# Patient Record
Sex: Female | Born: 1975
Health system: Southern US, Community
[De-identification: ages and names within clinical notes are randomized; demographics above are authoritative.]

## PROBLEM LIST (undated history)

## (undated) DIAGNOSIS — Z923 Personal history of irradiation: Secondary | ICD-10-CM

## (undated) DIAGNOSIS — B20 Human immunodeficiency virus [HIV] disease: Secondary | ICD-10-CM

## (undated) DIAGNOSIS — Z124 Encounter for screening for malignant neoplasm of cervix: Secondary | ICD-10-CM

## (undated) DIAGNOSIS — R87619 Unspecified abnormal cytological findings in specimens from cervix uteri: Secondary | ICD-10-CM

## (undated) DIAGNOSIS — Z21 Asymptomatic human immunodeficiency virus [HIV] infection status: Secondary | ICD-10-CM

## (undated) DIAGNOSIS — IMO0002 Reserved for concepts with insufficient information to code with codable children: Secondary | ICD-10-CM

## (undated) DIAGNOSIS — D649 Anemia, unspecified: Secondary | ICD-10-CM

## (undated) DIAGNOSIS — I10 Essential (primary) hypertension: Secondary | ICD-10-CM

## (undated) DIAGNOSIS — N189 Chronic kidney disease, unspecified: Secondary | ICD-10-CM

## (undated) HISTORY — DX: Unspecified abnormal cytological findings in specimens from cervix uteri: R87.619

## (undated) HISTORY — PX: TUBAL LIGATION: SHX77

## (undated) HISTORY — PX: COSMETIC SURGERY: SHX468

## (undated) HISTORY — DX: Encounter for screening for malignant neoplasm of cervix: Z12.4

## (undated) HISTORY — DX: Asymptomatic human immunodeficiency virus (hiv) infection status: Z21

## (undated) HISTORY — DX: Anemia, unspecified: D64.9

## (undated) HISTORY — DX: Human immunodeficiency virus (HIV) disease: B20

## (undated) HISTORY — DX: Reserved for concepts with insufficient information to code with codable children: IMO0002

## (undated) HISTORY — DX: Chronic kidney disease, unspecified: N18.9

---

## 2003-04-03 ENCOUNTER — Inpatient Hospital Stay (HOSPITAL_COMMUNITY): Admission: AD | Admit: 2003-04-03 | Discharge: 2003-04-03 | Payer: Self-pay | Admitting: Obstetrics & Gynecology

## 2004-10-01 ENCOUNTER — Ambulatory Visit: Payer: Self-pay | Admitting: Obstetrics and Gynecology

## 2004-11-07 ENCOUNTER — Ambulatory Visit: Payer: Self-pay | Admitting: Obstetrics and Gynecology

## 2004-11-07 ENCOUNTER — Other Ambulatory Visit: Admission: RE | Admit: 2004-11-07 | Discharge: 2004-11-07 | Payer: Self-pay | Admitting: Obstetrics and Gynecology

## 2005-07-29 ENCOUNTER — Emergency Department (HOSPITAL_COMMUNITY): Admission: EM | Admit: 2005-07-29 | Discharge: 2005-07-29 | Payer: Self-pay | Admitting: Emergency Medicine

## 2005-07-30 ENCOUNTER — Emergency Department (HOSPITAL_COMMUNITY): Admission: EM | Admit: 2005-07-30 | Discharge: 2005-07-30 | Payer: Self-pay | Admitting: Family Medicine

## 2006-08-27 ENCOUNTER — Ambulatory Visit: Payer: Self-pay | Admitting: Family Medicine

## 2006-08-28 ENCOUNTER — Ambulatory Visit (HOSPITAL_COMMUNITY): Admission: RE | Admit: 2006-08-28 | Discharge: 2006-08-28 | Payer: Self-pay | Admitting: Family Medicine

## 2006-08-31 ENCOUNTER — Ambulatory Visit: Payer: Self-pay | Admitting: Cardiovascular Disease

## 2006-09-10 ENCOUNTER — Ambulatory Visit: Payer: Self-pay | Admitting: Family Medicine

## 2006-09-24 ENCOUNTER — Ambulatory Visit: Payer: Self-pay | Admitting: Obstetrics & Gynecology

## 2006-10-09 ENCOUNTER — Ambulatory Visit: Payer: Self-pay | Admitting: Internal Medicine

## 2006-10-09 LAB — READ PPD: TB Skin Test: NEGATIVE mm

## 2006-10-12 ENCOUNTER — Ambulatory Visit: Payer: Self-pay | Admitting: Obstetrics & Gynecology

## 2006-10-13 ENCOUNTER — Ambulatory Visit (HOSPITAL_COMMUNITY): Admission: RE | Admit: 2006-10-13 | Discharge: 2006-10-13 | Payer: Self-pay | Admitting: Family Medicine

## 2006-10-19 ENCOUNTER — Ambulatory Visit: Payer: Self-pay | Admitting: Obstetrics & Gynecology

## 2006-10-19 DIAGNOSIS — B20 Human immunodeficiency virus [HIV] disease: Secondary | ICD-10-CM | POA: Insufficient documentation

## 2006-10-26 ENCOUNTER — Encounter: Payer: Self-pay | Admitting: Internal Medicine

## 2006-10-28 ENCOUNTER — Ambulatory Visit: Payer: Self-pay | Admitting: Gynecology

## 2006-11-09 ENCOUNTER — Ambulatory Visit: Payer: Self-pay | Admitting: Obstetrics & Gynecology

## 2006-11-10 ENCOUNTER — Ambulatory Visit: Payer: Self-pay

## 2006-11-10 ENCOUNTER — Encounter (INDEPENDENT_AMBULATORY_CARE_PROVIDER_SITE_OTHER): Payer: Self-pay | Admitting: Infectious Diseases

## 2006-11-10 ENCOUNTER — Ambulatory Visit: Payer: Self-pay | Admitting: Cardiovascular Disease

## 2006-11-10 ENCOUNTER — Ambulatory Visit: Payer: Self-pay | Admitting: Internal Medicine

## 2006-11-10 ENCOUNTER — Encounter: Admission: RE | Admit: 2006-11-10 | Discharge: 2006-11-10 | Payer: Self-pay | Admitting: Internal Medicine

## 2006-11-10 ENCOUNTER — Encounter: Payer: Self-pay | Admitting: Cardiology

## 2006-11-19 ENCOUNTER — Telehealth: Payer: Self-pay | Admitting: Internal Medicine

## 2006-11-23 ENCOUNTER — Ambulatory Visit: Payer: Self-pay | Admitting: Obstetrics & Gynecology

## 2006-12-07 ENCOUNTER — Ambulatory Visit: Payer: Self-pay | Admitting: Obstetrics & Gynecology

## 2006-12-21 ENCOUNTER — Ambulatory Visit: Payer: Self-pay | Admitting: Obstetrics & Gynecology

## 2006-12-28 ENCOUNTER — Ambulatory Visit: Payer: Self-pay | Admitting: Obstetrics & Gynecology

## 2006-12-30 ENCOUNTER — Encounter: Admission: RE | Admit: 2006-12-30 | Discharge: 2006-12-30 | Payer: Self-pay | Admitting: Internal Medicine

## 2006-12-30 ENCOUNTER — Ambulatory Visit: Payer: Self-pay | Admitting: Internal Medicine

## 2006-12-30 LAB — CONVERTED CEMR LAB
ALT: 8 units/L (ref 0–35)
AST: 17 units/L (ref 0–37)
Albumin: 2.9 g/dL — ABNORMAL LOW (ref 3.5–5.2)
Alkaline Phosphatase: 180 units/L — ABNORMAL HIGH (ref 39–117)
BUN: 4 mg/dL — ABNORMAL LOW (ref 6–23)
CD4 Count: 310 microliters
CO2: 18 meq/L — ABNORMAL LOW (ref 19–32)
Calcium: 8.4 mg/dL (ref 8.4–10.5)
Chloride: 107 meq/L (ref 96–112)
Creatinine, Ser: 0.59 mg/dL (ref 0.40–1.20)
Glucose, Bld: 102 mg/dL — ABNORMAL HIGH (ref 70–99)
Potassium: 3.6 meq/L (ref 3.5–5.3)
Sodium: 136 meq/L (ref 135–145)
Total Bilirubin: 0.5 mg/dL (ref 0.3–1.2)
Total Protein: 7.8 g/dL (ref 6.0–8.3)

## 2007-01-01 ENCOUNTER — Encounter: Payer: Self-pay | Admitting: Internal Medicine

## 2007-01-01 LAB — CONVERTED CEMR LAB
HIV 1 RNA Quant: 3650 copies/mL — ABNORMAL HIGH (ref <50)
HIV-1 RNA Quant, Log: 3.56 — ABNORMAL HIGH (ref <1.70)

## 2007-01-04 ENCOUNTER — Ambulatory Visit: Payer: Self-pay | Admitting: Family Medicine

## 2007-01-05 ENCOUNTER — Ambulatory Visit: Payer: Self-pay | Admitting: Obstetrics & Gynecology

## 2007-01-05 ENCOUNTER — Ambulatory Visit (HOSPITAL_COMMUNITY): Admission: RE | Admit: 2007-01-05 | Discharge: 2007-01-05 | Payer: Self-pay | Admitting: Family Medicine

## 2007-01-11 ENCOUNTER — Ambulatory Visit: Payer: Self-pay | Admitting: Obstetrics & Gynecology

## 2007-01-11 ENCOUNTER — Inpatient Hospital Stay (HOSPITAL_COMMUNITY): Admission: AD | Admit: 2007-01-11 | Discharge: 2007-01-15 | Payer: Self-pay | Admitting: Obstetrics & Gynecology

## 2007-01-11 ENCOUNTER — Telehealth: Payer: Self-pay | Admitting: Internal Medicine

## 2007-01-14 ENCOUNTER — Ambulatory Visit: Payer: Self-pay | Admitting: Infectious Diseases

## 2007-01-16 ENCOUNTER — Inpatient Hospital Stay (HOSPITAL_COMMUNITY): Admission: AD | Admit: 2007-01-16 | Discharge: 2007-01-16 | Payer: Self-pay | Admitting: Obstetrics and Gynecology

## 2007-01-16 ENCOUNTER — Ambulatory Visit: Payer: Self-pay | Admitting: *Deleted

## 2007-02-18 ENCOUNTER — Encounter: Payer: Self-pay | Admitting: Internal Medicine

## 2007-02-26 ENCOUNTER — Encounter: Payer: Self-pay | Admitting: Internal Medicine

## 2007-03-17 ENCOUNTER — Encounter (INDEPENDENT_AMBULATORY_CARE_PROVIDER_SITE_OTHER): Payer: Self-pay | Admitting: *Deleted

## 2007-04-07 ENCOUNTER — Encounter (INDEPENDENT_AMBULATORY_CARE_PROVIDER_SITE_OTHER): Payer: Self-pay | Admitting: *Deleted

## 2007-08-30 ENCOUNTER — Encounter: Payer: Self-pay | Admitting: Internal Medicine

## 2009-03-13 ENCOUNTER — Encounter: Payer: Self-pay | Admitting: Internal Medicine

## 2009-03-13 ENCOUNTER — Encounter (INDEPENDENT_AMBULATORY_CARE_PROVIDER_SITE_OTHER): Payer: Self-pay | Admitting: *Deleted

## 2009-09-20 ENCOUNTER — Emergency Department (HOSPITAL_COMMUNITY): Admission: EM | Admit: 2009-09-20 | Discharge: 2009-09-20 | Payer: Self-pay | Admitting: Emergency Medicine

## 2010-09-14 ENCOUNTER — Inpatient Hospital Stay (HOSPITAL_COMMUNITY)
Admission: AD | Admit: 2010-09-14 | Discharge: 2010-09-14 | Payer: Self-pay | Source: Home / Self Care | Attending: Obstetrics & Gynecology | Admitting: Obstetrics & Gynecology

## 2010-09-16 LAB — URINE CULTURE
Colony Count: NO GROWTH
Culture  Setup Time: 201201141607
Culture: NO GROWTH

## 2010-09-16 LAB — URINALYSIS, ROUTINE W REFLEX MICROSCOPIC
Bilirubin Urine: NEGATIVE
Hgb urine dipstick: NEGATIVE
Ketones, ur: NEGATIVE mg/dL
Leukocytes, UA: NEGATIVE
Nitrite: POSITIVE — AB
Protein, ur: NEGATIVE mg/dL
Specific Gravity, Urine: 1.02 (ref 1.005–1.030)
Urine Glucose, Fasting: NEGATIVE mg/dL
Urobilinogen, UA: 0.2 mg/dL (ref 0.0–1.0)
pH: 7 (ref 5.0–8.0)

## 2010-09-16 LAB — DIFFERENTIAL
Basophils Absolute: 0 10*3/uL (ref 0.0–0.1)
Basophils Relative: 0 % (ref 0–1)
Eosinophils Absolute: 0.4 10*3/uL (ref 0.0–0.7)
Eosinophils Relative: 10 % — ABNORMAL HIGH (ref 0–5)
Lymphocytes Relative: 27 % (ref 12–46)
Lymphs Abs: 1.2 10*3/uL (ref 0.7–4.0)
Monocytes Absolute: 0.5 10*3/uL (ref 0.1–1.0)
Monocytes Relative: 11 % (ref 3–12)
Neutro Abs: 2.3 10*3/uL (ref 1.7–7.7)
Neutrophils Relative %: 52 % (ref 43–77)

## 2010-09-16 LAB — WET PREP, GENITAL
Trich, Wet Prep: NONE SEEN
Yeast Wet Prep HPF POC: NONE SEEN

## 2010-09-16 LAB — URINE MICROSCOPIC-ADD ON

## 2010-09-16 LAB — CBC
HCT: 32.2 % — ABNORMAL LOW (ref 36.0–46.0)
Hemoglobin: 9.9 g/dL — ABNORMAL LOW (ref 12.0–15.0)
MCH: 23.2 pg — ABNORMAL LOW (ref 26.0–34.0)
MCHC: 30.7 g/dL (ref 30.0–36.0)
MCV: 75.4 fL — ABNORMAL LOW (ref 78.0–100.0)
Platelets: 239 10*3/uL (ref 150–400)
RBC: 4.27 MIL/uL (ref 3.87–5.11)
RDW: 14.4 % (ref 11.5–15.5)
WBC: 4.5 10*3/uL (ref 4.0–10.5)

## 2010-09-16 LAB — POCT PREGNANCY, URINE: Preg Test, Ur: NEGATIVE

## 2010-09-18 LAB — GC/CHLAMYDIA PROBE AMP, GENITAL
Chlamydia, DNA Probe: NEGATIVE
GC Probe Amp, Genital: NEGATIVE

## 2010-09-18 LAB — HSV 1 ANTIBODY, IGG: HSV 1 Glycoprotein G Ab, IgG: 8.74 IV — ABNORMAL HIGH

## 2010-09-18 LAB — HSV 2 ANTIBODY, IGG: HSV 2 Glycoprotein G Ab, IgG: 10.57 IV — ABNORMAL HIGH

## 2010-09-29 LAB — CONVERTED CEMR LAB
ALT: <8 units/L (ref 0–35)
AST: 15 units/L (ref 0–37)
Albumin: 2.8 g/dL — ABNORMAL LOW (ref 3.5–5.2)
Alkaline Phosphatase: 110 units/L (ref 39–117)
BUN: 5 mg/dL — ABNORMAL LOW (ref 6–23)
Basophils Absolute: 0 10*3/uL (ref 0.0–0.1)
Basophils Relative: 0 % (ref 0–1)
CD4 T Cell Abs: 300
CO2: 19 meq/L (ref 19–32)
Calcium: 8.3 mg/dL — ABNORMAL LOW (ref 8.4–10.5)
Chloride: 108 meq/L (ref 96–112)
Creatinine, Ser: 0.52 mg/dL (ref 0.40–1.20)
Eosinophils Absolute: 0.2 10*3/uL (ref 0.0–0.7)
Eosinophils Relative: 2 % (ref 0–5)
Glucose, Bld: 67 mg/dL — ABNORMAL LOW (ref 70–99)
HCT: 26 % — ABNORMAL LOW (ref 36.0–46.0)
HIV 1 RNA Quant: 12900 copies/mL — ABNORMAL HIGH (ref <50)
HIV-1 RNA Quant, Log: 4.11 — ABNORMAL HIGH (ref <1.70)
Hemoglobin: 7.4 g/dL — CL (ref 12.0–15.0)
Lymphocytes Relative: 24 % (ref 12–46)
Lymphs Abs: 2.1 10*3/uL (ref 0.7–3.3)
MCHC: 28.5 g/dL — ABNORMAL LOW (ref 30.0–36.0)
MCV: 65.7 fL — ABNORMAL LOW (ref 78.0–100.0)
Monocytes Absolute: 0.7 10*3/uL (ref 0.2–0.7)
Monocytes Relative: 9 % (ref 3–11)
Neutro Abs: 5.6 10*3/uL (ref 1.7–7.7)
Neutrophils Relative %: 65 % (ref 43–77)
Platelets: 258 10*3/uL (ref 150–400)
Potassium: 3.7 meq/L (ref 3.5–5.3)
RBC: 3.96 M/uL (ref 3.87–5.11)
RDW: 17.2 % — ABNORMAL HIGH (ref 11.5–14.0)
Sodium: 136 meq/L (ref 135–145)
Total Bilirubin: 0.3 mg/dL (ref 0.3–1.2)
Total Protein: 7.9 g/dL (ref 6.0–8.3)
WBC: 8.6 10*3/uL (ref 4.0–10.5)

## 2011-01-07 ENCOUNTER — Ambulatory Visit (INDEPENDENT_AMBULATORY_CARE_PROVIDER_SITE_OTHER): Payer: 59

## 2011-01-07 DIAGNOSIS — B20 Human immunodeficiency virus [HIV] disease: Secondary | ICD-10-CM

## 2011-01-07 LAB — CBC WITH DIFFERENTIAL/PLATELET
Basophils Absolute: 0 10*3/uL (ref 0.0–0.1)
Basophils Relative: 0 % (ref 0–1)
Eosinophils Absolute: 1.4 10*3/uL — ABNORMAL HIGH (ref 0.0–0.7)
Eosinophils Relative: 24 % — ABNORMAL HIGH (ref 0–5)
HCT: 30.9 % — ABNORMAL LOW (ref 36.0–46.0)
Hemoglobin: 9.4 g/dL — ABNORMAL LOW (ref 12.0–15.0)
Lymphocytes Relative: 16 % (ref 12–46)
Lymphs Abs: 1 10*3/uL (ref 0.7–4.0)
MCH: 22.2 pg — ABNORMAL LOW (ref 26.0–34.0)
MCHC: 30.4 g/dL (ref 30.0–36.0)
MCV: 73 fL — ABNORMAL LOW (ref 78.0–100.0)
Monocytes Absolute: 0.5 10*3/uL (ref 0.1–1.0)
Monocytes Relative: 8 % (ref 3–12)
Neutro Abs: 3.2 10*3/uL (ref 1.7–7.7)
Neutrophils Relative %: 53 % (ref 43–77)
Platelets: 289 10*3/uL (ref 150–400)
RBC: 4.23 MIL/uL (ref 3.87–5.11)
RDW: 15.6 % — ABNORMAL HIGH (ref 11.5–15.5)
WBC: 6 10*3/uL (ref 4.0–10.5)

## 2011-01-07 LAB — RPR

## 2011-01-08 LAB — URINALYSIS, MICROSCOPIC ONLY
Casts: NONE SEEN
Crystals: NONE SEEN
Squamous Epithelial / HPF: NONE SEEN

## 2011-01-08 LAB — URINALYSIS, ROUTINE W REFLEX MICROSCOPIC
Bilirubin Urine: NEGATIVE
Glucose, UA: NEGATIVE mg/dL
Hgb urine dipstick: NEGATIVE
Ketones, ur: NEGATIVE mg/dL
Nitrite: NEGATIVE
Protein, ur: NEGATIVE mg/dL
Specific Gravity, Urine: 1.02 (ref 1.005–1.030)
Urobilinogen, UA: 1 mg/dL (ref 0.0–1.0)
pH: 6 (ref 5.0–8.0)

## 2011-01-08 LAB — COMPLETE METABOLIC PANEL WITH GFR
ALT: 9 U/L (ref 0–35)
AST: 22 U/L (ref 0–37)
Albumin: 4.1 g/dL (ref 3.5–5.2)
Alkaline Phosphatase: 83 U/L (ref 39–117)
BUN: 8 mg/dL (ref 6–23)
CO2: 22 mEq/L (ref 19–32)
Calcium: 8.8 mg/dL (ref 8.4–10.5)
Chloride: 105 mEq/L (ref 96–112)
Creat: 0.54 mg/dL (ref 0.40–1.20)
GFR, Est African American: >60 mL/min (ref >60)
GFR, Est Non African American: >60 mL/min (ref >60)
Glucose, Bld: 73 mg/dL (ref 70–99)
Potassium: 3.5 mEq/L (ref 3.5–5.3)
Sodium: 135 mEq/L (ref 135–145)
Total Bilirubin: 0.3 mg/dL (ref 0.3–1.2)
Total Protein: 8.6 g/dL — ABNORMAL HIGH (ref 6.0–8.3)

## 2011-01-08 LAB — GC/CHLAMYDIA PROBE AMP, URINE
Chlamydia, Swab/Urine, PCR: NEGATIVE
GC Probe Amp, Urine: NEGATIVE

## 2011-01-08 LAB — HIV-1 RNA QUANT-NO REFLEX-BLD
HIV 1 RNA Quant: 133000 copies/mL — ABNORMAL HIGH (ref <20)
HIV-1 RNA Quant, Log: 5.12 {Log} — ABNORMAL HIGH (ref <1.30)

## 2011-01-08 LAB — T-HELPER CELL (CD4) - (RCID CLINIC ONLY)
CD4 % Helper T Cell: <1 % — ABNORMAL LOW (ref 33–55)
CD4 T Cell Abs: <10 uL — ABNORMAL LOW (ref 400–2700)

## 2011-01-14 NOTE — Discharge Summary (Signed)
NAMESHERIDEN, ARCHIBEQUE NO.:  [id number]   MEDICAL RECORD NO.:  [id number]          PATIENT TYPE:  WOC   LOCATION:  WOC                          FACILITY:  [facility]   PHYSICIAN:  Allie Bossier, MD        DATE OF BIRTH:  November 13, 1975   DATE OF ADMISSION:  01/11/2007  DATE OF DISCHARGE:  01/15/2007                               DISCHARGE SUMMARY   DISCHARGE DIAGNOSES:  1. Birth of a term female infant by repeat low transverse cesarean      section.  2. Undesired fertility, status post bilateral tubal ligation.  3. Human immunodeficiency virus.  4. Escherichia coli urinary tract infection.  5. Herpes on Pap smear.  6. Anemia.  7. Atypical squamous cells of undetermined significance on Pap smear.  8. 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   CONSULTS:  1. Infectious Disease.  2. Social Work.   PROCEDURES:  1. Repeat low transverse cesarean section.  2. Postpartum bilateral tubal ligation.   BRIEF ADMISSION HISTORY AND PHYSICAL:  This is a 35 year old G3, P1-1-0-  3 at 38 weeks, who was admitted for repeat low transverse C-section at  term.   HOSPITAL COURSE:  The patient was taken to the operating room and repeat  low transverse C-section with postpartum bilateral tubal ligation was  performed without complications and the patient delivered a viable  female infant.  Estimated blood loss was 600 mL.  Apgars were 8 and 9.  Placenta was sent to Pathology.  Prenatal course was remarkable for the  diagnoses noted above in discharge diagnoses section.  She was on  suppressive HIV treatment throughout her pregnancy.  Postop course was  remarkable for an E. coli UTI of greater than 100,000 colonies on a  clean catch and she was treated with 3 days of ciprofloxacin b.i.d.  She  is using ibuprofen and Percocet as needed for pain.  Postpartum  bilateral tubal ligation was for contraception.  Staples were removed at  discharge.  She is bottle-feeding.   The patient's hemoglobin post  procedure was 9.3 and the patient was asymptomatic.   OTHER LABORATORY DATA:  Blood type was O positive, antibody negative,  rubella immune.  Hepatitis B surface antigen negative.  RPR negative.  GC and Chlamydia negative.  One-hour glucose tolerance test 77.  GBS  positive.  HIV positive; CD4 count was 310 and viral load was 3650 on  April 30 and ID was following as an outpatient and also saw the patient  in the hospital and we will continue the Combivir and Kaletra.   DISCHARGE MEDICATIONS:  1. Ibuprofen 600 mg p.o. q.6 h. p.r.n. pain.  2. Percocet 5/325 one to two tabs p.o. q.4 h. p.r.n. pain.  3. Colace 100 mg p.o. daily.  4. Prenatal vitamin one daily for 6 weeks.  5. Combivir one tab p.o. b.i.d.  6. Kaletra two tabs p.o. b.i.d.  7. Ciprofloxacin 250 mg p.o. b.i.d. to complete a 3-day course.   FOLLOWUP:  At the health department in 6 weeks and with Infectious  Disease as previously scheduled.  ISSUES FOR FOLLOWUP:  1. HIV regimen.  2. Address having found herpes in the Pap smear and if the patient      needs to be on suppressive therapy.      Norton Blizzard, M.D.      Allie Bossier, MD  Electronically Signed    SH/MEDQ  D:  02/24/2007  T:  02/25/2007  Job:  [id number]   cc:   Rockey Situ. Flavia Shipper., M.D.  Fax: [phone]   Health Department

## 2011-01-14 NOTE — Op Note (Signed)
Briana [name], WHISNANT NO.:  [id number]   MEDICAL RECORD NO.:  [id number]          PATIENT TYPE:  INP   LOCATION:  9128                          [facility]   PHYSICIAN:  Allie Bossier, MD        DATE OF BIRTH:  08/09/1976   DATE OF PROCEDURE:  01/12/2007  DATE OF DISCHARGE:                               OPERATIVE REPORT   PREOPERATIVE DIAGNOSIS:  IUP at term, history of previous cesarean  section, HIV positive, history of peripartum cardiomyopathy, anemia,  undesired fertility.   POSTOPERATIVE DIAGNOSIS:  IUP at term, history of previous cesarean  section, HIV positive, history of peripartum cardiomyopathy, anemia,  undesired fertility.   PROCEDURE:  Repeat low transverse cesarean section with bilateral tubal  ligation with Filshie clips.   SURGEON:  Dr. Nicholaus Bloom   ASSISTANT:  Dr. Wilburt Finlay   ANESTHESIA:  Spinal.   SPECIMEN:  Placenta sent to labor and delivery.   ESTIMATED BLOOD LOSS:  600 mL.   COMPLICATIONS:  None.   FINDINGS:  Viable female infant, Apgars eight and nine at 1 and 5  minutes respectively.  Birth weight 6 pounds 13 ounces.   PROCEDURE:  The patient was taken to the operating room where her spinal  anesthesia was found to be adequate.  She was then prepped and draped in  the normal sterile fashion in the dorsal supine position with a leftward  tilt.  The Pfannenstiel skin incision was then made with a scalpel and  carried through to the underlying layer of fascia.  The fascia was  incised in the midline and the incision extended laterally with the Mayo  scissors.  The superior aspect of the fascial incision was then grasped  with Kocher clamps, elevated and underlying rectus muscles dissected off  bluntly.  Attention was then turned to the inferior aspect of the  incision which in a similar fashion was grasped, tented up with Kocher  clamps and the rectus muscles dissected off bluntly.  The rectus muscles  were then separated  in the midline and the right rectus muscles  partially transected to allow more room for delivery of the infant. The  peritoneum was identified, tented up and entered sharply with the  Metzenbaum scissors. The peritoneal incision was extended superiorly and  inferiorly with good visualization of the bladder. The bladder blade was  inserted and the lower uterine segment incised in a transverse fashion  with a scalpel. This incision was extended bluntly.  The bladder blade  was removed and the infant's head delivered atraumatically.  The nose  and mouth were suctioned and the cord clamped and cut.  The infant was  handed off to the waiting pediatricians. The placenta was then removed  manually.  The uterus cleared of all clots and debris.  The uterine  incision was repaired with a 0 Vicryl in a running locked fashion. A  second layer using the same suture was used to obtain excellent  hemostasis. Attention was then turned to the tubal ligation.  The uterus  was exteriorized.  The patient's right fallopian tube  was identified,  grasped with the Babcock clamp and a Filshie clip placed in the isthmus  region of the tube. Good hemostasis was noted.  The patient's left  fallopian tube was identified, grasped with a Babcock clamp and the  Filshie clip was placed in the isthmus region of the tube.  The uterus  was returned to the abdomen.  The uterine incision was reinspected  several times with excellent hemostasis noted.  The muscles were checked  for bleeding and excellent hemostasis was noted. The fascia was  reapproximated with the 0 PDS in a running fashion.  The skin was closed  with staples.  The patient tolerated the procedure well.  Sponge, lap  and needle counts were correct x2.  1 gram of Ancef was given at cord  clamp.  The patient was taken to the recovery room in stable condition.     ______________________________  Paticia Stack, MD      Allie Bossier, MD  Electronically  Signed    LNJ/MEDQ  D:  01/12/2007  T:  01/12/2007  Job:  [id number]

## 2011-01-17 NOTE — Letter (Signed)
August 31, 2006    Briana [name]. Shawnie Pons, M.D.  [facility]  [place], [place] [address]   RE:  Briana, [name]  MRN:  [id number]  /  DOB:  [birth year]   Dear Dr. Shawnie Pons,   It was my pleasure to see Briana [name] at the [facility]  [facility] on August 31, 2006. As you know, Briana [name] is a very nice 35-  year-old woman who presents for evaluation of cardiomyopathy as she has  had pregnancy related congestive heart failure in the past and is now at  [redacted] weeks gestation with her fourth child.   Briana [name] is doing well from a cardiovascular standpoint at present.  She denies chest pain, dyspnea, light-headedness, syncope, orthopnea,  PND or palpitations. She has no edema. She walks regularly at work  during her breaks for 15 minutes at a time and has no symptoms with  exercise. She works as a Patent examiner.   She reports that during her second pregnancy at the time of delivery  twins, she developed shortness of breath. In reviewing the notes, it  appears that she developed acute pulmonary edema and she was diagnosed  with congestive heart failure at the time. It sounds like she was  briefly on medical therapy for congestive heart failure but did not take  medications for more than a few months as she lost her insurance and was  unable to obtain them. She has not had regular followup until now. An  echocardiogram done at the time of her delivery back in April 2000  demonstrated a mildly enlarged left ventricle with global hypokinesis  and an ejection fraction estimated in the range of 40-45%. She had mild  left atrial enlargement and a trivial pericardial effusion at the time.   PAST MEDICAL HISTORY:  1. Pregnancy induced cardiomyopathy. See details above.  2. Anemia.  3. HIV. The patient reports testing positive for HIV and being      followed regularly. She has never been on regular antiretroviral      therapy. She reports that she has taken AZT at the  time of her      deliveries in the past.   SOCIAL HISTORY:  The patient is single, one age 67 and 88-year-old twins.  She lives in [place]. She works at the court house. She does not  smoke cigarettes, drink alcohol or use illicit drugs. She drinks 1 or 2  caffeinated beverages daily. She exercises as described above.   FAMILY HISTORY:  There is no family history of coronary artery disease  or congestive heart failure.   MEDICATIONS:  Prenatal vitamin.   ALLERGIES:  NKDA.   REVIEW OF SYSTEMS:  A complete 12-point review of systems was performed  and there were no pertinent positives to report.   PHYSICAL EXAMINATION:  GENERAL:  The patient is alert and oriented, she  is in no acute distress.  VITAL SIGNS:  Her blood pressure is 133/81, heart rate is 97,  respiratory rate is 12.  HEENT:  Normal.  NECK:  Normal carotid upstrokes without bruits. Jugular venous pressure  is normal. There is no thyromegaly or thyroid nodules.  LUNGS:  Clear to auscultation bilaterally.  CARDIOVASCULAR:  The apex is discreet and nondisplaced. The heart is  regular rate and rhythm with a 2/6 ejection murmur at the left sternal  border. There are no gallops or diastolic murmurs present.  ABDOMEN:  Soft, gravid, nontender, no organomegaly. No abdominal bruits  are  present.  EXTREMITIES:  There is no clubbing, cyanosis or edema. Peripheral pulses  are 2+ and equal throughout.  SKIN:  Warm and dry without rash.  LYMPHATICS:  There is no adenopathy.  MUSCULOSKELETAL:  There are no joint deformities or effusions.   EKG shows normal sinus rhythm and is within normal limits. Recent labs  from December 4 show hemoglobin of 8.8, hematocrit of 27.9. The MCV is  67 and the MCH is 21.   Echocardiogram as described above from back in 2000. Chest x-ray at that  time also showed cardiomegaly with mild increase in her pulmonary  vascularity.   ASSESSMENT:  Briana [name] is a 35 year old African-American woman  with a  history of peripartum cardiomyopathy. She developed pulmonary edema at  the time of delivery and appears to have had no cardiac sequelae since  that time. She had documented mild reduction in left ventricular  function but again has been asymptomatic since that time.   Briana [name] main risk at this time involves recurrent congestive heart  failure and progressive left ventricular dysfunction with pregnancy. I  reviewed the risks of this with her in detail today. She currently has  no signs or symptoms that suggest heart failure but as you know this  typically occurs during the third trimester of pregnancy. I would like  to recheck an echocardiogram to assess her left ventricular function at  this point. I would not recommend putting her on any therapy at present  in the setting of her pregnancy and asymptomatic status. If she has  depressed left ventricular function, I think she would benefit from the  addition of a beta blocker. Will withhold this until we see the results  of her echocardiogram. I would like to see her back in approximately 8  weeks at the peak of her hemodynamic load of pregnancy near the end of  her second trimester. Will make further recommendations at that time.   Regarding mode of delivery, I will leave that up to your expertise. That  decision will be easier once we see how Briana [name] does from a clinical  standpoint as she advances in her pregnancy and gets closer to delivery.  I once again stress the importance of no further pregnancies due to the  high risk in the future or developing progressive problems with  congestive heart failure.   Thanks again for the opportunity to see Briana [name]. Please feel free to  contact me at any time with questions regarding her care.    Sincerely,      Veverly Fells. Excell Seltzer, MD  Electronically Signed    MDC/MedQ  DD: 08/31/2006  DT: 08/31/2006  Job #: [id number]

## 2011-01-17 NOTE — Group Therapy Note (Signed)
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 On examination  the patient looks like a good candidate for a LEEP procedure so she was  brought back today to do that.   The procedure was done with the patient in the dorsal lithotomy position,  the cervix isolated with an insulated side-guard speculum, injected in four  quadrants with 1 mL each of 1% Xylocaine with 1:100,000 epinephrine, and  using 2 x 1 cm loop, a LEEP conization was taken with a blended current of  65. Ball current of 50 was used for coag to control bleeding and this area  was treated once again with Monsel's postoperatively. The entire area around  the biopsy site was coagulated with hot cautery ball to remove any tissue  that was affected that we may have missed. There was minimal bleeding during  the procedure. The patient tolerated the procedure well and the speculum was  removed from the vagina, and the patient was discharged to return in 2 weeks  for examination. The plan is to repeat Pap smear in 4 months. If okay, in 6  months on two occasions and then back to 1 year.      PR/MEDQ  D:  11/07/2004  T:  11/07/2004  Job:  [id number]

## 2011-01-17 NOTE — Assessment & Plan Note (Signed)
[facility]                            CARDIOLOGY OFFICE NOTE   NAME:CROSBYEleanora, [name]                       MRN:          [id number]  DATE:11/10/2006                            DOB:          Mar 09, 1976    Briana [name] was seen in followup at the [facility] on  November 10, 2006.  She is a 35 year old woman, who is currently beginning  her third trimester of pregnancy, who has a past history of peripartum  cardiomyopathy and congestive heart failure.  Briana [name] developed  shortness of breath during her second pregnancy and had acute pulmonary  edema.  She underwent an echocardiogram at that time that demonstrated  global hypokinesis of the left ventricle with an ejection fraction in  the range of 40-45%.  In the interim, she has done very well and has not  had any cardiac symptoms.  She has not been on any medications for  several years.   She is currently feeling well with her pregnancy.  She has no  complaints.  She specifically denies chest pain, dyspnea,  lightheadedness, syncope or edema.  She tells me that her due date is  May 27th.   MEDICATIONS:  Prenatal vitamin, AZT and Kaletra.   ALLERGIES:  NKDA.   EXAMINATION:  The patient is alert and oriented.  She is in no acute  distress.  Weight is 166 pounds, blood pressure is 116/68, heart rate is  90, respiratory rate 16.  HEENT:  Normal.  NECK:  Normal carotid upstrokes without bruits.  Jugular venous pressure  is normal.  LUNGS:  Clear to auscultation bilaterally.  HEART:  Regular rate and rhythm with a dynamic LV apex and a 2/6  ejection murmur along the right upper sternal border.  There are no  gallops or diastolic murmurs.  ABDOMEN:  Soft, gravid, nontender, no organomegaly is appreciated.  EXTREMITIES:  No clubbing, cyanosis or edema.  Peripheral pulses are 2+  and equal throughout.   A transthoracic echocardiogram was performed in the office today.  This  demonstrated normal left ventricular function with an estimated LVEF of  60%, there were no regional wall motion abnormalities, there was no  valvular disease.  The left atrial size was at the upper limits of  normal.   ASSESSMENT:  Briana [name] is currently stable from a cardiac standpoint  with her history of peripartum cardiomyopathy.   She appears to have no residual sequelae either from a symptomatic  standpoint or via any structural heart disease.  Her LV function is  normal.  I do not think she requires any cardiac medications at this  point.  She is currently at her peak hemodynamic load of pregnancy and  appears to be tolerating her pregnancy well.  I will not plan on seeing  her back in clinic unless she develops any problems.  If she develops  symptoms, I certainly would be happy to see her at any time.   I think it will be of utmost importance to watch her blood pressure as  her pregnancy progresses and she nears her due date,  as it is possible  that her development of congestive heart failure was related to  hypertension, although the details of her episode are not completely  clear.     Veverly Fells. Excell Seltzer, MD  Electronically Signed    MDC/MedQ  DD: 11/10/2006  DT: 11/12/2006  Job #: [id number]   cc:   Tinnie Gens

## 2011-01-20 NOTE — Progress Notes (Signed)
Pt is returning to care after being without medical insurance for 3 years. She thought she was not able to attend clinic after her pregnancy Medicaid expired.  She has been without [facility] for 3 years. She is now employed and has Programmer, applications. I informed the patient we see our HIV patients and they are programs that would have assisted her with medications and office visits.  . She is eager to resume treatment.  Bridge Counselor referral was submitted after pt was lost to care. Previous Dr Philipp Deputy patient.  Lab data available from past visits therefore there will be labs that I will not repeat.

## 2011-01-23 ENCOUNTER — Encounter: Payer: Self-pay | Admitting: Internal Medicine

## 2011-01-23 ENCOUNTER — Ambulatory Visit (INDEPENDENT_AMBULATORY_CARE_PROVIDER_SITE_OTHER): Payer: 59 | Admitting: Internal Medicine

## 2011-01-23 VITALS — BP 137/89 | HR 85 | Temp 98.1°F | Ht 63.0 in | Wt 160.5 lb

## 2011-01-23 DIAGNOSIS — Z23 Encounter for immunization: Secondary | ICD-10-CM

## 2011-01-23 DIAGNOSIS — D649 Anemia, unspecified: Secondary | ICD-10-CM

## 2011-01-23 DIAGNOSIS — D509 Iron deficiency anemia, unspecified: Secondary | ICD-10-CM | POA: Insufficient documentation

## 2011-01-23 DIAGNOSIS — B20 Human immunodeficiency virus [HIV] disease: Secondary | ICD-10-CM

## 2011-01-23 LAB — IRON AND TIBC
%SAT: 4 % — ABNORMAL LOW (ref 20–55)
TIBC: 398 ug/dL (ref 250–470)

## 2011-01-23 LAB — FOLATE: Folate: 9.2 ng/mL

## 2011-01-23 MED ORDER — DARUNAVIR ETHANOLATE 400 MG PO TABS: 800.0000 mg | ORAL_TABLET | Freq: Every day | ORAL | Status: DC

## 2011-01-23 MED ORDER — AZITHROMYCIN 600 MG PO TABS: 1200.0000 mg | ORAL_TABLET | ORAL | Status: DC

## 2011-01-23 MED ORDER — EMTRICITABINE-TENOFOVIR DF 200-300 MG PO TABS: 1.0000 | ORAL_TABLET | Freq: Every day | ORAL | Status: DC

## 2011-01-23 MED ORDER — RITONAVIR 100 MG PO TABS: 100.0000 mg | ORAL_TABLET | Freq: Every day | ORAL | Status: DC

## 2011-01-23 MED ORDER — SULFAMETHOXAZOLE-TRIMETHOPRIM 800-160 MG PO TABS: 1.0000 | ORAL_TABLET | Freq: Two times a day (BID) | ORAL | Status: DC

## 2011-01-23 NOTE — Progress Notes (Signed)
Addended by: Jennet Maduro on: 01/23/2011 10:23 AM   Modules accepted: Orders

## 2011-01-23 NOTE — Assessment & Plan Note (Signed)
Will check anemia panel

## 2011-01-23 NOTE — Progress Notes (Signed)
  Subjective:    Patient ID: Briana [name], female    DOB: 1976-01-17, 35 y.o.   MRN: [id number]   HPI 35 yo female with hx of HIV/AIDS previously in treatment x 3 for each of her three pregnancies but did not continue after delivery each time.  Returns today to reestablish care.  Previously on Combivir/Kaletra each pregnancy.  Kids are all HIV negative.  No hx of mutations.  Nadir at current level <10.  Pt with complaint of pruritic lesions all over, otherwise no issues.  Pt with BTL after last pregnancy.  No OIs since last seen.  Feels otherwise well.  Last PAP "a while ago".  No hx abnormal.      Review of Systems  Constitutional: Negative for fever, activity change, appetite change, fatigue and unexpected weight change.  HENT: Negative.  Negative for facial swelling.   Respiratory: Negative.   Cardiovascular: Negative.   Gastrointestinal: Negative.   Genitourinary: Negative.   Musculoskeletal: Negative.   Neurological: Negative.   Hematological: Negative.   Psychiatric/Behavioral: Negative.        Objective:   Physical Exam  Constitutional: She is oriented to person, place, and time. She appears well-developed and well-nourished. No distress.  HENT:  Nose: Nose normal.  Mouth/Throat: Oropharynx is clear and moist. No oropharyngeal exudate.  Eyes: Right eye exhibits no discharge. No scleral icterus.  Neck: Neck supple. No thyromegaly present.  Cardiovascular: Normal rate and regular rhythm.  Exam reveals no gallop and no friction rub.   No murmur heard. Pulmonary/Chest: Effort normal and breath sounds normal. No respiratory distress. She has no wheezes. She has no rales.  Abdominal: Soft. Bowel sounds are normal. She exhibits no distension. There is no tenderness.  Musculoskeletal: Normal range of motion.  Lymphadenopathy:    She has no cervical adenopathy.  Neurological: She is alert and oriented to person, place, and time.  Skin: Skin is dry. Rash noted. No erythema.    + pruritic papular, diffuse lesions, no erythema, on legs, arms, back.    Psychiatric: She has a normal mood and affect. Her behavior is normal. Thought content normal.          Assessment & Plan:

## 2011-01-23 NOTE — Assessment & Plan Note (Signed)
Restablishing care.  Needs ADAP.  Will have her get bactrim and azith for 4$ in meantime.  To start Darunavir boosted and truvada with concern for intermittent care, though seems motivated now.  AFB culture today.  Genotype added on.  Pneumovax, hep b series today.  RTC 6 weeks.

## 2011-01-24 ENCOUNTER — Other Ambulatory Visit (HOSPITAL_COMMUNITY)
Admission: RE | Admit: 2011-01-24 | Discharge: 2011-01-24 | Disposition: A | Discharge status: Home / Self Care | Payer: 59 | Source: Ambulatory Visit | Attending: Internal Medicine | Admitting: Internal Medicine

## 2011-01-24 ENCOUNTER — Ambulatory Visit (INDEPENDENT_AMBULATORY_CARE_PROVIDER_SITE_OTHER): Payer: 59 | Admitting: *Deleted

## 2011-01-24 DIAGNOSIS — Z124 Encounter for screening for malignant neoplasm of cervix: Secondary | ICD-10-CM

## 2011-01-24 DIAGNOSIS — Z01419 Encounter for gynecological examination (general) (routine) without abnormal findings: Secondary | ICD-10-CM | POA: Insufficient documentation

## 2011-01-24 DIAGNOSIS — Z8742 Personal history of other diseases of the female genital tract: Secondary | ICD-10-CM | POA: Insufficient documentation

## 2011-01-24 DIAGNOSIS — B20 Human immunodeficiency virus [HIV] disease: Secondary | ICD-10-CM

## 2011-01-24 DIAGNOSIS — R87610 Atypical squamous cells of undetermined significance on cytologic smear of cervix (ASC-US): Secondary | ICD-10-CM

## 2011-01-24 NOTE — Progress Notes (Signed)
  Subjective:    Patient ID: Kyla Balzarine, female    DOB: Oct 03, 1975, 35 y.o.   MRN: [id number]  HPI    Review of Systems     Objective:   Physical Exam        Assessment & Plan:   Subjective:     JOHAN ANTONACCI is a 35 y.o. woman who comes in today for a  pap smear only.  Previous abnormal Pap smears: No. Contraception: BTL.  Review of Systems   Objective:    LMP 01/13/2011 Pelvic Exam: . Pap smear obtained.   Assessment:    Screening pap smear.   Plan:    Follow up in a year, or as indicated by Pap results.  Pt given educational materials re:  HIV and women, diet, exercise, nutrition, BSE and self-esteem.

## 2011-01-24 NOTE — Patient Instructions (Addendum)
Your results will be ready in about a week.  They will be mailed to you.  Thank you for coming to the Center for your care.

## 2011-01-28 LAB — GLUCOSE 6 PHOSPHATE DEHYDROGENASE: G-6-PD, Quant: 20.5 U/GM HGB (ref 7.0–20.5)

## 2011-01-29 ENCOUNTER — Telehealth: Payer: Self-pay | Admitting: *Deleted

## 2011-01-29 ENCOUNTER — Encounter: Payer: Self-pay | Admitting: *Deleted

## 2011-01-29 ENCOUNTER — Ambulatory Visit: Payer: 59

## 2011-01-29 DIAGNOSIS — R87613 High grade squamous intraepithelial lesion on cytologic smear of cervix (HGSIL): Secondary | ICD-10-CM

## 2011-01-29 DIAGNOSIS — IMO0002 Reserved for concepts with insufficient information to code with codable children: Secondary | ICD-10-CM

## 2011-01-29 NOTE — Telephone Encounter (Signed)
Pt scheduled for referral to [facility] appt March 10, 2011 @ 1445.

## 2011-01-30 NOTE — Telephone Encounter (Signed)
Letter with appt information mailed to pt.  Phone call to pt to discuss appt for abnormal PAP smear results.  Message left with appt information.  Jennet Maduro, RN.

## 2011-01-31 LAB — HIV-1 GENOTYPR PLUS

## 2011-02-20 ENCOUNTER — Other Ambulatory Visit (INDEPENDENT_AMBULATORY_CARE_PROVIDER_SITE_OTHER): Payer: 59

## 2011-02-20 ENCOUNTER — Other Ambulatory Visit: Payer: Self-pay | Admitting: Internal Medicine

## 2011-02-20 DIAGNOSIS — B20 Human immunodeficiency virus [HIV] disease: Secondary | ICD-10-CM

## 2011-02-21 LAB — CBC WITH DIFFERENTIAL/PLATELET
Basophils Absolute: 0 10*3/uL (ref 0.0–0.1)
Basophils Relative: 0 % (ref 0–1)
Eosinophils Absolute: 1.6 10*3/uL — ABNORMAL HIGH (ref 0.0–0.7)
Eosinophils Relative: 19 % — ABNORMAL HIGH (ref 0–5)
HCT: 31.6 % — ABNORMAL LOW (ref 36.0–46.0)
MCH: 21.4 pg — ABNORMAL LOW (ref 26.0–34.0)
MCHC: 28.2 g/dL — ABNORMAL LOW (ref 30.0–36.0)
MCV: 76.1 fL — ABNORMAL LOW (ref 78.0–100.0)
Monocytes Absolute: 0.5 10*3/uL (ref 0.1–1.0)
RDW: 15.4 % (ref 11.5–15.5)

## 2011-02-21 LAB — COMPLETE METABOLIC PANEL WITH GFR
AST: 20 U/L (ref 0–37)
Albumin: 3.7 g/dL (ref 3.5–5.2)
Alkaline Phosphatase: 83 U/L (ref 39–117)
BUN: 8 mg/dL (ref 6–23)
CO2: 23 mEq/L (ref 19–32)
Chloride: 105 mEq/L (ref 96–112)
Glucose, Bld: 63 mg/dL — ABNORMAL LOW (ref 70–99)
Total Protein: 8.6 g/dL — ABNORMAL HIGH (ref 6.0–8.3)

## 2011-02-21 LAB — HIV-1 RNA QUANT-NO REFLEX-BLD: HIV-1 RNA Quant, Log: 4.08 {Log} — ABNORMAL HIGH (ref <1.30)

## 2011-02-24 ENCOUNTER — Other Ambulatory Visit: Payer: Self-pay | Admitting: *Deleted

## 2011-02-24 ENCOUNTER — Other Ambulatory Visit: Payer: Self-pay | Admitting: Internal Medicine

## 2011-02-24 DIAGNOSIS — D649 Anemia, unspecified: Secondary | ICD-10-CM

## 2011-02-24 MED ORDER — FERROUS SULFATE 324 (65 FE) MG PO TBEC: 324.0000 mg | DELAYED_RELEASE_TABLET | Freq: Two times a day (BID) | ORAL | Status: DC

## 2011-03-06 ENCOUNTER — Encounter: Payer: Self-pay | Admitting: Internal Medicine

## 2011-03-06 ENCOUNTER — Other Ambulatory Visit: Payer: Self-pay | Admitting: *Deleted

## 2011-03-06 ENCOUNTER — Ambulatory Visit (INDEPENDENT_AMBULATORY_CARE_PROVIDER_SITE_OTHER): Payer: 59 | Admitting: Internal Medicine

## 2011-03-06 VITALS — BP 138/80 | HR 80 | Temp 98.2°F | Ht 65.0 in | Wt 157.0 lb

## 2011-03-06 DIAGNOSIS — R87613 High grade squamous intraepithelial lesion on cytologic smear of cervix (HGSIL): Secondary | ICD-10-CM

## 2011-03-06 DIAGNOSIS — B20 Human immunodeficiency virus [HIV] disease: Secondary | ICD-10-CM

## 2011-03-06 DIAGNOSIS — IMO0002 Reserved for concepts with insufficient information to code with codable children: Secondary | ICD-10-CM

## 2011-03-06 DIAGNOSIS — Z23 Encounter for immunization: Secondary | ICD-10-CM

## 2011-03-06 MED ORDER — SULFAMETHOXAZOLE-TMP DS 800-160 MG PO TABS: 1.0000 | ORAL_TABLET | Freq: Every day | ORAL | Status: DC

## 2011-03-06 MED ORDER — AZITHROMYCIN 600 MG PO TABS: 1200.0000 mg | ORAL_TABLET | ORAL | Status: DC

## 2011-03-06 NOTE — Assessment & Plan Note (Signed)
Doing well on regimen and appropriate response with VL and CD4 count after just about 1 week.  Will recheck again after 1 month to assure complete or nearly complete suppression.  Counselled on compliance and the patient reports no missed doses.  Has not though started Bactrim prophylaxis and so will start today and continue Azithromycin weekly.  Counselled on continued adherence and to let us know of any difficulties.

## 2011-03-06 NOTE — Progress Notes (Signed)
  Subjective:    Patient ID: Briana [name], female    DOB: [birth year], 35 y.o.   MRN: [id number]  HPIHere for follow up after starting Prezista, boosted and Truvada.  Has a history of treatment during pregnancies with Kaletra and Combivir but had stopped after delivery all 3 times.  Now established with [facility] and started her meds about 1 week prior to labs.  Tolerating well with no side effects except minimal side effects from weekly Azithromycin.  Recent PAP and going to [facility] on the 9th.  No fever, no ED visits.  Feels well otherwise.      Review of Systems  Constitutional: Negative.   HENT: Negative.   Eyes: Negative.   Respiratory: Negative.   Cardiovascular: Negative.   Gastrointestinal: Negative.   Genitourinary: Negative.   Musculoskeletal: Negative.   Skin: Negative.   Neurological: Negative.   Hematological: Negative.   Psychiatric/Behavioral: Negative.        Objective:   Physical Exam  Constitutional: She is oriented to person, place, and time. She appears well-developed and well-nourished. No distress.  HENT:  Head: Normocephalic.  Mouth/Throat: No oropharyngeal exudate.  Eyes: Right eye exhibits no discharge. Left eye exhibits no discharge. No scleral icterus.  Neck: Normal range of motion. Neck supple. No JVD present.  Cardiovascular: Normal rate and regular rhythm.   Murmur heard. Pulmonary/Chest: Effort normal and breath sounds normal. No respiratory distress. She has no wheezes. She has no rales.  Abdominal: Soft. Bowel sounds are normal. She exhibits no distension. There is no tenderness.  Lymphadenopathy:    She has no cervical adenopathy.  Neurological: She is alert and oriented to person, place, and time.  Skin: Skin is warm and dry. No rash noted. No erythema.  Psychiatric: She has a normal mood and affect. Her behavior is normal.          Assessment & Plan:

## 2011-03-06 NOTE — Assessment & Plan Note (Signed)
Has appt scheduled.

## 2011-03-10 ENCOUNTER — Ambulatory Visit (INDEPENDENT_AMBULATORY_CARE_PROVIDER_SITE_OTHER): Payer: 59 | Admitting: Family Medicine

## 2011-03-10 ENCOUNTER — Other Ambulatory Visit (HOSPITAL_COMMUNITY)
Admission: RE | Admit: 2011-03-10 | Discharge: 2011-03-10 | Disposition: A | Discharge status: Home / Self Care | Payer: PRIVATE HEALTH INSURANCE | Source: Ambulatory Visit | Attending: Family Medicine | Admitting: Family Medicine

## 2011-03-10 ENCOUNTER — Encounter: Payer: Self-pay | Admitting: Family Medicine

## 2011-03-10 ENCOUNTER — Telehealth: Payer: Self-pay | Admitting: *Deleted

## 2011-03-10 VITALS — BP 133/93 | HR 87 | Temp 99.7°F | Ht 65.0 in | Wt 156.9 lb

## 2011-03-10 DIAGNOSIS — Z01812 Encounter for preprocedural laboratory examination: Secondary | ICD-10-CM

## 2011-03-10 DIAGNOSIS — R87613 High grade squamous intraepithelial lesion on cytologic smear of cervix (HGSIL): Secondary | ICD-10-CM

## 2011-03-10 DIAGNOSIS — N871 Moderate cervical dysplasia: Secondary | ICD-10-CM | POA: Insufficient documentation

## 2011-03-10 LAB — POCT PREGNANCY, URINE: Preg Test, Ur: NEGATIVE

## 2011-03-10 NOTE — Telephone Encounter (Signed)
She did not go to her appt 03/10/11 at 2:45

## 2011-03-10 NOTE — Patient Instructions (Signed)
  Colposcopy Care After Colposcopy is a procedure in which a special tool is used to magnify the surface of the cervix. A tissue sample (biopsy) may also be taken. This sample will be used to detect cancer of the cervix or other problems. These procedures may be done after a pelvic exam shows a possible problem.  AFTER THE TEST  You may have some cramping.   Lie down for a few minutes if you feel light headed.   You may have some bleeding which should stop in a few days.  HOME CARE  Do not have sex or use tampons for 2-3 days or as directed.   Only take medicine as directed by your doctor.   If you are on birth control pills, continue to take your pills the way you normally do.   To protect your privacy, test results cannot be given over the phone. Make sure you get the results of your test. Ask how you will get the results if you have not been told. Do not assume everything is normal if you have not heard from your doctor. Follow up on all of your test results.  GET HELP RIGHT AWAY IF YOU:  Are bleeding heavily or passing blood clots.   Develop a fever of 102F (38.9C) or higher.   Have abnormal vaginal discharge.   Have cramps that do not go away after medicine.   Feel light headed, dizzy or faint.  MAKE SURE YOU:   Understand these instructions.   Will watch your condition.   Will get help right away if you are not doing well or get worse.   Please return in 2 weeks to discuss your results.  Document Released: 02/04/2008 Document Re-Released: 06/15/2009 [facility] Patient Information 2011 [place], [place].

## 2011-03-10 NOTE — Progress Notes (Signed)
  Colposcopy Procedure Note  Indications: This is a 35 yo G3P3004 with PMH of HIV presenting with abnormal pap-HSIL-CIN2/CIN3 on 01/24/11 for colposcopy. Pap smear 2 months ago showed: high-grade squamous intraepithelial neoplasia  (HGSIL-encompassing moderate and severe dysplasia). The prior pap showed unsure.  Prior cervical/vaginal disease: previous colposcopy without LEEP or cone or other surgeries. Prior cervical treatment: no treatment.  PSurgHx: prev C/S x2 with BTL, UPT neg today.   Procedure Details  The risks and benefits of the procedure and Written informed consent obtained.  Speculum placed in vagina and excellent visualization of cervix achieved, cervix swabbed x 3 with acetic acid solution.  Findings: Cervix: punctation noted at 12 o'clock, abnormal vessels noted at 3 o'clock and suspicious for carcinoma at 9 o'clock; endocervical speculum placed, endocervical curettage performed, cervical biopsies taken at 3 o'clock, 12 o'clock and 9 o'clock, specimen labelled and sent to pathology and hemostasis achieved with Monsel's solution. Vaginal inspection: vaginal colposcopy not performed. Vulvar colposcopy: vulvar colposcopy not performed.  Specimens: cervical biopsies and ECC  Complications: none.  Plan: Specimens labelled and sent to Pathology. Will base further treatment on Pathology findings. Post biopsy instructions given to patient. Return to discuss Pathology results in 2 weeks.

## 2011-03-10 NOTE — Progress Notes (Signed)
UPT:negative

## 2011-03-11 ENCOUNTER — Other Ambulatory Visit: Payer: Self-pay | Admitting: Family Medicine

## 2011-03-11 NOTE — Progress Notes (Signed)
Addended by: Haze Boyden on: 03/11/2011 11:25 AM   Modules accepted: Orders

## 2011-04-01 ENCOUNTER — Other Ambulatory Visit: Payer: Self-pay | Admitting: Internal Medicine

## 2011-04-01 ENCOUNTER — Other Ambulatory Visit: Payer: 59

## 2011-04-01 DIAGNOSIS — B20 Human immunodeficiency virus [HIV] disease: Secondary | ICD-10-CM

## 2011-04-02 LAB — COMPLETE METABOLIC PANEL WITH GFR
ALT: 12 U/L (ref 0–35)
Albumin: 3.7 g/dL (ref 3.5–5.2)
CO2: 21 mEq/L (ref 19–32)
GFR, Est African American: >60 mL/min (ref >60)
GFR, Est Non African American: >60 mL/min (ref >60)
Glucose, Bld: 71 mg/dL (ref 70–99)
Potassium: 3.9 mEq/L (ref 3.5–5.3)
Sodium: 135 mEq/L (ref 135–145)
Total Bilirubin: 0.2 mg/dL — ABNORMAL LOW (ref 0.3–1.2)
Total Protein: 8.9 g/dL — ABNORMAL HIGH (ref 6.0–8.3)

## 2011-04-02 LAB — CBC WITH DIFFERENTIAL/PLATELET
Basophils Absolute: 0 10*3/uL (ref 0.0–0.1)
Basophils Relative: 1 % (ref 0–1)
Eosinophils Absolute: 0.4 10*3/uL (ref 0.0–0.7)
HCT: 31.4 % — ABNORMAL LOW (ref 36.0–46.0)
Hemoglobin: 9.2 g/dL — ABNORMAL LOW (ref 12.0–15.0)
MCH: 21 pg — ABNORMAL LOW (ref 26.0–34.0)
MCHC: 29.3 g/dL — ABNORMAL LOW (ref 30.0–36.0)
Monocytes Absolute: 0.4 10*3/uL (ref 0.1–1.0)
Monocytes Relative: 7 % (ref 3–12)
Neutro Abs: 4 10*3/uL (ref 1.7–7.7)
Neutrophils Relative %: 65 % (ref 43–77)
RDW: 14.9 % (ref 11.5–15.5)

## 2011-04-02 LAB — HIV-1 RNA QUANT-NO REFLEX-BLD: HIV-1 RNA Quant, Log: 2.45 {Log} — ABNORMAL HIGH (ref <1.30)

## 2011-04-17 ENCOUNTER — Ambulatory Visit: Payer: 59

## 2011-04-17 ENCOUNTER — Encounter: Payer: Self-pay | Admitting: Internal Medicine

## 2011-04-17 ENCOUNTER — Ambulatory Visit (INDEPENDENT_AMBULATORY_CARE_PROVIDER_SITE_OTHER): Payer: 59 | Admitting: Internal Medicine

## 2011-04-17 VITALS — BP 135/85 | HR 103 | Temp 98.1°F | Ht 64.5 in | Wt 161.0 lb

## 2011-04-17 DIAGNOSIS — B20 Human immunodeficiency virus [HIV] disease: Secondary | ICD-10-CM

## 2011-04-17 DIAGNOSIS — Z23 Encounter for immunization: Secondary | ICD-10-CM

## 2011-04-17 NOTE — Assessment & Plan Note (Addendum)
Good immune reconstitution with almost undetectable viral load at 281.  CD4 up to 50 now.  I will have her continue azithromycin prophylaxis for now until next visit. Continue ART and Bactrim.  Patient counselled on adherence and condom use for prevention.  She has condoms and is currently abstinent.   On iron for anemia.

## 2011-04-17 NOTE — Progress Notes (Signed)
  Subjective:    Patient ID: Briana [name], female    DOB: June 18, 1976, 35 y.o.   MRN: [id number]  HPI here for follow up after starting boosted darunavir and Truvada.  She started her medicines about 1 week prior to previous labs and therefore was brought back today for follow up labs prior to this appt.  She is tolerating the meds well, some nausea she equates to Bactrim but has resolved.  She does complain of a rash on her arms that is pruruitic, but it is not red or c/w drug reaction.  She otherwise has no complaints today.     Review of Systems  All other systems reviewed and are negative.       Objective:   Physical Exam  Constitutional: She is oriented to person, place, and time. She appears well-developed and well-nourished.  Cardiovascular: Normal rate, regular rhythm and normal heart sounds.   No murmur heard. Pulmonary/Chest: Effort normal and breath sounds normal. No respiratory distress.  Abdominal: Soft. Bowel sounds are normal. She exhibits no distension.  Lymphadenopathy:    She has no cervical adenopathy.  Neurological: She is alert and oriented to person, place, and time.  Skin: Skin is warm and dry.       + papilary lesions on arms, not urticarial or erythematous.    Psychiatric: She has a normal mood and affect. Her behavior is normal.          Assessment & Plan:

## 2011-04-21 ENCOUNTER — Encounter: Payer: Self-pay | Admitting: Family Medicine

## 2011-04-21 ENCOUNTER — Ambulatory Visit (INDEPENDENT_AMBULATORY_CARE_PROVIDER_SITE_OTHER): Payer: 59 | Admitting: Family Medicine

## 2011-04-21 VITALS — BP 130/88 | HR 94 | Temp 98.0°F | Ht 62.0 in | Wt 164.2 lb

## 2011-04-21 DIAGNOSIS — IMO0002 Reserved for concepts with insufficient information to code with codable children: Secondary | ICD-10-CM

## 2011-04-21 DIAGNOSIS — R87613 High grade squamous intraepithelial lesion on cytologic smear of cervix (HGSIL): Secondary | ICD-10-CM

## 2011-04-21 NOTE — Patient Instructions (Signed)
It was a pleasure to care for you today.  It is very important that you return for a LEEP for your abnormal pap smear.

## 2011-04-21 NOTE — Progress Notes (Signed)
Subjective:     Briana [name] is a 35 y.o. woman who comes in today for a follow up of her results from her colposcopy.  Her most recent Pap smear was on 01/24/11 and showed high-grade squamous intraepithelial neoplasia  (HGSIL-encompassing moderate and severe dysplasia) s/p colposcopy that was consistent with her abnormal pap of HSIL/CIN2 on bx at 3, 6, and 12 o'clock. Contraception: tubal ligation  The following portions of the patient's history were reviewed and updated as appropriate: allergies, current medications, past family history, past medical history, past social history, past surgical history and problem list.  Review of Systems Pertinent items are noted in HPI.   Objective:    BP 130/88  Pulse 94  Temp(Src) 98 F (36.7 C) (Oral)  Ht 5\' 2"  (1.575 m)  Wt 164 lb 3.2 oz (74.481 kg)  BMI 30.03 kg/m2  LMP 03/26/2011 Pelvic Exam: exam deferred today.  Gen: NAD, AAOx3, resting comfortably in no acute distress.   Assessment:    1. HSIL s/p colposcopy with +bx neg ECC.   Plan:    Follow up in next available for a LEEP. Procedure discussed with the patient and information provided.

## 2011-05-29 ENCOUNTER — Telehealth: Payer: Self-pay | Admitting: *Deleted

## 2011-05-29 NOTE — Telephone Encounter (Signed)
States she has a rash like one she had 4 years ago. Wants med called in. Told her md would need to see it. She has insurance so told her she can go to any walk in clinic md. Told her we have no appts today or tomorrow. She does not have a pcp. Told her it may be a good idea to establish with a doctor who can see her for problems that arise that are not HIV related or when we have no appts. She will go to a walk in md [facility] [place] [facility]

## 2011-06-05 ENCOUNTER — Telehealth: Payer: Self-pay | Admitting: *Deleted

## 2011-06-05 ENCOUNTER — Other Ambulatory Visit: Payer: Self-pay | Admitting: Infectious Diseases

## 2011-06-05 ENCOUNTER — Other Ambulatory Visit: Payer: 59

## 2011-06-05 DIAGNOSIS — B20 Human immunodeficiency virus [HIV] disease: Secondary | ICD-10-CM

## 2011-06-05 LAB — CBC WITH DIFFERENTIAL/PLATELET
Eosinophils Absolute: 0.7 10*3/uL (ref 0.0–0.7)
Eosinophils Relative: 12 % — ABNORMAL HIGH (ref 0–5)
Lymphs Abs: 1.4 10*3/uL (ref 0.7–4.0)
MCH: 20.2 pg — ABNORMAL LOW (ref 26.0–34.0)
MCV: 72.4 fL — ABNORMAL LOW (ref 78.0–100.0)
Platelets: 406 10*3/uL — ABNORMAL HIGH (ref 150–400)
RBC: 4.3 MIL/uL (ref 3.87–5.11)

## 2011-06-05 LAB — COMPLETE METABOLIC PANEL WITH GFR
ALT: 11 U/L (ref 0–35)
AST: 20 U/L (ref 0–37)
Albumin: 4 g/dL (ref 3.5–5.2)
Calcium: 8.9 mg/dL (ref 8.4–10.5)
Chloride: 107 mEq/L (ref 96–112)
Creat: 0.72 mg/dL (ref 0.50–1.10)
Potassium: 4.3 mEq/L (ref 3.5–5.3)
Sodium: 136 mEq/L (ref 135–145)
Total Protein: 8.7 g/dL — ABNORMAL HIGH (ref 6.0–8.3)

## 2011-06-05 NOTE — Telephone Encounter (Signed)
Patient left a message stating that she is scheduled for a LEEP on 06/10/11. She started her period today. She wants to know if she should reschedule her LEEP.

## 2011-06-06 NOTE — Telephone Encounter (Signed)
Called pt . I informed her that if she is having only light spotting on day of appt, she can still have the procedure. She will wait to assess her amt of bleeding on 10/8 and call back to re-schedule if needed. Pt voiced understanding.

## 2011-06-09 ENCOUNTER — Other Ambulatory Visit (HOSPITAL_COMMUNITY)
Admission: RE | Admit: 2011-06-09 | Discharge: 2011-06-09 | Disposition: A | Discharge status: Home / Self Care | Payer: 59 | Source: Ambulatory Visit | Attending: Family Medicine | Admitting: Family Medicine

## 2011-06-09 ENCOUNTER — Ambulatory Visit (INDEPENDENT_AMBULATORY_CARE_PROVIDER_SITE_OTHER): Payer: 59 | Admitting: Family Medicine

## 2011-06-09 ENCOUNTER — Encounter: Payer: Self-pay | Admitting: Family Medicine

## 2011-06-09 VITALS — BP 120/81 | HR 88 | Temp 97.6°F | Ht 64.5 in | Wt 167.7 lb

## 2011-06-09 DIAGNOSIS — N871 Moderate cervical dysplasia: Secondary | ICD-10-CM | POA: Insufficient documentation

## 2011-06-09 LAB — HIV-1 RNA QUANT-NO REFLEX-BLD
HIV 1 RNA Quant: <20 copies/mL (ref <20)
HIV-1 RNA Quant, Log: <1.3 {Log} (ref <1.30)

## 2011-06-09 NOTE — Progress Notes (Signed)
Addended by: Levie Heritage on: 06/09/2011 03:20 PM   Modules accepted: Orders

## 2011-06-09 NOTE — Progress Notes (Signed)
  LEEP PROCEDURE NOTE Pap smear and colposcopy reviewed.   Pap HSIL Colpo Biopsy CIN2 ECC normal Risks, benefits, alternatives, and limitations of procedure explained to patient, including pain, bleeding, infection, failure to remove abnormal tissue and failure to cure dysplasia, need for repeat procedures, damage to pelvic organs, cervical incompetence.  Role of HPV,cervical dysplasia and need for close followup was empasized. Informed written consent was obtained. All questions were answered. Time out performed.  ??Procedure: The patient was placed in lithotomy position and the bivalved coated speculum was placed in the patient's vagina. A grounding pad placed on the patient. Lugol's solution was applied to the cervix and areas of decreased uptake were noted 12, 3, 6 o'clock with acetowhite changes noted with acetic acid.  Local anesthesia was administered via an intracervical block using 10cc of 2% Lidocaine with epinephrine. The suction was turned on and the Large 1X Fisher Cone Biopsy Excisor on 50 Watts of cutting current was used to excise the area of decreased uptake and excise the entire transformation zone. Hemostasis was achieved using roller ball coagulation set at 50 Watts coagulation current. Monsel's solution was then applied and the speculum was removed from the vagina. Specimens were sent to pathology. ?The patient tolerated the procedure well. Post-operative instructions given to patient, including instruction to seek medical attention for persistent bright red bleeding, fever, abdominal/pelvic pain, dysuria, nausea or vomiting. She was also told about the possibility of having copious yellow to black tinged discharge. She was counseled to avoid anything in the vagina (sex/douching/tampons) for 4-6 weeks. She has a  2-week post-operative check to review results and assess wound healing. Follow up in 4 months for repeat pap or as needed.  Dr Marcie Bal was present during the entire  procedure.

## 2011-06-18 ENCOUNTER — Ambulatory Visit (INDEPENDENT_AMBULATORY_CARE_PROVIDER_SITE_OTHER): Payer: 59 | Admitting: Family Medicine

## 2011-06-18 ENCOUNTER — Encounter: Payer: Self-pay | Admitting: Family Medicine

## 2011-06-18 VITALS — BP 129/88 | HR 96 | Temp 98.3°F | Ht 64.5 in | Wt 172.7 lb

## 2011-06-18 DIAGNOSIS — N871 Moderate cervical dysplasia: Secondary | ICD-10-CM

## 2011-06-18 NOTE — Progress Notes (Signed)
  Subjective:    Patient ID: Briana [name], female    DOB: Dec 24, 1975, 35 y.o.   MRN: [id number]  HPI This is a 35 year old woman who comes to the clinic for results of her LEEP procedure. The procedure was done approximately one week ago with pathology results of CIN-2 to 3. The patient reports coffee-ground discharge that has been decreasing over the past 3 or 4 days. She denies abdominal tenderness, uterine cramping, vaginal bleeding.   Review of Systems - see history of present illness     Objective:   Physical Exam  Constitutional: She appears well-developed and well-nourished.  Abdominal: Soft. Bowel sounds are normal. She exhibits no distension and no mass. There is no tenderness. There is no rebound and no guarding.  Skin: Skin is dry.      Assessment & Plan:  1. CIN-2 to 3  I discussed with the patient results of the LEEP procedure as well as proposed followup of Pap smear every 6 months until she has 2 normal Pap smears. Should the patient have atypical cells or positive HPV, acolposcopy should be performed. Followup in 6 months.

## 2011-06-19 ENCOUNTER — Ambulatory Visit (INDEPENDENT_AMBULATORY_CARE_PROVIDER_SITE_OTHER): Payer: 59 | Admitting: Internal Medicine

## 2011-06-19 ENCOUNTER — Encounter: Payer: Self-pay | Admitting: Internal Medicine

## 2011-06-19 VITALS — BP 128/81 | HR 108 | Temp 98.3°F | Ht 64.0 in | Wt 170.0 lb

## 2011-06-19 DIAGNOSIS — B20 Human immunodeficiency virus [HIV] disease: Secondary | ICD-10-CM

## 2011-06-19 NOTE — Patient Instructions (Signed)
If rash worsens, return sooner

## 2011-06-19 NOTE — Progress Notes (Signed)
  Subjective:    Patient ID: Briana [name], female    DOB: November 04, 1975, 35 y.o.   MRN: [id number]  HPI she comes in today for follow up of taking her medications and reports 100% compliance. She has had no side effects from the medications and has reports me no weight loss, dysphasia, diarrhea, urinary problems. Her recent labs were reviewed and she is close to undetectable and her T-cell count is slowly climbing up. Her only complaint today she has had any notable rash on her neck and her leg. She has never had a rash like this before, she describes it as hyperpigmented, on her neck and leg and dry. She also complains of itchiness. She tells me that it has not progressed significantly though is unclear of what it came from. She also did see gynecology for an abnormal Pap smear, had a colposcopy, and by her report was fairly benign findings. She does have a followup with gynecology in 2 months.    Review of Systems  Constitutional: Negative for fever, activity change, fatigue and unexpected weight change.  HENT: Negative for tinnitus.   Eyes: Negative for redness and itching.  Respiratory: Negative for cough, shortness of breath and wheezing.   Cardiovascular: Negative for chest pain.  Gastrointestinal: Negative for nausea, diarrhea, constipation and abdominal distention.  Genitourinary: Negative for urgency, frequency and hematuria.  Musculoskeletal: Negative for myalgias and arthralgias.  Skin: Negative for rash.  Neurological: Negative for dizziness, weakness and headaches.  Hematological: Negative for adenopathy.  Psychiatric/Behavioral: Negative for dysphoric mood.       Objective:   Physical Exam  Constitutional: She is oriented to person, place, and time. She appears well-developed and well-nourished. No distress.  HENT:  Mouth/Throat: Oropharynx is clear and moist. No oropharyngeal exudate.  Eyes: Right eye exhibits no discharge. Left eye exhibits no discharge. No scleral  icterus.  Cardiovascular: Normal rate, regular rhythm and normal heart sounds.   No murmur heard. Pulmonary/Chest: Effort normal and breath sounds normal. She has no wheezes.  Abdominal: Soft. Bowel sounds are normal. She exhibits no distension. There is no tenderness.  Musculoskeletal: Normal range of motion.  Lymphadenopathy:    She has no cervical adenopathy.  Neurological: She is alert and oriented to person, place, and time.  Skin: Skin is warm and dry. No rash noted. No erythema.  Psychiatric: She has a normal mood and affect. Her behavior is normal.          Assessment & Plan:

## 2011-06-19 NOTE — Assessment & Plan Note (Signed)
She continues to do well and is nearly undetectable. She will continue with her current regimen and start routine followup in 4 months. She was told that if her rash does not improve, worsens or there any other complaints to come sooner. I did discuss condom use for prevention of resistant viruses and healthy eating. She is to continue azithromycin prophylaxis as well as Bactrim prophylaxis, though her a azithromycin prophylaxis may be continued next visit if her T-cell count remains stable. She otherwise is up-to-date with her vaccines.

## 2011-08-15 ENCOUNTER — Other Ambulatory Visit: Payer: Self-pay | Admitting: *Deleted

## 2011-08-15 DIAGNOSIS — B20 Human immunodeficiency virus [HIV] disease: Secondary | ICD-10-CM

## 2011-08-15 MED ORDER — RITONAVIR 100 MG PO TABS: 100.0000 mg | ORAL_TABLET | Freq: Every day | ORAL | Status: DC

## 2011-09-11 ENCOUNTER — Ambulatory Visit: Payer: 59

## 2011-09-11 ENCOUNTER — Ambulatory Visit (INDEPENDENT_AMBULATORY_CARE_PROVIDER_SITE_OTHER): Payer: 59 | Admitting: Internal Medicine

## 2011-09-11 ENCOUNTER — Encounter: Payer: Self-pay | Admitting: Internal Medicine

## 2011-09-11 DIAGNOSIS — B20 Human immunodeficiency virus [HIV] disease: Secondary | ICD-10-CM

## 2011-09-11 DIAGNOSIS — Z79899 Other long term (current) drug therapy: Secondary | ICD-10-CM

## 2011-09-11 DIAGNOSIS — L309 Dermatitis, unspecified: Secondary | ICD-10-CM

## 2011-09-11 DIAGNOSIS — Z113 Encounter for screening for infections with a predominantly sexual mode of transmission: Secondary | ICD-10-CM

## 2011-09-11 DIAGNOSIS — L259 Unspecified contact dermatitis, unspecified cause: Secondary | ICD-10-CM

## 2011-09-11 MED ORDER — TRIAMCINOLONE ACETONIDE 0.1 % EX CREA: TOPICAL_CREAM | Freq: Two times a day (BID) | CUTANEOUS | Status: DC

## 2011-09-11 NOTE — Assessment & Plan Note (Signed)
The rash seems to be consistent with eczema.  Though she has not had a history of it before, it seems to have come out with AIDS.  I have given her topical steroids to help alleviate the itch and told her it will likely improve once her immune system has improved.  I also discussed skin hygiene.

## 2011-09-11 NOTE — Progress Notes (Signed)
  Subjective:    Patient ID: Briana [name], female    DOB: Sep 14, 1975, 36 y.o.   MRN: [id number]  HPI she comes in for follow up of her HIV and continued problems with itching.  She does not have a history of eczema but has a strong family history of it and with her recent AIDS has had difficulty with itching and rashes all over.  In regards to her HIV, she continues to report 100% compliance with the medication and continues to feel well overall, with just some fatigue.  She has some GI discomfort with the weekly azithromycin but otherwise feels well.  No recent OIs.     Review of Systems  Constitutional: Positive for fatigue. Negative for fever, activity change, appetite change and unexpected weight change.  HENT: Negative for sore throat and trouble swallowing.   Respiratory: Negative for cough and shortness of breath.   Gastrointestinal: Negative for nausea, abdominal pain and diarrhea.  Genitourinary: Negative for dysuria and frequency.  Musculoskeletal: Negative for myalgias and arthralgias.  Skin: Positive for rash.  Neurological: Negative for light-headedness and headaches.  Hematological: Negative for adenopathy.  Psychiatric/Behavioral: Negative for sleep disturbance and dysphoric mood.       Objective:   Physical Exam  Constitutional: She appears well-developed and well-nourished. No distress.  HENT:  Mouth/Throat: Oropharynx is clear and moist. No oropharyngeal exudate.  Cardiovascular: Normal rate, regular rhythm and normal heart sounds.  Exam reveals no gallop and no friction rub.   No murmur heard. Pulmonary/Chest: Effort normal and breath sounds normal. No respiratory distress. She has no wheezes.  Abdominal: Soft. Bowel sounds are normal. She exhibits no distension. There is no tenderness. There is no rebound.  Lymphadenopathy:    She has no cervical adenopathy.  Skin: Skin is warm and dry.       + eczema  Psychiatric: She has a normal mood and affect. Her  behavior is normal.          Assessment & Plan:

## 2011-09-11 NOTE — Assessment & Plan Note (Signed)
She will get screening labs today and was told that she will be called if there are any concerns or she can get the results if she calls.  I did remind her of condom use for all sexual activity.  She is to continue with Bactrim and azithromycin for now.  She does get her PAPs at [facility] and is to follow up with them soon due to previous abnormalities.

## 2011-09-12 LAB — CBC WITH DIFFERENTIAL/PLATELET
Basophils Absolute: 0 10*3/uL (ref 0.0–0.1)
Basophils Relative: 0 % (ref 0–1)
Hemoglobin: 10.2 g/dL — ABNORMAL LOW (ref 12.0–15.0)
MCHC: 28.4 g/dL — ABNORMAL LOW (ref 30.0–36.0)
Neutro Abs: 3 10*3/uL (ref 1.7–7.7)
Neutrophils Relative %: 57 % (ref 43–77)
RDW: 21.3 % — ABNORMAL HIGH (ref 11.5–15.5)
WBC: 5.2 10*3/uL (ref 4.0–10.5)

## 2011-09-12 LAB — COMPREHENSIVE METABOLIC PANEL
ALT: 12 U/L (ref 0–35)
AST: 25 U/L (ref 0–37)
Albumin: 4.2 g/dL (ref 3.5–5.2)
Alkaline Phosphatase: 84 U/L (ref 39–117)
Potassium: 4 mEq/L (ref 3.5–5.3)
Sodium: 137 mEq/L (ref 135–145)
Total Protein: 8 g/dL (ref 6.0–8.3)

## 2011-09-15 ENCOUNTER — Other Ambulatory Visit: Payer: Self-pay | Admitting: *Deleted

## 2011-09-15 DIAGNOSIS — L309 Dermatitis, unspecified: Secondary | ICD-10-CM

## 2011-09-15 LAB — HIV-1 RNA QUANT-NO REFLEX-BLD
HIV 1 RNA Quant: 26 copies/mL — ABNORMAL HIGH (ref <20)
HIV-1 RNA Quant, Log: 1.41 {Log} — ABNORMAL HIGH (ref <1.30)

## 2011-09-15 MED ORDER — TRIAMCINOLONE ACETONIDE 0.1 % EX CREA: TOPICAL_CREAM | Freq: Two times a day (BID) | CUTANEOUS | Status: DC

## 2011-09-19 ENCOUNTER — Other Ambulatory Visit: Payer: Self-pay | Admitting: *Deleted

## 2011-09-19 DIAGNOSIS — B20 Human immunodeficiency virus [HIV] disease: Secondary | ICD-10-CM

## 2011-09-19 MED ORDER — DARUNAVIR ETHANOLATE 800 MG PO TABS: 800.0000 mg | ORAL_TABLET | Freq: Every day | ORAL | Status: DC

## 2011-09-19 MED ORDER — AZITHROMYCIN 600 MG PO TABS: 1200.0000 mg | ORAL_TABLET | ORAL | Status: DC

## 2011-09-19 MED ORDER — SULFAMETHOXAZOLE-TMP DS 800-160 MG PO TABS: 1.0000 | ORAL_TABLET | Freq: Every day | ORAL | Status: DC

## 2011-09-19 MED ORDER — EMTRICITABINE-TENOFOVIR DF 200-300 MG PO TABS: 1.0000 | ORAL_TABLET | Freq: Every day | ORAL | Status: DC

## 2011-11-14 ENCOUNTER — Other Ambulatory Visit: Payer: Self-pay | Admitting: *Deleted

## 2011-11-14 DIAGNOSIS — Z7721 Contact with and (suspected) exposure to potentially hazardous body fluids: Secondary | ICD-10-CM

## 2011-11-27 ENCOUNTER — Other Ambulatory Visit (INDEPENDENT_AMBULATORY_CARE_PROVIDER_SITE_OTHER): Payer: 59

## 2011-11-27 DIAGNOSIS — Z7721 Contact with and (suspected) exposure to potentially hazardous body fluids: Secondary | ICD-10-CM

## 2011-11-27 DIAGNOSIS — B20 Human immunodeficiency virus [HIV] disease: Secondary | ICD-10-CM

## 2011-11-27 DIAGNOSIS — Z113 Encounter for screening for infections with a predominantly sexual mode of transmission: Secondary | ICD-10-CM

## 2011-11-27 LAB — CBC WITH DIFFERENTIAL/PLATELET
Eosinophils Absolute: 0.3 10*3/uL (ref 0.0–0.7)
Eosinophils Relative: 7 % — ABNORMAL HIGH (ref 0–5)
HCT: 35.1 % — ABNORMAL LOW (ref 36.0–46.0)
Hemoglobin: 10.5 g/dL — ABNORMAL LOW (ref 12.0–15.0)
Lymphocytes Relative: 33 % (ref 12–46)
Lymphs Abs: 1.3 10*3/uL (ref 0.7–4.0)
MCH: 23.3 pg — ABNORMAL LOW (ref 26.0–34.0)
MCV: 77.8 fL — ABNORMAL LOW (ref 78.0–100.0)
Monocytes Absolute: 0.3 10*3/uL (ref 0.1–1.0)
Monocytes Relative: 7 % (ref 3–12)
Platelets: 319 10*3/uL (ref 150–400)
RBC: 4.51 MIL/uL (ref 3.87–5.11)
WBC: 3.9 10*3/uL — ABNORMAL LOW (ref 4.0–10.5)

## 2011-11-27 LAB — LIPID PANEL
HDL: 50 mg/dL (ref >39)
LDL Cholesterol: 155 mg/dL — ABNORMAL HIGH (ref 0–99)
Total CHOL/HDL Ratio: 4.5 Ratio
VLDL: 21 mg/dL (ref 0–40)

## 2011-11-27 LAB — COMPLETE METABOLIC PANEL WITH GFR
ALT: 13 U/L (ref 0–35)
AST: 22 U/L (ref 0–37)
Alkaline Phosphatase: 102 U/L (ref 39–117)
Calcium: 9.2 mg/dL (ref 8.4–10.5)
Chloride: 104 mEq/L (ref 96–112)
Creat: 0.77 mg/dL (ref 0.50–1.10)

## 2011-11-27 LAB — RPR

## 2011-11-28 LAB — T-HELPER CELL (CD4) - (RCID CLINIC ONLY): CD4 T Cell Abs: 200 uL — ABNORMAL LOW (ref 400–2700)

## 2011-12-11 ENCOUNTER — Ambulatory Visit: Payer: 59 | Admitting: Internal Medicine

## 2011-12-15 ENCOUNTER — Encounter: Payer: Self-pay | Admitting: Internal Medicine

## 2011-12-15 ENCOUNTER — Ambulatory Visit (INDEPENDENT_AMBULATORY_CARE_PROVIDER_SITE_OTHER): Payer: 59 | Admitting: Internal Medicine

## 2011-12-15 VITALS — BP 121/80 | HR 85 | Temp 98.0°F | Ht 62.0 in | Wt 172.0 lb

## 2011-12-15 DIAGNOSIS — D649 Anemia, unspecified: Secondary | ICD-10-CM

## 2011-12-15 DIAGNOSIS — L259 Unspecified contact dermatitis, unspecified cause: Secondary | ICD-10-CM

## 2011-12-15 DIAGNOSIS — Z23 Encounter for immunization: Secondary | ICD-10-CM

## 2011-12-15 DIAGNOSIS — B20 Human immunodeficiency virus [HIV] disease: Secondary | ICD-10-CM

## 2011-12-15 DIAGNOSIS — L309 Dermatitis, unspecified: Secondary | ICD-10-CM

## 2011-12-15 MED ORDER — TRIAMCINOLONE ACETONIDE 0.1 % EX CREA: TOPICAL_CREAM | Freq: Two times a day (BID) | CUTANEOUS | Status: DC

## 2011-12-15 NOTE — Progress Notes (Signed)
  Subjective:    Patient ID: Briana [name], female    DOB: 07/12/1976, 36 y.o.   MRN: [id number]  HPI she comes in for routine followup. She continues to take Prezista, Norvir and Truvada. Her T-cell count is up to 200 and her viral load remains nearly undetectable. She's had no hospitalizations or new issues since last visit. Today she has no complaints. She does have continued itching of her skin.    Review of Systems  Constitutional: Negative for fever, activity change, appetite change and fatigue.  HENT: Negative for sore throat and trouble swallowing.   Respiratory: Negative for cough, shortness of breath and wheezing.   Cardiovascular: Negative for chest pain, palpitations and leg swelling.  Gastrointestinal: Negative for nausea, abdominal pain and diarrhea.  Genitourinary: Negative for genital sores.  Musculoskeletal: Negative for myalgias, joint swelling and arthralgias.  Skin: Negative for pallor and rash.  Neurological: Negative for dizziness and light-headedness.  Hematological: Negative for adenopathy.  Psychiatric/Behavioral: Negative for dysphoric mood. The patient is not nervous/anxious.        Objective:   Physical Exam  Constitutional: She appears well-developed and well-nourished. No distress.  HENT:  Mouth/Throat: Oropharynx is clear and moist. No oropharyngeal exudate.  Cardiovascular: Normal rate, regular rhythm and normal heart sounds.  Exam reveals no gallop and no friction rub.   No murmur heard. Pulmonary/Chest: Effort normal and breath sounds normal. No respiratory distress. She has no wheezes. She has no rales.  Abdominal: Soft. Bowel sounds are normal. She exhibits no distension. There is no tenderness. There is no rebound.  Lymphadenopathy:    She has no cervical adenopathy.  Skin: Skin is warm and dry. No rash noted. No erythema.  Psychiatric: She has a normal mood and affect. Her behavior is normal.          Assessment & Plan:

## 2011-12-15 NOTE — Assessment & Plan Note (Signed)
I will refill the steroid lotion.

## 2011-12-15 NOTE — Assessment & Plan Note (Signed)
This has improved on iron therapy.

## 2011-12-15 NOTE — Assessment & Plan Note (Signed)
She will continue with her current regimen and continues to do well. I did have her stop her weekly 8 azithromycin. She will return to clinic in 3 months for routine followup. She knows to use condoms with all sexual activity.

## 2011-12-15 NOTE — Patient Instructions (Signed)
Stop taking azithromycin weekly.

## 2011-12-17 ENCOUNTER — Other Ambulatory Visit: Payer: Self-pay | Admitting: *Deleted

## 2011-12-17 DIAGNOSIS — L309 Dermatitis, unspecified: Secondary | ICD-10-CM

## 2011-12-17 MED ORDER — TRIAMCINOLONE ACETONIDE 0.1 % EX CREA: TOPICAL_CREAM | Freq: Two times a day (BID) | CUTANEOUS | Status: DC

## 2011-12-17 NOTE — Telephone Encounter (Signed)
Original order sent to the wrong pharmacy.  Resent to [facility].  Pt requested larger jar of cream.  Pt to pick up medication later today.

## 2011-12-18 ENCOUNTER — Other Ambulatory Visit: Payer: Self-pay | Admitting: *Deleted

## 2011-12-18 DIAGNOSIS — L309 Dermatitis, unspecified: Secondary | ICD-10-CM

## 2011-12-18 MED ORDER — TRIAMCINOLONE ACETONIDE 0.1 % EX CREA: TOPICAL_CREAM | Freq: Two times a day (BID) | CUTANEOUS | Status: DC

## 2011-12-18 NOTE — Telephone Encounter (Signed)
rx for triamcinalone faxed back here asking for clarification on dose. I called them & left a message that this is one of the choices on screen when asked the quantity. I asked that they call me back if they need more info. I finally got thru to a pharmacist. Jasmine December, the pharmacist states this 2268 is not an option. The most pt can get is 1lb. I told her to fill with 1 lb

## 2012-01-07 ENCOUNTER — Telehealth: Payer: Self-pay | Admitting: *Deleted

## 2012-01-07 NOTE — Telephone Encounter (Signed)
Message left for pt to call [facility] to make a LEEP f/u appt.  Pt given [facility] telephone number.

## 2012-01-23 ENCOUNTER — Encounter: Payer: Self-pay | Admitting: *Deleted

## 2012-02-13 ENCOUNTER — Other Ambulatory Visit: Payer: Self-pay | Admitting: *Deleted

## 2012-02-13 DIAGNOSIS — B20 Human immunodeficiency virus [HIV] disease: Secondary | ICD-10-CM

## 2012-02-13 MED ORDER — RITONAVIR 100 MG PO TABS: 100.0000 mg | ORAL_TABLET | Freq: Every day | ORAL | Status: DC

## 2012-03-23 ENCOUNTER — Other Ambulatory Visit: Payer: 59

## 2012-03-23 DIAGNOSIS — B20 Human immunodeficiency virus [HIV] disease: Secondary | ICD-10-CM

## 2012-03-23 LAB — CBC WITH DIFFERENTIAL/PLATELET
Basophils Absolute: 0.1 10*3/uL (ref 0.0–0.1)
Basophils Relative: 1 % (ref 0–1)
Eosinophils Relative: 3 % (ref 0–5)
HCT: 30.3 % — ABNORMAL LOW (ref 36.0–46.0)
Hemoglobin: 9.8 g/dL — ABNORMAL LOW (ref 12.0–15.0)
MCH: 22.4 pg — ABNORMAL LOW (ref 26.0–34.0)
MCHC: 32.3 g/dL (ref 30.0–36.0)
MCV: 69.2 fL — ABNORMAL LOW (ref 78.0–100.0)
Monocytes Absolute: 0.4 10*3/uL (ref 0.1–1.0)
Monocytes Relative: 6 % (ref 3–12)
Neutro Abs: 5.1 10*3/uL (ref 1.7–7.7)
RDW: 15.5 % (ref 11.5–15.5)

## 2012-03-23 LAB — COMPREHENSIVE METABOLIC PANEL
ALT: 13 U/L (ref 0–35)
AST: 17 U/L (ref 0–37)
Alkaline Phosphatase: 91 U/L (ref 39–117)
BUN: 7 mg/dL (ref 6–23)
Calcium: 9.6 mg/dL (ref 8.4–10.5)
Chloride: 104 mEq/L (ref 96–112)
Creat: 0.67 mg/dL (ref 0.50–1.10)
Total Bilirubin: 0.3 mg/dL (ref 0.3–1.2)

## 2012-03-24 LAB — HIV-1 RNA QUANT-NO REFLEX-BLD
HIV 1 RNA Quant: <20 copies/mL (ref <20)
HIV-1 RNA Quant, Log: <1.3 {Log} (ref <1.30)

## 2012-03-25 ENCOUNTER — Other Ambulatory Visit: Payer: Self-pay | Admitting: *Deleted

## 2012-03-25 DIAGNOSIS — B20 Human immunodeficiency virus [HIV] disease: Secondary | ICD-10-CM

## 2012-03-25 LAB — T-HELPER CELL (CD4) - (RCID CLINIC ONLY): CD4 % Helper T Cell: 18 % — ABNORMAL LOW (ref 33–55)

## 2012-03-25 MED ORDER — EMTRICITABINE-TENOFOVIR DF 200-300 MG PO TABS: 1.0000 | ORAL_TABLET | Freq: Every day | ORAL | Status: DC

## 2012-03-25 MED ORDER — SULFAMETHOXAZOLE-TMP DS 800-160 MG PO TABS: 1.0000 | ORAL_TABLET | Freq: Every day | ORAL | Status: DC

## 2012-03-25 MED ORDER — DARUNAVIR ETHANOLATE 800 MG PO TABS: 800.0000 mg | ORAL_TABLET | Freq: Every day | ORAL | Status: DC

## 2012-04-06 ENCOUNTER — Ambulatory Visit (INDEPENDENT_AMBULATORY_CARE_PROVIDER_SITE_OTHER): Payer: 59 | Admitting: Internal Medicine

## 2012-04-06 ENCOUNTER — Encounter: Payer: Self-pay | Admitting: Internal Medicine

## 2012-04-06 VITALS — BP 136/86 | HR 80 | Temp 97.5°F | Ht 65.0 in | Wt 173.0 lb

## 2012-04-06 DIAGNOSIS — B20 Human immunodeficiency virus [HIV] disease: Secondary | ICD-10-CM

## 2012-04-06 NOTE — Progress Notes (Signed)
  Subjective:    Patient ID: Briana [name], female    DOB: 05/05/1976, 36 y.o.   MRN: [id number]  HPI She comes in here for routine followup of her HIV. She was started about one year ago on Prezista, Norvir and Truvada.  She reports excellent continued compliance with no missed doses. She's had no new issues since her last visit. She did have a LEEP done in October of 2012 and is due for a followup Pap smear at [facility] clinic. She's had no changes in her medications otherwise feels well   Review of Systems  Constitutional: Negative for fever, chills, fatigue and unexpected weight change.  HENT: Negative for sore throat and trouble swallowing.   Respiratory: Negative for cough and shortness of breath.   Cardiovascular: Negative for chest pain, palpitations and leg swelling.  Gastrointestinal: Negative for abdominal pain, diarrhea and constipation.  Musculoskeletal: Negative for myalgias, joint swelling and arthralgias.  Skin: Negative for rash.  Neurological: Negative for dizziness and headaches.  Hematological: Negative for adenopathy.       Objective:   Physical Exam  Constitutional: She appears well-developed and well-nourished. No distress.  HENT:  Mouth/Throat: Oropharynx is clear and moist. No oropharyngeal exudate.  Cardiovascular: Normal rate, regular rhythm and normal heart sounds.  Exam reveals no gallop and no friction rub.   No murmur heard. Pulmonary/Chest: Effort normal and breath sounds normal. No respiratory distress. She has no wheezes. She has no rales.  Lymphadenopathy:    She has no cervical adenopathy.          Assessment & Plan:

## 2012-04-06 NOTE — Assessment & Plan Note (Signed)
She continues to do very well and is pleased with being undetectable. She will continue with her same regimen. She knows to use condoms with all sexual activity and not to miss any doses of her medications. Return to clinic in 4 months for routine followup.

## 2012-04-06 NOTE — Assessment & Plan Note (Signed)
She is going to call the women's clinic today to find out if she needs followup.

## 2012-04-28 ENCOUNTER — Ambulatory Visit: Payer: 59

## 2012-07-27 ENCOUNTER — Other Ambulatory Visit: Payer: 59

## 2012-08-09 ENCOUNTER — Other Ambulatory Visit (INDEPENDENT_AMBULATORY_CARE_PROVIDER_SITE_OTHER): Payer: 59

## 2012-08-09 DIAGNOSIS — B20 Human immunodeficiency virus [HIV] disease: Secondary | ICD-10-CM

## 2012-08-09 LAB — COMPLETE METABOLIC PANEL WITH GFR
ALT: 10 U/L (ref 0–35)
Alkaline Phosphatase: 96 U/L (ref 39–117)
GFR, Est Non African American: >89 mL/min
Glucose, Bld: 77 mg/dL (ref 70–99)
Sodium: 139 mEq/L (ref 135–145)
Total Bilirubin: 0.3 mg/dL (ref 0.3–1.2)
Total Protein: 8.4 g/dL — ABNORMAL HIGH (ref 6.0–8.3)

## 2012-08-09 LAB — CBC WITH DIFFERENTIAL/PLATELET
Basophils Relative: 1 % (ref 0–1)
HCT: 31.6 % — ABNORMAL LOW (ref 36.0–46.0)
Hemoglobin: 9.5 g/dL — ABNORMAL LOW (ref 12.0–15.0)
Lymphs Abs: 1.9 10*3/uL (ref 0.7–4.0)
MCHC: 30.1 g/dL (ref 30.0–36.0)
Monocytes Absolute: 0.3 10*3/uL (ref 0.1–1.0)
Monocytes Relative: 5 % (ref 3–12)
Neutro Abs: 3.4 10*3/uL (ref 1.7–7.7)
RBC: 4.78 MIL/uL (ref 3.87–5.11)

## 2012-08-10 ENCOUNTER — Ambulatory Visit: Payer: 59 | Admitting: Internal Medicine

## 2012-08-10 LAB — HIV-1 RNA QUANT-NO REFLEX-BLD: HIV-1 RNA Quant, Log: <1.3 {Log} (ref <1.30)

## 2012-08-10 LAB — T-HELPER CELL (CD4) - (RCID CLINIC ONLY)
CD4 % Helper T Cell: 20 % — ABNORMAL LOW (ref 33–55)
CD4 T Cell Abs: 400 uL (ref 400–2700)

## 2012-08-19 ENCOUNTER — Ambulatory Visit (INDEPENDENT_AMBULATORY_CARE_PROVIDER_SITE_OTHER): Payer: 59 | Admitting: Internal Medicine

## 2012-08-19 ENCOUNTER — Encounter: Payer: Self-pay | Admitting: Internal Medicine

## 2012-08-19 VITALS — BP 135/91 | HR 80 | Temp 98.0°F | Ht 64.0 in | Wt 180.0 lb

## 2012-08-19 DIAGNOSIS — R87613 High grade squamous intraepithelial lesion on cytologic smear of cervix (HGSIL): Secondary | ICD-10-CM

## 2012-08-19 DIAGNOSIS — Z113 Encounter for screening for infections with a predominantly sexual mode of transmission: Secondary | ICD-10-CM

## 2012-08-19 DIAGNOSIS — B20 Human immunodeficiency virus [HIV] disease: Secondary | ICD-10-CM

## 2012-08-19 DIAGNOSIS — Z79899 Other long term (current) drug therapy: Secondary | ICD-10-CM

## 2012-08-19 NOTE — Assessment & Plan Note (Addendum)
She is going to call gynecology to schedule an appointment

## 2012-08-19 NOTE — Progress Notes (Signed)
  Subjective:    Patient ID: Briana [name], female    DOB: [birth year], 36 y.o.   MRN: [id number]  HPI She comes in for routine followup of HIV. She continues with Prezista, Norvir and Truvada. She denies any missed doses. She feels well and has no new issues. She has not yet seen gynecology.   Review of Systems  Constitutional: Negative for fever, chills, fatigue and unexpected weight change.  HENT: Negative for sore throat and trouble swallowing.   Respiratory: Negative for cough and shortness of breath.   Cardiovascular: Negative for chest pain, palpitations and leg swelling.  Gastrointestinal: Negative for nausea, abdominal pain and diarrhea.  Musculoskeletal: Negative for myalgias, joint swelling and arthralgias.  Neurological: Negative for dizziness and headaches.       Objective:   Physical Exam  Constitutional: She appears well-developed and well-nourished. No distress.  HENT:  Mouth/Throat: Oropharynx is clear and moist. No oropharyngeal exudate.  Cardiovascular: Normal rate, regular rhythm and normal heart sounds.  Exam reveals no gallop and no friction rub.   No murmur heard. Pulmonary/Chest: Effort normal and breath sounds normal. No respiratory distress. She has no wheezes.          Assessment & Plan:

## 2012-08-19 NOTE — Assessment & Plan Note (Signed)
She continues to do well and remains undetectable. She will return in 6 months. She knows to use condoms with sexual activity.

## 2012-08-24 ENCOUNTER — Other Ambulatory Visit: Payer: Self-pay | Admitting: *Deleted

## 2012-08-24 DIAGNOSIS — B20 Human immunodeficiency virus [HIV] disease: Secondary | ICD-10-CM

## 2012-08-24 MED ORDER — RITONAVIR 100 MG PO TABS: 100.0000 mg | ORAL_TABLET | Freq: Every day | ORAL | Status: DC

## 2012-08-27 ENCOUNTER — Other Ambulatory Visit: Payer: Self-pay | Admitting: *Deleted

## 2012-08-27 DIAGNOSIS — B20 Human immunodeficiency virus [HIV] disease: Secondary | ICD-10-CM

## 2012-08-27 MED ORDER — RITONAVIR 100 MG PO TABS: 100.0000 mg | ORAL_TABLET | Freq: Every day | ORAL | Status: DC

## 2012-09-23 ENCOUNTER — Telehealth: Payer: Self-pay | Admitting: *Deleted

## 2012-09-23 NOTE — Telephone Encounter (Signed)
Message left requesting pt to call [facility] for Follow-up Appt for abnormal LEEP procedure 06/2011.  Was to have followed up 12/2011.  Letter previously sent to pt to call [facility] for appt.  [facility] telephone number left in message.

## 2012-11-16 ENCOUNTER — Ambulatory Visit: Payer: 59

## 2012-12-13 NOTE — Telephone Encounter (Signed)
Spoke with [employer], [place], today.  Bridge Counselor to speak with Susa Loffler about f/u on abnormal PAP smear and missed [facility] appt.s

## 2013-02-10 ENCOUNTER — Other Ambulatory Visit (INDEPENDENT_AMBULATORY_CARE_PROVIDER_SITE_OTHER): Payer: BC Managed Care – PPO

## 2013-02-10 DIAGNOSIS — Z79899 Other long term (current) drug therapy: Secondary | ICD-10-CM

## 2013-02-10 DIAGNOSIS — B20 Human immunodeficiency virus [HIV] disease: Secondary | ICD-10-CM

## 2013-02-10 DIAGNOSIS — Z113 Encounter for screening for infections with a predominantly sexual mode of transmission: Secondary | ICD-10-CM

## 2013-02-10 LAB — CBC WITH DIFFERENTIAL/PLATELET
Basophils Absolute: 0.1 10*3/uL (ref 0.0–0.1)
Basophils Relative: 1 % (ref 0–1)
Eosinophils Relative: 1 % (ref 0–5)
HCT: 30.1 % — ABNORMAL LOW (ref 36.0–46.0)
MCHC: 29.6 g/dL — ABNORMAL LOW (ref 30.0–36.0)
MCV: 67 fL — ABNORMAL LOW (ref 78.0–100.0)
Monocytes Absolute: 0.5 10*3/uL (ref 0.1–1.0)
RDW: 18 % — ABNORMAL HIGH (ref 11.5–15.5)

## 2013-02-10 LAB — COMPLETE METABOLIC PANEL WITH GFR
Albumin: 3.7 g/dL (ref 3.5–5.2)
BUN: 8 mg/dL (ref 6–23)
CO2: 23 mEq/L (ref 19–32)
Calcium: 9.3 mg/dL (ref 8.4–10.5)
GFR, Est African American: >89 mL/min
GFR, Est Non African American: >89 mL/min
Glucose, Bld: 75 mg/dL (ref 70–99)
Potassium: 4.1 mEq/L (ref 3.5–5.3)
Sodium: 135 mEq/L (ref 135–145)
Total Protein: 7.7 g/dL (ref 6.0–8.3)

## 2013-02-10 LAB — RPR

## 2013-02-10 LAB — LIPID PANEL: Cholesterol: 210 mg/dL — ABNORMAL HIGH (ref 0–200)

## 2013-02-11 LAB — HIV-1 RNA QUANT-NO REFLEX-BLD
HIV 1 RNA Quant: <20 copies/mL (ref <20)
HIV-1 RNA Quant, Log: <1.3 {Log} (ref <1.30)

## 2013-02-11 LAB — T-HELPER CELL (CD4) - (RCID CLINIC ONLY)
CD4 % Helper T Cell: 23 % — ABNORMAL LOW (ref 33–55)
CD4 T Cell Abs: 450 uL (ref 400–2700)

## 2013-02-16 ENCOUNTER — Other Ambulatory Visit: Payer: Self-pay | Admitting: *Deleted

## 2013-02-16 DIAGNOSIS — B20 Human immunodeficiency virus [HIV] disease: Secondary | ICD-10-CM

## 2013-02-16 MED ORDER — RITONAVIR 100 MG PO TABS: 100.0000 mg | ORAL_TABLET | Freq: Every day | ORAL | Status: DC

## 2013-02-16 MED ORDER — DARUNAVIR ETHANOLATE 800 MG PO TABS: 800.0000 mg | ORAL_TABLET | Freq: Every day | ORAL | Status: DC

## 2013-02-16 MED ORDER — EMTRICITABINE-TENOFOVIR DF 200-300 MG PO TABS: 1.0000 | ORAL_TABLET | Freq: Every day | ORAL | Status: DC

## 2013-02-24 ENCOUNTER — Ambulatory Visit (INDEPENDENT_AMBULATORY_CARE_PROVIDER_SITE_OTHER): Payer: BC Managed Care – PPO | Admitting: Internal Medicine

## 2013-02-24 ENCOUNTER — Encounter: Payer: Self-pay | Admitting: Internal Medicine

## 2013-02-24 VITALS — BP 119/82 | HR 97 | Temp 97.9°F | Ht 66.0 in | Wt 176.0 lb

## 2013-02-24 DIAGNOSIS — B20 Human immunodeficiency virus [HIV] disease: Secondary | ICD-10-CM

## 2013-02-24 DIAGNOSIS — R87613 High grade squamous intraepithelial lesion on cytologic smear of cervix (HGSIL): Secondary | ICD-10-CM

## 2013-02-24 DIAGNOSIS — N871 Moderate cervical dysplasia: Secondary | ICD-10-CM

## 2013-02-24 DIAGNOSIS — D649 Anemia, unspecified: Secondary | ICD-10-CM

## 2013-02-24 MED ORDER — RITONAVIR 100 MG PO TABS: 100.0000 mg | ORAL_TABLET | Freq: Every day | ORAL | Status: DC

## 2013-02-24 MED ORDER — DARUNAVIR ETHANOLATE 800 MG PO TABS: 800.0000 mg | ORAL_TABLET | Freq: Every day | ORAL | Status: DC

## 2013-02-24 MED ORDER — FERROUS SULFATE 324 (65 FE) MG PO TBEC: 324.0000 mg | DELAYED_RELEASE_TABLET | Freq: Two times a day (BID) | ORAL | Status: DC

## 2013-02-24 MED ORDER — EMTRICITABINE-TENOFOVIR DF 200-300 MG PO TABS: 1.0000 | ORAL_TABLET | Freq: Every day | ORAL | Status: DC

## 2013-02-24 NOTE — Assessment & Plan Note (Signed)
She is doing well with her regimen we'll continue. He can return in 6 months

## 2013-02-24 NOTE — Assessment & Plan Note (Signed)
She has iron deficiency anemia and has been taking iron daily. I did have her take it twice a day. I also will ask gynecology to evaluate her menstrual periods and if there is anything that can be done for blood loss.

## 2013-02-24 NOTE — Progress Notes (Signed)
  Subjective:    Patient ID: Briana [name], female    DOB: 1975/09/02, 37 y.o.   MRN: [id number]  HPI She comes in for routine followup of HIV. She continues with Prezista, Norvir and Truvada. She denies any missed doses. She feels well and has no new issues.  Her CD4 is up to 450 and her viral load remains undetectable. She does continue to take iron once a day and otherwise also reports heavy menstrual periods. She has had her tubes tied in the past. She has not been back to gynecology since she says she needs a referral. No weight loss, diarrhea or rashes.    Review of Systems  Constitutional: Negative for appetite change and fatigue.  HENT: Negative for sore throat and trouble swallowing.   Respiratory: Negative for shortness of breath.   Cardiovascular: Negative for leg swelling.  Gastrointestinal: Negative for nausea and diarrhea.  Skin: Negative for rash.  Neurological: Negative for dizziness, light-headedness and headaches.  Hematological: Negative for adenopathy.  Psychiatric/Behavioral: Negative for dysphoric mood.       Objective:   Physical Exam  Constitutional: She appears well-developed and well-nourished. No distress.  HENT:  Mouth/Throat: Oropharynx is clear and moist. No oropharyngeal exudate.  Cardiovascular: Normal rate, regular rhythm and normal heart sounds.   No murmur heard. Pulmonary/Chest: Effort normal and breath sounds normal. No respiratory distress. She has no wheezes.  Lymphadenopathy:    She has no cervical adenopathy.          Assessment & Plan:

## 2013-02-24 NOTE — Assessment & Plan Note (Signed)
She has had a history of a LEEP procedure and will return to gynecology.

## 2013-03-01 DIAGNOSIS — N189 Chronic kidney disease, unspecified: Secondary | ICD-10-CM

## 2013-03-01 HISTORY — DX: Chronic kidney disease, unspecified: N18.9

## 2013-03-02 ENCOUNTER — Encounter: Payer: Self-pay | Admitting: *Deleted

## 2013-03-02 NOTE — Addendum Note (Signed)
Addended by: Jennet Maduro D on: 03/02/2013 10:44 AM   Modules accepted: Orders

## 2013-03-08 ENCOUNTER — Telehealth: Payer: Self-pay | Admitting: *Deleted

## 2013-03-08 NOTE — Telephone Encounter (Signed)
Message left for pt with appt information.  Sending pt a copy of appt information.

## 2013-03-09 ENCOUNTER — Encounter (HOSPITAL_COMMUNITY): Payer: Self-pay | Admitting: *Deleted

## 2013-03-09 ENCOUNTER — Inpatient Hospital Stay (HOSPITAL_COMMUNITY): Payer: BC Managed Care – PPO

## 2013-03-09 ENCOUNTER — Inpatient Hospital Stay (HOSPITAL_COMMUNITY)
Admission: AD | Admit: 2013-03-09 | Discharge: 2013-03-09 | Disposition: A | Discharge status: Other Acute Inpatient Hospital | Payer: BC Managed Care – PPO | Source: Ambulatory Visit | Admitting: Obstetrics & Gynecology

## 2013-03-09 ENCOUNTER — Inpatient Hospital Stay (HOSPITAL_COMMUNITY)
Admission: AD | Admit: 2013-03-09 | Discharge: 2013-03-11 | DRG: 569 | Disposition: A | Discharge status: Home / Self Care | Payer: BC Managed Care – PPO | Source: Other Acute Inpatient Hospital | Attending: Internal Medicine | Admitting: Internal Medicine

## 2013-03-09 ENCOUNTER — Encounter (HOSPITAL_COMMUNITY): Payer: Self-pay | Admitting: Emergency Medicine

## 2013-03-09 ENCOUNTER — Telehealth: Payer: Self-pay | Admitting: Licensed Clinical Social Worker

## 2013-03-09 DIAGNOSIS — D72819 Decreased white blood cell count, unspecified: Secondary | ICD-10-CM | POA: Diagnosis present

## 2013-03-09 DIAGNOSIS — N390 Urinary tract infection, site not specified: Secondary | ICD-10-CM | POA: Insufficient documentation

## 2013-03-09 DIAGNOSIS — B9620 Unspecified Escherichia coli [E. coli] as the cause of diseases classified elsewhere: Secondary | ICD-10-CM | POA: Diagnosis present

## 2013-03-09 DIAGNOSIS — R509 Fever, unspecified: Secondary | ICD-10-CM

## 2013-03-09 DIAGNOSIS — N12 Tubulo-interstitial nephritis, not specified as acute or chronic: Principal | ICD-10-CM | POA: Diagnosis present

## 2013-03-09 DIAGNOSIS — B20 Human immunodeficiency virus [HIV] disease: Secondary | ICD-10-CM

## 2013-03-09 DIAGNOSIS — R310 Gross hematuria: Secondary | ICD-10-CM | POA: Diagnosis present

## 2013-03-09 DIAGNOSIS — R319 Hematuria, unspecified: Secondary | ICD-10-CM | POA: Insufficient documentation

## 2013-03-09 DIAGNOSIS — D509 Iron deficiency anemia, unspecified: Secondary | ICD-10-CM | POA: Diagnosis present

## 2013-03-09 DIAGNOSIS — R112 Nausea with vomiting, unspecified: Secondary | ICD-10-CM | POA: Diagnosis present

## 2013-03-09 DIAGNOSIS — Z21 Asymptomatic human immunodeficiency virus [HIV] infection status: Secondary | ICD-10-CM | POA: Insufficient documentation

## 2013-03-09 DIAGNOSIS — A498 Other bacterial infections of unspecified site: Secondary | ICD-10-CM | POA: Diagnosis present

## 2013-03-09 LAB — CBC WITH DIFFERENTIAL/PLATELET
Basophils Absolute: 0 10*3/uL (ref 0.0–0.1)
Basophils Relative: 0 % (ref 0–1)
HCT: 30.7 % — ABNORMAL LOW (ref 36.0–46.0)
Hemoglobin: 9 g/dL — ABNORMAL LOW (ref 12.0–15.0)
Lymphocytes Relative: 29 % (ref 12–46)
Monocytes Relative: 14 % — ABNORMAL HIGH (ref 3–12)
Neutro Abs: 1 10*3/uL — ABNORMAL LOW (ref 1.7–7.7)
Neutrophils Relative %: 57 % (ref 43–77)
WBC: 1.8 10*3/uL — ABNORMAL LOW (ref 4.0–10.5)

## 2013-03-09 LAB — URINE MICROSCOPIC-ADD ON

## 2013-03-09 LAB — CBC
Hemoglobin: 8.5 g/dL — ABNORMAL LOW (ref 12.0–15.0)
Platelets: 160 10*3/uL (ref 150–400)
RBC: 4.27 MIL/uL (ref 3.87–5.11)
WBC: 2 10*3/uL — ABNORMAL LOW (ref 4.0–10.5)

## 2013-03-09 LAB — COMPREHENSIVE METABOLIC PANEL
BUN: 11 mg/dL (ref 6–23)
Calcium: 8.4 mg/dL (ref 8.4–10.5)
Creatinine, Ser: 0.8 mg/dL (ref 0.50–1.10)
GFR calc Af Amer: >90 mL/min (ref >90)
Glucose, Bld: 86 mg/dL (ref 70–99)
Total Protein: 8.1 g/dL (ref 6.0–8.3)

## 2013-03-09 LAB — URINALYSIS, ROUTINE W REFLEX MICROSCOPIC
Bilirubin Urine: NEGATIVE
Glucose, UA: NEGATIVE mg/dL
Ketones, ur: 15 mg/dL — AB
Ketones, ur: 40 mg/dL — AB
Nitrite: NEGATIVE
Protein, ur: 100 mg/dL — AB
Specific Gravity, Urine: 1.025 (ref 1.005–1.030)
Urobilinogen, UA: 0.2 mg/dL (ref 0.0–1.0)
pH: 6 (ref 5.0–8.0)

## 2013-03-09 LAB — RETICULOCYTES: Retic Ct Pct: 0.7 % (ref 0.4–3.1)

## 2013-03-09 LAB — WET PREP, GENITAL

## 2013-03-09 MED ORDER — ACETAMINOPHEN 650 MG RE SUPP: 650.0000 mg | Freq: Four times a day (QID) | RECTAL | Status: DC | PRN

## 2013-03-09 MED ORDER — SODIUM CHLORIDE 0.9 % IV SOLN
INTRAVENOUS | Status: DC
  Administered 2013-03-09: 16:00:00 via INTRAVENOUS

## 2013-03-09 MED ORDER — OXYCODONE HCL 5 MG PO TABS: 5.0000 mg | ORAL_TABLET | ORAL | Status: DC | PRN

## 2013-03-09 MED ORDER — POTASSIUM CHLORIDE IN NACL 40-0.9 MEQ/L-% IV SOLN
INTRAVENOUS | Status: DC
  Administered 2013-03-09: 20:00:00 via INTRAVENOUS
  Administered 2013-03-10: 125 mL/h via INTRAVENOUS
  Filled 2013-03-09 (×7): qty 1000

## 2013-03-09 MED ORDER — ACETAMINOPHEN 500 MG PO TABS
1000.0000 mg | ORAL_TABLET | Freq: Once | ORAL | Status: DC
  Filled 2013-03-09: qty 2

## 2013-03-09 MED ORDER — VANCOMYCIN HCL IN DEXTROSE 1-5 GM/200ML-% IV SOLN
1000.0000 mg | Freq: Three times a day (TID) | INTRAVENOUS | Status: DC
  Administered 2013-03-09 – 2013-03-10 (×2): 1000 mg via INTRAVENOUS
  Filled 2013-03-09 (×4): qty 200

## 2013-03-09 MED ORDER — ENOXAPARIN SODIUM 40 MG/0.4ML ~~LOC~~ SOLN
40.0000 mg | SUBCUTANEOUS | Status: DC
  Administered 2013-03-09 – 2013-03-10 (×2): 40 mg via SUBCUTANEOUS
  Filled 2013-03-09 (×3): qty 0.4

## 2013-03-09 MED ORDER — SODIUM CHLORIDE 0.9 % IJ SOLN
3.0000 mL | Freq: Two times a day (BID) | INTRAMUSCULAR | Status: DC
  Administered 2013-03-09: 3 mL via INTRAVENOUS

## 2013-03-09 MED ORDER — PIPERACILLIN-TAZOBACTAM 3.375 G IVPB
3.3750 g | Freq: Three times a day (TID) | INTRAVENOUS | Status: DC
  Administered 2013-03-09 – 2013-03-11 (×5): 3.375 g via INTRAVENOUS
  Filled 2013-03-09 (×8): qty 50

## 2013-03-09 MED ORDER — ACETAMINOPHEN 325 MG PO TABS: 650.0000 mg | ORAL_TABLET | Freq: Four times a day (QID) | ORAL | Status: DC | PRN

## 2013-03-09 MED ORDER — DEXTROSE 5 % IV SOLN
1.0000 g | INTRAVENOUS | Status: DC
  Administered 2013-03-09: 1 g via INTRAVENOUS
  Filled 2013-03-09: qty 10

## 2013-03-09 NOTE — Telephone Encounter (Signed)
Patient was diagnosed with a UTI at [facility] on Monday. She called today because she is not feeling well even after 2 days on Ciprofloxacin 500 mg bid. She started having blood in her urine today but denies pain or fever. I advised her to go to the urgent care or [facility] and she agreed. She lives in [place] and we did not have any physicians this afternoon.

## 2013-03-09 NOTE — MAU Provider Note (Signed)
Attestation of Attending Supervision of Advanced Practitioner (PA/CNM/NP): Evaluation and management procedures were performed by the Advanced Practitioner under my supervision and collaboration.  I have reviewed the Advanced Practitioner's note and chart, and I agree with the management and plan.  I discussed patient with Dr. Mahala Menghini who accepted the transfer of this patient.  Labs have been sent including blood and urine cultures, and the patient will be started on Rocephin 1g IV now.  Dr. Orvan Falconer, Infectious Diseases, was also notified.  Patient will be transferred to [facility] as soon as arrangements are finalized; she will be going a Med-Surg floor.  Jaynie Collins, MD, FACOG Attending Obstetrician & Gynecologist Faculty Practice, [facility]

## 2013-03-09 NOTE — MAU Note (Signed)
Patient states she was seen at an Urgent Care in [facility] on 7-7 and treated for a UTI. States she is unable to keep the medication down and is feeling worse. States blood in urine today and a fever of 102 with chills.

## 2013-03-09 NOTE — H&P (Addendum)
[facility] History and Physical  Briana [name] ZOX:[id number] DOB: 03/31/1976 DOA: 03/09/2013  Referring physician:  PCP: Staci Righter, MD   Chief Complaint:    HPI:  37 year old female presents for admission from [facility] for fever. Patient was seen at [facility] on Monday for a fever that started on Sunday. She was prescribed ciprofloxacin 500 mg by mouth twice a day. The patient started developing some hematuria, dysuria, fever on Monday. She could not keep her medications down and developed nausea and vomiting. She denies any shortness of breath or cough. She denies any recent travel any sick contacts. She follows up regularly with infectious disease and is compliant with her HIV medications. Her last CD4 count was 450       Review of Systems: negative for the following  Constitutional: Denies fever, chills, diaphoresis, appetite change and fatigue.  HEENT: Denies photophobia, eye pain, redness, hearing loss, ear pain, congestion, sore throat, rhinorrhea, sneezing, mouth sores, trouble swallowing, neck pain, neck stiffness and tinnitus.  Respiratory: Denies SOB, DOE, cough, chest tightness, and wheezing.  Cardiovascular: Denies chest pain, palpitations and leg swelling.  Gastrointestinal: Denies nausea, vomiting, abdominal pain, diarrhea, constipation, blood in stool and abdominal distention.  Genitourinary: Denies dysuria, urgency, frequency, hematuria, flank pain and difficulty urinating.  Musculoskeletal: Denies myalgias, back pain, joint swelling, arthralgias and gait problem.  Skin: Denies pallor, rash and wound.  Neurological: Denies dizziness, seizures, syncope, weakness, light-headedness, numbness and headaches.  Hematological: Denies adenopathy. Easy bruising, personal or family bleeding history  Psychiatric/Behavioral: Denies suicidal ideation, mood changes, confusion, nervousness, sleep disturbance and agitation       Past Medical History   Diagnosis Date  . HIV infection   . Screening for malignant neoplasm of the cervix   . Anemia   . Abnormal Pap smear     s/p colposcopy      Past Surgical History  Procedure Laterality Date  . Tubal ligation    . Cosmetic surgery      2 previous c-sections  . Cesarean section      20 00/2008      Social History:  reports that she has never smoked. She has never used smokeless tobacco. She reports that she does not drink alcohol or use illicit drugs.    No Known Allergies  Family History  Problem Relation Age of Onset  . Diabetes Maternal Grandmother   . Hypertension Maternal Grandmother   . Diabetes Maternal Grandfather   . Hypertension Maternal Grandfather   . Diabetes Paternal Grandmother   . Hypertension Paternal Grandmother   . Diabetes Paternal Grandfather   . Hypertension Paternal Grandfather   . Eczema Mother      Prior to Admission medications   Medication Sig Start Date End Date Taking? Authorizing Provider  Darunavir Ethanolate (PREZISTA) 800 MG tablet Take 1 tablet (800 mg total) by mouth daily. 02/24/13 02/24/14 Yes Gardiner Barefoot, MD  emtricitabine-tenofovir (TRUVADA) 200-300 MG per tablet Take 1 tablet by mouth daily. 02/24/13 02/24/14 Yes Gardiner Barefoot, MD  IRON, FERROUS GLUCONATE, PO Take 2 tablets by mouth daily.   Yes Historical Provider, MD  Pseudoeph-Doxylamine-DM-APAP (NYQUIL PO) Take 2 capsules by mouth every 6 (six) hours as needed (for fever, body aches, dry mouth).   Yes Historical Provider, MD  ritonavir (NORVIR) 100 MG TABS Take 1 tablet (100 mg total) by mouth daily. 02/24/13 02/24/14 Yes Gardiner Barefoot, MD     Physical Exam: Filed Vitals:   03/09/13 1809  BP:  120/78  Pulse: 105  Temp: 100.4 F (38 C)  TempSrc: Oral  Resp: 18  SpO2: 100%     Constitutional: Vital signs reviewed. Patient is a well-developed and well-nourished in no acute distress and cooperative with exam. Alert and oriented x3.  Head: Normocephalic and atraumatic   Ear: TM normal bilaterally  Mouth: no erythema or exudates, MMM  Eyes: PERRL, EOMI, conjunctivae normal, No scleral icterus.  Neck: Supple, Trachea midline normal ROM, No JVD, mass, thyromegaly, or carotid bruit present.  Cardiovascular: RRR, S1 normal, S2 normal, no MRG, pulses symmetric and intact bilaterally  Pulmonary/Chest: CTAB, no wheezes, rales, or rhonchi  Abdominal: Soft. Non-tender, non-distended, bowel sounds are normal, no masses, organomegaly, or guarding present.  GU: no CVA tenderness Musculoskeletal: No joint deformities, erythema, or stiffness, ROM full and no nontender Ext: no edema and no cyanosis, pulses palpable bilaterally (DP and PT)  Hematology: no cervical, inginal, or axillary adenopathy.  Neurological: A&O x3, Strenght is normal and symmetric bilaterally, cranial nerve II-XII are grossly intact, no focal motor deficit, sensory intact to light touch bilaterally.  Skin: Warm, dry and intact. No rash, cyanosis, or clubbing.  Psychiatric: Normal mood and affect. speech and behavior is normal. Judgment and thought content normal. Cognition and memory are normal.       Labs on Admission:    Basic Metabolic Panel:  Recent Labs Lab 03/09/13 1715  NA 133*  K 3.1*  CL 99  CO2 20  GLUCOSE 86  BUN 11  CREATININE 0.80  CALCIUM 8.4   Liver Function Tests:  Recent Labs Lab 03/09/13 1715  AST 50*  ALT 20  ALKPHOS 82  BILITOT 0.3  PROT 8.1  ALBUMIN 3.5   No results found for this basename: LIPASE, AMYLASE,  in the last 168 hours No results found for this basename: AMMONIA,  in the last 168 hours CBC:  Recent Labs Lab 03/09/13 1715  WBC 1.8*  NEUTROABS 1.0*  HGB 9.0*  HCT 30.7*  MCV 67.8*  PLT 160   Cardiac Enzymes: No results found for this basename: CKTOTAL, CKMB, CKMBINDEX, TROPONINI,  in the last 168 hours  BNP (last 3 results) No results found for this basename: PROBNP,  in the last 8760 hours    CBG: No results found for this  basename: GLUCAP,  in the last 168 hours  Radiological Exams on Admission: No results found.  EKG: Independently reviewed.   Assessment/Plan Principal Problem:   Leukopenia Active Problems:   HIV DISEASE   Anemia   Candida UTI   Fever Will repeat UA, blood culture Leukopenia could be related to ciprofloxacin Start the patient on broad-spectrum antibiotics namely vancomycin and Zosyn Obtained chest x-ray  consult infectious disease in the morning if necessary Will not cover for opportunistic infections tonight because her CD4 count of 450 in June   Anemia We'll rule out hemolysis, Check LDH, haptoglobin,MAC , CMV, side effect of medications?   Code Status:   full Family Communication: bedside Disposition Plan: admit   Time spent: 70 mins   [facility] Pager [phone]  If 7PM-7AM, please contact night-coverage [email or web address] Password [email or web address] 03/09/2013, 7:20 PM

## 2013-03-09 NOTE — MAU Provider Note (Signed)
History     CSN: [id number]  Arrival date and time: 03/09/13 1515   None     Chief Complaint  Patient presents with  . Fever  . Hematuria   HPI  Pt is 37 yo non pregnant female in no acute distress.  Pt is HIV positive with elevated CD4 count but virus undetectable.  Pt was seen at Urgent Care on 7/7 with fever and treated for UTI with Cipro.  However, pt was nauseated and could not keep medicine down.  Pt states she had fever 104. 2 days ago.  Pt has blood when she wipes after voiding. Pt denies back pain or UTI symptoms.  Pt has hx of LEEP of cervix and has appt in GYN clinic in August.  Past Medical History  Diagnosis Date  . HIV infection   . Screening for malignant neoplasm of the cervix   . Anemia   . Abnormal Pap smear     s/p colposcopy     Past Surgical History  Procedure Laterality Date  . Tubal ligation    . Cosmetic surgery      2 previous c-sections  . Cesarean section      2000/2008    Family History  Problem Relation Age of Onset  . Diabetes Maternal Grandmother   . Hypertension Maternal Grandmother   . Diabetes Maternal Grandfather   . Hypertension Maternal Grandfather   . Diabetes Paternal Grandmother   . Hypertension Paternal Grandmother   . Diabetes Paternal Grandfather   . Hypertension Paternal Grandfather   . Eczema Mother     History  Substance Use Topics  . Smoking status: Never Smoker   . Smokeless tobacco: Never Used  . Alcohol Use: No    Allergies: No Known Allergies  Prescriptions prior to admission  Medication Sig Dispense Refill  . Darunavir Ethanolate (PREZISTA) 800 MG tablet Take 1 tablet (800 mg total) by mouth daily.  30 tablet  11  . emtricitabine-tenofovir (TRUVADA) 200-300 MG per tablet Take 1 tablet by mouth daily.  30 tablet  11  . IRON, FERROUS GLUCONATE, PO Take 2 tablets by mouth daily.      . Pseudoeph-Doxylamine-DM-APAP (NYQUIL PO) Take 2 capsules by mouth every 6 (six) hours as needed (for fever, body aches,  dry mouth).      . ritonavir (NORVIR) 100 MG TABS Take 1 tablet (100 mg total) by mouth daily.  30 tablet  11    Review of Systems  Constitutional: Positive for fever, chills, malaise/fatigue and diaphoresis.  Gastrointestinal: Positive for nausea and vomiting. Negative for abdominal pain.  Genitourinary: Negative for dysuria and flank pain.  Musculoskeletal: Negative for back pain.   Physical Exam   Blood pressure 123/80, pulse 110, temperature 103.1 F (39.5 C), temperature source Oral, resp. rate 16, height 5\' 4"  (1.626 m), weight 74.027 kg (163 lb 3.2 oz), last menstrual period 02/26/2013, SpO2 100.00%.  Physical Exam  Nursing note and vitals reviewed. Constitutional: She is oriented to person, place, and time. She appears well-developed and well-nourished. No distress.  HENT:  Head: Normocephalic.  Eyes: Pupils are equal, round, and reactive to light.  Neck: Normal range of motion. Neck supple.  Cardiovascular: Normal rate.   Respiratory: Effort normal.  GI: Soft. She exhibits no distension. There is no tenderness. There is no rebound and no guarding.  No CVA tenderness  Genitourinary:  Small to mod dark brown thick discharge in vault; cervix stenotic, clean; uterus NSSC NT adnexa without palpable enlargement or  tenderness  Musculoskeletal: Normal range of motion.  Neurological: She is alert and oriented to person, place, and time.  Skin: Skin is warm. No rash noted. She is diaphoretic.  Psychiatric: She has a normal mood and affect.    MAU Course  Procedures IV NS started  Tylenol 1000mg  GC/Chlamydia culture collected Wet prep CBC and blood cultures not done due to transfer to [facility] and lab results will not transfer per lab Discussed with [facility] ED physician Dr. Jeraldine Loots and said she could be directly admitted with hospitalist or she could go through ED and then be admitted Discussed with Dr. Macon Large who will discuss with hospitalist- will transfer to [facility]- Dr.  Mahala Menghini Assessment and Plan    Briana [name] 03/09/2013, 4:25 PM

## 2013-03-09 NOTE — Progress Notes (Signed)
Pt has arrived to unit via [facility]link, alert and oriented. MD notified that pt is on unit, and notified of WBC count. Will continue to monitor.

## 2013-03-09 NOTE — Progress Notes (Signed)
ANTIBIOTIC CONSULT NOTE - INITIAL  Pharmacy Consult for Vancomycin/Zosyn Indication: FUO, possible UTI  No Known Allergies  Patient Measurements:   Wt: 74 kg  Vital Signs: Temp: 100.4 F (38 C) (07/09 1809) Temp src: Oral (07/09 1809) BP: 120/78 mmHg (07/09 1809) Pulse Rate: 105 (07/09 1809) Labs:  Recent Labs  03/09/13 1715  WBC 1.8*  HGB 9.0*  PLT 160  CREATININE 0.80   The CrCl is unknown because both a height and weight (above a minimum accepted value) are required for this calculation. No results found for this basename: VANCOTROUGH, VANCOPEAK, VANCORANDOM, GENTTROUGH, GENTPEAK, GENTRANDOM, TOBRATROUGH, TOBRAPEAK, TOBRARND, AMIKACINPEAK, AMIKACINTROU, AMIKACIN,  in the last 72 hours   Microbiology: Recent Results (from the past 720 hour(s))  WET PREP, GENITAL     Status: Abnormal   Collection Time    03/09/13  4:00 PM      Result Value Range Status   Yeast Wet Prep HPF POC FEW (*) NONE SEEN Final   Trich, Wet Prep NONE SEEN  NONE SEEN Final   Clue Cells Wet Prep HPF POC NONE SEEN  NONE SEEN Final   WBC, Wet Prep HPF POC FEW (*) NONE SEEN Final   Comment: MANY BACTERIA SEEN    Medical History: Past Medical History  Diagnosis Date  . HIV infection   . Screening for malignant neoplasm of the cervix   . Anemia   . Abnormal Pap smear     s/p colposcopy     Medications:  Prescriptions prior to admission  Medication Sig Dispense Refill  . Darunavir Ethanolate (PREZISTA) 800 MG tablet Take 1 tablet (800 mg total) by mouth daily.  30 tablet  11  . emtricitabine-tenofovir (TRUVADA) 200-300 MG per tablet Take 1 tablet by mouth daily.  30 tablet  11  . IRON, FERROUS GLUCONATE, PO Take 2 tablets by mouth daily.      . Pseudoeph-Doxylamine-DM-APAP (NYQUIL PO) Take 2 capsules by mouth every 6 (six) hours as needed (for fever, body aches, dry mouth).      . ritonavir (NORVIR) 100 MG TABS Take 1 tablet (100 mg total) by mouth daily.  30 tablet  11   Assessment: 37  y/o F with HIV who presents with fever. Seen at [facility] on Sunday and prescribed Cipro but developed N/V and was unable to take meds. CD4 on 6/12 was 450. WBC 1.8, Tmax 103.1, Scr 0.80.   7/9 Blood x 2>>  Goal of Therapy:  Vancomycin trough level 15-20 mcg/ml  Plan:  -Start vancomycin 1000mg  IV q8h -Zosyn 3.375G IV q8h to be infused over 4 hours -Trend WBC, temp, renal function -F/U cultures  Thank you for allowing me to take part in this patient's care,  Abran Duke, PharmD Clinical Pharmacist Phone: [phone] Pager: [phone] 03/09/2013 7:29 PM

## 2013-03-10 DIAGNOSIS — R197 Diarrhea, unspecified: Secondary | ICD-10-CM | POA: Diagnosis present

## 2013-03-10 DIAGNOSIS — A498 Other bacterial infections of unspecified site: Secondary | ICD-10-CM

## 2013-03-10 DIAGNOSIS — N390 Urinary tract infection, site not specified: Secondary | ICD-10-CM

## 2013-03-10 DIAGNOSIS — D72819 Decreased white blood cell count, unspecified: Secondary | ICD-10-CM

## 2013-03-10 DIAGNOSIS — R112 Nausea with vomiting, unspecified: Secondary | ICD-10-CM | POA: Diagnosis present

## 2013-03-10 LAB — TSH: TSH: 5.208 u[IU]/mL — ABNORMAL HIGH (ref 0.350–4.500)

## 2013-03-10 LAB — CBC
HCT: 29.2 % — ABNORMAL LOW (ref 36.0–46.0)
Hemoglobin: 8.6 g/dL — ABNORMAL LOW (ref 12.0–15.0)
RBC: 4.28 MIL/uL (ref 3.87–5.11)
RDW: 16.2 % — ABNORMAL HIGH (ref 11.5–15.5)
WBC: 1.6 10*3/uL — ABNORMAL LOW (ref 4.0–10.5)

## 2013-03-10 LAB — IRON AND TIBC
Iron: 18 ug/dL — ABNORMAL LOW (ref 42–135)
Saturation Ratios: 5 % — ABNORMAL LOW (ref 20–55)
TIBC: 387 ug/dL (ref 250–470)
UIBC: 369 ug/dL (ref 125–400)

## 2013-03-10 LAB — LACTATE DEHYDROGENASE: LDH: 268 U/L — ABNORMAL HIGH (ref 94–250)

## 2013-03-10 LAB — BASIC METABOLIC PANEL
CO2: 19 mEq/L (ref 19–32)
Chloride: 101 mEq/L (ref 96–112)
GFR calc Af Amer: >90 mL/min (ref >90)
Potassium: 3 mEq/L — ABNORMAL LOW (ref 3.5–5.1)
Sodium: 134 mEq/L — ABNORMAL LOW (ref 135–145)

## 2013-03-10 LAB — GC/CHLAMYDIA PROBE AMP
CT Probe RNA: NEGATIVE
GC Probe RNA: NEGATIVE

## 2013-03-10 LAB — FERRITIN: Ferritin: 28 ng/mL (ref 10–291)

## 2013-03-10 MED ORDER — SACCHAROMYCES BOULARDII 250 MG PO CAPS
250.0000 mg | ORAL_CAPSULE | Freq: Two times a day (BID) | ORAL | Status: DC
  Administered 2013-03-10 – 2013-03-11 (×3): 250 mg via ORAL
  Filled 2013-03-10 (×5): qty 1

## 2013-03-10 MED ORDER — RITONAVIR 100 MG PO TABS
100.0000 mg | ORAL_TABLET | Freq: Every day | ORAL | Status: DC
  Administered 2013-03-10 – 2013-03-11 (×2): 100 mg via ORAL
  Filled 2013-03-10 (×3): qty 1

## 2013-03-10 MED ORDER — DARUNAVIR ETHANOLATE 800 MG PO TABS
800.0000 mg | ORAL_TABLET | Freq: Every day | ORAL | Status: DC
  Administered 2013-03-10 – 2013-03-11 (×2): 800 mg via ORAL
  Filled 2013-03-10 (×3): qty 1

## 2013-03-10 MED ORDER — VANCOMYCIN HCL IN DEXTROSE 750-5 MG/150ML-% IV SOLN
750.0000 mg | Freq: Three times a day (TID) | INTRAVENOUS | Status: DC
  Administered 2013-03-10 – 2013-03-11 (×3): 750 mg via INTRAVENOUS
  Filled 2013-03-10 (×5): qty 150

## 2013-03-10 MED ORDER — EMTRICITABINE-TENOFOVIR DF 200-300 MG PO TABS
1.0000 | ORAL_TABLET | Freq: Every day | ORAL | Status: DC
  Administered 2013-03-10 – 2013-03-11 (×2): 1 via ORAL
  Filled 2013-03-10 (×2): qty 1

## 2013-03-10 MED ORDER — POTASSIUM CHLORIDE CRYS ER 20 MEQ PO TBCR
40.0000 meq | EXTENDED_RELEASE_TABLET | Freq: Two times a day (BID) | ORAL | Status: DC
  Administered 2013-03-10 – 2013-03-11 (×3): 40 meq via ORAL
  Filled 2013-03-10 (×4): qty 2

## 2013-03-10 NOTE — Progress Notes (Addendum)
[facility] PROGRESS NOTE  CILICIA BORDEN ZOX:[id number] DOB: [birth year] DOA: 03/09/2013 PCP: Staci Righter, MD  Assessment/Plan: Principal Problem:   Leukopenia Active Problems:   HIV DISEASE   Anemia   Candida UTI   Fever secondary to pyelonephritis?  Pending repeat urine culture, blood culture  Leukopenia could be related to ciprofloxacin  Continue on broad-spectrum antibiotics namely vancomycin and Zosyn  Chest x-ray negative consult infectious disease in the morning if necessary  Will not cover for opportunistic infections tonight because her CD4  T cell ab  count of 450 in June , last CD4 count was 315   Anemia  Doubt hemolysis as total bilirubin is normal,  LDH mildly elevated, haptoglobin pending,MAC , CMV, side effect of medications?   Code Status: full Family Communication: family updated about patient's clinical progress Disposition Plan:  As above    Brief narrative: 37 year old female presents for admission from [facility] for fever. Patient was seen at [facility] on Monday for a fever that started on Sunday. She was prescribed ciprofloxacin 500 mg by mouth twice a day. The patient started developing some hematuria, dysuria, fever on Monday. She could not keep her medications down and developed nausea and vomiting. She denies any shortness of breath or cough. She denies any recent travel any sick contacts. She follows up regularly with infectious disease and is compliant with her HIV medications. Her last CD4 count was 450   Consultants:  None  Procedures:  None  Antibiotics:  Vancomycin Zosyn  HPI/Subjective: Fever improving, feeling better, blood pressure soft  Objective: Filed Vitals:   03/09/13 1809 03/09/13 2207 03/10/13 0533  BP: 120/78 112/76 95/67  Pulse: 105 88 80  Temp: 100.4 F (38 C) 98.1 F (36.7 C) 98.8 F (37.1 C)  TempSrc: Oral Oral Oral  Resp: 18 16 16   Height:  5\' 4"  (1.626 m)   Weight:  73.3 kg (161  lb 9.6 oz)   SpO2: 100% 94% 99%    Intake/Output Summary (Last 24 hours) at 03/10/13 0818 Last data filed at 03/10/13 8119  Gross per 24 hour  Intake 1578.33 ml  Output      0 ml  Net 1578.33 ml    Exam:  HENT:  Head: Atraumatic.  Nose: Nose normal.  Mouth/Throat: Oropharynx is clear and moist.  Eyes: Conjunctivae are normal. Pupils are equal, round, and reactive to light. No scleral icterus.  Neck: Neck supple. No tracheal deviation present.  Cardiovascular: Normal rate, regular rhythm, normal heart sounds and intact distal pulses.  Pulmonary/Chest: Effort normal and breath sounds normal. No respiratory distress.  Abdominal: Soft. Normal appearance and bowel sounds are normal. She exhibits no distension. There is no tenderness.  Musculoskeletal: She exhibits no edema and no tenderness.  Neurological: She is alert. No cranial nerve deficit.    Data Reviewed: Basic Metabolic Panel:  Recent Labs Lab 03/09/13 1715 03/10/13 0535  NA 133* 134*  K 3.1* 3.0*  CL 99 101  CO2 20 19  GLUCOSE 86 119*  BUN 11 8  CREATININE 0.80 0.79  CALCIUM 8.4 8.5    Liver Function Tests:  Recent Labs Lab 03/09/13 1715  AST 50*  ALT 20  ALKPHOS 82  BILITOT 0.3  PROT 8.1  ALBUMIN 3.5   No results found for this basename: LIPASE, AMYLASE,  in the last 168 hours No results found for this basename: AMMONIA,  in the last 168 hours  CBC:  Recent Labs Lab 03/09/13 1715 03/09/13 2325 03/10/13  0535  WBC 1.8* 2.0* 1.6*  NEUTROABS 1.0*  --   --   HGB 9.0* 8.5* 8.6*  HCT 30.7* 28.8* 29.2*  MCV 67.8* 67.4* 68.2*  PLT 160 160 153    Cardiac Enzymes: No results found for this basename: CKTOTAL, CKMB, CKMBINDEX, TROPONINI,  in the last 168 hours BNP (last 3 results) No results found for this basename: PROBNP,  in the last 8760 hours   CBG: No results found for this basename: GLUCAP,  in the last 168 hours  Recent Results (from the past 240 hour(s))  WET PREP, GENITAL      Status: Abnormal   Collection Time    03/09/13  4:00 PM      Result Value Range Status   Yeast Wet Prep HPF POC FEW (*) NONE SEEN Final   Trich, Wet Prep NONE SEEN  NONE SEEN Final   Clue Cells Wet Prep HPF POC NONE SEEN  NONE SEEN Final   WBC, Wet Prep HPF POC FEW (*) NONE SEEN Final   Comment: MANY BACTERIA SEEN     Studies: Dg Chest 2 View  03/09/2013   *RADIOLOGY REPORT*  Clinical Data: Fever.  CHEST - 2 VIEW  Comparison: None.  Findings: Heart and mediastinal contours are within normal limits. No focal opacities or effusions.  No acute bony abnormality.  IMPRESSION: No active cardiopulmonary disease.   Original Report Authenticated By: Charlett Nose, M.D.    Scheduled Meds: . Darunavir Ethanolate  800 mg Oral Q breakfast  . emtricitabine-tenofovir  1 tablet Oral Daily  . enoxaparin (LOVENOX) injection  40 mg Subcutaneous Q24H  . piperacillin-tazobactam (ZOSYN)  IV  3.375 g Intravenous Q8H  . ritonavir  100 mg Oral Q breakfast  . sodium chloride  3 mL Intravenous Q12H  . vancomycin  1,000 mg Intravenous Q8H   Continuous Infusions: . 0.9 % NaCl with KCl 40 mEq / L 100 mL/hr at 03/09/13 2015    Principal Problem:   Leukopenia Active Problems:   HIV DISEASE   Anemia   Candida UTI    Time spent: 40 minutes   [facility]  [facility] Pager [phone]. If 8PM-8AM, please contact night-coverage at [email or web address], password [facility] 03/10/2013, 8:18 AM  LOS: 1 day

## 2013-03-10 NOTE — Progress Notes (Signed)
Patient reports having loose stools greater than x3. Rule out Cdiff protocol initiated. Briana [name]

## 2013-03-10 NOTE — Consult Note (Signed)
[facility]    Date of Admission:  03/09/2013    Total days of antibiotics 3        Day 1 vancomycin        Day 1 piperacillin tazobactam              Reason for Consult: Urinary tract infection complicated by nausea, vomiting and diarrhea    Referring Physician: Dr. Richarda Overlie  Principal Problem:   E. coli UTI Active Problems:   HIV DISEASE   Microcytic anemia   Leukopenia   Nausea, vomiting and diarrhea   . Darunavir Ethanolate  800 mg Oral Q breakfast  . emtricitabine-tenofovir  1 tablet Oral Daily  . enoxaparin (LOVENOX) injection  40 mg Subcutaneous Q24H  . piperacillin-tazobactam (ZOSYN)  IV  3.375 g Intravenous Q8H  . potassium chloride  40 mEq Oral BID  . ritonavir  100 mg Oral Q breakfast  . saccharomyces boulardii  250 mg Oral BID  . sodium chloride  3 mL Intravenous Q12H  . vancomycin  750 mg Intravenous Q8H    Recommendations: 1. Change IV antibiotics to oral trimethoprim sulfamethoxazole one double strength tablet twice daily and treat for 11 more days 2. I will arrange [facility] followup soon   Assessment: I suspect that her acute illness is due to an Escherichia coli urinary tract infection. Even now she does not have any dysuria she does have gross and microscopic hematuria that is painless and Escherichia coli growing in her urine. I think that is much more likely than other diagnoses such as infectious gastroenteritis or colitis, [place] spotted fever or Erlichiosis. She does have acute leukopenia which again is a little unusual for UTI. There may be some component of adverse reaction to ciprofloxacin on top of a UTI. She is feeling much better today and is eager to go home. I would agree with discharge home on oral trimethoprim sulfamethoxazole with followup in our clinic by early next week at the very latest.    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 She was seen in urgent care center in [place], [place] on July 6. She was told that blood work did not reveal any source for her fever but when they got a urine sample I told her that she had a urinary tract infection. She did not have any abdominal pain, back pain, dysuria or any other urinary tract symptoms at that time. She started on empiric ciprofloxacin. She had no problems tolerating the first dose but then began to develop nausea, vomiting and diarrhea after the second and third doses. She decided to come to the emergency department yesterday and was admitted last night with a temperature of 103. Yesterday she started to note bright red blood on toilet paper after she passed her urine. She is certain that this is not do to her menstrual cycle. The urine culture obtained at the urgent care center grew Escherichia coli sensitive to all antibiotics tested other than ampicillin.  She has not needed to be on any other antibiotics recently. He does not know of any exposures to any sick contacts but she is around many people in her work at the [employer]. She has not had any known tick exposure. She does not have any animals.  She was started on  empiric IV vancomycin and piperacillin tazobactam last night. She is feeling much better today. She has had no more fever or chills. She has not had anymore nausea or vomiting in over 24 hours. She still had some diarrhea this morning.  She has HIV infection and denies missing any of her medications.   Review of Systems: Constitutional: positive for chills, fevers and malaise, negative for anorexia, sweats and weight loss Eyes: negative Ears, nose, mouth, throat, and face: negative Respiratory: negative Cardiovascular: negative Gastrointestinal: positive for diarrhea, nausea and vomiting, negative for abdominal pain and odynophagia Genitourinary:positive for hematuria Integument/breast:  negative for rash  Past Medical History  Diagnosis Date  . HIV infection   . Screening for malignant neoplasm of the cervix   . Anemia   . Abnormal Pap smear     s/p colposcopy     History  Substance Use Topics  . Smoking status: Never Smoker   . Smokeless tobacco: Never Used  . Alcohol Use: No    Family History  Problem Relation Age of Onset  . Diabetes Maternal Grandmother   . Hypertension Maternal Grandmother   . Diabetes Maternal Grandfather   . Hypertension Maternal Grandfather   . Diabetes Paternal Grandmother   . Hypertension Paternal Grandmother   . Diabetes Paternal Grandfather   . Hypertension Paternal Grandfather   . Eczema Mother    No Known Allergies  OBJECTIVE: Blood pressure 109/74, pulse 84, temperature 99.3 F (37.4 C), temperature source Oral, resp. rate 18, height 5\' 4"  (1.626 m), weight 73.3 kg (161 lb 9.6 oz), last menstrual period 02/26/2013, SpO2 100.00%. General: She is alert and in no distress visiting with family Neck: Supple Eyes: Normal external exam Oral: No oropharyngeal lesions Skin: No rash Lungs: Clear Cor: Regular S1 and S2 no murmurs Abdomen: Soft and nontender. No CVA tenderness Joints and extremities: Normal Neuro: Alert and fully oriented with normal speech and conversation Mood and affect: Normal  Lab Results  Component Value Date   WBC 1.6* 03/10/2013   HGB 8.6* 03/10/2013   HCT 29.2* 03/10/2013   MCV 68.2* 03/10/2013   PLT 153 03/10/2013   BMET    Component Value Date/Time   NA 134* 03/10/2013 0535   K 3.0* 03/10/2013 0535   CL 101 03/10/2013 0535   CO2 19 03/10/2013 0535   GLUCOSE 119* 03/10/2013 0535   BUN 8 03/10/2013 0535   CREATININE 0.79 03/10/2013 0535   CREATININE 0.73 02/10/2013 0857   CALCIUM 8.5 03/10/2013 0535   GFRNONAA >90 03/10/2013 0535   GFRAA >90 03/10/2013 0535   HIV 1 RNA Quant (copies/mL)  Date Value  02/10/2013 <20   08/09/2012 <20   03/23/2012 <20      CD4 T Cell Abs (cmm)  Date Value    02/10/2013 450   08/09/2012 400   03/23/2012 330*   Cliffton Asters, MD [facility] [phone] pager   [phone] cell 03/10/2013, 12:23 PM

## 2013-03-11 ENCOUNTER — Other Ambulatory Visit (HOSPITAL_COMMUNITY): Payer: Self-pay | Admitting: Internal Medicine

## 2013-03-11 LAB — CBC
MCH: 20.2 pg — ABNORMAL LOW (ref 26.0–34.0)
MCHC: 29.5 g/dL — ABNORMAL LOW (ref 30.0–36.0)
Platelets: 136 10*3/uL — ABNORMAL LOW (ref 150–400)
RBC: 4.1 MIL/uL (ref 3.87–5.11)
RDW: 16.5 % — ABNORMAL HIGH (ref 11.5–15.5)

## 2013-03-11 LAB — URINE CULTURE: Colony Count: NO GROWTH

## 2013-03-11 LAB — BASIC METABOLIC PANEL
BUN: 3 mg/dL — ABNORMAL LOW (ref 6–23)
Calcium: 8.4 mg/dL (ref 8.4–10.5)
GFR calc non Af Amer: 84 mL/min — ABNORMAL LOW (ref >90)
Glucose, Bld: 87 mg/dL (ref 70–99)
Sodium: 137 mEq/L (ref 135–145)

## 2013-03-11 LAB — CLOSTRIDIUM DIFFICILE BY PCR: Toxigenic C. Difficile by PCR: NEGATIVE

## 2013-03-11 MED ORDER — SULFAMETHOXAZOLE-TMP DS 800-160 MG PO TABS: 1.0000 | ORAL_TABLET | Freq: Two times a day (BID) | ORAL | Status: DC

## 2013-03-11 MED ORDER — SACCHAROMYCES BOULARDII 250 MG PO CAPS: 250.0000 mg | ORAL_CAPSULE | Freq: Two times a day (BID) | ORAL | Status: DC

## 2013-03-11 MED ORDER — SULFAMETHOXAZOLE-TMP DS 800-160 MG PO TABS: 1.0000 | ORAL_TABLET | Freq: Two times a day (BID) | ORAL | Status: AC

## 2013-03-11 NOTE — Telephone Encounter (Signed)
Medication refill

## 2013-03-11 NOTE — Discharge Summary (Signed)
Physician Discharge Summary  Briana [name] MRN: [id number] DOB/AGE: Mar 24, 1976 37 y.o.  PCP: Staci Righter, MD   Admit date: 03/09/2013 Discharge date: 03/11/2013  Discharge Diagnoses:      E. coli UTI Active Problems:   HIV DISEASE   Microcytic anemia   Leukopenia   Nausea, vomiting and diarrhea     Medication List         Darunavir Ethanolate 800 MG tablet  Commonly known as:  PREZISTA  Take 1 tablet (800 mg total) by mouth daily.     emtricitabine-tenofovir 200-300 MG per tablet  Commonly known as:  TRUVADA  Take 1 tablet by mouth daily.     IRON (FERROUS GLUCONATE) PO  Take 2 tablets by mouth daily.     NYQUIL PO  Take 2 capsules by mouth every 6 (six) hours as needed (for fever, body aches, dry mouth).     ritonavir 100 MG Tabs  Commonly known as:  NORVIR  Take 1 tablet (100 mg total) by mouth daily.     saccharomyces boulardii 250 MG capsule  Commonly known as:  FLORASTOR  Take 1 capsule (250 mg total) by mouth 2 (two) times daily.     sulfamethoxazole-trimethoprim 800-160 MG per tablet  Commonly known as:  BACTRIM DS  Take 1 tablet by mouth 2 (two) times daily.        Discharge Condition: Stable   Disposition: 95-DC/txfr to another health care institution with planned acute care hosp IP readmit   Consults: Infectious disease    Significant Diagnostic Studies: Dg Chest 2 View  03/09/2013   *RADIOLOGY REPORT*  Clinical Data: Fever.  CHEST - 2 VIEW  Comparison: None.  Findings: Heart and mediastinal contours are within normal limits. No focal opacities or effusions.  No acute bony abnormality.  IMPRESSION: No active cardiopulmonary disease.   Original Report Authenticated By: Charlett Nose, M.D.       Microbiology: Recent Results (from the past 240 hour(s))  URINE CULTURE     Status: None   Collection Time    03/09/13  3:35 PM      Result Value Range Status   Specimen Description URINE, CLEAN CATCH   Final   Special Requests A    Final   Culture  Setup Time 03/10/2013 00:14   Final   Colony Count NO GROWTH   Final   Culture NO GROWTH   Final   Report Status 03/11/2013 FINAL   Final  WET PREP, GENITAL     Status: Abnormal   Collection Time    03/09/13  4:00 PM      Result Value Range Status   Yeast Wet Prep HPF POC FEW (*) NONE SEEN Final   Trich, Wet Prep NONE SEEN  NONE SEEN Final   Clue Cells Wet Prep HPF POC NONE SEEN  NONE SEEN Final   WBC, Wet Prep HPF POC FEW (*) NONE SEEN Final   Comment: MANY BACTERIA SEEN  GC/CHLAMYDIA PROBE AMP     Status: None   Collection Time    03/09/13  4:00 PM      Result Value Range Status   CT Probe RNA NEGATIVE  NEGATIVE Final   GC Probe RNA NEGATIVE  NEGATIVE Final   Comment: (NOTE)  Assay performed using the Gen-Probe APTIMA COMBO2 (R) Assay.     Acceptable specimen types for this assay include APTIMA Swabs (Unisex,     endocervical, urethral, or vaginal), first void urine, and ThinPrep     liquid based cytology samples.     Labs: Results for orders placed during the hospital encounter of 03/09/13 (from the past 48 hour(s))  URINALYSIS, ROUTINE W REFLEX MICROSCOPIC     Status: Abnormal   Collection Time    03/09/13 10:55 PM      Result Value Range   Color, Urine AMBER (*) YELLOW   Comment: BIOCHEMICALS MAY BE AFFECTED BY COLOR   APPearance CLOUDY (*) CLEAR   Specific Gravity, Urine 1.023  1.005 - 1.030   pH 6.0  5.0 - 8.0   Glucose, UA NEGATIVE  NEGATIVE mg/dL   Hgb urine dipstick LARGE (*) NEGATIVE   Bilirubin Urine NEGATIVE  NEGATIVE   Ketones, ur 40 (*) NEGATIVE mg/dL   Protein, ur 161 (*) NEGATIVE mg/dL   Urobilinogen, UA 0.2  0.0 - 1.0 mg/dL   Nitrite NEGATIVE  NEGATIVE   Leukocytes, UA SMALL (*) NEGATIVE  URINE MICROSCOPIC-ADD ON     Status: Abnormal   Collection Time    03/09/13 10:55 PM      Result Value Range   Squamous Epithelial / LPF MANY (*) RARE   WBC,  UA 3-6  <3 WBC/hpf   RBC / HPF TOO NUMEROUS TO COUNT  <3 RBC/hpf   Bacteria, UA MANY (*) RARE  VITAMIN B12     Status: None   Collection Time    03/09/13 11:25 PM      Result Value Range   Vitamin B-12 400  211 - 911 pg/mL  FOLATE     Status: None   Collection Time    03/09/13 11:25 PM      Result Value Range   Folate 18.7     Comment: (NOTE)     Reference Ranges            Deficient:       0.4 - 3.3 ng/mL            Indeterminate:   3.4 - 5.4 ng/mL            Normal:              > 5.4 ng/mL  IRON AND TIBC     Status: Abnormal   Collection Time    03/09/13 11:25 PM      Result Value Range   Iron 18 (*) 42 - 135 ug/dL   TIBC 096  045 - 409 ug/dL   Saturation Ratios 5 (*) 20 - 55 %   UIBC 369  125 - 400 ug/dL  FERRITIN     Status: None   Collection Time    03/09/13 11:25 PM      Result Value Range   Ferritin 28  10 - 291 ng/mL  RETICULOCYTES     Status: None   Collection Time    03/09/13 11:25 PM      Result Value Range   Retic Ct Pct 0.7  0.4 - 3.1 %   RBC. 4.27  3.87 - 5.11 MIL/uL   Retic Count, Manual 29.9  19.0 - 186.0 K/uL  LACTATE DEHYDROGENASE     Status: Abnormal   Collection Time    03/09/13 11:25 PM      Result Value Range   LDH 268 (*) 94 - 250  U/L  HAPTOGLOBIN     Status: None   Collection Time    03/09/13 11:25 PM      Result Value Range   Haptoglobin 139  45 - 215 mg/dL  CBC     Status: Abnormal   Collection Time    03/09/13 11:25 PM      Result Value Range   WBC 2.0 (*) 4.0 - 10.5 K/uL   RBC 4.27  3.87 - 5.11 MIL/uL   Hemoglobin 8.5 (*) 12.0 - 15.0 g/dL   HCT 16.1 (*) 09.6 - 04.5 %   MCV 67.4 (*) 78.0 - 100.0 fL   MCH 19.9 (*) 26.0 - 34.0 pg   MCHC 29.5 (*) 30.0 - 36.0 g/dL   RDW 40.9 (*) 81.1 - 91.4 %   Platelets 160  150 - 400 K/uL  TSH     Status: Abnormal   Collection Time    03/09/13 11:25 PM      Result Value Range   TSH 5.208 (*) 0.350 - 4.500 uIU/mL  BASIC METABOLIC PANEL     Status: Abnormal   Collection Time    03/10/13  5:35  AM      Result Value Range   Sodium 134 (*) 135 - 145 mEq/L   Potassium 3.0 (*) 3.5 - 5.1 mEq/L   Chloride 101  96 - 112 mEq/L   CO2 19  19 - 32 mEq/L   Glucose, Bld 119 (*) 70 - 99 mg/dL   BUN 8  6 - 23 mg/dL   Creatinine, Ser 7.82  0.50 - 1.10 mg/dL   Calcium 8.5  8.4 - 95.6 mg/dL   GFR calc non Af Amer >90  >90 mL/min   GFR calc Af Amer >90  >90 mL/min   Comment:            The eGFR has been calculated     using the CKD EPI equation.     This calculation has not been     validated in all clinical     situations.     eGFR's persistently     <90 mL/min signify     possible Chronic Kidney Disease.  CBC     Status: Abnormal   Collection Time    03/10/13  5:35 AM      Result Value Range   WBC 1.6 (*) 4.0 - 10.5 K/uL   RBC 4.28  3.87 - 5.11 MIL/uL   Hemoglobin 8.6 (*) 12.0 - 15.0 g/dL   HCT 21.3 (*) 08.6 - 57.8 %   MCV 68.2 (*) 78.0 - 100.0 fL   MCH 20.1 (*) 26.0 - 34.0 pg   MCHC 29.5 (*) 30.0 - 36.0 g/dL   RDW 46.9 (*) 62.9 - 52.8 %   Platelets 153  150 - 400 K/uL  BASIC METABOLIC PANEL     Status: Abnormal   Collection Time    03/11/13  6:30 AM      Result Value Range   Sodium 137  135 - 145 mEq/L   Potassium 4.2  3.5 - 5.1 mEq/L   Chloride 108  96 - 112 mEq/L   CO2 20  19 - 32 mEq/L   Glucose, Bld 87  70 - 99 mg/dL   BUN 3 (*) 6 - 23 mg/dL   Creatinine, Ser 4.13  0.50 - 1.10 mg/dL   Calcium 8.4  8.4 - 24.4 mg/dL   GFR calc non Af Amer 84 (*) >90 mL/min   GFR calc  Af Amer >90  >90 mL/min   Comment:            The eGFR has been calculated     using the CKD EPI equation.     This calculation has not been     validated in all clinical     situations.     eGFR's persistently     <90 mL/min signify     possible Chronic Kidney Disease.  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 She was seen in urgent care center in [place], [place] on July 6. She was told that blood work did not reveal any source for her fever but when they got a urine sample I told her that she had a urinary tract infection. She did not have any abdominal pain, back pain, dysuria or any other urinary tract symptoms at that time. She started on empiric ciprofloxacin. She had no problems tolerating the first dose but then began to develop nausea, vomiting and diarrhea after the second and third doses. She decided to come to the emergency department yesterday and was admitted last night with a temperature of 103. Yesterday she started to note bright red blood on toilet paper after she passed her urine. She is certain that this is not do to her menstrual cycle. The urine culture obtained at the urgent care center grew Escherichia coli sensitive to all antibiotics tested other than ampicillin.  She has not needed to be on any other antibiotics recently. He does not know of any exposures to any sick contacts but she is around many people in her work at the [employer]. She has not had any known tick exposure. She does not have any animals.  She was started on empiric IV vancomycin and piperacillin tazobactam last night. She is feeling much better today. She has had no more fever or chills. She has not had anymore nausea or vomiting in over 24 hours. She still had some diarrhea this morning.  She has HIV infection and denies missing any of her medications.   HOSPITAL COURSE:  Escherichia coli UTI Seen by infectious disease We'll switch to Bactrim for 11 more days Blood culture negative to date Follow up with  infectious disease in one week   Discharge Exam:  Blood pressure 112/76, pulse 77, temperature 98.2 F (36.8 C), temperature source Oral, resp. rate 18, height 5\' 4"  (1.626 m), weight 73.3 kg (161 lb 9.6 oz), last menstrual period 02/26/2013, SpO2 99.00%.  General: She is alert and in no distress visiting with family  Neck: Supple  Eyes: Normal external exam  Oral: No oropharyngeal lesions  Skin: No rash  Lungs: Clear  Cor: Regular S1 and S2 no murmurs  Abdomen: Soft and nontender.  No CVA tenderness  Joints and extremities: Normal  Neuro: Alert and fully oriented with normal speech and conversation  Mood and affect: Normal         Future Appointments Provider Department Dept Phone   03/15/2013 11:15 AM Cliffton Asters, MD [facility] [phone]   04/13/2013 1:45 PM Willodean Rosenthal, MD [facility] [phone]        Signed: Richarda Overlie 03/11/2013, 8:21 AM

## 2013-03-11 NOTE — Progress Notes (Signed)
Patient ID: Briana [name], female   DOB: 10/30/1975, 37 y.o.   MRN: [id number]         [facility] for Infectious Disease    Date of Admission:  03/09/2013   Total days of antibiotics 4         Principal Problem:   E. coli UTI Active Problems:   HIV DISEASE   Microcytic anemia   Leukopenia   Nausea, vomiting and diarrhea   . Darunavir Ethanolate  800 mg Oral Q breakfast  . emtricitabine-tenofovir  1 tablet Oral Daily  . enoxaparin (LOVENOX) injection  40 mg Subcutaneous Q24H  . piperacillin-tazobactam (ZOSYN)  IV  3.375 g Intravenous Q8H  . potassium chloride  40 mEq Oral BID  . ritonavir  100 mg Oral Q breakfast  . saccharomyces boulardii  250 mg Oral BID  . sodium chloride  3 mL Intravenous Q12H  . vancomycin  750 mg Intravenous Q8H    Subjective: She is feeling much better. She has not had any further diarrhea or hematuria.  Past Medical History  Diagnosis Date  . HIV infection   . Screening for malignant neoplasm of the cervix   . Anemia   . Abnormal Pap smear     s/p colposcopy     History  Substance Use Topics  . Smoking status: Never Smoker   . Smokeless tobacco: Never Used  . Alcohol Use: No    Family History  Problem Relation Age of Onset  . Diabetes Maternal Grandmother   . Hypertension Maternal Grandmother   . Diabetes Maternal Grandfather   . Hypertension Maternal Grandfather   . Diabetes Paternal Grandmother   . Hypertension Paternal Grandmother   . Diabetes Paternal Grandfather   . Hypertension Paternal Grandfather   . Eczema Mother     No Known Allergies  Objective: Temp:  [98.2 F (36.8 C)-100.9 F (38.3 C)] 98.8 F (37.1 C) (07/11 0841) Pulse Rate:  [77-100] 87 (07/11 0841) Resp:  [18] 18 (07/11 0841) BP: (93-114)/(62-76) 104/74 mmHg (07/11 0841) SpO2:  [97 %-100 %] 99 % (07/11 0841)  General: She is smiling and sitting on the side of her bed Skin: No rash Lungs: Clear Cor: Regular S1 and S2 no murmurs Abdomen: Soft  and nontender. No CVA tenderness.   Lab Results Lab Results  Component Value Date   WBC 1.7* 03/11/2013   HGB 8.3* 03/11/2013   HCT 28.1* 03/11/2013   MCV 68.5* 03/11/2013   PLT 136* 03/11/2013    Lab Results  Component Value Date   CREATININE 0.89 03/11/2013   BUN 3* 03/11/2013   NA 137 03/11/2013   K 4.2 03/11/2013   CL 108 03/11/2013   CO2 20 03/11/2013    Lab Results  Component Value Date   ALT 20 03/09/2013   AST 50* 03/09/2013   ALKPHOS 82 03/09/2013   BILITOT 0.3 03/09/2013      Microbiology: Patient's urine culture grew Escherichia coli  C. difficile PCR is negative  Assessment: She probably had Escherichia coli pyelonephritis which is resolving with continued therapy. She has acute leukopenia which will probably resolve spontaneously as her infection is treated.  Plan: 1. Discharged home on trimethoprim sulfamethoxazole one double strength tablet twice a day for 10 more days 2. She will see me back in clinic next Tuesday  Cliffton Asters, MD [facility] for Infectious Disease [facility] [phone] pager   [phone] cell 03/11/2013, 11:59 AM

## 2013-03-11 NOTE — Progress Notes (Signed)
Patient discharged to home. Patient AVS reviewed. Patient verbalized understanding od medications and follow-up appointments.  Patient remains stable; no signs or symptoms of distress.  Patient educated to return to the ER in cases of SOB, dizziness, fever, chest pain, or fainting.

## 2013-03-12 LAB — CMV ANTIBODY, IGG (EIA): CMV Ab - IgG: >10 U/mL — ABNORMAL HIGH (ref <0.60)

## 2013-03-15 ENCOUNTER — Ambulatory Visit: Payer: BC Managed Care – PPO | Admitting: Internal Medicine

## 2013-03-15 ENCOUNTER — Telehealth: Payer: Self-pay | Admitting: *Deleted

## 2013-03-15 NOTE — Telephone Encounter (Signed)
Message left for pt to call [facility] to schedule [facility] appt for E. Coli UTI w/ Clinic MD, either Dr. Luciana Axe or Dr. Drue Second.

## 2013-03-16 LAB — CULTURE, BLOOD (ROUTINE X 2): Culture: NO GROWTH

## 2013-03-22 ENCOUNTER — Other Ambulatory Visit: Payer: Self-pay | Admitting: *Deleted

## 2013-03-22 DIAGNOSIS — B20 Human immunodeficiency virus [HIV] disease: Secondary | ICD-10-CM

## 2013-03-22 MED ORDER — DARUNAVIR ETHANOLATE 800 MG PO TABS: 800.0000 mg | ORAL_TABLET | Freq: Every day | ORAL | Status: DC

## 2013-03-22 MED ORDER — EMTRICITABINE-TENOFOVIR DF 200-300 MG PO TABS: 1.0000 | ORAL_TABLET | Freq: Every day | ORAL | Status: DC

## 2013-03-22 MED ORDER — RITONAVIR 100 MG PO TABS: 100.0000 mg | ORAL_TABLET | Freq: Every day | ORAL | Status: DC

## 2013-03-29 ENCOUNTER — Ambulatory Visit: Payer: BC Managed Care – PPO | Admitting: Internal Medicine

## 2013-03-29 ENCOUNTER — Other Ambulatory Visit: Payer: Self-pay | Admitting: *Deleted

## 2013-03-29 DIAGNOSIS — B20 Human immunodeficiency virus [HIV] disease: Secondary | ICD-10-CM

## 2013-03-30 ENCOUNTER — Other Ambulatory Visit: Payer: Self-pay | Admitting: *Deleted

## 2013-03-30 DIAGNOSIS — B20 Human immunodeficiency virus [HIV] disease: Secondary | ICD-10-CM

## 2013-03-30 MED ORDER — EMTRICITABINE-TENOFOVIR DF 200-300 MG PO TABS: 1.0000 | ORAL_TABLET | Freq: Every day | ORAL | Status: DC

## 2013-03-30 MED ORDER — DARUNAVIR ETHANOLATE 800 MG PO TABS: 800.0000 mg | ORAL_TABLET | Freq: Every day | ORAL | Status: DC

## 2013-03-30 MED ORDER — RITONAVIR 100 MG PO TABS: 100.0000 mg | ORAL_TABLET | Freq: Every day | ORAL | Status: DC

## 2013-04-13 ENCOUNTER — Encounter: Payer: Self-pay | Admitting: Obstetrics & Gynecology

## 2013-04-13 ENCOUNTER — Ambulatory Visit (INDEPENDENT_AMBULATORY_CARE_PROVIDER_SITE_OTHER): Payer: BC Managed Care – PPO | Admitting: Obstetrics & Gynecology

## 2013-04-13 VITALS — BP 130/92 | HR 78 | Temp 98.0°F | Resp 20 | Ht 63.0 in | Wt 158.3 lb

## 2013-04-13 DIAGNOSIS — Z01419 Encounter for gynecological examination (general) (routine) without abnormal findings: Secondary | ICD-10-CM

## 2013-04-13 NOTE — Progress Notes (Signed)
Pt sent for annual exam. She had abnormal Pap on 2012, followed by LEEP, then no follow up.  Pt also reports she has been having heavy periods and clots.

## 2013-04-13 NOTE — Patient Instructions (Addendum)
Abnormal Pap Test Information During a Pap test, the cells on the surface of your cervix are checked to see if they look normal, abnormal, or if they show signs of having been altered by a certain type of virus called human papillomavirus, or HPV. Cervical cells that have been affected by HPV are called dysplasia. Dysplasia is not cancer, but describes abnormal cells found on the surface of the cervix. Depending on the degree of dysplasia, some of the cells may be considered pre-cancerous and may turn into cancer over time if follow up with a caregiver is delayed.  WHAT DOES AN ABNORMAL PAP TEST MEAN? Having an abnormal pap test does not mean that you have cancer. However, certain types of abnormal pap tests can be a sign that a person is at a higher risk of developing cancer. Your caregiver will want to do other tests to find out more about the abnormal cells. Your abnormal Pap test results could show:   Small and uncertain changes that should be carefully watched.   Cervical dysplasia that has caused mild changes and can be followed over time.  Cervical dysplasia that is more severe and needs to be followed and treated to ensure the problem goes away.  Cancer.  When severe cervical dysplasia is found and treated early, it rarely will grow into cancer.  WHAT WILL BE DONE ABOUT MY ABNORMAL PAP TEST?  A colposcopy may be needed. This is a procedure where your cervix is examined using light and magnification.  A small tissue sample of your cervix (biopsy) may need to be removed and then examined. This is often performed if there are areas that appear infected.  A sample of cells from the cervical canal may be removed with either a small brush or scraping instrument (curette). Based on the results of the procedures above, some caregivers may recommend either cryotherapy of the cervix or a surgical LEEP where a portion of the cervix is removed. LEEP is short for "loop electrical excisional  procedure." Rarely, a caregiver may recommend a cone biopsy.This is a procedure where a small, cone-shaped sample of your cervix is taken out. The part that is taken out is the area where the abnormal cells are.  WHAT IF I HAVE A DYSPLASIA OR A CANCER? You may be referred to a specialist. Radiation may also be a treatment for more advanced cancer. Having a hysterectomy is the last treatment option for dysplasia, but it is a more common treatment for someone with cancer. All treatment options will be discussed with you by your caregiver. WHAT SHOULD YOU DO AFTER BEING TREATED? If you have had an abnormal pap test, you should continue to have regular pap tests and check-ups as directed by your caregiver. Your cervical problem will be carefully watched so it does not get worse. Also, your caregiver can watch for, and treat, any new problems that may come up. Document Released: 12/03/2010 Document Revised: 11/10/2011 Document Reviewed: 08/14/2011 Exit[employer] Patient Information 2014 [employer].  

## 2013-04-15 NOTE — Progress Notes (Signed)
Patient ID: Briana [name], female   DOB: 06-Jul-1976, 37 y.o.   MRN: [id number] Subjective:     Briana [name] is a 37 y.o. female here for a routine exam.  Current complaints: none.     Gynecologic History Patient's last menstrual period was 03/23/2013. Last Pap: 01/24/2011. Results were: normal Last mammogram: never had.   Obstetric History OB History  Gravida Para Term Preterm AB SAB TAB Ectopic Multiple Living  3 3 3      1 4     # Outcome Date GA Lbr Len/2nd Weight Sex Delivery Anes PTL Lv  3 TRM 01/12/07    F LTCS   Y  2A TRM 12/07/98    F LTCS  Y Y  2B  12/07/98    M   Y   1 TRM 03/17/95    M SVD None  Y       The following portions of the patient's history were reviewed and updated as appropriate: allergies, current medications, past family history, past medical history, past social history, past surgical history and problem list.  Review of Systems A comprehensive review of systems was negative.    Objective:    BP 130/92  Pulse 78  Temp(Src) 98 F (36.7 C) (Oral)  Resp 20  Ht 5\' 3"  (1.6 m)  Wt 158 lb 4.8 oz (71.804 kg)  BMI 28.05 kg/m2  LMP 03/23/2013  General Appearance:    Alert, cooperative, no distress, appears stated age  Head:    Normocephalic, without obvious abnormality, atraumatic  Eyes:    PERRL, conjunctiva/corneas clear, EOM's intact, fundi    benign, both eyes     Nose:   Nares normal, septum midline, mucosa normal, no drainage    or sinus tenderness  Throat:   Lips, mucosa, and tongue normal; teeth and gums normal  Neck:   Supple, symmetrical, trachea midline, no adenopathy;    thyroid:  no enlargement/tenderness/nodules; no carotid   bruit or JVD  Back:     Symmetric, no curvature, ROM normal, no CVA tenderness  Lungs:     Clear to auscultation bilaterally, respirations unlabored  Chest Wall:    No tenderness or deformity   Heart:    Regular rate and rhythm, S1 and S2 normal, no murmur, rub   or gallop  Breast Exam:    No tenderness,  masses, or nipple abnormality  Abdomen:     Soft, non-tender, bowel sounds active all four quadrants,    no masses, no organomegaly  Genitalia:    Normal female without lesion, discharge or tenderness     Extremities:   Extremities normal, atraumatic, no cyanosis or edema  Pulses:   2+ and symmetric all extremities  Skin:   Skin color, texture, turgor normal, no rashes or lesions  Lymph nodes:   Cervical, supraclavicular, and axillary nodes normal         Assessment:    Healthy female exam.    Plan:  F/u PAP and cx    Follow up in: 1 year.

## 2013-04-19 ENCOUNTER — Telehealth: Payer: Self-pay | Admitting: General Practice

## 2013-04-19 ENCOUNTER — Ambulatory Visit (INDEPENDENT_AMBULATORY_CARE_PROVIDER_SITE_OTHER): Payer: BC Managed Care – PPO | Admitting: Internal Medicine

## 2013-04-19 ENCOUNTER — Encounter: Payer: Self-pay | Admitting: Internal Medicine

## 2013-04-19 VITALS — BP 138/89 | HR 82 | Temp 98.1°F | Ht 64.0 in | Wt 158.0 lb

## 2013-04-19 DIAGNOSIS — R87613 High grade squamous intraepithelial lesion on cytologic smear of cervix (HGSIL): Secondary | ICD-10-CM

## 2013-04-19 DIAGNOSIS — D72819 Decreased white blood cell count, unspecified: Secondary | ICD-10-CM

## 2013-04-19 DIAGNOSIS — B20 Human immunodeficiency virus [HIV] disease: Secondary | ICD-10-CM

## 2013-04-19 NOTE — Assessment & Plan Note (Signed)
This was likely transient with her acute illness. I will recheck this with her next lab visit in one week

## 2013-04-19 NOTE — Assessment & Plan Note (Signed)
Recent Pap smear noted and is negative. He does note HPV and she is aware.

## 2013-04-19 NOTE — Telephone Encounter (Signed)
Message copied by Kathee Delton on Tue Apr 19, 2013  9:09 AM ------      Message from: Willodean Rosenthal      Created: Fri Apr 15, 2013  7:57 PM       Please call pt.  She needs to f/u in 1 year for a PAP            clh-S ------

## 2013-04-19 NOTE — Telephone Encounter (Signed)
Called patient no answer- left message to call us back at the clinics. Patient needs pap results & recommendations- yeast infection was also seen on pap.

## 2013-04-19 NOTE — Progress Notes (Signed)
  Subjective:    Patient ID: Briana [name], female    DOB: 1976-03-05, 37 y.o.   MRN: [id number]  HPI She comes in for followup. She was scheduled to followup in December however recently was hospitalized with pyelonephritis. She received appropriate therapy and after several weeks did recover. She now has her appetite back and has had no significant weight loss or new issues. She has had a recent Pap smear which was negative though did note HPV. She continues to take her HIV medicine of Prezista, Norvir and Truvada. Unfortunately, she was out of her medications do to an insurance issue and has been back on them only about 2-3 weeks. She otherwise denies any missed doses before or since. She feels well otherwise. No rash or new issues.   Review of Systems  Constitutional: Negative for fever, activity change, appetite change and fatigue.  HENT: Negative for sore throat and trouble swallowing.   Eyes: Negative for visual disturbance.  Respiratory: Negative for shortness of breath.   Cardiovascular: Negative for leg swelling.  Gastrointestinal: Negative for nausea, abdominal pain and diarrhea.  Musculoskeletal: Negative for myalgias and arthralgias.  Skin: Negative for rash.  Neurological: Negative for dizziness and headaches.  Hematological: Negative for adenopathy.  Psychiatric/Behavioral: Negative for dysphoric mood.       Objective:   Physical Exam  Constitutional: She is oriented to person, place, and time. She appears well-developed and well-nourished. No distress.  HENT:  Mouth/Throat: No oropharyngeal exudate.  Eyes: Right eye exhibits no discharge. Left eye exhibits no discharge. No scleral icterus.  Cardiovascular: Normal rate, regular rhythm and normal heart sounds.   No murmur heard. Pulmonary/Chest: Effort normal and breath sounds normal. No respiratory distress. She has no wheezes.  Lymphadenopathy:    She has no cervical adenopathy.  Neurological: She is alert and  oriented to person, place, and time.  Skin: Skin is warm and dry. No rash noted.  Psychiatric: She has a normal mood and affect. Her behavior is normal.          Assessment & Plan:

## 2013-04-19 NOTE — Assessment & Plan Note (Signed)
She is doing well despite having missed therapy. She understands the mechanism of resistance. She will return in about one week to recheck her labs which will give her adequate time on her current regimen and I will include a genotype in case there is any resistance that has developed. If labs are okay, she will return in 3 months to continue to have a close eye on her virus.

## 2013-04-26 ENCOUNTER — Other Ambulatory Visit: Payer: BC Managed Care – PPO

## 2013-04-26 DIAGNOSIS — B20 Human immunodeficiency virus [HIV] disease: Secondary | ICD-10-CM

## 2013-04-26 LAB — CBC WITH DIFFERENTIAL/PLATELET
Basophils Absolute: 0 10*3/uL (ref 0.0–0.1)
HCT: 29.1 % — ABNORMAL LOW (ref 36.0–46.0)
Hemoglobin: 8.9 g/dL — ABNORMAL LOW (ref 12.0–15.0)
Lymphocytes Relative: 29 % (ref 12–46)
Monocytes Absolute: 0.7 10*3/uL (ref 0.1–1.0)
Monocytes Relative: 10 % (ref 3–12)
Neutro Abs: 3.5 10*3/uL (ref 1.7–7.7)
Neutrophils Relative %: 52 % (ref 43–77)
WBC: 6.8 10*3/uL (ref 4.0–10.5)

## 2013-04-26 LAB — COMPLETE METABOLIC PANEL WITH GFR
Alkaline Phosphatase: 80 U/L (ref 39–117)
BUN: 6 mg/dL (ref 6–23)
Creat: 0.71 mg/dL (ref 0.50–1.10)
GFR, Est Non African American: >89 mL/min
Glucose, Bld: 92 mg/dL (ref 70–99)
Total Bilirubin: 0.3 mg/dL (ref 0.3–1.2)

## 2013-04-27 LAB — T-HELPER CELL (CD4) - (RCID CLINIC ONLY)
CD4 % Helper T Cell: 17 % — ABNORMAL LOW (ref 33–55)
CD4 T Cell Abs: 350 /uL — ABNORMAL LOW (ref 400–2700)

## 2013-04-28 ENCOUNTER — Telehealth: Payer: Self-pay | Admitting: *Deleted

## 2013-04-28 ENCOUNTER — Other Ambulatory Visit: Payer: Self-pay | Admitting: Internal Medicine

## 2013-04-28 DIAGNOSIS — B20 Human immunodeficiency virus [HIV] disease: Secondary | ICD-10-CM

## 2013-04-28 NOTE — Telephone Encounter (Signed)
Called [facility] and infomred her negative pap with + hpv, needs pap in one year per Dr. Erin Fulling. Avianah voices understanding.

## 2013-04-28 NOTE — Telephone Encounter (Signed)
Left patient a voice mail to call the clinic to schedule a repeat lab and follow up with Dr. Luciana Axe. Wendall Mola

## 2013-04-28 NOTE — Telephone Encounter (Signed)
Message copied by Macy Mis on Thu Apr 28, 2013  1:47 PM ------      Message from: Gardiner Barefoot      Created: Thu Apr 28, 2013 12:35 PM       She has detectable virus after having been off and restarting.  I would like her to check another viral load in about 2 weeks (I will place order).   ------

## 2013-05-26 ENCOUNTER — Telehealth: Payer: Self-pay | Admitting: *Deleted

## 2013-05-26 NOTE — Telephone Encounter (Signed)
Left message notifying pt Dr Luciana Axe would like her to have lab work done soon, asked pt to schedule a lab appointment either Friday or early next week.   Andree Coss, RN

## 2013-07-06 ENCOUNTER — Other Ambulatory Visit: Payer: BC Managed Care – PPO

## 2013-07-07 ENCOUNTER — Other Ambulatory Visit: Payer: Self-pay

## 2013-07-20 ENCOUNTER — Ambulatory Visit: Payer: BC Managed Care – PPO | Admitting: Internal Medicine

## 2013-07-21 ENCOUNTER — Other Ambulatory Visit (INDEPENDENT_AMBULATORY_CARE_PROVIDER_SITE_OTHER): Payer: BC Managed Care – PPO

## 2013-07-21 ENCOUNTER — Other Ambulatory Visit: Payer: Self-pay | Admitting: Internal Medicine

## 2013-07-21 DIAGNOSIS — B20 Human immunodeficiency virus [HIV] disease: Secondary | ICD-10-CM

## 2013-07-22 LAB — T-HELPER CELL (CD4) - (RCID CLINIC ONLY): CD4 % Helper T Cell: 13 % — ABNORMAL LOW (ref 33–55)

## 2013-07-22 LAB — HIV-1 RNA ULTRAQUANT REFLEX TO GENTYP+: HIV 1 RNA Quant: 59582 copies/mL — ABNORMAL HIGH (ref <20)

## 2013-08-04 ENCOUNTER — Ambulatory Visit: Payer: BC Managed Care – PPO | Admitting: Internal Medicine

## 2013-08-18 ENCOUNTER — Encounter: Payer: Self-pay | Admitting: Internal Medicine

## 2013-08-18 ENCOUNTER — Ambulatory Visit (INDEPENDENT_AMBULATORY_CARE_PROVIDER_SITE_OTHER): Payer: BC Managed Care – PPO | Admitting: Internal Medicine

## 2013-08-18 VITALS — BP 136/87 | HR 98 | Temp 98.2°F | Ht 63.0 in | Wt 162.0 lb

## 2013-08-18 DIAGNOSIS — B20 Human immunodeficiency virus [HIV] disease: Secondary | ICD-10-CM

## 2013-08-18 DIAGNOSIS — Z23 Encounter for immunization: Secondary | ICD-10-CM

## 2013-08-19 NOTE — Assessment & Plan Note (Signed)
Back on her medicine. I will recheck her VL and CD4 in 2 weeks and follow up after that.  Emphasized compliance and mechanism of resistance when stopping and starting (has stopped previously).

## 2013-08-19 NOTE — Progress Notes (Signed)
   Subjective:    Patient ID: Briana [name], female    DOB: 05-02-1976, 37 y.o.   MRN: [id number]  HPI Here for follow up of HIV.  Had been on Prezista, norvir and truvada but unfortunately had been out of medications due to copay issues.  This now has been resolved and she is back on her medicines for about 2-3 weeks.  She was out about 1 month and had labs with a CD4 of 210 and viral load of 59,000.  Now taking daily with no missed doses.  No new issues, no complaints.  Pap in August at [facility].     Review of Systems  Constitutional: Negative for fatigue and unexpected weight change.  Gastrointestinal: Negative for nausea and diarrhea.  Skin: Negative for rash.  Psychiatric/Behavioral: Negative for dysphoric mood.       Objective:   Physical Exam  Constitutional: She is oriented to person, place, and time. She appears well-developed and well-nourished. No distress.  HENT:  Mouth/Throat: No oropharyngeal exudate.  Eyes: No scleral icterus.  Cardiovascular: Normal rate, regular rhythm and normal heart sounds.   No murmur heard. Pulmonary/Chest: Effort normal and breath sounds normal. No respiratory distress. She has no wheezes.  Lymphadenopathy:    She has no cervical adenopathy.  Neurological: She is alert and oriented to person, place, and time.  Skin: No rash noted.  Psychiatric: She has a normal mood and affect. Her behavior is normal.          Assessment & Plan:

## 2013-08-31 ENCOUNTER — Other Ambulatory Visit: Payer: BC Managed Care – PPO

## 2013-09-05 ENCOUNTER — Other Ambulatory Visit: Payer: BC Managed Care – PPO

## 2013-09-06 ENCOUNTER — Telehealth: Payer: Self-pay | Admitting: *Deleted

## 2013-09-06 NOTE — Telephone Encounter (Signed)
Message copied by Landis Gandy on Tue Sep 06, 2013  9:55 AM ------      Message from: Thayer Headings      Created: Mon Sep 05, 2013 12:25 PM       She missed her repeat lab after restarting her meds.  Can you see if she is able to get in (or if she is off meds for some reason)?  Thanks ------

## 2013-09-06 NOTE — Telephone Encounter (Signed)
Left message informing patient she needs labs, asked her to call and make an appointment. Landis Gandy, RN

## 2013-09-14 ENCOUNTER — Other Ambulatory Visit: Payer: BC Managed Care – PPO

## 2013-09-19 ENCOUNTER — Other Ambulatory Visit: Payer: BC Managed Care – PPO

## 2013-09-27 ENCOUNTER — Ambulatory Visit: Payer: BC Managed Care – PPO | Admitting: Internal Medicine

## 2013-10-31 ENCOUNTER — Other Ambulatory Visit: Payer: Self-pay

## 2013-11-01 ENCOUNTER — Other Ambulatory Visit: Payer: Self-pay

## 2013-11-03 ENCOUNTER — Ambulatory Visit: Payer: Self-pay

## 2013-11-03 ENCOUNTER — Other Ambulatory Visit (INDEPENDENT_AMBULATORY_CARE_PROVIDER_SITE_OTHER): Payer: Self-pay

## 2013-11-03 ENCOUNTER — Other Ambulatory Visit: Payer: Self-pay | Admitting: Internal Medicine

## 2013-11-03 ENCOUNTER — Encounter: Payer: Self-pay | Admitting: *Deleted

## 2013-11-03 DIAGNOSIS — B20 Human immunodeficiency virus [HIV] disease: Secondary | ICD-10-CM

## 2013-11-04 LAB — T-HELPER CELL (CD4) - (RCID CLINIC ONLY)
CD4 % Helper T Cell: 9 % — ABNORMAL LOW (ref 33–55)
CD4 T Cell Abs: 130 /uL — ABNORMAL LOW (ref 400–2700)

## 2013-11-04 LAB — HIV-1 RNA ULTRAQUANT REFLEX TO GENTYP+
HIV 1 RNA Quant: 96643 copies/mL — ABNORMAL HIGH (ref <20)
HIV-1 RNA QUANT, LOG: 4.99 {Log} — AB (ref <1.30)

## 2013-11-08 ENCOUNTER — Other Ambulatory Visit: Payer: Self-pay | Admitting: *Deleted

## 2013-11-08 DIAGNOSIS — B20 Human immunodeficiency virus [HIV] disease: Secondary | ICD-10-CM

## 2013-11-08 MED ORDER — DARUNAVIR ETHANOLATE 800 MG PO TABS: 800.0000 mg | ORAL_TABLET | Freq: Every day | ORAL | Status: DC

## 2013-11-08 MED ORDER — RITONAVIR 100 MG PO TABS: 100.0000 mg | ORAL_TABLET | Freq: Every day | ORAL | Status: DC

## 2013-11-08 MED ORDER — EMTRICITABINE-TENOFOVIR DF 200-300 MG PO TABS: 1.0000 | ORAL_TABLET | Freq: Every day | ORAL | Status: DC

## 2013-11-14 LAB — HIV-1 GENOTYPR PLUS

## 2013-11-15 ENCOUNTER — Encounter: Payer: Self-pay | Admitting: Internal Medicine

## 2013-11-15 ENCOUNTER — Ambulatory Visit (INDEPENDENT_AMBULATORY_CARE_PROVIDER_SITE_OTHER): Payer: Self-pay | Admitting: Internal Medicine

## 2013-11-15 VITALS — BP 129/85 | HR 88 | Temp 98.2°F | Ht 65.0 in | Wt 158.0 lb

## 2013-11-15 DIAGNOSIS — B20 Human immunodeficiency virus [HIV] disease: Secondary | ICD-10-CM

## 2013-11-15 NOTE — Assessment & Plan Note (Signed)
Will folllow up in April, hopefully on meds.

## 2013-11-15 NOTE — Progress Notes (Signed)
   Subjective:    Patient ID: Briana [name], female    DOB: 13-Feb-1976, 38 y.o.   MRN: [id number]  HPI  Here for follow up of HIV.  Had been on Prezista, norvir and truvada but unfortunately had been out of medications due to copay issues and then insurance issues.  She has reapplied for ADAP and awaiting approval.  No weight loss, no diarrhea.    Review of Systems  Constitutional: Negative for fatigue and unexpected weight change.  Gastrointestinal: Negative for nausea and diarrhea.  Skin: Negative for rash.  Psychiatric/Behavioral: Negative for dysphoric mood.       Objective:   Physical Exam  Constitutional: She is oriented to person, place, and time. She appears well-developed and well-nourished. No distress.  HENT:  Mouth/Throat: No oropharyngeal exudate.  Eyes: No scleral icterus.  Cardiovascular: Normal rate, regular rhythm and normal heart sounds.   No murmur heard. Pulmonary/Chest: Effort normal and breath sounds normal. No respiratory distress. She has no wheezes.  Lymphadenopathy:    She has no cervical adenopathy.  Neurological: She is alert and oriented to person, place, and time.  Skin: No rash noted.  Psychiatric: She has a normal mood and affect. Her behavior is normal.          Assessment & Plan:

## 2013-12-08 ENCOUNTER — Other Ambulatory Visit: Payer: Self-pay | Admitting: *Deleted

## 2013-12-08 DIAGNOSIS — B20 Human immunodeficiency virus [HIV] disease: Secondary | ICD-10-CM

## 2013-12-08 MED ORDER — DARUNAVIR ETHANOLATE 800 MG PO TABS: 800.0000 mg | ORAL_TABLET | Freq: Every day | ORAL | Status: DC

## 2013-12-08 MED ORDER — RITONAVIR 100 MG PO TABS: 100.0000 mg | ORAL_TABLET | Freq: Every day | ORAL | Status: DC

## 2013-12-08 MED ORDER — EMTRICITABINE-TENOFOVIR DF 200-300 MG PO TABS: 1.0000 | ORAL_TABLET | Freq: Every day | ORAL | Status: DC

## 2013-12-23 ENCOUNTER — Telehealth: Payer: Self-pay | Admitting: *Deleted

## 2013-12-23 NOTE — Telephone Encounter (Signed)
[place] with me, 2 refills. thanks

## 2013-12-23 NOTE — Telephone Encounter (Signed)
Requesting refill for out-of-date rx - Triamcinolone Cream.  MD please advise.

## 2013-12-26 ENCOUNTER — Other Ambulatory Visit: Payer: Self-pay | Admitting: *Deleted

## 2013-12-26 DIAGNOSIS — L309 Dermatitis, unspecified: Secondary | ICD-10-CM

## 2013-12-26 MED ORDER — TRIAMCINOLONE ACETONIDE 0.1 % EX CREA: TOPICAL_CREAM | Freq: Two times a day (BID) | CUTANEOUS | Status: DC

## 2013-12-26 NOTE — Telephone Encounter (Signed)
Sent RX. Landis Gandy, RN

## 2013-12-27 ENCOUNTER — Telehealth: Payer: Self-pay | Admitting: *Deleted

## 2013-12-27 ENCOUNTER — Ambulatory Visit: Payer: Self-pay | Admitting: Internal Medicine

## 2013-12-27 NOTE — Telephone Encounter (Signed)
Attempted to contact patient to inform of missed visit.  Her number is not in service.   Landis Gandy, RN

## 2013-12-29 NOTE — Telephone Encounter (Signed)
Pt stated that her children were sick and she was unable to make her appt this week.  Requesting new appt.  New appt made.

## 2014-01-19 ENCOUNTER — Ambulatory Visit: Payer: Self-pay | Admitting: Internal Medicine

## 2014-01-31 ENCOUNTER — Ambulatory Visit (INDEPENDENT_AMBULATORY_CARE_PROVIDER_SITE_OTHER): Payer: Self-pay | Admitting: Internal Medicine

## 2014-01-31 ENCOUNTER — Encounter: Payer: Self-pay | Admitting: Internal Medicine

## 2014-01-31 ENCOUNTER — Other Ambulatory Visit: Payer: Self-pay | Admitting: Internal Medicine

## 2014-01-31 VITALS — BP 123/89 | HR 81 | Temp 97.7°F | Ht 64.0 in | Wt 154.0 lb

## 2014-01-31 DIAGNOSIS — Z113 Encounter for screening for infections with a predominantly sexual mode of transmission: Secondary | ICD-10-CM

## 2014-01-31 DIAGNOSIS — B20 Human immunodeficiency virus [HIV] disease: Secondary | ICD-10-CM

## 2014-01-31 DIAGNOSIS — R21 Rash and other nonspecific skin eruption: Secondary | ICD-10-CM

## 2014-01-31 DIAGNOSIS — Z79899 Other long term (current) drug therapy: Secondary | ICD-10-CM

## 2014-01-31 LAB — LIPID PANEL
Cholesterol: 186 mg/dL (ref 0–200)
HDL: 45 mg/dL (ref >39)
LDL CALC: 117 mg/dL — AB (ref 0–99)
Total CHOL/HDL Ratio: 4.1 Ratio
Triglycerides: 121 mg/dL (ref <150)
VLDL: 24 mg/dL (ref 0–40)

## 2014-01-31 LAB — CBC WITH DIFFERENTIAL/PLATELET
Basophils Absolute: 0.1 10*3/uL (ref 0.0–0.1)
Basophils Relative: 1 % (ref 0–1)
Eosinophils Absolute: 0.4 10*3/uL (ref 0.0–0.7)
Eosinophils Relative: 6 % — ABNORMAL HIGH (ref 0–5)
HCT: 28.9 % — ABNORMAL LOW (ref 36.0–46.0)
HEMOGLOBIN: 8.8 g/dL — AB (ref 12.0–15.0)
LYMPHS ABS: 1.5 10*3/uL (ref 0.7–4.0)
Lymphocytes Relative: 24 % (ref 12–46)
MCH: 19.8 pg — AB (ref 26.0–34.0)
MCHC: 30.4 g/dL (ref 30.0–36.0)
MCV: 64.9 fL — ABNORMAL LOW (ref 78.0–100.0)
Monocytes Absolute: 0.4 10*3/uL (ref 0.1–1.0)
Monocytes Relative: 7 % (ref 3–12)
NEUTROS ABS: 3.8 10*3/uL (ref 1.7–7.7)
NEUTROS PCT: 62 % (ref 43–77)
Platelets: 345 10*3/uL (ref 150–400)
RBC: 4.45 MIL/uL (ref 3.87–5.11)
RDW: 16.7 % — ABNORMAL HIGH (ref 11.5–15.5)
WBC: 6.1 10*3/uL (ref 4.0–10.5)

## 2014-01-31 LAB — COMPLETE METABOLIC PANEL WITH GFR
ALT: 9 U/L (ref 0–35)
AST: 15 U/L (ref 0–37)
Albumin: 3.8 g/dL (ref 3.5–5.2)
Alkaline Phosphatase: 86 U/L (ref 39–117)
BILIRUBIN TOTAL: 0.3 mg/dL (ref 0.2–1.2)
BUN: 9 mg/dL (ref 6–23)
CO2: 22 meq/L (ref 19–32)
CREATININE: 0.7 mg/dL (ref 0.50–1.10)
Calcium: 9.4 mg/dL (ref 8.4–10.5)
Chloride: 104 mEq/L (ref 96–112)
GFR, Est Non African American: >89 mL/min
Glucose, Bld: 66 mg/dL — ABNORMAL LOW (ref 70–99)
Potassium: 4.3 mEq/L (ref 3.5–5.3)
Sodium: 134 mEq/L — ABNORMAL LOW (ref 135–145)
Total Protein: 8.7 g/dL — ABNORMAL HIGH (ref 6.0–8.3)

## 2014-01-31 LAB — RPR

## 2014-01-31 NOTE — Assessment & Plan Note (Signed)
Likely from immune reconstitution.  Topical therapy.

## 2014-01-31 NOTE — Addendum Note (Signed)
Addended by: Dolan Amen D on: 01/31/2014 02:55 PM   Modules accepted: Orders

## 2014-01-31 NOTE — Assessment & Plan Note (Signed)
Will check labs today.  RTC 3 month unless concerns on today's labs.

## 2014-01-31 NOTE — Addendum Note (Signed)
Addended by: Landis Gandy on: 01/31/2014 02:46 PM   Modules accepted: Orders

## 2014-01-31 NOTE — Progress Notes (Signed)
   Subjective:    Patient ID: Briana [name], female    DOB: 1976/03/02, 38 y.o.   MRN: [id number]  HPI  Here for follow up of HIV.  Had been on Prezista, norvir and truvada but unfortunately had been out of medications due to copay issues and then insurance issues.  She has reapplied for ADAP and now has been on meds over 2 months.  No missed doses.  Has had a rash on face, itchy.     Review of Systems  Constitutional: Negative for fatigue and unexpected weight change.  Gastrointestinal: Negative for nausea and diarrhea.  Skin: Negative for rash.  Psychiatric/Behavioral: Negative for dysphoric mood.       Objective:   Physical Exam  Constitutional: She is oriented to person, place, and time. She appears well-developed and well-nourished. No distress.  HENT:  Mouth/Throat: No oropharyngeal exudate.  Eyes: No scleral icterus.  Cardiovascular: Normal rate, regular rhythm and normal heart sounds.   No murmur heard. Pulmonary/Chest: Effort normal and breath sounds normal. No respiratory distress. She has no wheezes.  Lymphadenopathy:    She has no cervical adenopathy.  Neurological: She is alert and oriented to person, place, and time.  Skin: No rash noted.  Psychiatric: She has a normal mood and affect. Her behavior is normal.          Assessment & Plan:

## 2014-02-01 ENCOUNTER — Telehealth: Payer: Self-pay | Admitting: *Deleted

## 2014-02-01 LAB — HIV-1 RNA QUANT-NO REFLEX-BLD
HIV 1 RNA QUANT: 1216 {copies}/mL — AB (ref <20)
HIV-1 RNA QUANT, LOG: 3.08 {Log} — AB (ref <1.30)

## 2014-02-01 LAB — URINALYSIS, ROUTINE W REFLEX MICROSCOPIC
BILIRUBIN URINE: NEGATIVE
Glucose, UA: NEGATIVE mg/dL
Hgb urine dipstick: NEGATIVE
KETONES UR: NEGATIVE mg/dL
Leukocytes, UA: NEGATIVE
Nitrite: NEGATIVE
Protein, ur: NEGATIVE mg/dL
Specific Gravity, Urine: 1.016 (ref 1.005–1.030)
UROBILINOGEN UA: 0.2 mg/dL (ref 0.0–1.0)
pH: 7 (ref 5.0–8.0)

## 2014-02-01 LAB — T-HELPER CELL (CD4) - (RCID CLINIC ONLY)
CD4 % Helper T Cell: 10 % — ABNORMAL LOW (ref 33–55)
CD4 T CELL ABS: 140 /uL — AB (ref 400–2700)

## 2014-02-01 NOTE — Telephone Encounter (Signed)
Genotype added per Santiago Glad. Landis Gandy, RN

## 2014-02-01 NOTE — Telephone Encounter (Signed)
Message copied by Landis Gandy on Wed Feb 01, 2014  4:44 PM ------      Message from: Thayer Headings      Created: Wed Feb 01, 2014  4:36 PM       Please add a genotype to the viral load from yesterday.  thanks ------

## 2014-02-01 NOTE — Addendum Note (Signed)
Addended by: Landis Gandy on: 02/01/2014 04:43 PM   Modules accepted: Orders

## 2014-02-02 ENCOUNTER — Other Ambulatory Visit: Payer: Self-pay | Admitting: Internal Medicine

## 2014-02-02 ENCOUNTER — Other Ambulatory Visit: Payer: Self-pay | Admitting: Licensed Clinical Social Worker

## 2014-02-02 DIAGNOSIS — L309 Dermatitis, unspecified: Secondary | ICD-10-CM

## 2014-02-02 MED ORDER — METRONIDAZOLE 500 MG PO TABS: 500.0000 mg | ORAL_TABLET | Freq: Two times a day (BID) | ORAL | Status: DC

## 2014-02-02 MED ORDER — TRIAMCINOLONE ACETONIDE 0.1 % EX CREA: TOPICAL_CREAM | Freq: Two times a day (BID) | CUTANEOUS | Status: AC

## 2014-02-02 NOTE — Progress Notes (Signed)
I notified the patient.

## 2014-02-06 LAB — HLA B*5701: HLA-B*5701 w/rflx HLA-B High: NEGATIVE

## 2014-02-09 LAB — HIV-1 GENOTYPR PLUS

## 2014-02-10 ENCOUNTER — Telehealth: Payer: Self-pay | Admitting: Licensed Clinical Social Worker

## 2014-02-10 ENCOUNTER — Other Ambulatory Visit: Payer: Self-pay | Admitting: Licensed Clinical Social Worker

## 2014-02-10 DIAGNOSIS — B20 Human immunodeficiency virus [HIV] disease: Secondary | ICD-10-CM

## 2014-02-10 NOTE — Telephone Encounter (Signed)
Message copied by Janan Halter on Fri Feb 10, 2014  9:37 AM ------      Message from: Thayer Headings      Created: Thu Feb 09, 2014  5:45 PM       I would like to repeat the viral load the first of July to be sure it is suppressed.  She does not need to see me and can keep her 3 month follow up.  thanks ------

## 2014-02-10 NOTE — Telephone Encounter (Signed)
I left the patient a message and asked her to schedule an appointment for labwork per Dr. Linus Salmons

## 2014-04-11 ENCOUNTER — Ambulatory Visit: Payer: Self-pay

## 2014-05-04 ENCOUNTER — Ambulatory Visit: Payer: Self-pay | Admitting: Internal Medicine

## 2014-05-12 ENCOUNTER — Other Ambulatory Visit: Payer: Self-pay | Admitting: *Deleted

## 2014-05-12 DIAGNOSIS — B20 Human immunodeficiency virus [HIV] disease: Secondary | ICD-10-CM

## 2014-05-12 MED ORDER — EMTRICITABINE-TENOFOVIR DF 200-300 MG PO TABS: 1.0000 | ORAL_TABLET | Freq: Every day | ORAL | Status: DC

## 2014-05-12 MED ORDER — RITONAVIR 100 MG PO TABS: 100.0000 mg | ORAL_TABLET | Freq: Every day | ORAL | Status: DC

## 2014-05-12 MED ORDER — DARUNAVIR ETHANOLATE 800 MG PO TABS: 800.0000 mg | ORAL_TABLET | Freq: Every day | ORAL | Status: DC

## 2014-05-16 ENCOUNTER — Other Ambulatory Visit: Payer: Self-pay

## 2014-05-18 ENCOUNTER — Other Ambulatory Visit (INDEPENDENT_AMBULATORY_CARE_PROVIDER_SITE_OTHER): Payer: Self-pay

## 2014-05-18 DIAGNOSIS — B20 Human immunodeficiency virus [HIV] disease: Secondary | ICD-10-CM

## 2014-05-18 LAB — COMPLETE METABOLIC PANEL WITH GFR
ALT: 15 U/L (ref 0–35)
AST: 15 U/L (ref 0–37)
Albumin: 3.7 g/dL (ref 3.5–5.2)
Alkaline Phosphatase: 89 U/L (ref 39–117)
BUN: 10 mg/dL (ref 6–23)
CO2: 23 mEq/L (ref 19–32)
Calcium: 9.2 mg/dL (ref 8.4–10.5)
Chloride: 105 mEq/L (ref 96–112)
Creat: 0.72 mg/dL (ref 0.50–1.10)
GFR, Est African American: >89 mL/min
GFR, Est Non African American: >89 mL/min
Glucose, Bld: 93 mg/dL (ref 70–99)
Potassium: 4.3 mEq/L (ref 3.5–5.3)
SODIUM: 136 meq/L (ref 135–145)
TOTAL PROTEIN: 8.4 g/dL — AB (ref 6.0–8.3)
Total Bilirubin: 0.3 mg/dL (ref 0.2–1.2)

## 2014-05-18 LAB — CBC WITH DIFFERENTIAL/PLATELET
Basophils Absolute: 0.1 10*3/uL (ref 0.0–0.1)
Basophils Relative: 1 % (ref 0–1)
EOS ABS: 0.2 10*3/uL (ref 0.0–0.7)
EOS PCT: 3 % (ref 0–5)
HCT: 32.5 % — ABNORMAL LOW (ref 36.0–46.0)
Hemoglobin: 9.6 g/dL — ABNORMAL LOW (ref 12.0–15.0)
Lymphocytes Relative: 26 % (ref 12–46)
Lymphs Abs: 1.7 10*3/uL (ref 0.7–4.0)
MCH: 19.7 pg — AB (ref 26.0–34.0)
MCHC: 29.5 g/dL — ABNORMAL LOW (ref 30.0–36.0)
MCV: 66.7 fL — AB (ref 78.0–100.0)
MONOS PCT: 5 % (ref 3–12)
Monocytes Absolute: 0.3 10*3/uL (ref 0.1–1.0)
Neutro Abs: 4.3 10*3/uL (ref 1.7–7.7)
Neutrophils Relative %: 65 % (ref 43–77)
PLATELETS: 400 10*3/uL (ref 150–400)
RBC: 4.87 MIL/uL (ref 3.87–5.11)
RDW: 16.5 % — ABNORMAL HIGH (ref 11.5–15.5)
WBC: 6.6 10*3/uL (ref 4.0–10.5)

## 2014-05-19 LAB — T-HELPER CELL (CD4) - (RCID CLINIC ONLY)
CD4 % Helper T Cell: 11 % — ABNORMAL LOW (ref 33–55)
CD4 T Cell Abs: 180 /uL — ABNORMAL LOW (ref 400–2700)

## 2014-05-20 LAB — HIV-1 RNA QUANT-NO REFLEX-BLD: HIV 1 RNA Quant: <20 copies/mL (ref <20)

## 2014-05-23 ENCOUNTER — Ambulatory Visit (INDEPENDENT_AMBULATORY_CARE_PROVIDER_SITE_OTHER): Payer: Self-pay | Admitting: Internal Medicine

## 2014-05-23 ENCOUNTER — Encounter: Payer: Self-pay | Admitting: Internal Medicine

## 2014-05-23 VITALS — BP 137/90 | HR 66 | Temp 98.1°F | Wt 163.0 lb

## 2014-05-23 DIAGNOSIS — N898 Other specified noninflammatory disorders of vagina: Secondary | ICD-10-CM

## 2014-05-23 DIAGNOSIS — B20 Human immunodeficiency virus [HIV] disease: Secondary | ICD-10-CM

## 2014-05-23 DIAGNOSIS — R87613 High grade squamous intraepithelial lesion on cytologic smear of cervix (HGSIL): Secondary | ICD-10-CM

## 2014-05-23 DIAGNOSIS — Z23 Encounter for immunization: Secondary | ICD-10-CM

## 2014-05-23 DIAGNOSIS — K054 Periodontosis: Secondary | ICD-10-CM

## 2014-05-23 MED ORDER — SULFAMETHOXAZOLE-TMP DS 800-160 MG PO TABS: 1.0000 | ORAL_TABLET | Freq: Every day | ORAL | Status: DC

## 2014-05-23 NOTE — Assessment & Plan Note (Signed)
Back on track.  RTC 3 months with labs before.

## 2014-05-23 NOTE — Progress Notes (Signed)
   Subjective:    Patient ID: Briana [name], female    DOB: 1975-10-23, 38 y.o.   MRN: [id number]  HPI Here for follow up of HIV.  Had been on Prezista, norvir and truvada but unfortunately had been out of medications due to copay issues and then insurance issues.  She has reapplied for ADAP and now has been back on meds.  No missed doses.  Has had a rash on face.  Labs with CD4 of 180, now undetectable vl.  [facility] (hx of LEEP).     Has some vaginal discharge after condom got stuck in vagina.     Review of Systems  Constitutional: Negative for fatigue and unexpected weight change.  Gastrointestinal: Negative for nausea and diarrhea.  Skin: Negative for rash.  Psychiatric/Behavioral: Negative for dysphoric mood.       Objective:   Physical Exam  Constitutional: She is oriented to person, place, and time. She appears well-developed and well-nourished. No distress.  HENT:  Mouth/Throat: No oropharyngeal exudate.  Eyes: No scleral icterus.  Cardiovascular: Normal rate, regular rhythm and normal heart sounds.   No murmur heard. Pulmonary/Chest: Effort normal and breath sounds normal. No respiratory distress. She has no wheezes.  Lymphadenopathy:    She has no cervical adenopathy.  Neurological: She is alert and oriented to person, place, and time.  Skin: No rash noted.  Psychiatric: She has a normal mood and affect. Her behavior is normal.          Assessment & Plan:

## 2014-05-23 NOTE — Assessment & Plan Note (Signed)
She is going to call this week for follow up appt.

## 2014-05-23 NOTE — Assessment & Plan Note (Signed)
Will check cytology, UA, treat accordingly

## 2014-05-24 ENCOUNTER — Telehealth: Payer: Self-pay | Admitting: Licensed Clinical Social Worker

## 2014-05-24 LAB — URINALYSIS, ROUTINE W REFLEX MICROSCOPIC
Bilirubin Urine: NEGATIVE
Glucose, UA: NEGATIVE mg/dL
Hgb urine dipstick: NEGATIVE
Ketones, ur: NEGATIVE mg/dL
Nitrite: NEGATIVE
Protein, ur: NEGATIVE mg/dL
SPECIFIC GRAVITY, URINE: 1.029 (ref 1.005–1.030)
UROBILINOGEN UA: 0.2 mg/dL (ref 0.0–1.0)
pH: 6.5 (ref 5.0–8.0)

## 2014-05-24 LAB — URINALYSIS, MICROSCOPIC ONLY
Bacteria, UA: NONE SEEN
CASTS: NONE SEEN
CRYSTALS: NONE SEEN
SQUAMOUS EPITHELIAL / LPF: NONE SEEN

## 2014-05-24 NOTE — Telephone Encounter (Signed)
Patient was seen yesterday and gave urine sample to be sent to [facility] to check for Trichomonas, Yeast, GC/Chlamydia, and Gardnerella. Lab was only able to run test for Trichomonas, GC/CHL. Provider notified.

## 2014-05-25 ENCOUNTER — Telehealth: Payer: Self-pay | Admitting: *Deleted

## 2014-05-25 ENCOUNTER — Other Ambulatory Visit: Payer: Self-pay | Admitting: Internal Medicine

## 2014-05-25 LAB — URINE CYTOLOGY ANCILLARY ONLY
Chlamydia: NEGATIVE
NEISSERIA GONORRHEA: NEGATIVE
TRICH (WINDOWPATH): POSITIVE — AB

## 2014-05-25 MED ORDER — METRONIDAZOLE 500 MG PO TABS: 2000.0000 mg | ORAL_TABLET | Freq: Once | ORAL | Status: DC

## 2014-05-25 NOTE — Progress Notes (Signed)
Spoke with patient, relayed information.

## 2014-05-25 NOTE — Telephone Encounter (Signed)
Relayed the following results to patient per Dr. Linus Salmons: _______________ She was positive for trichomoniasis. I have sent Flagyl to the pharmacy, 4 pills one dose. Her partner should also be treated by his PCP. thanks

## 2014-06-06 ENCOUNTER — Telehealth: Payer: Self-pay | Admitting: *Deleted

## 2014-06-06 NOTE — Telephone Encounter (Signed)
Patient took 2 gm flagyl as directed on 9/27, states she is still having discharge.  She has not been sexually active since receiving the diagnosis (reports she told her partner to get treated by his doctor, they are no longer together).   Please advise.

## 2014-06-07 NOTE — Telephone Encounter (Signed)
Should probably see her gynecologist.

## 2014-06-08 NOTE — Telephone Encounter (Signed)
Advised her to follow up with gynecology. She is overdue for her annual pap with them as well.  Pt agreed.

## 2014-07-03 ENCOUNTER — Encounter: Payer: Self-pay | Admitting: Internal Medicine

## 2014-08-22 ENCOUNTER — Other Ambulatory Visit: Payer: Self-pay

## 2014-08-29 ENCOUNTER — Other Ambulatory Visit (INDEPENDENT_AMBULATORY_CARE_PROVIDER_SITE_OTHER): Payer: Self-pay

## 2014-08-29 DIAGNOSIS — Z113 Encounter for screening for infections with a predominantly sexual mode of transmission: Secondary | ICD-10-CM

## 2014-08-29 DIAGNOSIS — Z79899 Other long term (current) drug therapy: Secondary | ICD-10-CM

## 2014-08-29 DIAGNOSIS — B20 Human immunodeficiency virus [HIV] disease: Secondary | ICD-10-CM

## 2014-08-29 LAB — CBC WITH DIFFERENTIAL/PLATELET
Basophils Absolute: 0.1 10*3/uL (ref 0.0–0.1)
Basophils Relative: 1 % (ref 0–1)
Eosinophils Absolute: 0.2 10*3/uL (ref 0.0–0.7)
Eosinophils Relative: 3 % (ref 0–5)
HEMATOCRIT: 29.5 % — AB (ref 36.0–46.0)
Hemoglobin: 8.6 g/dL — ABNORMAL LOW (ref 12.0–15.0)
LYMPHS PCT: 23 % (ref 12–46)
Lymphs Abs: 1.8 10*3/uL (ref 0.7–4.0)
MCH: 19.5 pg — ABNORMAL LOW (ref 26.0–34.0)
MCHC: 29.2 g/dL — ABNORMAL LOW (ref 30.0–36.0)
MCV: 66.9 fL — AB (ref 78.0–100.0)
MONO ABS: 0.5 10*3/uL (ref 0.1–1.0)
MPV: 9.4 fL (ref 8.6–12.4)
Monocytes Relative: 7 % (ref 3–12)
Neutro Abs: 5.1 10*3/uL (ref 1.7–7.7)
Neutrophils Relative %: 66 % (ref 43–77)
PLATELETS: 475 10*3/uL — AB (ref 150–400)
RBC: 4.41 MIL/uL (ref 3.87–5.11)
RDW: 16.8 % — ABNORMAL HIGH (ref 11.5–15.5)
WBC: 7.8 10*3/uL (ref 4.0–10.5)

## 2014-08-29 LAB — COMPLETE METABOLIC PANEL WITH GFR
ALT: 10 U/L (ref 0–35)
AST: 14 U/L (ref 0–37)
Albumin: 3.6 g/dL (ref 3.5–5.2)
Alkaline Phosphatase: 88 U/L (ref 39–117)
BILIRUBIN TOTAL: 0.2 mg/dL (ref 0.2–1.2)
BUN: 10 mg/dL (ref 6–23)
CHLORIDE: 105 meq/L (ref 96–112)
CO2: 23 mEq/L (ref 19–32)
CREATININE: 0.68 mg/dL (ref 0.50–1.10)
Calcium: 9.5 mg/dL (ref 8.4–10.5)
GLUCOSE: 80 mg/dL (ref 70–99)
Potassium: 4.4 mEq/L (ref 3.5–5.3)
Sodium: 136 mEq/L (ref 135–145)
Total Protein: 8 g/dL (ref 6.0–8.3)

## 2014-08-29 LAB — LIPID PANEL
Cholesterol: 207 mg/dL — ABNORMAL HIGH (ref 0–200)
HDL: 47 mg/dL (ref >39)
LDL CALC: 136 mg/dL — AB (ref 0–99)
Total CHOL/HDL Ratio: 4.4 Ratio
Triglycerides: 121 mg/dL (ref <150)
VLDL: 24 mg/dL (ref 0–40)

## 2014-08-29 LAB — RPR

## 2014-08-30 LAB — T-HELPER CELL (CD4) - (RCID CLINIC ONLY)
CD4 % Helper T Cell: 13 % — ABNORMAL LOW (ref 33–55)
CD4 T CELL ABS: 220 /uL — AB (ref 400–2700)

## 2014-08-31 LAB — HIV-1 RNA QUANT-NO REFLEX-BLD

## 2014-09-04 ENCOUNTER — Ambulatory Visit: Payer: Self-pay | Admitting: Internal Medicine

## 2014-09-08 ENCOUNTER — Telehealth: Payer: Self-pay | Admitting: *Deleted

## 2014-09-08 NOTE — Telephone Encounter (Signed)
Patient called stating she was not able to get into [facility] clinic because she is uninsured. [employer] and was told they no longer accept the orange discount card. Spoke to [place] with [place] program [phone] and she will call patient and schedule for gyn clinic at the [facility] on 10/02/14 at 6:00 pm. Her last pap 04/2013 was normal. If she has an abnormal and needs a colposcopy [facility] will arrange this. Myrtis Hopping CMA

## 2014-09-12 ENCOUNTER — Ambulatory Visit (INDEPENDENT_AMBULATORY_CARE_PROVIDER_SITE_OTHER): Payer: Self-pay | Admitting: Internal Medicine

## 2014-09-12 ENCOUNTER — Encounter: Payer: Self-pay | Admitting: Internal Medicine

## 2014-09-12 VITALS — BP 131/88 | HR 87 | Temp 97.8°F | Wt 163.0 lb

## 2014-09-12 DIAGNOSIS — R87613 High grade squamous intraepithelial lesion on cytologic smear of cervix (HGSIL): Secondary | ICD-10-CM

## 2014-09-12 DIAGNOSIS — B20 Human immunodeficiency virus [HIV] disease: Secondary | ICD-10-CM

## 2014-09-12 DIAGNOSIS — D509 Iron deficiency anemia, unspecified: Secondary | ICD-10-CM

## 2014-09-12 NOTE — Assessment & Plan Note (Signed)
She has not been taking her iron well and we'll restart that.

## 2014-09-12 NOTE — Assessment & Plan Note (Signed)
She has a follow-up appointment scheduled for repeat Pap smear at the cancer center.

## 2014-09-12 NOTE — Progress Notes (Signed)
   Subjective:    Patient ID: Briana [name], female    DOB: Jan 11, 1976, 39 y.o.   MRN: [id number]  HPI Here for follow up of HIV.  Had been on Prezista, norvir and truvada but unfortunately was out of medications due to copay issues and then insurance issues.  She has reapplied for ADAP and now has been back on meds for several months.  No missed doses.  No new issues. CD4 now up to 220 and viral load remains undetectable.     Review of Systems  Constitutional: Negative for fatigue and unexpected weight change.  Gastrointestinal: Negative for nausea and diarrhea.  Skin: Negative for rash.  Psychiatric/Behavioral: Negative for dysphoric mood.       Objective:   Physical Exam  Constitutional: She appears well-developed and well-nourished. No distress.  HENT:  Mouth/Throat: No oropharyngeal exudate.  Eyes: No scleral icterus.  Cardiovascular: Normal rate, regular rhythm and normal heart sounds.   No murmur heard. Pulmonary/Chest: Effort normal and breath sounds normal. No respiratory distress.  Lymphadenopathy:    She has no cervical adenopathy.  Skin: No rash noted.          Assessment & Plan:

## 2014-09-12 NOTE — Assessment & Plan Note (Signed)
She is doing great. We'll continue same regimen and Bactrim. She can stop Bactrim next visit if she remains over 200.

## 2014-09-20 ENCOUNTER — Ambulatory Visit: Payer: Self-pay

## 2014-09-25 ENCOUNTER — Ambulatory Visit: Payer: Self-pay

## 2014-10-10 ENCOUNTER — Ambulatory Visit: Payer: Self-pay

## 2014-10-10 ENCOUNTER — Other Ambulatory Visit: Payer: Self-pay | Admitting: *Deleted

## 2014-10-10 DIAGNOSIS — B20 Human immunodeficiency virus [HIV] disease: Secondary | ICD-10-CM

## 2014-10-10 MED ORDER — DARUNAVIR ETHANOLATE 800 MG PO TABS: 800.0000 mg | ORAL_TABLET | Freq: Every day | ORAL | Status: DC

## 2014-10-10 MED ORDER — EMTRICITABINE-TENOFOVIR DF 200-300 MG PO TABS: 1.0000 | ORAL_TABLET | Freq: Every day | ORAL | Status: DC

## 2014-10-10 MED ORDER — RITONAVIR 100 MG PO TABS: 100.0000 mg | ORAL_TABLET | Freq: Every day | ORAL | Status: DC

## 2014-10-10 NOTE — Telephone Encounter (Signed)
ADAP Application 

## 2014-11-06 ENCOUNTER — Telehealth: Payer: Self-pay | Admitting: *Deleted

## 2014-11-06 NOTE — Telephone Encounter (Signed)
Patient had a PAP on 10/02/14, but only just received the results in the mail last week. She states her PAP was within normal limits, but it is noted that she is positive for trichomoniasis and they are asking her to contact her doctor for treatment (not the PAP clinic). Please advise.  Landis Gandy, RN

## 2014-11-07 ENCOUNTER — Other Ambulatory Visit: Payer: Self-pay | Admitting: Internal Medicine

## 2014-11-07 MED ORDER — METRONIDAZOLE 500 MG PO TABS: 2000.0000 mg | ORAL_TABLET | Freq: Once | ORAL | Status: DC

## 2014-11-08 NOTE — Progress Notes (Signed)
Notified patient. Thanks! 

## 2014-11-08 NOTE — Telephone Encounter (Signed)
Received the order from Dr. Linus Salmons.  Notified patient.   "Flagyl sent, 4 tabs once. She should have any partners treated."

## 2014-11-29 ENCOUNTER — Other Ambulatory Visit (INDEPENDENT_AMBULATORY_CARE_PROVIDER_SITE_OTHER): Payer: Self-pay

## 2014-11-29 DIAGNOSIS — B20 Human immunodeficiency virus [HIV] disease: Secondary | ICD-10-CM

## 2014-11-30 LAB — T-HELPER CELL (CD4) - (RCID CLINIC ONLY)
CD4 % Helper T Cell: 14 % — ABNORMAL LOW (ref 33–55)
CD4 T CELL ABS: 240 /uL — AB (ref 400–2700)

## 2014-11-30 LAB — HIV-1 RNA QUANT-NO REFLEX-BLD
HIV 1 RNA Quant: 36 copies/mL — ABNORMAL HIGH (ref <20)
HIV-1 RNA QUANT, LOG: 1.56 {Log} — AB (ref <1.30)

## 2014-12-12 ENCOUNTER — Ambulatory Visit: Payer: Self-pay | Admitting: Internal Medicine

## 2014-12-13 ENCOUNTER — Other Ambulatory Visit: Payer: Self-pay | Admitting: Internal Medicine

## 2014-12-13 DIAGNOSIS — B20 Human immunodeficiency virus [HIV] disease: Secondary | ICD-10-CM

## 2015-01-30 ENCOUNTER — Ambulatory Visit: Payer: Self-pay | Admitting: Internal Medicine

## 2015-06-18 ENCOUNTER — Ambulatory Visit: Payer: Self-pay

## 2015-06-18 ENCOUNTER — Other Ambulatory Visit: Payer: Self-pay

## 2015-06-19 ENCOUNTER — Other Ambulatory Visit (INDEPENDENT_AMBULATORY_CARE_PROVIDER_SITE_OTHER): Payer: Self-pay

## 2015-06-19 ENCOUNTER — Ambulatory Visit: Payer: Self-pay

## 2015-06-19 ENCOUNTER — Other Ambulatory Visit: Payer: Self-pay | Admitting: *Deleted

## 2015-06-19 DIAGNOSIS — B20 Human immunodeficiency virus [HIV] disease: Secondary | ICD-10-CM

## 2015-06-19 DIAGNOSIS — Z113 Encounter for screening for infections with a predominantly sexual mode of transmission: Secondary | ICD-10-CM

## 2015-06-19 DIAGNOSIS — Z79899 Other long term (current) drug therapy: Secondary | ICD-10-CM

## 2015-06-19 LAB — COMPLETE METABOLIC PANEL WITH GFR
ALBUMIN: 3.9 g/dL (ref 3.6–5.1)
ALT: 9 U/L (ref 6–29)
AST: 16 U/L (ref 10–30)
Alkaline Phosphatase: 103 U/L (ref 33–115)
BILIRUBIN TOTAL: 0.4 mg/dL (ref 0.2–1.2)
BUN: 7 mg/dL (ref 7–25)
CO2: 24 mmol/L (ref 20–31)
CREATININE: 0.65 mg/dL (ref 0.50–1.10)
Calcium: 9.5 mg/dL (ref 8.6–10.2)
Chloride: 105 mmol/L (ref 98–110)
GFR, Est African American: >89 mL/min (ref >=60)
GLUCOSE: 82 mg/dL (ref 65–99)
Potassium: 3.9 mmol/L (ref 3.5–5.3)
SODIUM: 137 mmol/L (ref 135–146)
TOTAL PROTEIN: 8.1 g/dL (ref 6.1–8.1)

## 2015-06-19 LAB — CBC WITH DIFFERENTIAL/PLATELET
BASOS ABS: 0.1 10*3/uL (ref 0.0–0.1)
BASOS PCT: 1 % (ref 0–1)
Eosinophils Absolute: 0.2 10*3/uL (ref 0.0–0.7)
Eosinophils Relative: 3 % (ref 0–5)
HCT: 29.2 % — ABNORMAL LOW (ref 36.0–46.0)
HEMOGLOBIN: 8.6 g/dL — AB (ref 12.0–15.0)
LYMPHS ABS: 1.7 10*3/uL (ref 0.7–4.0)
Lymphocytes Relative: 23 % (ref 12–46)
MCH: 18.7 pg — AB (ref 26.0–34.0)
MCHC: 29.5 g/dL — ABNORMAL LOW (ref 30.0–36.0)
MCV: 63.3 fL — ABNORMAL LOW (ref 78.0–100.0)
MONOS PCT: 6 % (ref 3–12)
MPV: 9.5 fL (ref 8.6–12.4)
Monocytes Absolute: 0.5 10*3/uL (ref 0.1–1.0)
NEUTROS ABS: 5 10*3/uL (ref 1.7–7.7)
NEUTROS PCT: 67 % (ref 43–77)
Platelets: 417 10*3/uL — ABNORMAL HIGH (ref 150–400)
RBC: 4.61 MIL/uL (ref 3.87–5.11)
RDW: 17.1 % — ABNORMAL HIGH (ref 11.5–15.5)
WBC: 7.5 10*3/uL (ref 4.0–10.5)

## 2015-06-19 LAB — LIPID PANEL
Cholesterol: 186 mg/dL (ref 125–200)
HDL: 43 mg/dL — ABNORMAL LOW (ref >=46)
LDL Cholesterol: 123 mg/dL (ref <130)
Total CHOL/HDL Ratio: 4.3 Ratio (ref <=5.0)
Triglycerides: 101 mg/dL (ref <150)
VLDL: 20 mg/dL (ref <30)

## 2015-06-19 MED ORDER — DARUNAVIR ETHANOLATE 800 MG PO TABS: 800.0000 mg | ORAL_TABLET | Freq: Every day | ORAL | Status: DC

## 2015-06-19 MED ORDER — EMTRICITABINE-TENOFOVIR DF 200-300 MG PO TABS: 1.0000 | ORAL_TABLET | Freq: Every day | ORAL | Status: DC

## 2015-06-19 MED ORDER — RITONAVIR 100 MG PO TABS: 100.0000 mg | ORAL_TABLET | Freq: Every day | ORAL | Status: DC

## 2015-06-20 LAB — URINE CYTOLOGY ANCILLARY ONLY
CHLAMYDIA, DNA PROBE: NEGATIVE
Neisseria Gonorrhea: NEGATIVE

## 2015-06-20 LAB — T-HELPER CELL (CD4) - (RCID CLINIC ONLY)
CD4 T CELL ABS: 340 /uL — AB (ref 400–2700)
CD4 T CELL HELPER: 19 % — AB (ref 33–55)

## 2015-06-20 LAB — RPR

## 2015-06-21 LAB — HIV-1 RNA QUANT-NO REFLEX-BLD
HIV 1 RNA Quant: 2491 copies/mL — ABNORMAL HIGH (ref <20)
HIV-1 RNA QUANT, LOG: 3.4 {Log_copies}/mL — AB (ref <1.30)

## 2015-06-25 ENCOUNTER — Telehealth: Payer: Self-pay | Admitting: *Deleted

## 2015-06-25 ENCOUNTER — Other Ambulatory Visit: Payer: Self-pay | Admitting: Internal Medicine

## 2015-06-25 DIAGNOSIS — Z21 Asymptomatic human immunodeficiency virus [HIV] infection status: Secondary | ICD-10-CM

## 2015-06-25 NOTE — Telephone Encounter (Signed)
Called the patient to advise her that her medication has been delivered had to leave a message.

## 2015-06-26 ENCOUNTER — Telehealth: Payer: Self-pay | Admitting: *Deleted

## 2015-06-26 ENCOUNTER — Other Ambulatory Visit: Payer: Self-pay | Admitting: *Deleted

## 2015-06-26 DIAGNOSIS — Z21 Asymptomatic human immunodeficiency virus [HIV] infection status: Secondary | ICD-10-CM

## 2015-06-26 MED ORDER — DOLUTEGRAVIR SODIUM 50 MG PO TABS: 50.0000 mg | ORAL_TABLET | Freq: Every day | ORAL | Status: DC

## 2015-06-26 NOTE — Addendum Note (Signed)
Addended by: Laverle Patter on: 06/26/2015 12:51 PM   Modules accepted: Orders

## 2015-06-26 NOTE — Telephone Encounter (Signed)
-----   Message from Thayer Headings, MD sent at 06/25/2015  5:06 PM EDT ----- I sent a message last week.  She is failing so should have Tivicay added to her current regimen (prezista/norvir and truavada).  Thanks ----- Message -----    From: Georgena Spurling, CMA    Sent: 06/25/2015   4:13 PM      To: Thayer Headings, MD  Santiago Glad didn't know anything about it; she is added it now. What are you asking about the Tivicay is the patient suppose to be taking it. Not on her med list. Myrtis Hopping  ----- Message -----    From: Thayer Headings, MD    Sent: 06/25/2015  10:46 AM      To: [facility] Triage Nurse Pool  Were we able to add on a genotype?  I don't see it yet.  Was she able to add Tivicay? thanks

## 2015-06-26 NOTE — Telephone Encounter (Signed)
Patient returned call.  She does not have a pharmacy and is currently getting medications through patient assistance.  She was advised she will need to come to see Junie Panning to apply for assistance with the Tivicay that was added to regimen.  I will make sure a script is prepared.   Patient states she will be able to come on Wednesday 06-27-15.

## 2015-06-26 NOTE — Telephone Encounter (Signed)
Left patient a voice mail to call the nurse triage line. Please inform her about the new medication and send to the pharmacy of her choice. Myrtis Hopping

## 2015-07-02 ENCOUNTER — Telehealth: Payer: Self-pay | Admitting: *Deleted

## 2015-07-02 NOTE — Telephone Encounter (Signed)
Called the patient and left her a message that her medication is ready to be picked up Tivicay added to her meds.

## 2015-07-11 ENCOUNTER — Ambulatory Visit: Payer: Self-pay | Admitting: Internal Medicine

## 2015-07-24 NOTE — Progress Notes (Signed)
Notified [facility] via fax. Izel Hochberg M, RN 

## 2015-08-22 ENCOUNTER — Other Ambulatory Visit: Payer: Self-pay | Admitting: Internal Medicine

## 2015-09-11 ENCOUNTER — Encounter (HOSPITAL_BASED_OUTPATIENT_CLINIC_OR_DEPARTMENT_OTHER): Payer: Self-pay | Admitting: *Deleted

## 2015-09-11 DIAGNOSIS — Z79899 Other long term (current) drug therapy: Secondary | ICD-10-CM | POA: Insufficient documentation

## 2015-09-11 DIAGNOSIS — M109 Gout, unspecified: Secondary | ICD-10-CM | POA: Insufficient documentation

## 2015-09-11 DIAGNOSIS — D649 Anemia, unspecified: Secondary | ICD-10-CM | POA: Insufficient documentation

## 2015-09-11 DIAGNOSIS — B20 Human immunodeficiency virus [HIV] disease: Secondary | ICD-10-CM | POA: Insufficient documentation

## 2015-09-11 DIAGNOSIS — N189 Chronic kidney disease, unspecified: Secondary | ICD-10-CM | POA: Insufficient documentation

## 2015-09-11 NOTE — ED Notes (Signed)
Pt c/o right foot pain x 5 hrs denies injury

## 2015-09-12 ENCOUNTER — Emergency Department (HOSPITAL_BASED_OUTPATIENT_CLINIC_OR_DEPARTMENT_OTHER)
Admission: EM | Admit: 2015-09-12 | Discharge: 2015-09-12 | Disposition: A | Discharge status: Home / Self Care | Payer: Self-pay | Attending: Emergency Medicine | Admitting: Emergency Medicine

## 2015-09-12 DIAGNOSIS — M109 Gout, unspecified: Secondary | ICD-10-CM

## 2015-09-12 MED ORDER — NAPROXEN 375 MG PO TABS: ORAL_TABLET | ORAL | Status: DC

## 2015-09-12 MED ORDER — NAPROXEN 250 MG PO TABS
500.0000 mg | ORAL_TABLET | Freq: Once | ORAL | Status: AC
  Administered 2015-09-12: 500 mg via ORAL
  Filled 2015-09-12: qty 2

## 2015-09-12 MED ORDER — HYDROCODONE-ACETAMINOPHEN 5-325 MG PO TABS: 1.0000 | ORAL_TABLET | Freq: Four times a day (QID) | ORAL | Status: DC | PRN

## 2015-09-12 NOTE — Discharge Instructions (Signed)

## 2015-09-12 NOTE — ED Provider Notes (Signed)
CSN: [id number]     Arrival date & time 09/11/15  2212 History   First MD Initiated Contact with Patient 09/12/15 0104     Chief Complaint  Patient presents with  . Foot Pain     (Consider location/radiation/quality/duration/timing/severity/associated sxs/prior Treatment) HPI  This is a 40 year old female on multiple antiretrovirals for HIV infection. She is here with pain in the base of her right great toe since about 4 PM yesterday evening. The pain is severe and worse with movement or palpation. There was no triggering trauma. There is no associated deformity, erythema or warmth.  Past Medical History  Diagnosis Date  . HIV infection ([place])   . Screening for malignant neoplasm of the cervix   . Anemia   . Abnormal Pap smear     s/p colposcopy   . Chronic kidney disease 03/2013    kidney infection   Past Surgical History  Procedure Laterality Date  . Tubal ligation    . Cosmetic surgery      2 previous c-sections  . Cesarean section      2000/2008   Family History  Problem Relation Age of Onset  . Diabetes Maternal Grandmother   . Hypertension Maternal Grandmother   . Diabetes Maternal Grandfather   . Hypertension Maternal Grandfather   . Diabetes Paternal Grandmother   . Hypertension Paternal Grandmother   . Diabetes Paternal Grandfather   . Hypertension Paternal Grandfather   . Eczema Mother   . Diabetes Father   . Hypertension Father    Social History  Substance Use Topics  . Smoking status: Never Smoker   . Smokeless tobacco: Never Used  . Alcohol Use: No   OB History    Gravida Para Term Preterm AB TAB SAB Ectopic Multiple Living   3 3 3      1 4      Review of Systems  All other systems reviewed and are negative.   Allergies  Review of patient's allergies indicates no known allergies.  Home Medications   Prior to Admission medications   Medication Sig Start Date End Date Taking? Authorizing Provider  Darunavir Ethanolate (PREZISTA) 800 MG tablet  Take 1 tablet (800 mg total) by mouth daily. 06/19/15   Thayer Headings, MD  dolutegravir (TIVICAY) 50 MG tablet Take 1 tablet (50 mg total) by mouth daily. 06/26/15   Thayer Headings, MD  emtricitabine-tenofovir (TRUVADA) 200-300 MG tablet Take 1 tablet by mouth daily. 06/19/15   Thayer Headings, MD  IRON, FERROUS GLUCONATE, PO Take 2 tablets by mouth daily.    Historical Provider, MD  ritonavir (NORVIR) 100 MG TABS tablet Take 1 tablet (100 mg total) by mouth daily. 06/19/15   Thayer Headings, MD   BP 153/92 mmHg  Pulse 102  Temp(Src) 98.3 F (36.8 C) (Oral)  Resp 20  Ht 5\' 5"  (1.651 m)  Wt 160 lb (72.576 kg)  BMI 26.63 kg/m2  SpO2 98%   Physical Exam  General: Well-developed, well-nourished female in no acute distress; appearance consistent with age of record HENT: normocephalic; atraumatic Eyes: pupils equal, round and reactive to light; extraocular muscles intact Neck: supple Heart: regular rate and rhythm Lungs: clear to auscultation bilaterally Abdomen: soft; nondistended; nontender Extremities: No deformity; full range of motion; pulses normal; tenderness of the right first MTP joint without deformity, swelling, erythema or warmth Neurologic: Awake, alert and oriented; motor function intact in all extremities and symmetric; no facial droop Skin: Warm and dry Psychiatric: Normal mood and  affect    ED Course  Procedures (including critical care time)   MDM  Examination consistent with gout.    Shanon Rosser, MD 09/12/15 0111

## 2015-09-14 ENCOUNTER — Telehealth: Payer: Self-pay | Admitting: *Deleted

## 2015-09-14 NOTE — Telephone Encounter (Signed)
RN received referral for patient from Dr Linus Salmons. Referral placed due to unsuppressed virus. RN contacted the patient leaving a message stating my name and that I was a nurse following up with her. RN stated "everything is fine" so the patient will not hear message and become concerned. RN did ask the patient to return my call. RN will f/u with the patient again next week if I do not hear from the patient

## 2015-10-04 ENCOUNTER — Telehealth: Payer: Self-pay | Admitting: *Deleted

## 2015-10-16 NOTE — Telephone Encounter (Signed)
RN contacted the patient to follow up on a referral placed by Dr Johnnye Sima for [facility]. RN had to leave a message asking the patient to please return my call. RN advised that nothing is wrong but I would love to f/u with her and offer any additional help if needed

## 2016-01-22 ENCOUNTER — Other Ambulatory Visit: Payer: Self-pay | Admitting: Internal Medicine

## 2016-01-22 DIAGNOSIS — B20 Human immunodeficiency virus [HIV] disease: Secondary | ICD-10-CM

## 2016-01-22 DIAGNOSIS — Z21 Asymptomatic human immunodeficiency virus [HIV] infection status: Secondary | ICD-10-CM

## 2016-01-23 ENCOUNTER — Telehealth: Payer: Self-pay | Admitting: *Deleted

## 2016-01-23 MED ORDER — DOLUTEGRAVIR SODIUM 50 MG PO TABS: 50.0000 mg | ORAL_TABLET | Freq: Every day | ORAL | Status: DC

## 2016-01-23 NOTE — Telephone Encounter (Signed)
Pt has not had an appointment with Dr.Comer since 09/2014.  In order to renew ADAP/RW the patient needs an MD appt and lab work.  Message left for the pt call [facility] to make these appointments prior to renewing ADAP and RW.

## 2016-01-29 ENCOUNTER — Ambulatory Visit: Payer: Self-pay

## 2016-01-29 ENCOUNTER — Other Ambulatory Visit: Payer: Self-pay

## 2016-01-29 DIAGNOSIS — B20 Human immunodeficiency virus [HIV] disease: Secondary | ICD-10-CM

## 2016-01-29 DIAGNOSIS — Z21 Asymptomatic human immunodeficiency virus [HIV] infection status: Secondary | ICD-10-CM

## 2016-01-29 DIAGNOSIS — Z79899 Other long term (current) drug therapy: Secondary | ICD-10-CM

## 2016-01-29 DIAGNOSIS — Z113 Encounter for screening for infections with a predominantly sexual mode of transmission: Secondary | ICD-10-CM

## 2016-01-29 NOTE — Addendum Note (Signed)
Addended by: Dolan Amen D on: 01/29/2016 05:44 PM   Modules accepted: Orders

## 2016-02-18 ENCOUNTER — Other Ambulatory Visit: Payer: Self-pay

## 2016-03-03 ENCOUNTER — Ambulatory Visit: Payer: Self-pay | Admitting: Internal Medicine

## 2016-10-27 ENCOUNTER — Encounter (HOSPITAL_BASED_OUTPATIENT_CLINIC_OR_DEPARTMENT_OTHER): Payer: Self-pay | Admitting: *Deleted

## 2016-10-27 ENCOUNTER — Emergency Department (HOSPITAL_BASED_OUTPATIENT_CLINIC_OR_DEPARTMENT_OTHER)
Admission: EM | Admit: 2016-10-27 | Discharge: 2016-10-27 | Disposition: A | Discharge status: Home / Self Care | Payer: BC Managed Care – PPO | Attending: Emergency Medicine | Admitting: Emergency Medicine

## 2016-10-27 DIAGNOSIS — N12 Tubulo-interstitial nephritis, not specified as acute or chronic: Secondary | ICD-10-CM | POA: Insufficient documentation

## 2016-10-27 DIAGNOSIS — N179 Acute kidney failure, unspecified: Secondary | ICD-10-CM

## 2016-10-27 DIAGNOSIS — E860 Dehydration: Secondary | ICD-10-CM | POA: Insufficient documentation

## 2016-10-27 DIAGNOSIS — Z79899 Other long term (current) drug therapy: Secondary | ICD-10-CM | POA: Diagnosis not present

## 2016-10-27 DIAGNOSIS — K529 Noninfective gastroenteritis and colitis, unspecified: Secondary | ICD-10-CM | POA: Insufficient documentation

## 2016-10-27 DIAGNOSIS — R197 Diarrhea, unspecified: Secondary | ICD-10-CM | POA: Diagnosis present

## 2016-10-27 DIAGNOSIS — Z21 Asymptomatic human immunodeficiency virus [HIV] infection status: Secondary | ICD-10-CM | POA: Diagnosis not present

## 2016-10-27 LAB — COMPREHENSIVE METABOLIC PANEL
ALK PHOS: 88 U/L (ref 38–126)
ALT: 16 U/L (ref 14–54)
AST: 40 U/L (ref 15–41)
Albumin: 3.6 g/dL (ref 3.5–5.0)
Anion gap: 12 (ref 5–15)
BUN: 24 mg/dL — AB (ref 6–20)
CALCIUM: 9 mg/dL (ref 8.9–10.3)
CHLORIDE: 102 mmol/L (ref 101–111)
CO2: 18 mmol/L — ABNORMAL LOW (ref 22–32)
CREATININE: 1.86 mg/dL — AB (ref 0.44–1.00)
GFR calc non Af Amer: 33 mL/min — ABNORMAL LOW (ref >60)
GFR, EST AFRICAN AMERICAN: 39 mL/min — AB (ref >60)
Glucose, Bld: 134 mg/dL — ABNORMAL HIGH (ref 65–99)
Potassium: 3.6 mmol/L (ref 3.5–5.1)
SODIUM: 132 mmol/L — AB (ref 135–145)
Total Bilirubin: 1.1 mg/dL (ref 0.3–1.2)
Total Protein: 10.4 g/dL — ABNORMAL HIGH (ref 6.5–8.1)

## 2016-10-27 LAB — URINALYSIS, ROUTINE W REFLEX MICROSCOPIC
GLUCOSE, UA: NEGATIVE mg/dL
KETONES UR: 15 mg/dL — AB
Nitrite: NEGATIVE
Protein, ur: >300 mg/dL — AB
Specific Gravity, Urine: 1.019 (ref 1.005–1.030)
pH: 5.5 (ref 5.0–8.0)

## 2016-10-27 LAB — URINALYSIS, MICROSCOPIC (REFLEX)

## 2016-10-27 LAB — CBC
HCT: 32.9 % — ABNORMAL LOW (ref 36.0–46.0)
HEMOGLOBIN: 9.7 g/dL — AB (ref 12.0–15.0)
MCH: 18.3 pg — ABNORMAL LOW (ref 26.0–34.0)
MCHC: 29.5 g/dL — ABNORMAL LOW (ref 30.0–36.0)
MCV: 62.2 fL — AB (ref 78.0–100.0)
PLATELETS: 351 10*3/uL (ref 150–400)
RBC: 5.29 MIL/uL — AB (ref 3.87–5.11)
RDW: 17.2 % — ABNORMAL HIGH (ref 11.5–15.5)
WBC: 15.1 10*3/uL — AB (ref 4.0–10.5)

## 2016-10-27 LAB — TROPONIN I: Troponin I: <0.03 ng/mL (ref <0.03)

## 2016-10-27 MED ORDER — SODIUM CHLORIDE 0.9 % IV BOLUS (SEPSIS)
1000.0000 mL | Freq: Once | INTRAVENOUS | Status: AC
  Administered 2016-10-27: 1000 mL via INTRAVENOUS

## 2016-10-27 MED ORDER — SODIUM CHLORIDE 0.9 % IV BOLUS (SEPSIS)
1000.0000 mL | Freq: Once | INTRAVENOUS | Status: AC
Start: 2016-10-27 — End: 2016-10-27
  Administered 2016-10-27: 1000 mL via INTRAVENOUS

## 2016-10-27 MED ORDER — CEFTRIAXONE SODIUM 250 MG IJ SOLR: 250.0000 mg | Freq: Once | INTRAMUSCULAR | Status: DC

## 2016-10-27 MED ORDER — ONDANSETRON HCL 4 MG/2ML IJ SOLN
4.0000 mg | Freq: Once | INTRAMUSCULAR | Status: AC
  Administered 2016-10-27: 4 mg via INTRAVENOUS

## 2016-10-27 MED ORDER — ONDANSETRON HCL 4 MG PO TABS: 4.0000 mg | ORAL_TABLET | Freq: Three times a day (TID) | ORAL | 0 refills | Status: DC | PRN

## 2016-10-27 MED ORDER — DEXTROSE 5 % IV SOLN
1.0000 g | Freq: Once | INTRAVENOUS | Status: AC
  Administered 2016-10-27: 1 g via INTRAVENOUS

## 2016-10-27 MED ORDER — IBUPROFEN 400 MG PO TABS
400.0000 mg | ORAL_TABLET | Freq: Once | ORAL | Status: AC
  Administered 2016-10-27: 400 mg via ORAL
  Filled 2016-10-27: qty 1

## 2016-10-27 MED ORDER — DEXTROSE 5 % IV SOLN
INTRAVENOUS | Status: AC
  Filled 2016-10-27: qty 10

## 2016-10-27 MED ORDER — ONDANSETRON 4 MG PO TBDP
4.0000 mg | ORAL_TABLET | Freq: Once | ORAL | Status: AC
  Administered 2016-10-27: 4 mg via ORAL
  Filled 2016-10-27: qty 1

## 2016-10-27 MED ORDER — ACETAMINOPHEN 500 MG PO TABS
1000.0000 mg | ORAL_TABLET | Freq: Once | ORAL | Status: AC
  Administered 2016-10-27: 1000 mg via ORAL
  Filled 2016-10-27: qty 2

## 2016-10-27 MED ORDER — KETOROLAC TROMETHAMINE 30 MG/ML IJ SOLN
15.0000 mg | Freq: Once | INTRAMUSCULAR | Status: AC
  Administered 2016-10-27: 19:00:00 via INTRAVENOUS
  Filled 2016-10-27: qty 1

## 2016-10-27 MED ORDER — CEPHALEXIN 500 MG PO CAPS
500.0000 mg | ORAL_CAPSULE | Freq: Four times a day (QID) | ORAL | 0 refills | Status: DC
Start: 2016-10-27 — End: 2016-11-28

## 2016-10-27 MED ORDER — ONDANSETRON HCL 4 MG/2ML IJ SOLN
INTRAMUSCULAR | Status: AC
  Filled 2016-10-27: qty 2

## 2016-10-27 NOTE — ED Notes (Signed)
ED Provider at bedside. 

## 2016-10-27 NOTE — ED Triage Notes (Signed)
Fever and vomiting x 2 days. Not able to eat or drink.

## 2016-10-27 NOTE — ED Provider Notes (Signed)
[facility] DEPT [facility] Provider Note   CSN: [id number] Arrival date & time: 10/27/16  1752     History   Chief Complaint Chief Complaint  Patient presents with  . Fever  . Emesis    HPI Briana [name] is a 41 y.o. female.   Fever   This is a new problem. The current episode started more than 2 days ago. The problem occurs daily. The problem has been gradually improving. Her temperature was unmeasured prior to arrival. Associated symptoms include diarrhea, vomiting, congestion, headaches, sore throat, muscle aches and cough. She has tried nothing for the symptoms.  Emesis   Associated symptoms include cough, diarrhea, a fever and headaches.    Past Medical History:  Diagnosis Date  . Abnormal Pap smear    s/p colposcopy   . Anemia   . Chronic kidney disease 03/2013   kidney infection  . HIV infection ([place])   . Screening for malignant neoplasm of the cervix     Patient Active Problem List   Diagnosis Date Noted  . Vaginal discharge 05/23/2014  . Rash and nonspecific skin eruption 01/31/2014  . Leukopenia 03/09/2013  . Eczema 09/11/2011  . 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 Microcytic anemia 01/23/2011  . Human immunodeficiency virus (HIV) disease ([place]) 10/19/2006    Past Surgical History:  Procedure Laterality Date  . CESAREAN SECTION     2000/2008  . COSMETIC SURGERY     2 previous c-sections  . TUBAL LIGATION      OB History    Gravida Para Term Preterm AB Living   3 3 3     4    SAB TAB Ectopic Multiple Live Births         1 3       Home Medications    Prior to Admission medications   Medication Sig Start Date End Date Taking? Authorizing Provider  cephALEXin (KEFLEX) 500 MG capsule Take 1 capsule (500 mg total) by mouth 4 (four) times daily. 10/27/16   Merrily Pew, MD  dolutegravir (TIVICAY) 50 MG tablet Take 1 tablet (50 mg total) by mouth daily. 01/23/16   Thayer Headings, MD  HYDROcodone-acetaminophen (NORCO) 5-325 MG tablet Take  1-2 tablets by mouth every 6 (six) hours as needed for severe pain. 09/12/15   John Molpus, MD  IRON, FERROUS GLUCONATE, PO Take 2 tablets by mouth daily.    Historical Provider, MD  naproxen (NAPROSYN) 375 MG tablet Take 1 tablet twice daily as needed for toe pain. Best taken with a meal. 09/12/15   Shanon Rosser, MD  NORVIR 100 MG TABS tablet TAKE 1 TABLET BY MOUTH DAILY 01/23/16   Thayer Headings, MD  ondansetron (ZOFRAN) 4 MG tablet Take 1 tablet (4 mg total) by mouth every 8 (eight) hours as needed for nausea or vomiting. 10/27/16   Merrily Pew, MD  PREZISTA 800 MG tablet TAKE 1 TABLET BY MOUTH DAILY 01/23/16   Thayer Headings, MD  TRUVADA 200-300 MG tablet TAKE 1 TABLET BY MOUTH DAILY 01/23/16   Thayer Headings, MD    Family History Family History  Problem Relation Age of Onset  . Eczema Mother   . Diabetes Father   . Hypertension Father   . Diabetes Maternal Grandmother   . Hypertension Maternal Grandmother   . Diabetes Maternal Grandfather   . Hypertension Maternal Grandfather   . Diabetes Paternal Grandmother   . Hypertension Paternal Grandmother   . Diabetes Paternal Grandfather   .  Hypertension Paternal Grandfather     Social History Social History  Substance Use Topics  . Smoking status: Never Smoker  . Smokeless tobacco: Never Used  . Alcohol use No     Allergies   Patient has no known allergies.   Review of Systems Review of Systems  Constitutional: Positive for fever.  HENT: Positive for congestion and sore throat.   Respiratory: Positive for cough.   Gastrointestinal: Positive for diarrhea and vomiting.  Neurological: Positive for headaches.  All other systems reviewed and are negative.    Physical Exam Updated Vital Signs BP 108/69   Pulse 87   Temp 98.9 F (37.2 C) (Rectal)   Resp 26   Ht 5\' 4"  (1.626 m)   Wt 170 lb (77.1 kg)   LMP 10/13/2016   SpO2 98%   BMI 29.18 kg/m   Physical Exam  Constitutional: She is oriented to person, place, and time.  She appears well-developed and well-nourished.  HENT:  Head: Normocephalic and atraumatic.  Eyes: Conjunctivae and EOM are normal.  Neck: Normal range of motion.  Cardiovascular: Regular rhythm.  Tachycardia present.   Pulmonary/Chest: No stridor. Tachypnea noted. No respiratory distress. She has no wheezes. She has no rales.  Abdominal: Soft. She exhibits no distension. There is no tenderness.  Neurological: She is alert and oriented to person, place, and time. No cranial nerve deficit. Coordination normal.  Skin: Skin is dry.  Nursing note and vitals reviewed.    ED Treatments / Results  Labs (all labs ordered are listed, but only abnormal results are displayed) Labs Reviewed  URINALYSIS, ROUTINE W REFLEX MICROSCOPIC - Abnormal; Notable for the following:       Result Value   Color, Urine AMBER (*)    APPearance TURBID (*)    Hgb urine dipstick SMALL (*)    Bilirubin Urine SMALL (*)    Ketones, ur 15 (*)    Protein, ur >300 (*)    Leukocytes, UA MODERATE (*)    All other components within normal limits  URINALYSIS, MICROSCOPIC (REFLEX) - Abnormal; Notable for the following:    Bacteria, UA MANY (*)    Squamous Epithelial / LPF 0-5 (*)    All other components within normal limits  CBC - Abnormal; Notable for the following:    WBC 15.1 (*)    RBC 5.29 (*)    Hemoglobin 9.7 (*)    HCT 32.9 (*)    MCV 62.2 (*)    MCH 18.3 (*)    MCHC 29.5 (*)    RDW 17.2 (*)    All other components within normal limits  COMPREHENSIVE METABOLIC PANEL - Abnormal; Notable for the following:    Sodium 132 (*)    CO2 18 (*)    Glucose, Bld 134 (*)    BUN 24 (*)    Creatinine, Ser 1.86 (*)    Total Protein 10.4 (*)    GFR calc non Af Amer 33 (*)    GFR calc Af Amer 39 (*)    All other components within normal limits  URINE CULTURE  TROPONIN I    EKG  EKG Interpretation None       Radiology No results found.  Procedures Procedures (including critical care time)  CRITICAL  CARE Performed by: Merrily Pew Total critical care time: 35 minutes Critical care time was exclusive of separately billable procedures and treating other patients. Critical care was necessary to treat or prevent imminent or life-threatening deterioration. Critical care was time  spent personally by me on the following activities: development of treatment plan with patient and/or surrogate as well as nursing, discussions with consultants, evaluation of patient's response to treatment, examination of patient, obtaining history from patient or surrogate, ordering and performing treatments and interventions, ordering and review of laboratory studies, ordering and review of radiographic studies, pulse oximetry and re-evaluation of patient's condition.   Medications Ordered in ED Medications  dextrose 5 % with cefTRIAXone (ROCEPHIN) ADS Med (not administered)  ondansetron (ZOFRAN-ODT) disintegrating tablet 4 mg (4 mg Oral Given 10/27/16 1807)  ibuprofen (ADVIL,MOTRIN) tablet 400 mg (400 mg Oral Given 10/27/16 1807)  ondansetron (ZOFRAN) injection 4 mg (4 mg Intravenous Given 10/27/16 1841)  sodium chloride 0.9 % bolus 1,000 mL (0 mLs Intravenous Stopped 10/27/16 1916)  acetaminophen (TYLENOL) tablet 1,000 mg (1,000 mg Oral Given 10/27/16 1847)  ketorolac (TORADOL) 30 MG/ML injection 15 mg ( Intravenous Given 10/27/16 1915)  cefTRIAXone (ROCEPHIN) 1 g in dextrose 5 % 50 mL IVPB (0 g Intravenous Stopped 10/27/16 1937)  sodium chloride 0.9 % bolus 1,000 mL (0 mLs Intravenous Stopped 10/27/16 2052)  sodium chloride 0.9 % bolus 1,000 mL (0 mLs Intravenous Stopped 10/27/16 2251)     Initial Impression / Assessment and Plan / ED Course  I have reviewed the triage vital signs and the nursing notes.  Pertinent labs & imaging results that were available during my care of the patient were reviewed by me and considered in my medical decision making (see chart for details).     Likely pyelo as underlying cuase  for symptoms but also has diarrhea so possible component of gastroenteritis.   Patient still slightly tachypneic at time of discharge however is afebrile blood pressure stable heart rate improved with all the interventions that she receive on the emergency room. I discussed an observation hospital stay with her and she declined she preferred to go home. As her vital signs are stable she did reasonably well in the emergency department felt this was okay. She will return here for any new or worsening symptoms otherwise will follow-up with her primary doctor in a week to make sure kidney function is better make sure urinary tract infection has improved.  Final Clinical Impressions(s) / ED Diagnoses   Final diagnoses:  Pyelonephritis  Dehydration  Gastroenteritis  AKI (acute kidney injury) ([place])    New Prescriptions Discharge Medication List as of 10/27/2016 10:59 PM    START taking these medications   Details  cephALEXin (KEFLEX) 500 MG capsule Take 1 capsule (500 mg total) by mouth 4 (four) times daily., Starting Mon 10/27/2016, Print    ondansetron (ZOFRAN) 4 MG tablet Take 1 tablet (4 mg total) by mouth every 8 (eight) hours as needed for nausea or vomiting., Starting Mon 10/27/2016, Print         Merrily Pew, MD 10/27/16 2348

## 2016-10-27 NOTE — ED Notes (Signed)
amb to BR w/o assist or problem

## 2016-10-30 LAB — URINE CULTURE: Culture: >=100000 — AB

## 2016-10-31 ENCOUNTER — Telehealth: Payer: Self-pay | Admitting: Emergency Medicine

## 2016-10-31 NOTE — Telephone Encounter (Signed)
Post ED Visit - Positive Culture Follow-up  Culture report reviewed by antimicrobial stewardship pharmacist:  []  Elenor Quinones, Pharm.D. []  Heide Guile, Pharm.D., BCPS []  Parks Neptune, Pharm.D. []  Alycia Rossetti, Pharm.D., BCPS []  [place], Pharm.D., BCPS, AAHIVP []  Legrand Como, Pharm.D., BCPS, AAHIVP []  Milus Glazier, Pharm.D. []  Stephens November, Pharm.D.  Positive urine culture Treated with cephalexin, organism sensitive to the same and no further patient follow-up is required at this time.  Hazle Nordmann 10/31/2016, 3:35 PM

## 2016-11-21 ENCOUNTER — Other Ambulatory Visit: Payer: Self-pay | Admitting: *Deleted

## 2016-11-21 DIAGNOSIS — B20 Human immunodeficiency virus [HIV] disease: Secondary | ICD-10-CM

## 2016-11-21 DIAGNOSIS — Z113 Encounter for screening for infections with a predominantly sexual mode of transmission: Secondary | ICD-10-CM

## 2016-11-25 ENCOUNTER — Emergency Department (HOSPITAL_BASED_OUTPATIENT_CLINIC_OR_DEPARTMENT_OTHER): Payer: BC Managed Care – PPO

## 2016-11-25 ENCOUNTER — Other Ambulatory Visit: Payer: BC Managed Care – PPO

## 2016-11-25 ENCOUNTER — Inpatient Hospital Stay (HOSPITAL_BASED_OUTPATIENT_CLINIC_OR_DEPARTMENT_OTHER)
Admission: EM | Admit: 2016-11-25 | Discharge: 2016-11-28 | DRG: 975 | Disposition: A | Discharge status: Home / Self Care | Payer: BC Managed Care – PPO | Attending: Internal Medicine | Admitting: Internal Medicine

## 2016-11-25 ENCOUNTER — Encounter (HOSPITAL_BASED_OUTPATIENT_CLINIC_OR_DEPARTMENT_OTHER): Payer: Self-pay

## 2016-11-25 DIAGNOSIS — E876 Hypokalemia: Secondary | ICD-10-CM | POA: Diagnosis present

## 2016-11-25 DIAGNOSIS — D473 Essential (hemorrhagic) thrombocythemia: Secondary | ICD-10-CM | POA: Diagnosis not present

## 2016-11-25 DIAGNOSIS — R531 Weakness: Secondary | ICD-10-CM | POA: Diagnosis not present

## 2016-11-25 DIAGNOSIS — D509 Iron deficiency anemia, unspecified: Secondary | ICD-10-CM | POA: Diagnosis present

## 2016-11-25 DIAGNOSIS — A419 Sepsis, unspecified organism: Secondary | ICD-10-CM | POA: Diagnosis not present

## 2016-11-25 DIAGNOSIS — R229 Localized swelling, mass and lump, unspecified: Secondary | ICD-10-CM | POA: Diagnosis not present

## 2016-11-25 DIAGNOSIS — Z113 Encounter for screening for infections with a predominantly sexual mode of transmission: Secondary | ICD-10-CM

## 2016-11-25 DIAGNOSIS — R509 Fever, unspecified: Secondary | ICD-10-CM | POA: Diagnosis present

## 2016-11-25 DIAGNOSIS — N3000 Acute cystitis without hematuria: Secondary | ICD-10-CM

## 2016-11-25 DIAGNOSIS — Z9102 Food additives allergy status: Secondary | ICD-10-CM | POA: Diagnosis not present

## 2016-11-25 DIAGNOSIS — Z8249 Family history of ischemic heart disease and other diseases of the circulatory system: Secondary | ICD-10-CM

## 2016-11-25 DIAGNOSIS — Z91018 Allergy to other foods: Secondary | ICD-10-CM | POA: Diagnosis not present

## 2016-11-25 DIAGNOSIS — K130 Diseases of lips: Secondary | ICD-10-CM | POA: Diagnosis not present

## 2016-11-25 DIAGNOSIS — Z79899 Other long term (current) drug therapy: Secondary | ICD-10-CM

## 2016-11-25 DIAGNOSIS — Z9114 Patient's other noncompliance with medication regimen: Secondary | ICD-10-CM

## 2016-11-25 DIAGNOSIS — R197 Diarrhea, unspecified: Secondary | ICD-10-CM | POA: Diagnosis not present

## 2016-11-25 DIAGNOSIS — N390 Urinary tract infection, site not specified: Secondary | ICD-10-CM

## 2016-11-25 DIAGNOSIS — B20 Human immunodeficiency virus [HIV] disease: Secondary | ICD-10-CM | POA: Diagnosis present

## 2016-11-25 DIAGNOSIS — D75839 Thrombocytosis, unspecified: Secondary | ICD-10-CM | POA: Diagnosis present

## 2016-11-25 DIAGNOSIS — T7840XA Allergy, unspecified, initial encounter: Secondary | ICD-10-CM | POA: Diagnosis not present

## 2016-11-25 DIAGNOSIS — Z21 Asymptomatic human immunodeficiency virus [HIV] infection status: Secondary | ICD-10-CM | POA: Diagnosis not present

## 2016-11-25 DIAGNOSIS — Z9889 Other specified postprocedural states: Secondary | ICD-10-CM | POA: Diagnosis not present

## 2016-11-25 DIAGNOSIS — R7989 Other specified abnormal findings of blood chemistry: Secondary | ICD-10-CM | POA: Diagnosis present

## 2016-11-25 DIAGNOSIS — Z833 Family history of diabetes mellitus: Secondary | ICD-10-CM

## 2016-11-25 DIAGNOSIS — Z6824 Body mass index (BMI) 24.0-24.9, adult: Secondary | ICD-10-CM | POA: Diagnosis not present

## 2016-11-25 DIAGNOSIS — R5383 Other fatigue: Secondary | ICD-10-CM | POA: Diagnosis not present

## 2016-11-25 DIAGNOSIS — R5381 Other malaise: Secondary | ICD-10-CM | POA: Diagnosis not present

## 2016-11-25 DIAGNOSIS — Z840 Family history of diseases of the skin and subcutaneous tissue: Secondary | ICD-10-CM | POA: Diagnosis not present

## 2016-11-25 DIAGNOSIS — Z87440 Personal history of urinary (tract) infections: Secondary | ICD-10-CM | POA: Diagnosis not present

## 2016-11-25 DIAGNOSIS — R8271 Bacteriuria: Secondary | ICD-10-CM | POA: Diagnosis not present

## 2016-11-25 LAB — COMPLETE METABOLIC PANEL WITH GFR
ALBUMIN: 3.3 g/dL — AB (ref 3.6–5.1)
ALT: 7 U/L (ref 6–29)
AST: 19 U/L (ref 10–30)
Alkaline Phosphatase: 84 U/L (ref 33–115)
BUN: 8 mg/dL (ref 7–25)
CALCIUM: 8.9 mg/dL (ref 8.6–10.2)
CHLORIDE: 98 mmol/L (ref 98–110)
CO2: 20 mmol/L (ref 20–31)
Creat: 1.04 mg/dL (ref 0.50–1.10)
GFR, EST NON AFRICAN AMERICAN: 68 mL/min (ref >=60)
GFR, Est African American: 79 mL/min (ref >=60)
Glucose, Bld: 84 mg/dL (ref 65–99)
POTASSIUM: 3.8 mmol/L (ref 3.5–5.3)
SODIUM: 129 mmol/L — AB (ref 135–146)
Total Bilirubin: 0.4 mg/dL (ref 0.2–1.2)
Total Protein: 8.8 g/dL — ABNORMAL HIGH (ref 6.1–8.1)

## 2016-11-25 LAB — CBC WITH DIFFERENTIAL/PLATELET
BASOS ABS: 0 {cells}/uL (ref 0–200)
Basophils Absolute: 0 10*3/uL (ref 0.0–0.1)
Basophils Relative: 0 %
Basophils Relative: 0 %
EOS ABS: 380 {cells}/uL (ref 15–500)
EOS ABS: 0.2 10*3/uL (ref 0.0–0.7)
EOS PCT: 4 %
Eosinophils Relative: 2 %
HCT: 24.4 % — ABNORMAL LOW (ref 35.0–45.0)
HCT: 23.7 % — ABNORMAL LOW (ref 36.0–46.0)
HEMOGLOBIN: 6.9 g/dL — AB (ref 12.0–15.0)
Hemoglobin: 7.1 g/dL — ABNORMAL LOW (ref 11.7–15.5)
LYMPHS PCT: 14 %
LYMPHS PCT: 18 %
Lymphs Abs: 1330 cells/uL (ref 850–3900)
Lymphs Abs: 1.9 10*3/uL (ref 0.7–4.0)
MCH: 18.7 pg — AB (ref 27.0–33.0)
MCH: 19.1 pg — ABNORMAL LOW (ref 26.0–34.0)
MCHC: 29.1 g/dL — AB (ref 32.0–36.0)
MCHC: 29.1 g/dL — ABNORMAL LOW (ref 30.0–36.0)
MCV: 64.4 fL — AB (ref 80.0–100.0)
MCV: 65.7 fL — ABNORMAL LOW (ref 78.0–100.0)
MONOS PCT: 8 %
MONOS PCT: 10 %
MPV: 9.4 fL (ref 7.5–12.5)
Monocytes Absolute: 760 cells/uL (ref 200–950)
Monocytes Absolute: 1.1 10*3/uL — ABNORMAL HIGH (ref 0.1–1.0)
NEUTROS ABS: 7.6 10*3/uL (ref 1.7–7.7)
NEUTROS PCT: 74 %
NEUTROS PCT: 70 %
Neutro Abs: 7030 cells/uL (ref 1500–7800)
PLATELETS: 583 10*3/uL — AB (ref 150–400)
Platelets: 588 10*3/uL — ABNORMAL HIGH (ref 140–400)
RBC: 3.79 MIL/uL — ABNORMAL LOW (ref 3.80–5.10)
RBC: 3.61 MIL/uL — AB (ref 3.87–5.11)
RDW: 17.6 % — AB (ref 11.0–15.0)
RDW: 17.3 % — ABNORMAL HIGH (ref 11.5–15.5)
WBC: 9.5 10*3/uL (ref 3.8–10.8)
WBC: 10.8 10*3/uL — ABNORMAL HIGH (ref 4.0–10.5)

## 2016-11-25 LAB — BASIC METABOLIC PANEL
ANION GAP: 10 (ref 5–15)
BUN: 8 mg/dL (ref 6–20)
CHLORIDE: 98 mmol/L — AB (ref 101–111)
CO2: 21 mmol/L — ABNORMAL LOW (ref 22–32)
Calcium: 8.6 mg/dL — ABNORMAL LOW (ref 8.9–10.3)
Creatinine, Ser: 0.96 mg/dL (ref 0.44–1.00)
Glucose, Bld: 96 mg/dL (ref 65–99)
POTASSIUM: 3.2 mmol/L — AB (ref 3.5–5.1)
SODIUM: 129 mmol/L — AB (ref 135–145)

## 2016-11-25 LAB — URINALYSIS, ROUTINE W REFLEX MICROSCOPIC
Bilirubin Urine: NEGATIVE
Glucose, UA: NEGATIVE mg/dL
Hgb urine dipstick: NEGATIVE
Ketones, ur: NEGATIVE mg/dL
NITRITE: POSITIVE — AB
PROTEIN: 30 mg/dL — AB
Specific Gravity, Urine: 1.008 (ref 1.005–1.030)
pH: 6 (ref 5.0–8.0)

## 2016-11-25 LAB — URINALYSIS, MICROSCOPIC (REFLEX)

## 2016-11-25 LAB — I-STAT CG4 LACTIC ACID, ED
LACTIC ACID, VENOUS: 1.91 mmol/L — AB (ref 0.5–1.9)
Lactic Acid, Venous: 0.44 mmol/L — ABNORMAL LOW (ref 0.5–1.9)

## 2016-11-25 MED ORDER — PIPERACILLIN-TAZOBACTAM 3.375 G IVPB 30 MIN
3.3750 g | Freq: Once | INTRAVENOUS | Status: AC
  Administered 2016-11-25: 3.375 g via INTRAVENOUS
  Filled 2016-11-25 (×2): qty 50

## 2016-11-25 MED ORDER — ACETAMINOPHEN 325 MG PO TABS
650.0000 mg | ORAL_TABLET | Freq: Once | ORAL | Status: AC
Start: 2016-11-25 — End: 2016-11-25
  Administered 2016-11-25: 650 mg via ORAL
  Filled 2016-11-25: qty 2

## 2016-11-25 MED ORDER — VANCOMYCIN HCL IN DEXTROSE 1-5 GM/200ML-% IV SOLN
1000.0000 mg | Freq: Once | INTRAVENOUS | Status: AC
  Administered 2016-11-25: 1000 mg via INTRAVENOUS
  Filled 2016-11-25: qty 200

## 2016-11-25 MED ORDER — VANCOMYCIN HCL IN DEXTROSE 750-5 MG/150ML-% IV SOLN
750.0000 mg | Freq: Two times a day (BID) | INTRAVENOUS | Status: DC
  Administered 2016-11-26 – 2016-11-27 (×3): 750 mg via INTRAVENOUS
  Filled 2016-11-25 (×7): qty 150

## 2016-11-25 MED ORDER — IBUPROFEN 800 MG PO TABS
800.0000 mg | ORAL_TABLET | Freq: Once | ORAL | Status: AC
  Administered 2016-11-25: 800 mg via ORAL
  Filled 2016-11-25: qty 1

## 2016-11-25 MED ORDER — PIPERACILLIN-TAZOBACTAM 3.375 G IVPB
3.3750 g | Freq: Three times a day (TID) | INTRAVENOUS | Status: DC
  Administered 2016-11-26 (×2): 3.375 g via INTRAVENOUS
  Filled 2016-11-25 (×3): qty 50

## 2016-11-25 MED ORDER — SODIUM CHLORIDE 0.9 % IV BOLUS (SEPSIS)
1000.0000 mL | Freq: Once | INTRAVENOUS | Status: AC
  Administered 2016-11-25: 1000 mL via INTRAVENOUS

## 2016-11-25 NOTE — ED Notes (Signed)
Pt is goin g to room [phone]

## 2016-11-25 NOTE — ED Notes (Signed)
Report to [facility] RN with [employer] 

## 2016-11-25 NOTE — ED Notes (Signed)
Report to [place] RN

## 2016-11-25 NOTE — Progress Notes (Signed)
Pharmacy Antibiotic Note  CAITLYNN JU is a 41 y.o. female admitted on 11/25/2016 with sepsis.  Pharmacy has been consulted for vancomycin and zosyn dosing. Tmax is 103 and WBC is mildly elevated at 10.8. Scr is WNL and lactic acid is 1.91. Pt with history of HIV.   Plan: Vancomycin 1gm IV x 1 then 750mg  IV Q12H Zosyn 3.375gm  IV Q8H (4 hr inf) F/u renal fxn, C&S, clinical status and trough at SS  Height: 5\' 5"  (165.1 cm) Weight: 150 lb (68 kg) IBW/kg (Calculated) : 57  Temp (24hrs), Avg:102.7 F (39.3 C), Min:102.4 F (39.1 C), Max:103 F (39.4 C)   Recent Labs Lab 11/25/16 1006 11/25/16 1852 11/25/16 1923  WBC 9.5 10.8*  --   CREATININE  --  0.96  --   LATICACIDVEN  --   --  1.91*    Estimated Creatinine Clearance: 71.5 mL/min (by C-G formula based on SCr of 0.96 mg/dL).    No Known Allergies  Antimicrobials this admission: Vanc 3/27>> Zosyn 3/27>>  Dose adjustments this admission: N/A  Microbiology results: Pending  Thank you for allowing pharmacy to be a part of this patient's care.  Masaki Rothbauer, Rande Lawman 11/25/2016 8:03 PM

## 2016-11-25 NOTE — ED Notes (Signed)
Attempted to give report. Nurse will call me back.  

## 2016-11-25 NOTE — ED Provider Notes (Addendum)
[facility] Provider Note   CSN: [id number] Arrival date & time: 11/25/16  1815  History   Chief Complaint Chief Complaint  Patient presents with  . Fever    HPI Briana [name] is a 41 y.o. female with a h/o of HIV with no HAART therapy for the last 12 months who presents to the Emergency Department with constant, worsening fatigue and anorexia over the last 3 weeks. Denies HA, paresthesias, fever, neck stiffness or pain, congestion, otalgia, sore throat, dyspnea, CP, abdominal pain, N/V/D, back pain, dysuria, and vaginal pain, itching, or discharge. On 2/26, she was seen in the ED with cough, diarrhea, fever, and HA and diagnosed withfor likely pyelonephritis and gastroenteritis. She was discharged with a course of Keflex for 7 days. She reports the symptoms improved .   She is followed in the ID clinic by Dr. Linus Salmons. She was seen earlier today for lab work only. She reports that she has not been able to afford her HAART therapy for the last year so she has not taken it. Last known CD4 count 340 in 10/16.    HPI  Past Medical History:  Diagnosis Date  . Abnormal Pap smear    s/p colposcopy   . Anemia   . Chronic kidney disease 03/2013   kidney infection  . HIV infection ([place])   . Screening for malignant neoplasm of the cervix     Patient Active Problem List   Diagnosis Date Noted  . Sepsis secondary to UTI ([place]) 11/26/2016  . Thrombocytosis ([place]) 11/26/2016  . Hypokalemia 11/26/2016  . Fever 11/25/2016  . Eczema 09/11/2011  . 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 Iron deficiency anemia 01/23/2011  . Human immunodeficiency virus (HIV) disease ([place]) 10/19/2006    Past Surgical History:  Procedure Laterality Date  . CESAREAN SECTION     2000/2008  . COSMETIC SURGERY     2 previous c-sections  . TUBAL LIGATION      OB History    Gravida Para Term Preterm AB Living   3 3 3     4    SAB TAB Ectopic Multiple Live Births         1 3      Obstetric  Comments   dsf       Home Medications    Prior to Admission medications   Medication Sig Start Date End Date Taking? Authorizing Provider  famotidine (PEPCID) 20 MG tablet Take 1 tablet (20 mg total) by mouth 2 (two) times daily. 11/28/16   Belkys A Regalado, MD  feeding supplement (BOOST / RESOURCE BREEZE) LIQD Take 1 Container by mouth 3 (three) times daily between meals. 11/28/16   Belkys A Regalado, MD  ferrous sulfate 325 (65 FE) MG tablet Take 1 tablet (325 mg total) by mouth 2 (two) times daily with a meal. 11/28/16   Belkys A Regalado, MD  levofloxacin (LEVAQUIN) 500 MG tablet Take 1 tablet (500 mg total) by mouth daily. 11/28/16   Belkys A Regalado, MD  loratadine (CLARITIN) 10 MG tablet Take 1 tablet (10 mg total) by mouth daily. 11/28/16   Belkys A Regalado, MD  sodium bicarbonate 650 MG tablet Take 1 tablet (650 mg total) by mouth 2 (two) times daily. 11/28/16   Belkys A Regalado, MD  vitamin B-12 100 MCG tablet Take 1 tablet (100 mcg total) by mouth daily. 11/28/16   Elmarie Shiley, MD    Family History Family History  Problem Relation Age  of Onset  . Eczema Mother   . Diabetes Father   . Hypertension Father   . Diabetes Maternal Grandmother   . Hypertension Maternal Grandmother   . Diabetes Maternal Grandfather   . Hypertension Maternal Grandfather   . Diabetes Paternal Grandmother   . Hypertension Paternal Grandmother   . Diabetes Paternal Grandfather   . Hypertension Paternal Grandfather     Social History Social History  Substance Use Topics  . Smoking status: Never Smoker  . Smokeless tobacco: Never Used  . Alcohol use No   Allergies   Pumpkin flavor   Review of Systems Review of Systems  Constitutional: Positive for activity change and appetite change. Negative for chills, fatigue and fever.  HENT: Negative for congestion, ear pain, sinus pain and sore throat.   Respiratory: Negative for shortness of breath.   Cardiovascular: Negative for chest pain.    Gastrointestinal: Negative for abdominal pain, diarrhea, nausea and vomiting.  Genitourinary: Negative for dysuria, frequency and hematuria.  Musculoskeletal: Negative for back pain, neck pain and neck stiffness.  Allergic/Immunologic: Positive for immunocompromised state.  Neurological: Negative for weakness, numbness and headaches.  Psychiatric/Behavioral: Negative for confusion.   Physical Exam Updated Vital Signs BP (!) 160/79 (BP Location: Left Arm)   Pulse 61   Temp 97.7 F (36.5 C) (Oral)   Resp 18   Ht 5\' 5"  (1.651 m)   Wt 68 kg   LMP 11/17/2016   SpO2 100%   BMI 24.96 kg/m   Physical Exam  Constitutional: She is oriented to person, place, and time. She appears well-developed and well-nourished. She appears ill.  Skins feel warm to the touch.   HENT:  Head: Normocephalic and atraumatic.  Right Ear: Tympanic membrane and external ear normal.  Left Ear: Tympanic membrane and external ear normal.  Nose: Mucosal edema and rhinorrhea present.  Mouth/Throat: Uvula is midline.  Minimal posterior oropharyngeal erythema.   Eyes: Conjunctivae are normal.  Neck: Normal range of motion.  No meningeal signs.   Cardiovascular: Normal rate, regular rhythm, normal heart sounds and intact distal pulses.  Exam reveals no gallop and no friction rub.   No murmur heard. Pulmonary/Chest: Breath sounds normal. Tachypnea noted. No respiratory distress. She has no wheezes. She has no rales. She exhibits no tenderness.  Abdominal: Soft. Bowel sounds are normal. She exhibits no distension. There is no tenderness. There is no guarding.  Musculoskeletal: Normal range of motion. She exhibits no edema, tenderness or deformity.  Neurological: She is alert and oriented to person, place, and time. No cranial nerve deficit. She exhibits normal muscle tone.  Skin: Skin is dry. Capillary refill takes less than 2 seconds. No rash noted. She is not diaphoretic.  Psychiatric: She has a normal mood and  affect. Her behavior is normal. Judgment and thought content normal.  Nursing note and vitals reviewed.  ED Treatments / Results  Labs (all labs ordered are listed, but only abnormal results are displayed) Labs Reviewed  URINE CULTURE - Abnormal; Notable for the following:       Result Value   Culture <10,000 COLONIES/mL INSIGNIFICANT GROWTH (*)    All other components within normal limits  CBC WITH DIFFERENTIAL/PLATELET - Abnormal; Notable for the following:    WBC 10.8 (*)    RBC 3.61 (*)    Hemoglobin 6.9 (*)    HCT 23.7 (*)    MCV 65.7 (*)    MCH 19.1 (*)    MCHC 29.1 (*)    RDW 17.3 (*)  Platelets 583 (*)    Monocytes Absolute 1.1 (*)    All other components within normal limits  BASIC METABOLIC PANEL - Abnormal; Notable for the following:    Sodium 129 (*)    Potassium 3.2 (*)    Chloride 98 (*)    CO2 21 (*)    Calcium 8.6 (*)    All other components within normal limits  URINALYSIS, ROUTINE W REFLEX MICROSCOPIC - Abnormal; Notable for the following:    APPearance CLOUDY (*)    Protein, ur 30 (*)    Nitrite POSITIVE (*)    Leukocytes, UA LARGE (*)    All other components within normal limits  URINALYSIS, MICROSCOPIC (REFLEX) - Abnormal; Notable for the following:    Bacteria, UA MANY (*)    Squamous Epithelial / LPF 6-30 (*)    All other components within normal limits  CBC - Abnormal; Notable for the following:    RBC 3.45 (*)    Hemoglobin 6.4 (*)    HCT 22.7 (*)    MCV 65.8 (*)    MCH 18.6 (*)    MCHC 28.2 (*)    RDW 16.8 (*)    Platelets 428 (*)    All other components within normal limits  COMPREHENSIVE METABOLIC PANEL - Abnormal; Notable for the following:    Potassium 2.8 (*)    CO2 20 (*)    Glucose, Bld 105 (*)    Creatinine, Ser 1.13 (*)    Calcium 8.2 (*)    Albumin 2.3 (*)    ALT 9 (*)    All other components within normal limits  IRON AND TIBC - Abnormal; Notable for the following:    Iron 6 (*)    TIBC 220 (*)    Saturation Ratios 3  (*)    All other components within normal limits  RETICULOCYTES - Abnormal; Notable for the following:    RBC. 3.45 (*)    All other components within normal limits  MAGNESIUM - Abnormal; Notable for the following:    Magnesium 3.2 (*)    All other components within normal limits  CBC - Abnormal; Notable for the following:    Hemoglobin 8.5 (*)    HCT 28.9 (*)    MCV 69.8 (*)    MCH 20.5 (*)    MCHC 29.4 (*)    RDW 19.1 (*)    Platelets 417 (*)    All other components within normal limits  BASIC METABOLIC PANEL - Abnormal; Notable for the following:    Chloride 114 (*)    CO2 19 (*)    BUN <5 (*)    Calcium 8.7 (*)    All other components within normal limits  BASIC METABOLIC PANEL - Abnormal; Notable for the following:    CO2 18 (*)    Glucose, Bld 108 (*)    BUN 5 (*)    All other components within normal limits  I-STAT CG4 LACTIC ACID, ED - Abnormal; Notable for the following:    Lactic Acid, Venous 1.91 (*)    All other components within normal limits  I-STAT CG4 LACTIC ACID, ED - Abnormal; Notable for the following:    Lactic Acid, Venous 0.44 (*)    All other components within normal limits  CULTURE, BLOOD (ROUTINE X 2)  CULTURE, BLOOD (ROUTINE X 2)  PROCALCITONIN  APTT  PROTIME-INR  VITAMIN B12  FOLATE  FERRITIN  PREGNANCY, URINE  INFLUENZA PANEL BY PCR (TYPE A & B)  OCCULT  BLOOD X 1 CARD TO LAB, STOOL  TYPE AND SCREEN  PREPARE RBC (CROSSMATCH)  ABO/RH  PREPARE RBC (CROSSMATCH)    EKG  EKG Interpretation  Date/Time:  Tuesday November 25 2016 19:13:55 EDT Ventricular Rate:  111 PR Interval:    QRS Duration: 82 QT Interval:  325 QTC Calculation: 442 R Axis:   41 Text Interpretation:  Sinus tachycardia Consider left ventricular hypertrophy Baseline wander in lead(s) V4 V5 Confirmed by Lita Mains  MD, DAVID ([id number]) on 11/27/2016 2:48:25 PM      Radiology No results found.  Procedures Procedures (including critical care time)  Medications Ordered in  ED Medications  ibuprofen (ADVIL,MOTRIN) tablet 800 mg (800 mg Oral Given 11/25/16 1837)  piperacillin-tazobactam (ZOSYN) IVPB 3.375 g (0 g Intravenous Stopped 11/25/16 2010)  vancomycin (VANCOCIN) IVPB 1000 mg/200 mL premix (0 mg Intravenous Stopped 11/25/16 2100)  acetaminophen (TYLENOL) tablet 650 mg (650 mg Oral Given 11/25/16 1928)  sodium chloride 0.9 % bolus 1,000 mL (0 mLs Intravenous Stopped 11/25/16 2040)  potassium chloride SA (K-DUR,KLOR-CON) CR tablet 40 mEq (40 mEq Oral Given 11/26/16 0304)  0.9 %  sodium chloride infusion ( Intravenous New Bag/Given 11/26/16 0305)  magnesium sulfate IVPB 2 g 50 mL (2 g Intravenous Given 11/26/16 0542)  potassium chloride 30 mEq in sodium chloride 0.9 % 265 mL (KCL MULTIRUN) IVPB (30 mEq Intravenous Given 11/26/16 0547)  0.9 %  sodium chloride infusion ( Intravenous New Bag/Given 11/26/16 0915)    Initial Impression / Assessment and Plan / ED Course  I have reviewed the triage vital signs and the nursing notes.  Pertinent labs & imaging results that were available during my care of the patient were reviewed by me and considered in my medical decision making (see chart for details).     CRITICAL CARE Performed by: Joanne Gavel Total critical care time: 30 minutes Critical care time was exclusive of separately billable procedures and treating other patients. Critical care was necessary to treat or prevent imminent or life-threatening deterioration. Critical care was time spent personally by me on the following activities: development of treatment plan with patient and/or surrogate as well as nursing, discussions with consultants, evaluation of patient's response to treatment, examination of patient, obtaining history from patient or surrogate, ordering and performing treatments and interventions, ordering and review of laboratory studies, ordering and review of radiographic studies, pulse oximetry and re-evaluation of patient's condition.   41 y.o.  female with HIV who has been non-compliant with HAART therapy for the last year d/t not being able to afford her medication. Last known CD4 count 340 in 10/16. Patient is febrile to 103 in the ED, improving with Tylenol. VSS improving. No meningeal signs. CT head negative. Lungs are CTAB bilateral. CXR negative. Low suspicion for meningitis or PCP. UA with nitrites and leukocyte esterase. Urosepsis most likely source of infection at this time. BCx x2 drawn in the ED. Vancomycin and Zosyn given with a fluid bolus. Dr. Shanon Brow with the hospitalist team consulted. Will admit the patient for continued workup.      Final Clinical Impressions(s) / ED Diagnoses   Final diagnoses:  UTI (urinary tract infection)   New Prescriptions Discharge Medication List as of 11/28/2016 11:52 AM    START taking these medications   Details  famotidine (PEPCID) 20 MG tablet Take 1 tablet (20 mg total) by mouth 2 (two) times daily., Starting Fri 11/28/2016, Print    feeding supplement (BOOST / RESOURCE BREEZE) LIQD Take 1 Container by mouth  3 (three) times daily between meals., Starting Fri 11/28/2016, Print    ferrous sulfate 325 (65 FE) MG tablet Take 1 tablet (325 mg total) by mouth 2 (two) times daily with a meal., Starting Fri 11/28/2016, Print    levofloxacin (LEVAQUIN) 500 MG tablet Take 1 tablet (500 mg total) by mouth daily., Starting Fri 11/28/2016, Print    loratadine (CLARITIN) 10 MG tablet Take 1 tablet (10 mg total) by mouth daily., Starting Fri 11/28/2016, Print    sodium bicarbonate 650 MG tablet Take 1 tablet (650 mg total) by mouth 2 (two) times daily., Starting Fri 11/28/2016, Print    vitamin B-12 100 MCG tablet Take 1 tablet (100 mcg total) by mouth daily., Starting Fri 11/28/2016, Print         Lolitha Tortora A Melody Savidge, PA-C 11/26/16 0008    Courteney Julio Alm, MD 11/26/16 [place], PA-C 12/02/16 2148    Courteney Lyn Mackuen, MD 12/04/16 0034

## 2016-11-25 NOTE — ED Triage Notes (Signed)
Pt c/o loss of appetite, fatigue and weakness since her previous visit.  Today she has a fever, was not aware of fever, pt has not taken any meds for fever today.  Denies n/v/d, denies sore throat, denies chest pain, denies cough, denies dysuria.

## 2016-11-26 ENCOUNTER — Encounter (HOSPITAL_COMMUNITY): Payer: Self-pay

## 2016-11-26 DIAGNOSIS — R8271 Bacteriuria: Secondary | ICD-10-CM

## 2016-11-26 DIAGNOSIS — Z840 Family history of diseases of the skin and subcutaneous tissue: Secondary | ICD-10-CM

## 2016-11-26 DIAGNOSIS — R197 Diarrhea, unspecified: Secondary | ICD-10-CM

## 2016-11-26 DIAGNOSIS — B20 Human immunodeficiency virus [HIV] disease: Secondary | ICD-10-CM

## 2016-11-26 DIAGNOSIS — Z8249 Family history of ischemic heart disease and other diseases of the circulatory system: Secondary | ICD-10-CM

## 2016-11-26 DIAGNOSIS — D75839 Thrombocytosis, unspecified: Secondary | ICD-10-CM | POA: Diagnosis present

## 2016-11-26 DIAGNOSIS — R5381 Other malaise: Secondary | ICD-10-CM

## 2016-11-26 DIAGNOSIS — D473 Essential (hemorrhagic) thrombocythemia: Secondary | ICD-10-CM | POA: Diagnosis present

## 2016-11-26 DIAGNOSIS — Z21 Asymptomatic human immunodeficiency virus [HIV] infection status: Secondary | ICD-10-CM

## 2016-11-26 DIAGNOSIS — A419 Sepsis, unspecified organism: Secondary | ICD-10-CM | POA: Insufficient documentation

## 2016-11-26 DIAGNOSIS — R531 Weakness: Secondary | ICD-10-CM

## 2016-11-26 DIAGNOSIS — N390 Urinary tract infection, site not specified: Secondary | ICD-10-CM

## 2016-11-26 DIAGNOSIS — R5383 Other fatigue: Secondary | ICD-10-CM

## 2016-11-26 DIAGNOSIS — E876 Hypokalemia: Secondary | ICD-10-CM

## 2016-11-26 DIAGNOSIS — Z87440 Personal history of urinary (tract) infections: Secondary | ICD-10-CM

## 2016-11-26 DIAGNOSIS — Z833 Family history of diabetes mellitus: Secondary | ICD-10-CM

## 2016-11-26 LAB — PROCALCITONIN: PROCALCITONIN: 0.49 ng/mL

## 2016-11-26 LAB — RETICULOCYTES
RBC.: 3.45 MIL/uL — ABNORMAL LOW (ref 3.87–5.11)
Retic Count, Absolute: 34.5 10*3/uL (ref 19.0–186.0)
Retic Ct Pct: 1 % (ref 0.4–3.1)

## 2016-11-26 LAB — COMPREHENSIVE METABOLIC PANEL
ALK PHOS: 71 U/L (ref 38–126)
ALT: 9 U/L — AB (ref 14–54)
AST: 20 U/L (ref 15–41)
Albumin: 2.3 g/dL — ABNORMAL LOW (ref 3.5–5.0)
Anion gap: 12 (ref 5–15)
BUN: 8 mg/dL (ref 6–20)
CO2: 20 mmol/L — AB (ref 22–32)
CREATININE: 1.13 mg/dL — AB (ref 0.44–1.00)
Calcium: 8.2 mg/dL — ABNORMAL LOW (ref 8.9–10.3)
Chloride: 104 mmol/L (ref 101–111)
GFR calc Af Amer: >60 mL/min (ref >60)
GFR calc non Af Amer: >60 mL/min (ref >60)
Glucose, Bld: 105 mg/dL — ABNORMAL HIGH (ref 65–99)
Potassium: 2.8 mmol/L — ABNORMAL LOW (ref 3.5–5.1)
SODIUM: 136 mmol/L (ref 135–145)
Total Bilirubin: 0.7 mg/dL (ref 0.3–1.2)
Total Protein: 7.6 g/dL (ref 6.5–8.1)

## 2016-11-26 LAB — CBC
HCT: 22.7 % — ABNORMAL LOW (ref 36.0–46.0)
HEMOGLOBIN: 6.4 g/dL — AB (ref 12.0–15.0)
MCH: 18.6 pg — ABNORMAL LOW (ref 26.0–34.0)
MCHC: 28.2 g/dL — ABNORMAL LOW (ref 30.0–36.0)
MCV: 65.8 fL — ABNORMAL LOW (ref 78.0–100.0)
Platelets: 428 10*3/uL — ABNORMAL HIGH (ref 150–400)
RBC: 3.45 MIL/uL — AB (ref 3.87–5.11)
RDW: 16.8 % — ABNORMAL HIGH (ref 11.5–15.5)
WBC: 5.6 10*3/uL (ref 4.0–10.5)

## 2016-11-26 LAB — PREPARE RBC (CROSSMATCH)

## 2016-11-26 LAB — ABO/RH: ABO/RH(D): O POS

## 2016-11-26 LAB — FERRITIN: FERRITIN: 158 ng/mL (ref 11–307)

## 2016-11-26 LAB — IRON AND TIBC
IRON: 6 ug/dL — AB (ref 28–170)
SATURATION RATIOS: 3 % — AB (ref 10.4–31.8)
TIBC: 220 ug/dL — ABNORMAL LOW (ref 250–450)
UIBC: 214 ug/dL

## 2016-11-26 LAB — PROTIME-INR
INR: 1.18
PROTHROMBIN TIME: 15.1 s (ref 11.4–15.2)

## 2016-11-26 LAB — INFLUENZA PANEL BY PCR (TYPE A & B)
INFLBPCR: NEGATIVE
Influenza A By PCR: NEGATIVE

## 2016-11-26 LAB — PREGNANCY, URINE: Preg Test, Ur: NEGATIVE

## 2016-11-26 LAB — FOLATE: FOLATE: 16.9 ng/mL (ref >5.9)

## 2016-11-26 LAB — T-HELPER CELL (CD4) - (RCID CLINIC ONLY)
CD4 T CELL ABS: 150 /uL — AB (ref 400–2700)
CD4 T CELL HELPER: 10 % — AB (ref 33–55)

## 2016-11-26 LAB — MAGNESIUM: Magnesium: 3.2 mg/dL — ABNORMAL HIGH (ref 1.7–2.4)

## 2016-11-26 LAB — RPR

## 2016-11-26 LAB — VITAMIN B12: Vitamin B-12: 299 pg/mL (ref 180–914)

## 2016-11-26 LAB — APTT: aPTT: 34 seconds (ref 24–36)

## 2016-11-26 MED ORDER — MAGNESIUM SULFATE 2 GM/50ML IV SOLN
2.0000 g | Freq: Once | INTRAVENOUS | Status: AC
  Administered 2016-11-26: 2 g via INTRAVENOUS
  Filled 2016-11-26: qty 50

## 2016-11-26 MED ORDER — ONDANSETRON HCL 4 MG PO TABS: 4.0000 mg | ORAL_TABLET | Freq: Four times a day (QID) | ORAL | Status: DC | PRN

## 2016-11-26 MED ORDER — ACETAMINOPHEN 325 MG PO TABS
650.0000 mg | ORAL_TABLET | Freq: Four times a day (QID) | ORAL | Status: DC | PRN
  Administered 2016-11-26: 650 mg via ORAL

## 2016-11-26 MED ORDER — SODIUM CHLORIDE 0.9 % IV SOLN
Freq: Once | INTRAVENOUS | Status: AC
  Administered 2016-11-26: 03:00:00 via INTRAVENOUS

## 2016-11-26 MED ORDER — POTASSIUM CHLORIDE CRYS ER 20 MEQ PO TBCR
40.0000 meq | EXTENDED_RELEASE_TABLET | ORAL | Status: AC
  Administered 2016-11-26: 40 meq via ORAL
  Filled 2016-11-26: qty 2

## 2016-11-26 MED ORDER — SODIUM CHLORIDE 0.9 % IV SOLN: Freq: Once | INTRAVENOUS | Status: DC

## 2016-11-26 MED ORDER — ENSURE ENLIVE PO LIQD
237.0000 mL | Freq: Two times a day (BID) | ORAL | Status: DC
  Administered 2016-11-26 – 2016-11-27 (×2): 237 mL via ORAL

## 2016-11-26 MED ORDER — ONDANSETRON HCL 4 MG/2ML IJ SOLN: 4.0000 mg | Freq: Four times a day (QID) | INTRAMUSCULAR | Status: DC | PRN

## 2016-11-26 MED ORDER — SODIUM CHLORIDE 0.9 % IV SOLN
Freq: Once | INTRAVENOUS | Status: AC
  Administered 2016-11-26: 09:00:00 via INTRAVENOUS

## 2016-11-26 MED ORDER — SODIUM CHLORIDE 0.9 % IV SOLN
30.0000 meq | Freq: Once | INTRAVENOUS | Status: AC
  Administered 2016-11-26: 30 meq via INTRAVENOUS
  Filled 2016-11-26: qty 15

## 2016-11-26 MED ORDER — DEXTROSE 5 % IV SOLN
1.0000 g | INTRAVENOUS | Status: DC
  Administered 2016-11-26: 1 g via INTRAVENOUS
  Filled 2016-11-26 (×2): qty 10

## 2016-11-26 MED ORDER — BLISTEX MEDICATED EX OINT
TOPICAL_OINTMENT | CUTANEOUS | Status: DC | PRN
  Filled 2016-11-26: qty 6.3

## 2016-11-26 MED ORDER — SODIUM CHLORIDE 0.9 % IV SOLN
INTRAVENOUS | Status: DC
  Administered 2016-11-26 – 2016-11-28 (×3): via INTRAVENOUS

## 2016-11-26 MED ORDER — ACETAMINOPHEN 650 MG RE SUPP: 650.0000 mg | Freq: Four times a day (QID) | RECTAL | Status: DC | PRN

## 2016-11-26 NOTE — H&P (Signed)
History and Physical    Briana [name]:[id number] DOB: 12-13-1975 DOA: 11/25/2016  Referring MD/NP/PA: Joline Maxcy PA-C PCP: Scharlene Gloss, MD  Patient coming from: [facility]  Chief Complaint: No energy  HPI: Briana [name] is a 41 y.o. female with medical history significant of  HIV not on HARRT last 12 months; who present with complaints of fatigue and decreased overall appetite over the last 3 weeks. Associated symptoms include fever up to 103F at home. Denies having any headache, muscle aches, chest pain, congestion, cough, nausea, vomiting, diarrhea, dysuria, vaginal discharge, urinary frequency, or blood in stool/urine. Patient had been previously diagnosed with pyelonephritis on 2/26 and treated with a seven-day course of Keflex which patient noted symptoms had improved. Patient states that she is followed by Dr. Novella Olive but it appears she has not had a appointment with infectious disease since 06/2015. This was also when her last CD4 count was obtained and noted to be 340.  ED Course: Upon admission into the emergency department patient was seen to be febrile to 103F, pulse of 128, respirations up to 32, O2 saturations maintain a room air, and blood pressure noted to be as low as 96/55 on admission. Lab work revealed WBC 2.8, hemoglobin 6.9, platelets 583, sodium 129, potassium 3.2, and other labs relatively within normal limits. Urinalysis was positive for signs of infection. Chest x-ray was negative for any acute abnormalities. CT scan of the brain showed lymphoid hyperplasia. Patient had initially been started on empiric antibiotics of vancomycin and Zosyn for sepsis of unclear origin.  Review of Systems: As per HPI otherwise 10 point review of systems negative.   Past Medical History:  Diagnosis Date  . Abnormal Pap smear    s/p colposcopy   . Anemia   . Chronic kidney disease 03/2013   kidney infection  . HIV infection ([place])   . Screening for malignant neoplasm of the cervix      Past Surgical History:  Procedure Laterality Date  . CESAREAN SECTION     2000/2008  . COSMETIC SURGERY     2 previous c-sections  . TUBAL LIGATION       reports that she has never smoked. She has never used smokeless tobacco. She reports that she does not drink alcohol or use drugs.  No Known Allergies  Family History  Problem Relation Age of Onset  . Eczema Mother   . Diabetes Father   . Hypertension Father   . Diabetes Maternal Grandmother   . Hypertension Maternal Grandmother   . Diabetes Maternal Grandfather   . Hypertension Maternal Grandfather   . Diabetes Paternal Grandmother   . Hypertension Paternal Grandmother   . Diabetes Paternal Grandfather   . Hypertension Paternal Grandfather     Prior to Admission medications   Medication Sig Start Date End Date Taking? Authorizing Provider  cephALEXin (KEFLEX) 500 MG capsule Take 1 capsule (500 mg total) by mouth 4 (four) times daily. 10/27/16   Merrily Pew, MD  dolutegravir (TIVICAY) 50 MG tablet Take 1 tablet (50 mg total) by mouth daily. 01/23/16   Thayer Headings, MD  HYDROcodone-acetaminophen (NORCO) 5-325 MG tablet Take 1-2 tablets by mouth every 6 (six) hours as needed for severe pain. 09/12/15   John Molpus, MD  IRON, FERROUS GLUCONATE, PO Take 2 tablets by mouth daily.    Historical Provider, MD  naproxen (NAPROSYN) 375 MG tablet Take 1 tablet twice daily as needed for toe pain. Best taken with a meal. 09/12/15  Shanon Rosser, MD  NORVIR 100 MG TABS tablet TAKE 1 TABLET BY MOUTH DAILY 01/23/16   Thayer Headings, MD  ondansetron (ZOFRAN) 4 MG tablet Take 1 tablet (4 mg total) by mouth every 8 (eight) hours as needed for nausea or vomiting. 10/27/16   Merrily Pew, MD  PREZISTA 800 MG tablet TAKE 1 TABLET BY MOUTH DAILY 01/23/16   Thayer Headings, MD  TRUVADA 200-300 MG tablet TAKE 1 TABLET BY MOUTH DAILY 01/23/16   Thayer Headings, MD    Physical Exam:    Constitutional: Young female who appears to be acutely sick, but  nontoxic in appearance. Vitals:   11/25/16 2200 11/25/16 2242 11/25/16 2300 11/26/16 0017  BP: 112/66 110/65 115/78 104/64  Pulse: 91 82 81 78  Resp: (!) 30 20 (!) 28 20  Temp:  97.8 F (36.6 C)    TempSrc:  Oral    SpO2: 100% 100% 100% 100%  Weight:      Height:       Eyes: PERRL, lids and conjunctivae normal ENMT: Mucous membranes are moist. Posterior pharynx clear of any exudate or lesions.Normal dentition.  Neck: normal, supple, no masses, no thyromegaly Respiratory: clear to auscultation bilaterally, no wheezing, no crackles. Normal respiratory effort. No accessory muscle use.  Cardiovascular: Regular rate and rhythm, no murmurs / rubs / gallops. No extremity edema. 2+ pedal pulses. No carotid bruits.  Abdomen: no tenderness, no masses palpated. No hepatosplenomegaly. Bowel sounds positive.  Musculoskeletal: no clubbing / cyanosis. No joint deformity upper and lower extremities. Good ROM, no contractures. Normal muscle tone.  Skin: Diaphoretic. no rashes, lesions, ulcers. No induration Neurologic: CN 2-12 grossly intact. Sensation intact, DTR normal. Strength 5/5 in all 4.  Psychiatric: Poor judgment and insight. Alert and oriented x 3. Normal mood.     Labs on Admission: I have personally reviewed following labs and imaging studies  CBC:  Recent Labs Lab 11/25/16 1006 11/25/16 1852  WBC 9.5 10.8*  NEUTROABS 7,030 7.6  HGB 7.1* 6.9*  HCT 24.4* 23.7*  MCV 64.4* 65.7*  PLT 588* 619*   Basic Metabolic Panel:  Recent Labs Lab 11/25/16 1006 11/25/16 1852  NA 129* 129*  K 3.8 3.2*  CL 98 98*  CO2 20 21*  GLUCOSE 84 96  BUN 8 8  CREATININE 1.04 0.96  CALCIUM 8.9 8.6*   GFR: Estimated Creatinine Clearance: 71.5 mL/min (by C-G formula based on SCr of 0.96 mg/dL). Liver Function Tests:  Recent Labs Lab 11/25/16 1006  AST 19  ALT 7  ALKPHOS 84  BILITOT 0.4  PROT 8.8*  ALBUMIN 3.3*   No results for input(s): LIPASE, AMYLASE in the last 168 hours. No  results for input(s): AMMONIA in the last 168 hours. Coagulation Profile: No results for input(s): INR, PROTIME in the last 168 hours. Cardiac Enzymes: No results for input(s): CKTOTAL, CKMB, CKMBINDEX, TROPONINI in the last 168 hours. BNP (last 3 results) No results for input(s): PROBNP in the last 8760 hours. HbA1C: No results for input(s): HGBA1C in the last 72 hours. CBG: No results for input(s): GLUCAP in the last 168 hours. Lipid Profile: No results for input(s): CHOL, HDL, LDLCALC, TRIG, CHOLHDL, LDLDIRECT in the last 72 hours. Thyroid Function Tests: No results for input(s): TSH, T4TOTAL, FREET4, T3FREE, THYROIDAB in the last 72 hours. Anemia Panel: No results for input(s): VITAMINB12, FOLATE, FERRITIN, TIBC, IRON, RETICCTPCT in the last 72 hours. Urine analysis:    Component Value Date/Time   COLORURINE YELLOW 11/25/2016  1853   APPEARANCEUR CLOUDY (A) 11/25/2016 1853   LABSPEC 1.008 11/25/2016 1853   PHURINE 6.0 11/25/2016 1853   GLUCOSEU NEGATIVE 11/25/2016 1853   HGBUR NEGATIVE 11/25/2016 1853   BILIRUBINUR NEGATIVE 11/25/2016 1853   KETONESUR NEGATIVE 11/25/2016 1853   PROTEINUR 30 (A) 11/25/2016 1853   UROBILINOGEN 0.2 05/23/2014 1655   NITRITE POSITIVE (A) 11/25/2016 1853   LEUKOCYTESUR LARGE (A) 11/25/2016 1853   Sepsis Labs: No results found for this or any previous visit (from the past 240 hour(s)).   Radiological Exams on Admission: Dg Chest 2 View  Result Date: 11/25/2016 CLINICAL DATA:  Worsening fatigue and anorexia. Fever, cough and weakness. HIV patient. EXAM: CHEST  2 VIEW COMPARISON:  Radiographs 03/09/2013 FINDINGS: The cardiomediastinal contours are normal. Mild hypoventilation with bronchovascular crowding at the lung bases. Minimal right basilar atelectasis. Pulmonary vasculature is normal. No consolidation, pleural effusion, or pneumothorax. No acute osseous abnormalities are seen. IMPRESSION: Mild hypoventilation with bronchovascular crowding and  right basilar atelectasis. Electronically Signed   By: Jeb Levering M.D.   On: 11/25/2016 21:43   Ct Head Wo Contrast  Result Date: 11/25/2016 CLINICAL DATA:  Worsening fatigue.  Anorexia.  HIV positive. EXAM: CT HEAD WITHOUT CONTRAST TECHNIQUE: Contiguous axial images were obtained from the base of the skull through the vertex without intravenous contrast. COMPARISON:  None. FINDINGS: Brain: No evidence of parenchymal hemorrhage or extra-axial fluid collection. No mass lesion, mass effect, or midline shift. No CT evidence of acute infarction. Cerebral volume is age appropriate. No ventriculomegaly. Vascular: No hyperdense vessel or unexpected calcification. Skull: No evidence of calvarial fracture. Sinuses/Orbits: The visualized paranasal sinuses are essentially clear. Other: Symmetric thickening of the posterior nasopharyngeal soft tissues. The mastoid air cells are unopacified. IMPRESSION: 1.  No evidence of acute intracranial abnormality. 2. Symmetric thickening of the posterior nasopharyngeal soft tissues, compatible with HIV related lymphoid hyperplasia. Electronically Signed   By: Ilona Sorrel M.D.   On: 11/25/2016 21:48    EKG: Independently reviewed. Sinus tachycardia  Assessment/Plan Sepsis secondary to urinary tract infection: Patient found to be febrile up to 103F, tachycardic, and tachypneic on admission. WBC elevated at 10.8, but lactic acid was reassuring. Urinalysis was found to be positive for signs of infection. Patient was initially placed on respiratory antibiotics of vancomycin and Zosyn. - Admit to telemetry. - Follow-up urine and blood cultures - Continue empiric antibiotics of vancomycin and Zosyn, and de-escalate when medically appropriate - Check renal ultrasound  Microcytic and microchromic anemia: Acute initial hemoglobin noted to be 6.8 on admission. - Checking acute anemia panel - T&S for possible need of PRBCs - Transfuse if hemoglobin drops less than 6.5 or  patient becomes symptomatic  Hypokalemia: Acute. Initial potassium 3.2. - 40 mEq of potassium chloride 1 dose now - Continue to monitor and replace as needed  HIV: Patient reports being followed as an outpatient by Dr. Novella Olive. Patient currently not on any HARRT medications. - Follow-up CD4 count and HIV RNA quantitative studies - Would notify ID in a.m. - Initial likely need to be placed on prophylactic medications for CD4 count.  Thrombocytosis: Acute. Platelets elevated at 583 on admission  - Continue to monitor   DVT prophylaxis: SCDs Code Status: Full Family Communication: No family present at bedside Disposition Plan: Likely discharge home once medically stable  Consults called: None  Admission status: inpatient   Norval Morton MD [facility] Pager [phone]  If 7PM-7AM, please contact night-coverage [email or web address] Password [email or web address]  11/26/2016, 1:23  AM

## 2016-11-26 NOTE — Progress Notes (Signed)
PROGRESS NOTE    TAMA GROSZ  IOX:[id number] DOB: 1976-08-27 DOA: 11/25/2016 PCP: Scharlene Gloss, MD    Brief Narrative: Briana [name] is a 41 y.o. female with medical history significant of  HIV not on HARRT last 12 months; who present with complaints of fatigue and decreased overall appetite over the last 3 weeks. Associated symptoms include fever up to 103F at home. Denies having any headache, muscle aches, chest pain, congestion, cough, nausea, vomiting, diarrhea, dysuria, vaginal discharge, urinary frequency, or blood in stool/urine. Patient had been previously diagnosed with pyelonephritis on 2/26 and treated with a seven-day course of Keflex which patient noted symptoms had improved   Assessment & Plan:   Principal Problem:   Sepsis secondary to UTI [facility]) Active Problems:   Human immunodeficiency virus (HIV) disease ([place])   Thrombocytosis ([place])   Hypokalemia  1-Sepsis; presume related to UTI.  UA with too numerous to count WBC.  Follow urine culture.  Renal US ordered.  Blood culture;  On IV Vancomycin and Zosyn.    2-Anemia; Microcytic; Iron deficiency. Iron at 6. Denies melena, hematochezia, denies heavy menstrual periods.  Presents with Hb at 6.9.  Received one unit of PRBC. Patient still symptomatic. Will transfused another unit of PRBC.  Will need oral iron. Start B 12 supplements. 299,   Hypokalemia;  Received 30 meq IV and 40 meq by mouth.  Check Mg level.   HIV;  RPR negative.  HIV quantitative pending.  CD 4 pending.  ID consulted.    DVT prophylaxis: SCD.  Code Status: Full code.  Family Communication: Care discussed with patient.  Disposition Plan: remain inpatient.   Consultants:   ID  Procedures:  Renal US.   Antimicrobials: Vancomycin and Zosyn 3-28  Subjective: She is still feeling weak. Report mild non productive cough. Denies dysuria.  Denies headaches, sinus congestion.    Objective: Vitals:   11/26/16 0345 11/26/16  0403 11/26/16 0407 11/26/16 0541  BP: (!) 95/54 (!) 95/52  103/62  Pulse: 78 78 80 70  Resp: 16   16  Temp: 98.6 F (37 C)   98.8 F (37.1 C)  TempSrc: Rectal   Rectal  SpO2: 100%  100% 100%  Weight:      Height:        Intake/Output Summary (Last 24 hours) at 11/26/16 0756 Last data filed at 11/26/16 0541  Gross per 24 hour  Intake              355 ml  Output                0 ml  Net              355 ml   Filed Weights   11/25/16 1829  Weight: 68 kg (150 lb)    Examination:  General exam: Appears calm and comfortable  Respiratory system: Clear to auscultation. Respiratory effort normal. Cardiovascular system: S1 & S2 heard, RRR. No JVD, murmurs, rubs, gallops or clicks. No pedal edema. Gastrointestinal system: Abdomen is nondistended, soft and nontender. No organomegaly or masses felt. Normal bowel sounds heard. Central nervous system: Alert and oriented. No focal neurological deficits. Extremities: Symmetric 5 x 5 power. Skin: No rashes, lesions or ulcers Psychiatry: Judgement and insight appear normal. Mood & affect appropriate.     Data Reviewed: I have personally reviewed following labs and imaging studies  CBC:  Recent Labs Lab 11/25/16 1006 11/25/16 1852 11/26/16 0158  WBC 9.5 10.8* 5.6  NEUTROABS  7,030 7.6  --   HGB 7.1* 6.9* 6.4*  HCT 24.4* 23.7* 22.7*  MCV 64.4* 65.7* 65.8*  PLT 588* 583* 825*   Basic Metabolic Panel:  Recent Labs Lab 11/25/16 1006 11/25/16 1852 11/26/16 0158  NA 129* 129* 136  K 3.8 3.2* 2.8*  CL 98 98* 104  CO2 20 21* 20*  GLUCOSE 84 96 105*  BUN 8 8 8   CREATININE 1.04 0.96 1.13*  CALCIUM 8.9 8.6* 8.2*   GFR: Estimated Creatinine Clearance: 60.7 mL/min (A) (by C-G formula based on SCr of 1.13 mg/dL (H)). Liver Function Tests:  Recent Labs Lab 11/25/16 1006 11/26/16 0158  AST 19 20  ALT 7 9*  ALKPHOS 84 71  BILITOT 0.4 0.7  PROT 8.8* 7.6  ALBUMIN 3.3* 2.3*   No results for input(s): LIPASE, AMYLASE in the  last 168 hours. No results for input(s): AMMONIA in the last 168 hours. Coagulation Profile:  Recent Labs Lab 11/26/16 0158  INR 1.18   Cardiac Enzymes: No results for input(s): CKTOTAL, CKMB, CKMBINDEX, TROPONINI in the last 168 hours. BNP (last 3 results) No results for input(s): PROBNP in the last 8760 hours. HbA1C: No results for input(s): HGBA1C in the last 72 hours. CBG: No results for input(s): GLUCAP in the last 168 hours. Lipid Profile: No results for input(s): CHOL, HDL, LDLCALC, TRIG, CHOLHDL, LDLDIRECT in the last 72 hours. Thyroid Function Tests: No results for input(s): TSH, T4TOTAL, FREET4, T3FREE, THYROIDAB in the last 72 hours. Anemia Panel:  Recent Labs  11/26/16 0158  VITAMINB12 299  FOLATE 16.9  FERRITIN 158  TIBC 220*  IRON 6*  RETICCTPCT 1.0   Sepsis Labs:  Recent Labs Lab 11/25/16 1923 11/25/16 2245 11/26/16 0158  PROCALCITON  --   --  0.49  LATICACIDVEN 1.91* 0.44*  --     No results found for this or any previous visit (from the past 240 hour(s)).       Radiology Studies: Dg Chest 2 View  Result Date: 11/25/2016 CLINICAL DATA:  Worsening fatigue and anorexia. Fever, cough and weakness. HIV patient. EXAM: CHEST  2 VIEW COMPARISON:  Radiographs 03/09/2013 FINDINGS: The cardiomediastinal contours are normal. Mild hypoventilation with bronchovascular crowding at the lung bases. Minimal right basilar atelectasis. Pulmonary vasculature is normal. No consolidation, pleural effusion, or pneumothorax. No acute osseous abnormalities are seen. IMPRESSION: Mild hypoventilation with bronchovascular crowding and right basilar atelectasis. Electronically Signed   By: Jeb Levering M.D.   On: 11/25/2016 21:43   Ct Head Wo Contrast  Result Date: 11/25/2016 CLINICAL DATA:  Worsening fatigue.  Anorexia.  HIV positive. EXAM: CT HEAD WITHOUT CONTRAST TECHNIQUE: Contiguous axial images were obtained from the base of the skull through the vertex without  intravenous contrast. COMPARISON:  None. FINDINGS: Brain: No evidence of parenchymal hemorrhage or extra-axial fluid collection. No mass lesion, mass effect, or midline shift. No CT evidence of acute infarction. Cerebral volume is age appropriate. No ventriculomegaly. Vascular: No hyperdense vessel or unexpected calcification. Skull: No evidence of calvarial fracture. Sinuses/Orbits: The visualized paranasal sinuses are essentially clear. Other: Symmetric thickening of the posterior nasopharyngeal soft tissues. The mastoid air cells are unopacified. IMPRESSION: 1.  No evidence of acute intracranial abnormality. 2. Symmetric thickening of the posterior nasopharyngeal soft tissues, compatible with HIV related lymphoid hyperplasia. Electronically Signed   By: Ilona Sorrel M.D.   On: 11/25/2016 21:48        Scheduled Meds: . sodium chloride   Intravenous Once  . piperacillin-tazobactam (ZOSYN)  IV  3.375 g Intravenous Q8H  . potassium chloride (KCL MULTIRUN) 30 mEq in 265 mL IVPB  30 mEq Intravenous Once  . vancomycin  750 mg Intravenous Q12H   Continuous Infusions: . sodium chloride 100 mL/hr at 11/26/16 0156     LOS: 1 day    Time spent: 35 minutes.     Elmarie Shiley, MD [facility] Pager [phone]  If 7PM-7AM, please contact night-coverage [email or web address] Password [facility] 11/26/2016, 7:56 AM

## 2016-11-26 NOTE — Progress Notes (Signed)
CRITICAL VALUE ALERT  Critical value received:  hgb 6.4  Date of notification:  11/26/16    Time of notification:  0239  Critical value read back: yes  Nurse who received alert: Cordelia Pen  MD notified (1st page):  R. smith  Time of first page:  [phone]

## 2016-11-26 NOTE — Progress Notes (Signed)
Initial Nutrition Assessment  DOCUMENTATION CODES:   Not applicable  INTERVENTION:   Ensure Enlive po BID, each supplement provides 350 kcal and 20 grams of protein  NUTRITION DIAGNOSIS:   Increased nutrient needs related to chronic illness (HIV) as evidenced by estimated needs.  GOAL:   Patient will meet greater than or equal to 90% of their needs  MONITOR:   PO intake, Supplement acceptance, Labs, Weight trends, Skin, I & O's  REASON FOR ASSESSMENT:   Malnutrition Screening Tool    ASSESSMENT:   Briana [name] is a 41 y.o. female with medical history significant of  HIV not on HARRT last 12 months; who present with complaints of fatigue and decreased overall appetite over the last 3 weeks. Associated symptoms include fever up to 103F at home.   Pt admitted with sepsis secondary to UTI.   Pt sleeping soundly at time of visit and did not arouse during exam or when this RD called her name.   Reviewed breakfast tray at bedside, which was unattempted.   Reviewed wt hx; noted UBW is around 160#. Pt is 94% of usual body weight. Per chart review, pt was eating poorly PTA. Given increased nutrient needs for HIV and poor appetite, pt would benefit from addition of nutritional supplements. RD to order.   Nutrition-Focused physical exam completed. Findings are no fat depletion, no muscle depletion, and no edema.   Labs reviewed: K: 2.8, Mg: 3.2.   Diet Order:  Diet regular Room service appropriate? Yes; Fluid consistency: Thin  Skin:  Reviewed, no issues  Last BM:  11/26/16  Height:   Ht Readings from Last 1 Encounters:  11/25/16 5\' 5"  (1.651 m)    Weight:   Wt Readings from Last 1 Encounters:  11/25/16 150 lb (68 kg)    Ideal Body Weight:  56.8 kg  BMI:  Body mass index is 24.96 kg/m.  Estimated Nutritional Needs:   Kcal:  1800-2000  Protein:  90-105 grams  Fluid:  1.8-2.0 L  EDUCATION NEEDS:   No education needs identified at this time  Champagne Paletta  A. Jimmye Norman, RD, LDN, CDE Pager: [phone] After hours Pager: [phone]
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
## 2016-11-27 ENCOUNTER — Inpatient Hospital Stay (HOSPITAL_COMMUNITY): Payer: BC Managed Care – PPO

## 2016-11-27 DIAGNOSIS — Z6824 Body mass index (BMI) 24.0-24.9, adult: Secondary | ICD-10-CM

## 2016-11-27 DIAGNOSIS — T7840XA Allergy, unspecified, initial encounter: Secondary | ICD-10-CM

## 2016-11-27 DIAGNOSIS — K130 Diseases of lips: Secondary | ICD-10-CM

## 2016-11-27 DIAGNOSIS — R509 Fever, unspecified: Secondary | ICD-10-CM

## 2016-11-27 LAB — BASIC METABOLIC PANEL
Anion gap: 7 (ref 5–15)
BUN: <5 mg/dL — ABNORMAL LOW (ref 6–20)
CALCIUM: 8.7 mg/dL — AB (ref 8.9–10.3)
CHLORIDE: 114 mmol/L — AB (ref 101–111)
CO2: 19 mmol/L — ABNORMAL LOW (ref 22–32)
CREATININE: 0.84 mg/dL (ref 0.44–1.00)
GFR calc non Af Amer: >60 mL/min (ref >60)
Glucose, Bld: 86 mg/dL (ref 65–99)
Potassium: 4 mmol/L (ref 3.5–5.1)
SODIUM: 140 mmol/L (ref 135–145)

## 2016-11-27 LAB — BPAM RBC
BLOOD PRODUCT EXPIRATION DATE: 201804022359
BLOOD PRODUCT EXPIRATION DATE: 201804092359
ISSUE DATE / TIME: 201803281108
ISSUE DATE / TIME: 201803280313
UNIT TYPE AND RH: 9500
Unit Type and Rh: 5100

## 2016-11-27 LAB — URINE CULTURE

## 2016-11-27 LAB — CBC
HCT: 28.9 % — ABNORMAL LOW (ref 36.0–46.0)
HEMOGLOBIN: 8.5 g/dL — AB (ref 12.0–15.0)
MCH: 20.5 pg — ABNORMAL LOW (ref 26.0–34.0)
MCHC: 29.4 g/dL — AB (ref 30.0–36.0)
MCV: 69.8 fL — ABNORMAL LOW (ref 78.0–100.0)
PLATELETS: 417 10*3/uL — AB (ref 150–400)
RBC: 4.14 MIL/uL (ref 3.87–5.11)
RDW: 19.1 % — ABNORMAL HIGH (ref 11.5–15.5)
WBC: 6.4 10*3/uL (ref 4.0–10.5)

## 2016-11-27 LAB — TYPE AND SCREEN
ABO/RH(D): O POS
Antibody Screen: NEGATIVE
UNIT DIVISION: 0
Unit division: 0

## 2016-11-27 LAB — URINE CYTOLOGY ANCILLARY ONLY
CHLAMYDIA, DNA PROBE: NEGATIVE
Neisseria Gonorrhea: NEGATIVE

## 2016-11-27 MED ORDER — LORATADINE 10 MG PO TABS
10.0000 mg | ORAL_TABLET | Freq: Every day | ORAL | Status: DC
  Administered 2016-11-28: 10 mg via ORAL
  Filled 2016-11-27: qty 1

## 2016-11-27 MED ORDER — LEVOFLOXACIN 500 MG PO TABS
500.0000 mg | ORAL_TABLET | Freq: Every day | ORAL | Status: DC
  Administered 2016-11-27 – 2016-11-28 (×2): 500 mg via ORAL
  Filled 2016-11-27 (×2): qty 1

## 2016-11-27 MED ORDER — FAMOTIDINE IN NACL 20-0.9 MG/50ML-% IV SOLN
20.0000 mg | Freq: Two times a day (BID) | INTRAVENOUS | Status: DC
  Administered 2016-11-27: 20 mg via INTRAVENOUS
  Filled 2016-11-27 (×2): qty 50

## 2016-11-27 MED ORDER — METHYLPREDNISOLONE SODIUM SUCC 125 MG IJ SOLR
60.0000 mg | Freq: Two times a day (BID) | INTRAMUSCULAR | Status: DC
  Administered 2016-11-27 – 2016-11-28 (×2): 60 mg via INTRAVENOUS
  Filled 2016-11-27 (×2): qty 2

## 2016-11-27 MED ORDER — BOOST / RESOURCE BREEZE PO LIQD
1.0000 | Freq: Three times a day (TID) | ORAL | Status: DC
  Administered 2016-11-27 (×2): 1 via ORAL

## 2016-11-27 MED ORDER — SODIUM CHLORIDE 0.9 % IV SOLN: 25.0000 mg | Freq: Four times a day (QID) | INTRAVENOUS | Status: DC | PRN

## 2016-11-27 MED ORDER — VITAMIN B-12 100 MCG PO TABS
100.0000 ug | ORAL_TABLET | Freq: Every day | ORAL | Status: DC
  Administered 2016-11-27 – 2016-11-28 (×2): 100 ug via ORAL
  Filled 2016-11-27 (×2): qty 1

## 2016-11-27 MED ORDER — FERROUS SULFATE 325 (65 FE) MG PO TABS
325.0000 mg | ORAL_TABLET | Freq: Two times a day (BID) | ORAL | Status: DC
Start: 2016-11-27 — End: 2016-11-28
  Administered 2016-11-27 – 2016-11-28 (×2): 325 mg via ORAL
  Filled 2016-11-27 (×2): qty 1

## 2016-11-27 NOTE — Progress Notes (Signed)
Patient ID: Briana [name], female   DOB: [birth year], 41 y.o.   MRN: [id number]          [facility] for Infectious Disease  Date of Admission:  11/25/2016   Total days of antibiotics 2         Principal Problem:   Fever Active Problems:   Human immunodeficiency virus (HIV) disease (HCC)   Thrombocytosis (HCC)   Hypokalemia   . sodium chloride   Intravenous Once  . cefTRIAXone (ROCEPHIN)  IV  1 g Intravenous Q24H  . famotidine (PEPCID) IV  20 mg Intravenous Q12H  . feeding supplement  1 Container Oral TID BM  . ferrous sulfate  325 mg Oral BID WC  . loratadine  10 mg Oral Daily  . methylPREDNISolone (SOLU-MEDROL) injection  60 mg Intravenous Q12H  . vancomycin  750 mg Intravenous Q12H  . vitamin B-12  100 mcg Oral Daily    SUBJECTIVE: Her fever has subsided and she is feeling better. She developed swelling and itching of her upper lip right after eating a piece of pumpkin pie. She had a similar episode many years ago after eating a honey bun.  Review of Systems: Review of Systems  Constitutional: Negative for chills, diaphoresis, fever, malaise/fatigue and weight loss.  HENT: Negative for sore throat.   Respiratory: Negative for cough, sputum production and shortness of breath.   Cardiovascular: Negative for chest pain.  Gastrointestinal: Negative for abdominal pain, diarrhea, heartburn, nausea and vomiting.  Genitourinary: Negative for dysuria and frequency.  Musculoskeletal: Negative for joint pain and myalgias.  Skin: Positive for itching. Negative for rash.       As noted in history of present illness.  Neurological: Negative for dizziness and headaches.    Past Medical History:  Diagnosis Date  . Abnormal Pap smear    s/p colposcopy   . Anemia   . Chronic kidney disease 03/2013   kidney infection  . HIV infection ([place])   . Screening for malignant neoplasm of the cervix     Social History  Substance Use Topics  . Smoking status: Never Smoker  .  Smokeless tobacco: Never Used  . Alcohol use No    Family History  Problem Relation Age of Onset  . Eczema Mother   . Diabetes Father   . Hypertension Father   . Diabetes Maternal Grandmother   . Hypertension Maternal Grandmother   . Diabetes Maternal Grandfather   . Hypertension Maternal Grandfather   . Diabetes Paternal Grandmother   . Hypertension Paternal Grandmother   . Diabetes Paternal Grandfather   . Hypertension Paternal Grandfather    No Known Allergies  OBJECTIVE: Vitals:   11/26/16 1424 11/26/16 1515 11/26/16 2159 11/27/16 0537  BP: 129/72 106/61 119/74 116/88  Pulse: (!) 102 (!) 109 86 77  Resp: 16 (!) 30 17 18   Temp: 98.9 F (37.2 C) 100.3 F (37.9 C) 98.2 F (36.8 C) 98.1 F (36.7 C)  TempSrc: Oral Oral  Oral  SpO2: 100% 100% 100% 100%  Weight:      Height:       Body mass index is 24.96 kg/m.  Physical Exam  Constitutional: She is oriented to person, place, and time.  She is in good spirits. She is sitting up in bed.  HENT:  Mouth/Throat: No oropharyngeal exudate.  She does have some diffuse swelling of her upper lip.  Eyes: Conjunctivae are normal.  Cardiovascular: Normal rate and regular rhythm.   No murmur heard. Pulmonary/Chest: Breath  sounds normal.  Abdominal: Soft. She exhibits no mass. There is no tenderness.  Musculoskeletal: Normal range of motion.  Neurological: She is alert and oriented to person, place, and time.  Skin: No rash noted.  Psychiatric: Mood and affect normal.    Lab Results Lab Results  Component Value Date   WBC 6.4 11/27/2016   HGB 8.5 (L) 11/27/2016   HCT 28.9 (L) 11/27/2016   MCV 69.8 (L) 11/27/2016   PLT 417 (H) 11/27/2016    Lab Results  Component Value Date   CREATININE 0.84 11/27/2016   BUN <5 (L) 11/27/2016   NA 140 11/27/2016   K 4.0 11/27/2016   CL 114 (H) 11/27/2016   CO2 19 (L) 11/27/2016    Lab Results  Component Value Date   ALT 9 (L) 11/26/2016   AST 20 11/26/2016   ALKPHOS 71  11/26/2016   BILITOT 0.7 11/26/2016     Microbiology: Recent Results (from the past 240 hour(s))  Blood Culture (routine x 2)     Status: None (Preliminary result)   Collection Time: 11/25/16  7:19 PM  Result Value Ref Range Status   Specimen Description BLOOD LEFT AC  Final   Special Requests BOTTLES DRAWN AEROBIC AND ANAEROBIC 5CC EACH  Final   Culture   Final    NO GROWTH 2 DAYS Performed at [facility], [place] [address], [place], [place] [address]    Report Status PENDING  Incomplete  Blood Culture (routine x 2)     Status: None (Preliminary result)   Collection Time: 11/25/16  7:22 PM  Result Value Ref Range Status   Specimen Description BLOOD RIGHT WRIST  Final   Special Requests BOTTLES DRAWN AEROBIC AND ANAEROBIC 5CC EACH  Final   Culture   Final    NO GROWTH 2 DAYS Performed at [facility], [place] [address], [place], [place] [address]    Report Status PENDING  Incomplete  Culture, Urine     Status: Abnormal   Collection Time: 11/26/16  1:53 PM  Result Value Ref Range Status   Specimen Description URINE, CLEAN CATCH  Final   Special Requests NONE  Final   Culture <10,000 COLONIES/mL INSIGNIFICANT GROWTH (A)  Final   Report Status 11/27/2016 FINAL  Final     ASSESSMENT: The cause of her recent fever is unclear. Her CD4 count is down to 150 but I do not think that she is likely to have an HIV related opportunistic infection at this level. She does not have any symptoms to suggest a urinary tract infection. She has defervesced coincident with starting empiric antibiotic therapy. I will change her to oral levofloxacin and plan on 7 more days of therapy. This will also allow [place] to get her off of vancomycin and ceftriaxone in case she is developing an allergic reaction with lip swelling to one of the antibiotics.  PLAN: 1. Discontinue vancomycin and ceftriaxone 2. Start levofloxacin 3. She has a follow-up appointment scheduled for our clinic on 12/09/2016  Michel Bickers, [place] for [place] [phone] pager   [phone] cell 11/27/2016, 3:36 PM

## 2016-11-27 NOTE — Care Management Note (Signed)
Case Management Note  Patient Details  Name: Briana [name] MRN: [id number] Date of Birth: [birth year]  Subjective/Objective:    Admitted with sepsis/ UTI , hx of untreated HIV infection. From home with family. Independent with ADL's , no DME usage.                               Action/Plan: ID following...Marland KitchenMarland KitchenPlan is to d/c to home when medically stable. CM to f/u with disposition needs. Post hospital follow scheduled for 12/01/2016 @ 2:30pm  with Zettie Pho NP.  Expected Discharge Date:  11/28/16               Expected Discharge Plan:  Home/Self Care  In-House Referral:   PCP / [facility], CM provided pt with [facility] information to assist with finding PCP.  Discharge planning Services  CM Consult  Post Acute Care Choice:    Choice offered to:     DME Arranged:    DME Agency:     HH Arranged:    HH Agency:     Status of Service:  Completed, signed off  If discussed at [place] of Stay Meetings, dates discussed:    Additional Comments:  Sharin Mons, RN 11/27/2016, 11:47 AM

## 2016-11-27 NOTE — Progress Notes (Signed)
PROGRESS NOTE    DEBRA COLON  ONG:[id number] DOB: 12-23-1975 DOA: 11/25/2016 PCP: Scharlene Gloss, MD    Brief Narrative: Briana [name] is a 41 y.o. female with medical history significant of  HIV not on HARRT last 12 months; who present with complaints of fatigue and decreased overall appetite over the last 3 weeks. Associated symptoms include fever up to 103F at home. Denies having any headache, muscle aches, chest pain, congestion, cough, nausea, vomiting, diarrhea, dysuria, vaginal discharge, urinary frequency, or blood in stool/urine. Patient had been previously diagnosed with pyelonephritis on 2/26 and treated with a seven-day course of Keflex which patient noted symptoms had improved   Assessment & Plan:   Principal Problem:   Fever Active Problems:   Human immunodeficiency virus (HIV) disease ([place])   Thrombocytosis (HCC)   Hypokalemia  1-Lips swelling;  Report swelling of lips after eating a Pie today. Feels her throat  Scratchy.  Will add benadryl PRN, Solumedrol and Pepcid.  Monitor overnight.  Will need to monitor closely on currents antibiotics.   2-Sepsis; presume related to UTI.  UA with too numerous to count WBC.  Urine culture 10,000 colonies insignificant growth  Renal US negative for pyelonephritis.  Blood culture; no growth to date.  On IV Vancomycin and ceftriaxone.    3-Anemia; Microcytic; Iron deficiency. Iron at 6. Denies melena, hematochezia, denies heavy menstrual periods.  Presents with Hb at 6.9.  Received total of 2 units of PRBC. Hb increased to 8.5 Started on ferrous sulfate and B 12 supplements   4-Hypokalemia;  Resolved.   5-HIV;  RPR negative.  HIV quantitative pending.  CD 4 150 ID consulted. Appreciate Dr Megan Salon help.    DVT prophylaxis: SCD.  Code Status: Full code.  Family Communication: Care discussed with patient.  Disposition Plan: remain inpatient.   Consultants:   ID  Procedures:  Renal US.    Antimicrobials: Vancomycin and Zosyn 3-28  Subjective: She is feeling better. Denies dyspnea.  She develop swelling of her lips and scratchy throat after eating pie during lunch.    Objective: Vitals:   11/26/16 1424 11/26/16 1515 11/26/16 2159 11/27/16 0537  BP: 129/72 106/61 119/74 116/88  Pulse: (!) 102 (!) 109 86 77  Resp: 16 (!) 30 17 18   Temp: 98.9 F (37.2 C) 100.3 F (37.9 C) 98.2 F (36.8 C) 98.1 F (36.7 C)  TempSrc: Oral Oral  Oral  SpO2: 100% 100% 100% 100%  Weight:      Height:        Intake/Output Summary (Last 24 hours) at 11/27/16 1504 Last data filed at 11/27/16 4401  Gross per 24 hour  Intake          2098.33 ml  Output                4 ml  Net          2094.33 ml   Filed Weights   11/25/16 1829  Weight: 68 kg (150 lb)    Examination:  General exam: Appears calm and comfortable , lips swollen.  Respiratory system: Clear to auscultation. Respiratory effort normal. Cardiovascular system: S1 & S2 heard, RRR. No JVD, murmurs, rubs, gallops or clicks. No pedal edema. Gastrointestinal system: Abdomen is nondistended, soft and nontender. No organomegaly or masses felt. Normal bowel sounds heard. Central nervous system: Alert and oriented. No focal neurological deficits. Extremities: Symmetric 5 x 5 power. Skin: No rashes, lesions or ulcers Psychiatry: Judgement and insight appear normal. Mood &  affect appropriate.     Data Reviewed: I have personally reviewed following labs and imaging studies  CBC:  Recent Labs Lab 11/25/16 1006 11/25/16 1852 11/26/16 0158 11/27/16 0618  WBC 9.5 10.8* 5.6 6.4  NEUTROABS 7,030 7.6  --   --   HGB 7.1* 6.9* 6.4* 8.5*  HCT 24.4* 23.7* 22.7* 28.9*  MCV 64.4* 65.7* 65.8* 69.8*  PLT 588* 583* 428* 782*   Basic Metabolic Panel:  Recent Labs Lab 11/25/16 1006 11/25/16 1852 11/26/16 0158 11/26/16 0927 11/27/16 0618  NA 129* 129* 136  --  140  K 3.8 3.2* 2.8*  --  4.0  CL 98 98* 104  --  114*  CO2 20  21* 20*  --  19*  GLUCOSE 84 96 105*  --  86  BUN 8 8 8   --  <5*  CREATININE 1.04 0.96 1.13*  --  0.84  CALCIUM 8.9 8.6* 8.2*  --  8.7*  MG  --   --   --  3.2*  --    GFR: Estimated Creatinine Clearance: 81.7 mL/min (by C-G formula based on SCr of 0.84 mg/dL). Liver Function Tests:  Recent Labs Lab 11/25/16 1006 11/26/16 0158  AST 19 20  ALT 7 9*  ALKPHOS 84 71  BILITOT 0.4 0.7  PROT 8.8* 7.6  ALBUMIN 3.3* 2.3*   No results for input(s): LIPASE, AMYLASE in the last 168 hours. No results for input(s): AMMONIA in the last 168 hours. Coagulation Profile:  Recent Labs Lab 11/26/16 0158  INR 1.18   Cardiac Enzymes: No results for input(s): CKTOTAL, CKMB, CKMBINDEX, TROPONINI in the last 168 hours. BNP (last 3 results) No results for input(s): PROBNP in the last 8760 hours. HbA1C: No results for input(s): HGBA1C in the last 72 hours. CBG: No results for input(s): GLUCAP in the last 168 hours. Lipid Profile: No results for input(s): CHOL, HDL, LDLCALC, TRIG, CHOLHDL, LDLDIRECT in the last 72 hours. Thyroid Function Tests: No results for input(s): TSH, T4TOTAL, FREET4, T3FREE, THYROIDAB in the last 72 hours. Anemia Panel:  Recent Labs  11/26/16 0158  VITAMINB12 299  FOLATE 16.9  FERRITIN 158  TIBC 220*  IRON 6*  RETICCTPCT 1.0   Sepsis Labs:  Recent Labs Lab 11/25/16 1923 11/25/16 2245 11/26/16 0158  PROCALCITON  --   --  0.49  LATICACIDVEN 1.91* 0.44*  --     Recent Results (from the past 240 hour(s))  Blood Culture (routine x 2)     Status: None (Preliminary result)   Collection Time: 11/25/16  7:19 PM  Result Value Ref Range Status   Specimen Description BLOOD LEFT AC  Final   Special Requests BOTTLES DRAWN AEROBIC AND ANAEROBIC 5CC EACH  Final   Culture   Final    NO GROWTH 2 DAYS Performed at [facility], [place] [address] [place], [place] [address]    Report Status PENDING  Incomplete  Blood Culture (routine x 2)     Status: None  (Preliminary result)   Collection Time: 11/25/16  7:22 PM  Result Value Ref Range Status   Specimen Description BLOOD RIGHT WRIST  Final   Special Requests BOTTLES DRAWN AEROBIC AND ANAEROBIC 5CC EACH  Final   Culture   Final    NO GROWTH 2 DAYS Performed at [facility], [place] [address], [place], [place] [address]    Report Status PENDING  Incomplete  Culture, Urine     Status: Abnormal   Collection Time: 11/26/16  1:53 PM  Result Value Ref Range Status   Specimen Description URINE, CLEAN CATCH  Final   Special Requests NONE  Final   Culture <10,000 COLONIES/mL INSIGNIFICANT GROWTH (A)  Final   Report Status 11/27/2016 FINAL  Final         Radiology Studies: Dg Chest 2 View  Result Date: 11/25/2016 CLINICAL DATA:  Worsening fatigue and anorexia. Fever, cough and weakness. HIV patient. EXAM: CHEST  2 VIEW COMPARISON:  Radiographs 03/09/2013 FINDINGS: The cardiomediastinal contours are normal. Mild hypoventilation with bronchovascular crowding at the lung bases. Minimal right basilar atelectasis. Pulmonary vasculature is normal. No consolidation, pleural effusion, or pneumothorax. No acute osseous abnormalities are seen. IMPRESSION: Mild hypoventilation with bronchovascular crowding and right basilar atelectasis. Electronically Signed   By: Jeb Levering M.D.   On: 11/25/2016 21:43   Ct Head Wo Contrast  Result Date: 11/25/2016 CLINICAL DATA:  Worsening fatigue.  Anorexia.  HIV positive. EXAM: CT HEAD WITHOUT CONTRAST TECHNIQUE: Contiguous axial images were obtained from the base of the skull through the vertex without intravenous contrast. COMPARISON:  None. FINDINGS: Brain: No evidence of parenchymal hemorrhage or extra-axial fluid collection. No mass lesion, mass effect, or midline shift. No CT evidence of acute infarction. Cerebral volume is age appropriate. No ventriculomegaly. Vascular: No hyperdense vessel or unexpected calcification. Skull: No evidence of calvarial  fracture. Sinuses/Orbits: The visualized paranasal sinuses are essentially clear. Other: Symmetric thickening of the posterior nasopharyngeal soft tissues. The mastoid air cells are unopacified. IMPRESSION: 1.  No evidence of acute intracranial abnormality. 2. Symmetric thickening of the posterior nasopharyngeal soft tissues, compatible with HIV related lymphoid hyperplasia. Electronically Signed   By: Ilona Sorrel M.D.   On: 11/25/2016 21:48   US Renal  Result Date: 11/27/2016 CLINICAL DATA:  Urinary tract infection. EXAM: RENAL / URINARY TRACT ULTRASOUND COMPLETE COMPARISON:  None. FINDINGS: Right Kidney: Length: 12.3 cm. Echogenicity within normal limits. No mass or hydronephrosis visualized. No perinephric fluid collection. Left Kidney: Length: 11.9 cm. Echogenicity within normal limits. Simple cyst in the lower right kidney measures 1.3 cm. No solid mass or hydronephrosis visualized. No perinephric fluid collection. Bladder: Appears normal for degree of bladder distention. Both ureteral jets are visualized. IMPRESSION: Small left renal cyst.  Otherwise unremarkable renal ultrasound. Electronically Signed   By: Jeb Levering M.D.   On: 11/27/2016 03:01        Scheduled Meds: . sodium chloride   Intravenous Once  . cefTRIAXone (ROCEPHIN)  IV  1 g Intravenous Q24H  . famotidine (PEPCID) IV  20 mg Intravenous Q12H  . feeding supplement  1 Container Oral TID BM  . ferrous sulfate  325 mg Oral BID WC  . loratadine  10 mg Oral Daily  . methylPREDNISolone (SOLU-MEDROL) injection  60 mg Intravenous Q12H  . vancomycin  750 mg Intravenous Q12H  . vitamin B-12  100 mcg Oral Daily   Continuous Infusions: . sodium chloride 100 mL/hr at 11/26/16 2349     LOS: 2 days    Time spent: 35 minutes.     Elmarie Shiley, MD [facility] Pager [phone]  If 7PM-7AM, please contact night-coverage [email or web address] Password [facility] 11/27/2016, 3:04 PM

## 2016-11-27 NOTE — Progress Notes (Signed)
Nutrition Follow-up  DOCUMENTATION CODES:   Not applicable  INTERVENTION:   -D/c Ensure Enlive po BID, each supplement provides 350 kcal and 20 grams of protein -Boost Breeze po TID, each supplement provides 250 kcal and 9 grams of protein  NUTRITION DIAGNOSIS:   Increased nutrient needs related to chronic illness (HIV) as evidenced by estimated needs.  Ongoing  GOAL:   Patient will meet greater than or equal to 90% of their needs  Progressing  MONITOR:   PO intake, Supplement acceptance, Labs, Weight trends, Skin, I & O's  REASON FOR ASSESSMENT:   Malnutrition Screening Tool    ASSESSMENT:   Briana [name] is a 41 y.o. female with medical history significant of  HIV not on HARRT last 12 months; who present with complaints of fatigue and decreased overall appetite over the last 3 weeks. Associated symptoms include fever up to 103F at home.   Pt reports declining appetite over the past 3-4 weeks. Typical intake is several snacks throughout the day (potato chips, candy bars, etc). 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 Of note, pt has not been working over the past 2-3 weeks related to weakness from acute illness.   Pt estimates that she has lost about 20# over the past 2-3 weeks, however, no data is available to confirm this.   Pt reports that her appetite still is not 100%; consumed a few bites of bacon and fruit this AM. Pt does not like Ensure supplements, but is amenable to [employer].  Labs reviewed.   Diet Order:  Diet regular Room service appropriate? Yes; Fluid consistency: Thin  Skin:  Reviewed, no issues  Last BM:  11/26/16  Height:   Ht Readings from Last 1 Encounters:  11/25/16 5\' 5"  (1.651 m)    Weight:   Wt Readings from Last 1 Encounters:  11/25/16 150 lb (68 kg)    Ideal Body Weight:  56.8 kg  BMI:  Body mass index is 24.96 kg/m.  Estimated Nutritional Needs:    Kcal:  1800-2000  Protein:  90-105 grams  Fluid:  1.8-2.0 L  EDUCATION NEEDS:   No education needs identified at this time  Libertie Hausler A. Jimmye Norman, RD, LDN, CDE Pager: [phone] After hours Pager: [phone]

## 2016-11-28 DIAGNOSIS — Z9889 Other specified postprocedural states: Secondary | ICD-10-CM

## 2016-11-28 DIAGNOSIS — Z91018 Allergy to other foods: Secondary | ICD-10-CM

## 2016-11-28 LAB — BASIC METABOLIC PANEL
Anion gap: 10 (ref 5–15)
BUN: 5 mg/dL — AB (ref 6–20)
CO2: 18 mmol/L — ABNORMAL LOW (ref 22–32)
CREATININE: 0.6 mg/dL (ref 0.44–1.00)
Calcium: 9.1 mg/dL (ref 8.9–10.3)
Chloride: 110 mmol/L (ref 101–111)
GFR calc Af Amer: >60 mL/min (ref >60)
GLUCOSE: 108 mg/dL — AB (ref 65–99)
Potassium: 4.4 mmol/L (ref 3.5–5.1)
SODIUM: 138 mmol/L (ref 135–145)

## 2016-11-28 LAB — OCCULT BLOOD X 1 CARD TO LAB, STOOL: FECAL OCCULT BLD: NEGATIVE

## 2016-11-28 LAB — HIV-1 RNA,QN PCR W/REFLEX GENOTYPE
HIV-1 RNA, QN PCR: 90200 {copies}/mL — AB
HIV-1 RNA, QN PCR: 4.96 Log cps/mL — ABNORMAL HIGH

## 2016-11-28 MED ORDER — CYANOCOBALAMIN 100 MCG PO TABS: 100.0000 ug | ORAL_TABLET | Freq: Every day | ORAL | 0 refills | Status: DC

## 2016-11-28 MED ORDER — LORATADINE 10 MG PO TABS: 10.0000 mg | ORAL_TABLET | Freq: Every day | ORAL | 0 refills | Status: DC

## 2016-11-28 MED ORDER — SODIUM BICARBONATE 650 MG PO TABS: 650.0000 mg | ORAL_TABLET | Freq: Two times a day (BID) | ORAL | 0 refills | Status: DC

## 2016-11-28 MED ORDER — BOOST / RESOURCE BREEZE PO LIQD: 1.0000 | Freq: Three times a day (TID) | ORAL | 0 refills | Status: DC

## 2016-11-28 MED ORDER — LEVOFLOXACIN 500 MG PO TABS: 500.0000 mg | ORAL_TABLET | Freq: Every day | ORAL | 0 refills | Status: DC

## 2016-11-28 MED ORDER — FAMOTIDINE 20 MG PO TABS: 20.0000 mg | ORAL_TABLET | Freq: Two times a day (BID) | ORAL | 0 refills | Status: DC

## 2016-11-28 MED ORDER — SODIUM BICARBONATE 650 MG PO TABS
650.0000 mg | ORAL_TABLET | Freq: Two times a day (BID) | ORAL | Status: DC
  Administered 2016-11-28: 650 mg via ORAL
  Filled 2016-11-28: qty 1

## 2016-11-28 MED ORDER — FERROUS SULFATE 325 (65 FE) MG PO TABS: 325.0000 mg | ORAL_TABLET | Freq: Two times a day (BID) | ORAL | 3 refills | Status: DC

## 2016-11-28 NOTE — Discharge Summary (Signed)
Physician Discharge Summary  Briana [name] OZY:[id number] DOB: 11/19/1975 DOA: 11/25/2016  PCP: Scharlene Gloss, MD  Admit date: 11/25/2016 Discharge date: 11/28/2016  Admitted From: Home  Disposition:  Home   Recommendations for Outpatient Follow-up:  1. Follow up with PCP in 1-2 weeks 2. Please obtain BMP/CBC in one week 3. Follow with ID, for further resumption of medications.     Discharge Condition: Stable.  CODE STATUS: Full code.  Diet recommendation: Heart Healthy   Brief/Interim Summary: Briana [name] a 41 y.o.femalewith medical history significant of HIV not onHARRTlast 12 months; who presentwith complaints of fatigue and decreased overall appetite over the last 3 weeks. Associated symptoms include fever up to 103F at home. Denies having any headache, muscle aches, chest pain, congestion, cough, nausea, vomiting, diarrhea, dysuria, vaginal discharge, urinary frequency,or blood in stool/urine. Patient had been previously diagnosed with pyelonephritis on 2/26 and treated with a seven-day course of Keflex which patient noted symptoms had improved   Assessment & Plan:   Principal Problem:   Fever Active Problems:   Human immunodeficiency virus (HIV) disease ([facility])   Thrombocytosis (HCC)   Hypokalemia  1-Lips swelling;  Report swelling of lips after eating a Pie today. Feels her throat  Scratchy.  Will add benadryl PRN, Solumedrol and Pepcid.  Resolved. Discharge on pepcid and PRN benadryl.    2-Sepsis; presume related to UTI.  UA with too numerous to Count WBC.  Urine Culture 10,000 colonies insignificant growth  Renal US negative for pyelonephritis.  Blood culture; no growth to date.  On IV Vancomycin and ceftriaxone. Received 2 days.  plan to discharge on Levaquin.  To resume work April 7 3-Anemia; Microcytic; Iron deficiency. Iron at 6. Denies melena, hematochezia, denies heavy menstrual periods.  Presents with Hb at 6.9.  Received total of 2  units of PRBC. Hb increased to 8.5 Started on ferrous sulfate and B 12 supplements   4-Hypokalemia;  Resolved.   5-HIV;  RPR negative.  HIV quantitative pending.  CD 4 150 ID consulted. Appreciate Dr Megan Salon help. Follow up outpatient.    Discharge Diagnoses:  Principal Problem:   Fever Active Problems:   Human immunodeficiency virus (HIV) disease ([facility])   Thrombocytosis ([facility])   Hypokalemia    Discharge Instructions  Discharge Instructions    Diet - low sodium heart healthy    Complete by:  As directed    Increase activity slowly    Complete by:  As directed      Allergies as of 11/28/2016      Reactions   Pumpkin Flavor    Lip swelling after eating pumpkin pie      Medication List    STOP taking these medications   cephALEXin 500 MG capsule Commonly known as:  KEFLEX   dolutegravir 50 MG tablet Commonly known as:  TIVICAY   HYDROcodone-acetaminophen 5-325 MG tablet Commonly known as:  NORCO   naproxen 375 MG tablet Commonly known as:  NAPROSYN   NORVIR 100 MG Tabs tablet Generic drug:  ritonavir   ondansetron 4 MG tablet Commonly known as:  ZOFRAN   PREZISTA 800 MG tablet Generic drug:  darunavir   TRUVADA 200-300 MG tablet Generic drug:  emtricitabine-tenofovir     TAKE these medications   cyanocobalamin 100 MCG tablet Take 1 tablet (100 mcg total) by mouth daily.   famotidine 20 MG tablet Commonly known as:  PEPCID Take 1 tablet (20 mg total) by mouth 2 (two) times daily.   feeding supplement Liqd  Take 1 Container by mouth 3 (three) times daily between meals.   ferrous sulfate 325 (65 FE) MG tablet Take 1 tablet (325 mg total) by mouth 2 (two) times daily with a meal.   levofloxacin 500 MG tablet Commonly known as:  LEVAQUIN Take 1 tablet (500 mg total) by mouth daily.   loratadine 10 MG tablet Commonly known as:  CLARITIN Take 1 tablet (10 mg total) by mouth daily.   sodium bicarbonate 650 MG tablet Take 1 tablet (650 mg  total) by mouth 2 (two) times daily.      Follow-up Information    [facility] Go on 12/01/2016.   Why:  Post hospital follow up scheduled on 12/01/2016 with Crista Curb @ 2:30pm Contact information: [place] [address] [phone]       Scharlene Gloss, MD Follow up in 1 week(s).   Specialty:  Infectious Diseases Contact information: [address] [place] [address] [phone]          Allergies  Allergen Reactions  . Pumpkin Flavor     Lip swelling after eating pumpkin pie    Consultations:  ID   Procedures/Studies: Dg Chest 2 View  Result Date: 11/25/2016 CLINICAL DATA:  Worsening fatigue and anorexia. Fever, cough and weakness. HIV patient. EXAM: CHEST  2 VIEW COMPARISON:  Radiographs 03/09/2013 FINDINGS: The cardiomediastinal contours are normal. Mild hypoventilation with bronchovascular crowding at the lung bases. Minimal right basilar atelectasis. Pulmonary vasculature is normal. No consolidation, pleural effusion, or pneumothorax. No acute osseous abnormalities are seen. IMPRESSION: Mild hypoventilation with bronchovascular crowding and right basilar atelectasis. Electronically Signed   By: Jeb Levering M.D.   On: 11/25/2016 21:43   Ct Head Wo Contrast  Result Date: 11/25/2016 CLINICAL DATA:  Worsening fatigue.  Anorexia.  HIV positive. EXAM: CT HEAD WITHOUT CONTRAST TECHNIQUE: Contiguous axial images were obtained from the base of the skull through the vertex without intravenous contrast. COMPARISON:  None. FINDINGS: Brain: No evidence of parenchymal hemorrhage or extra-axial fluid collection. No mass lesion, mass effect, or midline shift. No CT evidence of acute infarction. Cerebral volume is age appropriate. No ventriculomegaly. Vascular: No hyperdense vessel or unexpected calcification. Skull: No evidence of calvarial fracture. Sinuses/Orbits: The visualized paranasal sinuses  are essentially clear. Other: Symmetric thickening of the posterior nasopharyngeal soft tissues. The mastoid air cells are unopacified. IMPRESSION: 1.  No evidence of acute intracranial abnormality. 2. Symmetric thickening of the posterior nasopharyngeal soft tissues, compatible with HIV related lymphoid hyperplasia. Electronically Signed   By: Ilona Sorrel M.D.   On: 11/25/2016 21:48   US Renal  Result Date: 11/27/2016 CLINICAL DATA:  Urinary tract infection. EXAM: RENAL / URINARY TRACT ULTRASOUND COMPLETE COMPARISON:  None. FINDINGS: Right Kidney: Length: 12.3 cm. Echogenicity within normal limits. No mass or hydronephrosis visualized. No perinephric fluid collection. Left Kidney: Length: 11.9 cm. Echogenicity within normal limits. Simple cyst in the lower right kidney measures 1.3 cm. No solid mass or hydronephrosis visualized. No perinephric fluid collection. Bladder: Appears normal for degree of bladder distention. Both ureteral jets are visualized. IMPRESSION: Small left renal cyst.  Otherwise unremarkable renal ultrasound. Electronically Signed   By: Jeb Levering M.D.   On: 11/27/2016 03:01       Subjective: No further lip swelling.  Feeling better ,but not at baseline.   Discharge Exam: Vitals:   11/28/16 0633 11/28/16 0653  BP: (!) 168/97 (!) 160/79  Pulse: 61   Resp:  18   Temp: 97.7 F (36.5 C)    Vitals:   11/27/16 0537 11/27/16 1604 11/28/16 0633 11/28/16 0653  BP: 116/88 125/88 (!) 168/97 (!) 160/79  Pulse: 77 86 61   Resp: 18 18 18    Temp: 98.1 F (36.7 C) 99.3 F (37.4 C) 97.7 F (36.5 C)   TempSrc: Oral Oral Oral   SpO2: 100% 98% 100%   Weight:      Height:        General: Pt is alert, awake, not in acute distress Cardiovascular: RRR, S1/S2 +, no rubs, no gallops Respiratory: CTA bilaterally, no wheezing, no rhonchi Abdominal: Soft, NT, ND, bowel sounds + Extremities: no edema, no cyanosis    The results of significant diagnostics from this  hospitalization (including imaging, microbiology, ancillary and laboratory) are listed below for reference.     Microbiology: Recent Results (from the past 240 hour(s))  Blood Culture (routine x 2)     Status: None (Preliminary result)   Collection Time: 11/25/16  7:19 PM  Result Value Ref Range Status   Specimen Description BLOOD LEFT AC  Final   Special Requests BOTTLES DRAWN AEROBIC AND ANAEROBIC 5CC EACH  Final   Culture   Final    NO GROWTH 2 DAYS Performed at [facility], [address] [place], [place] [address]    Report Status PENDING  Incomplete  Blood Culture (routine x 2)     Status: None (Preliminary result)   Collection Time: 11/25/16  7:22 PM  Result Value Ref Range Status   Specimen Description BLOOD RIGHT WRIST  Final   Special Requests BOTTLES DRAWN AEROBIC AND ANAEROBIC 5CC EACH  Final   Culture   Final    NO GROWTH 2 DAYS Performed at [facility], [place] [address] [place], [place] [address]    Report Status PENDING  Incomplete  Culture, Urine     Status: Abnormal   Collection Time: 11/26/16  1:53 PM  Result Value Ref Range Status   Specimen Description URINE, CLEAN CATCH  Final   Special Requests NONE  Final   Culture <10,000 COLONIES/mL INSIGNIFICANT GROWTH (A)  Final   Report Status 11/27/2016 FINAL  Final     Labs: BNP (last 3 results) No results for input(s): BNP in the last 8760 hours. Basic Metabolic Panel:  Recent Labs Lab 11/25/16 1006 11/25/16 1852 11/26/16 0158 11/26/16 0927 11/27/16 0618 11/28/16 0720  NA 129* 129* 136  --  140 138  K 3.8 3.2* 2.8*  --  4.0 4.4  CL 98 98* 104  --  114* 110  CO2 20 21* 20*  --  19* 18*  GLUCOSE 84 96 105*  --  86 108*  BUN 8 8 8   --  <5* 5*  CREATININE 1.04 0.96 1.13*  --  0.84 0.60  CALCIUM 8.9 8.6* 8.2*  --  8.7* 9.1  MG  --   --   --  3.2*  --   --    Liver Function Tests:  Recent Labs Lab 11/25/16 1006 11/26/16 0158  AST 19 20  ALT 7 9*  ALKPHOS 84 71  BILITOT 0.4 0.7   PROT 8.8* 7.6  ALBUMIN 3.3* 2.3*   No results for input(s): LIPASE, AMYLASE in the last 168 hours. No results for input(s): AMMONIA in the last 168 hours. CBC:  Recent Labs Lab 11/25/16 1006 11/25/16 1852 11/26/16 0158 11/27/16 0618  WBC 9.5 10.8* 5.6 6.4  NEUTROABS 7,030 7.6  --   --  HGB 7.1* 6.9* 6.4* 8.5*  HCT 24.4* 23.7* 22.7* 28.9*  MCV 64.4* 65.7* 65.8* 69.8*  PLT 588* 583* 428* 417*   Cardiac Enzymes: No results for input(s): CKTOTAL, CKMB, CKMBINDEX, TROPONINI in the last 168 hours. BNP: Invalid input(s): POCBNP CBG: No results for input(s): GLUCAP in the last 168 hours. D-Dimer No results for input(s): DDIMER in the last 72 hours. Hgb A1c No results for input(s): HGBA1C in the last 72 hours. Lipid Profile No results for input(s): CHOL, HDL, LDLCALC, TRIG, CHOLHDL, LDLDIRECT in the last 72 hours. Thyroid function studies No results for input(s): TSH, T4TOTAL, T3FREE, THYROIDAB in the last 72 hours.  Invalid input(s): FREET3 Anemia work up  Recent Labs  11/26/16 0158  VITAMINB12 299  FOLATE 16.9  FERRITIN 158  TIBC 220*  IRON 6*  RETICCTPCT 1.0   Urinalysis    Component Value Date/Time   COLORURINE YELLOW 11/25/2016 1853   APPEARANCEUR CLOUDY (A) 11/25/2016 1853   LABSPEC 1.008 11/25/2016 1853   PHURINE 6.0 11/25/2016 1853   GLUCOSEU NEGATIVE 11/25/2016 1853   HGBUR NEGATIVE 11/25/2016 1853   BILIRUBINUR NEGATIVE 11/25/2016 1853   KETONESUR NEGATIVE 11/25/2016 1853   PROTEINUR 30 (A) 11/25/2016 1853   UROBILINOGEN 0.2 05/23/2014 1655   NITRITE POSITIVE (A) 11/25/2016 1853   LEUKOCYTESUR LARGE (A) 11/25/2016 1853   Sepsis Labs Invalid input(s): PROCALCITONIN,  WBC,  LACTICIDVEN Microbiology Recent Results (from the past 240 hour(s))  Blood Culture (routine x 2)     Status: None (Preliminary result)   Collection Time: 11/25/16  7:19 PM  Result Value Ref Range Status   Specimen Description BLOOD LEFT AC  Final   Special Requests BOTTLES  DRAWN AEROBIC AND ANAEROBIC 5CC EACH  Final   Culture   Final    NO GROWTH 2 DAYS Performed at [facility], [place] [address], [place], [place] [address]    Report Status PENDING  Incomplete  Blood Culture (routine x 2)     Status: None (Preliminary result)   Collection Time: 11/25/16  7:22 PM  Result Value Ref Range Status   Specimen Description BLOOD RIGHT WRIST  Final   Special Requests BOTTLES DRAWN AEROBIC AND ANAEROBIC 5CC EACH  Final   Culture   Final    NO GROWTH 2 DAYS Performed at [facility], [place] [address] [place], [place] [address]    Report Status PENDING  Incomplete  Culture, Urine     Status: Abnormal   Collection Time: 11/26/16  1:53 PM  Result Value Ref Range Status   Specimen Description URINE, CLEAN CATCH  Final   Special Requests NONE  Final   Culture <10,000 COLONIES/mL INSIGNIFICANT GROWTH (A)  Final   Report Status 11/27/2016 FINAL  Final     Time coordinating discharge: Over 30 minutes  SIGNED:   Elmarie Shiley, MD  [facility] 11/28/2016, 10:04 AM Pager [phone]  If 7PM-7AM, please contact night-coverage [email or web address] Password [email or web address]

## 2016-11-28 NOTE — Progress Notes (Signed)
Patient ID: Briana [name], female   DOB: 1976-02-17, 41 y.o.   MRN: [id number]          [facility]  Date of Admission:  11/25/2016   Total days of antibiotics 3         Principal Problem:   Fever Active Problems:   Human immunodeficiency virus (HIV) disease (HCC)   Thrombocytosis (HCC)   Hypokalemia   . sodium chloride   Intravenous Once  . famotidine (PEPCID) IV  20 mg Intravenous Q12H  . feeding supplement  1 Container Oral TID BM  . ferrous sulfate  325 mg Oral BID WC  . levofloxacin  500 mg Oral Daily  . loratadine  10 mg Oral Daily  . methylPREDNISolone (SOLU-MEDROL) injection  60 mg Intravenous Q12H  . sodium bicarbonate  650 mg Oral BID  . vitamin B-12  100 mcg Oral Daily    SUBJECTIVE: She is feeling much better today and eager to go home. The swelling and itching of her upper lip subsided last evening.  Review of Systems: Review of Systems  Constitutional: Negative for chills, diaphoresis, fever, malaise/fatigue and weight loss.  HENT: Negative for sore throat.   Respiratory: Negative for cough, sputum production and shortness of breath.   Cardiovascular: Negative for chest pain.  Gastrointestinal: Negative for abdominal pain, diarrhea, heartburn, nausea and vomiting.  Genitourinary: Negative for dysuria and frequency.  Musculoskeletal: Negative for joint pain and myalgias.  Skin: Negative for rash.  Neurological: Negative for dizziness and headaches.  Psychiatric/Behavioral: Negative for depression and substance abuse. The patient is not nervous/anxious.     Past Medical History:  Diagnosis Date  . Abnormal Pap smear    s/p colposcopy   . Anemia   . Chronic kidney disease 03/2013   kidney infection  . HIV infection ([place])   . Screening for malignant neoplasm of the cervix     Social History  Substance Use Topics  . Smoking status: Never Smoker  . Smokeless tobacco: Never Used  . Alcohol use No    Family History    Problem Relation Age of Onset  . Eczema Mother   . Diabetes Father   . Hypertension Father   . Diabetes Maternal Grandmother   . Hypertension Maternal Grandmother   . Diabetes Maternal Grandfather   . Hypertension Maternal Grandfather   . Diabetes Paternal Grandmother   . Hypertension Paternal Grandmother   . Diabetes Paternal Grandfather   . Hypertension Paternal Grandfather    Allergies  Allergen Reactions  . Pumpkin Flavor     Lip swelling after eating pumpkin pie    OBJECTIVE: Vitals:   11/27/16 0537 11/27/16 1604 11/28/16 0633 11/28/16 0653  BP: 116/88 125/88 (!) 168/97 (!) 160/79  Pulse: 77 86 61   Resp: 18 18 18    Temp: 98.1 F (36.7 C) 99.3 F (37.4 C) 97.7 F (36.5 C)   TempSrc: Oral Oral Oral   SpO2: 100% 98% 100%   Weight:      Height:       Body mass index is 24.96 kg/m.  Physical Exam  Constitutional: She is oriented to person, place, and time.  She is smiling and in good spirits.  HENT:  Mouth/Throat: No oropharyngeal exudate.  No lip swelling.  Eyes: Conjunctivae are normal.  Cardiovascular: Normal rate and regular rhythm.   No murmur heard. Pulmonary/Chest: Effort normal and breath sounds normal.  Abdominal: Soft. She exhibits no mass. There is no tenderness.  Musculoskeletal:  Normal range of motion.  Neurological: She is alert and oriented to person, place, and time.  Skin: No rash noted.  Psychiatric: Mood and affect normal.    Lab Results Lab Results  Component Value Date   WBC 6.4 11/27/2016   HGB 8.5 (L) 11/27/2016   HCT 28.9 (L) 11/27/2016   MCV 69.8 (L) 11/27/2016   PLT 417 (H) 11/27/2016    Lab Results  Component Value Date   CREATININE 0.60 11/28/2016   BUN 5 (L) 11/28/2016   NA 138 11/28/2016   K 4.4 11/28/2016   CL 110 11/28/2016   CO2 18 (L) 11/28/2016    Lab Results  Component Value Date   ALT 9 (L) 11/26/2016   AST 20 11/26/2016   ALKPHOS 71 11/26/2016   BILITOT 0.7 11/26/2016     Microbiology: Recent  Results (from the past 240 hour(s))  Blood Culture (routine x 2)     Status: None (Preliminary result)   Collection Time: 11/25/16  7:19 PM  Result Value Ref Range Status   Specimen Description BLOOD LEFT AC  Final   Special Requests BOTTLES DRAWN AEROBIC AND ANAEROBIC 5CC EACH  Final   Culture   Final    NO GROWTH 2 DAYS Performed at [facility], [place] [address] [place], [place] [address]    Report Status PENDING  Incomplete  Blood Culture (routine x 2)     Status: None (Preliminary result)   Collection Time: 11/25/16  7:22 PM  Result Value Ref Range Status   Specimen Description BLOOD RIGHT WRIST  Final   Special Requests BOTTLES DRAWN AEROBIC AND ANAEROBIC 5CC EACH  Final   Culture   Final    NO GROWTH 2 DAYS Performed at [facility], [place] [address] [place], [place] [address]    Report Status PENDING  Incomplete  Culture, Urine     Status: Abnormal   Collection Time: 11/26/16  1:53 PM  Result Value Ref Range Status   Specimen Description URINE, CLEAN CATCH  Final   Special Requests NONE  Final   Culture <10,000 COLONIES/mL INSIGNIFICANT GROWTH (A)  Final   Report Status 11/27/2016 FINAL  Final     ASSESSMENT: Because of her recent fever is unclear. She improved with empiric antibiotics and blood transfusion. She will go home today and finish up a short course of levofloxacin. She is scheduled to follow-up in our clinic on 12/09/2016.  PLAN: 1. Agree with discharge on levofloxacin 2. Follow-up in [facility] 12/09/2016  Michel Bickers, [place] [facility] [phone] pager   [phone] cell 11/28/2016, 12:46 PM

## 2016-11-30 LAB — CULTURE, BLOOD (ROUTINE X 2)
CULTURE: NO GROWTH
Culture: NO GROWTH

## 2016-12-01 ENCOUNTER — Inpatient Hospital Stay: Payer: BC Managed Care – PPO

## 2016-12-04 LAB — HIV-1 GENOTYPR PLUS

## 2016-12-09 ENCOUNTER — Encounter: Payer: Self-pay | Admitting: Internal Medicine

## 2016-12-09 ENCOUNTER — Other Ambulatory Visit: Payer: Self-pay | Admitting: Pharmacist Clinician (PhC)/ Clinical Pharmacy Specialist

## 2016-12-09 ENCOUNTER — Ambulatory Visit (INDEPENDENT_AMBULATORY_CARE_PROVIDER_SITE_OTHER): Payer: BC Managed Care – PPO | Admitting: Internal Medicine

## 2016-12-09 VITALS — BP 123/88 | HR 108 | Temp 98.5°F | Ht 64.5 in | Wt 155.0 lb

## 2016-12-09 DIAGNOSIS — B20 Human immunodeficiency virus [HIV] disease: Secondary | ICD-10-CM

## 2016-12-09 DIAGNOSIS — R87613 High grade squamous intraepithelial lesion on cytologic smear of cervix (HGSIL): Secondary | ICD-10-CM | POA: Diagnosis not present

## 2016-12-09 DIAGNOSIS — D508 Other iron deficiency anemias: Secondary | ICD-10-CM | POA: Diagnosis not present

## 2016-12-09 MED ORDER — ELVITEG-COBIC-EMTRICIT-TENOFAF 150-150-200-10 MG PO TABS: 1.0000 | ORAL_TABLET | Freq: Every day | ORAL | 5 refills | Status: DC

## 2016-12-09 NOTE — Progress Notes (Signed)
   Subjective:    Patient ID: Briana [name], female    DOB: 06/26/1976, 41 y.o.   MRN: [id number]  HPI Here for follow up after a long absence.   I last saw her in early 2016 and had been on Prezista, norvir and Truvada and had added Tivicay for intensification at the time, though no mutations noted.  She has been off of ARVs for about 2 years and recently hospitalized for fever and UTI.  She completed levaquin and now has no further fever, no dysyuria, pyuria or n/v.  She is interested in getting back on treatment.  She recently lost 2 family members and had been living in [place].  No weight loss, no associated n/v/d.  CD4 150 and viral load 90,000. Genotype wild type.    Review of Systems  Constitutional: Positive for fatigue.  Gastrointestinal: Negative for diarrhea and nausea.  Skin: Negative for rash.  Neurological: Negative for dizziness and headaches.       Objective:   Physical Exam  Constitutional: She appears well-developed and well-nourished. No distress.  Eyes: No scleral icterus.  Cardiovascular: Normal rate, regular rhythm and normal heart sounds.   No murmur heard. Pulmonary/Chest: Effort normal and breath sounds normal. No respiratory distress.  Lymphadenopathy:    She has no cervical adenopathy.  Skin: No rash noted.   SH: works in [place]:

## 2016-12-09 NOTE — Assessment & Plan Note (Signed)
Will get her scheduled back with GYN next visit.

## 2016-12-09 NOTE — Assessment & Plan Note (Signed)
Ok to take iron with ARVs

## 2016-12-09 NOTE — Progress Notes (Signed)
Briana [name] has to be transferred to [facility]

## 2016-12-09 NOTE — Assessment & Plan Note (Addendum)
Will rstart ARVS with [facility].  I do not think she needs Prezista as well since no resistance mutations noted but did require intensification in the past.  I really suspect this was non-compliance.   I discussed the severity of having uncontrolled HIV and CD4 under 200.  She will fu with PharmD in 4 weeks for labs including cmp, cbc, CD4, viral load, lipid panel, rpr and urine cytology, which she is due for.  I will follow up with her in 4 months with labs prior.   I counseled her on compliance and need to stay in care.

## 2016-12-10 ENCOUNTER — Other Ambulatory Visit: Payer: Self-pay | Admitting: *Deleted

## 2016-12-10 DIAGNOSIS — B20 Human immunodeficiency virus [HIV] disease: Secondary | ICD-10-CM

## 2016-12-10 MED ORDER — ELVITEG-COBIC-EMTRICIT-TENOFAF 150-150-200-10 MG PO TABS: 1.0000 | ORAL_TABLET | Freq: Every day | ORAL | 5 refills | Status: DC

## 2016-12-12 ENCOUNTER — Other Ambulatory Visit: Payer: Self-pay | Admitting: *Deleted

## 2016-12-12 DIAGNOSIS — B20 Human immunodeficiency virus [HIV] disease: Secondary | ICD-10-CM

## 2016-12-12 MED ORDER — ELVITEG-COBIC-EMTRICIT-TENOFAF 150-150-200-10 MG PO TABS: 1.0000 | ORAL_TABLET | Freq: Every day | ORAL | 5 refills | Status: DC

## 2016-12-15 ENCOUNTER — Other Ambulatory Visit: Payer: Self-pay | Admitting: *Deleted

## 2016-12-15 NOTE — Progress Notes (Signed)
[facility] [place]. [place] calling to confirm allergies, medication list for patient.  Done.  They have been unable to reach her for this information. Landis Gandy, RN

## 2016-12-19 ENCOUNTER — Ambulatory Visit: Payer: BC Managed Care – PPO | Admitting: Family Medicine

## 2017-01-06 ENCOUNTER — Ambulatory Visit: Payer: BC Managed Care – PPO

## 2017-01-06 ENCOUNTER — Telehealth: Payer: Self-pay

## 2017-01-06 NOTE — Telephone Encounter (Signed)
Called patient since she did not show for her pharmacy visit today. I left a voicemail for her to call [facility] and reschedule her pharmacy visit.  Dimitri Ped, PharmD, BCPS PGY-2 Infectious Diseases Pharmacy Resident Pager: [phone] 01/06/2017, 12:00 PM

## 2017-01-27 ENCOUNTER — Encounter (HOSPITAL_BASED_OUTPATIENT_CLINIC_OR_DEPARTMENT_OTHER): Payer: Self-pay

## 2017-01-27 ENCOUNTER — Emergency Department (HOSPITAL_BASED_OUTPATIENT_CLINIC_OR_DEPARTMENT_OTHER)
Admission: EM | Admit: 2017-01-27 | Discharge: 2017-01-27 | Disposition: A | Discharge status: Home / Self Care | Payer: BC Managed Care – PPO | Attending: Emergency Medicine | Admitting: Emergency Medicine

## 2017-01-27 DIAGNOSIS — L02811 Cutaneous abscess of head [any part, except face]: Secondary | ICD-10-CM | POA: Insufficient documentation

## 2017-01-27 DIAGNOSIS — N3090 Cystitis, unspecified without hematuria: Secondary | ICD-10-CM | POA: Diagnosis present

## 2017-01-27 MED ORDER — LIDOCAINE HCL (PF) 1 % IJ SOLN
2.0000 mL | Freq: Once | INTRAMUSCULAR | Status: AC
  Administered 2017-01-27: 2 mL
  Filled 2017-01-27: qty 5

## 2017-01-27 MED ORDER — TERBINAFINE HCL 250 MG PO TABS: 250.0000 mg | ORAL_TABLET | Freq: Every day | ORAL | 0 refills | Status: DC

## 2017-01-27 MED ORDER — LIDOCAINE HCL (PF) 1 % IJ SOLN: 5.0000 mL | Freq: Once | INTRAMUSCULAR | Status: DC

## 2017-01-27 MED ORDER — DOXYCYCLINE HYCLATE 100 MG PO CAPS: 100.0000 mg | ORAL_CAPSULE | Freq: Two times a day (BID) | ORAL | 0 refills | Status: DC

## 2017-01-27 MED ORDER — PENTAFLUOROPROP-TETRAFLUOROETH EX AERO
INHALATION_SPRAY | CUTANEOUS | Status: AC
  Administered 2017-01-27: 21:00:00 via TOPICAL

## 2017-01-27 MED ORDER — PENTAFLUOROPROP-TETRAFLUOROETH EX AERO
INHALATION_SPRAY | CUTANEOUS | Status: AC
  Filled 2017-01-27: qty 30

## 2017-01-27 MED ORDER — PENTAFLUOROPROP-TETRAFLUOROETH EX AERO: INHALATION_SPRAY | Freq: Once | CUTANEOUS | Status: DC

## 2017-01-27 NOTE — ED Provider Notes (Signed)
[facility] DEPT [facility] Provider Note   CSN: [id number] Arrival date & time: 01/27/17  1903   By signing my name below, I, Briana [name], attest that this documentation has been prepared under the direction and in the presence of [name], NP. Electronically Signed: Neta [name], ED Scribe. 01/27/2017. 7:28 PM.   History   Chief Complaint Chief Complaint  Patient presents with  . Cyst   The history is provided by the patient. No language interpreter was used.   HPI Comments:  Briana [name] is a 41 y.o. female with PMHx of HIV who presents to the Emergency Department complaining of a moderate, gradually worsening area of pain and swelling to the scalp x 2 days. Pt states that the area in not painful or itchy. Pt denies drainage from the area. No alleviating factors noted. Pt takes Genvoya regularly. Pt denies fever, headaches, visual changes.   Past Medical History:  Diagnosis Date  . Abnormal Pap smear    s/p colposcopy   . Anemia   . Chronic kidney disease 03/2013   kidney infection  . HIV infection ([place])   . Screening for malignant neoplasm of the cervix     Patient Active Problem List   Diagnosis Date Noted  . Thrombocytosis ([place]) 11/26/2016  . Eczema 09/11/2011  . 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 Iron deficiency anemia 01/23/2011  . Human immunodeficiency virus (HIV) disease ([place]) 10/19/2006    Past Surgical History:  Procedure Laterality Date  . CESAREAN SECTION     2000/2008  . COSMETIC SURGERY     2 previous c-sections  . TUBAL LIGATION      OB History    Gravida Para Term Preterm AB Living   3 3 3     4    SAB TAB Ectopic Multiple Live Births         1 3      Obstetric Comments   dsf       Home Medications    Prior to Admission medications   Medication Sig Start Date End Date Taking? Authorizing Provider  elvitegravir-cobicistat-emtricitabine-tenofovir (GENVOYA) 150-150-200-10 MG TABS tablet Take 1 tablet by mouth  daily. 12/12/16   Thayer Headings, MD  famotidine (PEPCID) 20 MG tablet  11/28/16   [provider]  ferrous sulfate 325 (65 FE) MG tablet Take 1 tablet (325 mg total) by mouth 2 (two) times daily with a meal. 11/28/16   Regalado, Belkys A, MD  vitamin B-12 100 MCG tablet Take 1 tablet (100 mcg total) by mouth daily. 11/28/16   Regalado, Cassie Freer, MD    Family History Family History  Problem Relation Age of Onset  . Eczema Mother   . Diabetes Father   . Hypertension Father   . Diabetes Maternal Grandmother   . Hypertension Maternal Grandmother   . Diabetes Maternal Grandfather   . Hypertension Maternal Grandfather   . Diabetes Paternal Grandmother   . Hypertension Paternal Grandmother   . Diabetes Paternal Grandfather   . Hypertension Paternal Grandfather     Social History Social History  Substance Use Topics  . Smoking status: Never Smoker  . Smokeless tobacco: Never Used  . Alcohol use No     Allergies   Pumpkin flavor   Review of Systems Review of Systems  Constitutional: Negative for fever.  Eyes: Negative for visual disturbance.  Skin:       +area of swelling  Neurological: Negative for headaches.  All other  systems reviewed and are negative.    Physical Exam Updated Vital Signs BP (!) 142/105 (BP Location: Left Arm)   Pulse 79   Temp 98.5 F (36.9 C) (Oral)   Resp 18   Ht 5\' 6"  (1.676 m)   Wt 164 lb (74.4 kg)   LMP 01/20/2017   SpO2 100%   BMI 26.47 kg/m   Physical Exam  Constitutional: She appears well-developed and well-nourished. No distress.  HENT:  Head: Normocephalic and atraumatic.  Eyes: Conjunctivae are normal.  Neck: Neck supple.  Cardiovascular: Normal rate.   Pulmonary/Chest: Effort normal.  Abdominal: Soft. She exhibits no distension.  Musculoskeletal: She exhibits no edema or tenderness.  Neurological: She is alert.  Skin: Skin is warm and dry.  3.5x4cm area of fluctuance to right parietal area adjacent to midline.     Psychiatric: She has a normal mood and affect.  Nursing note and vitals reviewed.    ED Treatments / Results  DIAGNOSTIC STUDIES:  Oxygen Saturation is 100% on RA, normal by my interpretation.    COORDINATION OF CARE:  7:23 PM Discussed treatment plan with pt at bedside and pt agreed to plan.   Labs (all labs ordered are listed, but only abnormal results are displayed) Labs Reviewed - No data to display  EKG  EKG Interpretation None       Radiology No results found.  Procedures .Marland KitchenIncision and Drainage Date/Time: 01/27/2017 9:55 PM Performed by: Jola Schmidt Authorized by: Jola Schmidt   Consent:    Consent obtained:  Verbal   Consent given by:  Patient Location:    Type:  Abscess   Location:  Head   Head location:  Scalp Procedure details:    Drainage:  Purulent   Drainage amount:  Copious   Packing materials:  None   (including critical care time)  Medications Ordered in ED Medications - No data to display   Initial Impression / Assessment and Plan / ED Course  I have reviewed the triage vital signs and the nursing notes.  Pertinent labs & imaging results that were available during my care of the patient were reviewed by me and considered in my medical decision making (see chart for details).    Patient discussed with and seen by Dr. Venora Maples.   Patient with skin abscess. Incision and drainage performed in the ED today.  Abscess was not large enough to warrant packing or drain placement. Wound recheck with her ID provider. Supportive care and return precautions discussed.  Pt sent home with doxycycline. The patient appears reasonably screened and/or stabilized for discharge and I doubt any other emergent medical condition requiring further screening, evaluation, or treatment in the ED prior to discharge.   Final Clinical Impressions(s) / ED Diagnoses   Final diagnoses:  Scalp abscess    New Prescriptions Discharge Medication List as of 01/27/2017   9:49 PM    START taking these medications   Details  doxycycline (VIBRAMYCIN) 100 MG capsule Take 1 capsule (100 mg total) by mouth 2 (two) times daily. One po bid x 7 days, Starting Tue 01/27/2017, Print        I personally performed the services described in this documentation, which was scribed in my presence. The recorded information has been reviewed and is accurate.     [name], NP 01/27/17 [place]    Jola Schmidt, MD 01/27/17 [phone]

## 2017-01-27 NOTE — ED Notes (Signed)
ED Provider at bedside performing I&D  

## 2017-01-27 NOTE — ED Triage Notes (Signed)
C/o cyst like area to top of head x 2 days-NAD-steady gait

## 2017-01-30 LAB — AEROBIC CULTURE W GRAM STAIN (SUPERFICIAL SPECIMEN): Gram Stain: NONE SEEN

## 2017-01-30 LAB — AEROBIC CULTURE  (SUPERFICIAL SPECIMEN)

## 2017-01-31 ENCOUNTER — Telehealth: Payer: Self-pay

## 2017-01-31 NOTE — Telephone Encounter (Signed)
Post ED Visit - Positive Culture Follow-up  Culture report reviewed by antimicrobial stewardship pharmacist:  []  Elenor Quinones, Pharm.D. []  Heide Guile, Pharm.D., BCPS AQ-ID []  Parks Neptune, Pharm.D., BCPS []  Alycia Rossetti, Pharm.D., BCPS []  [place], Pharm.D., BCPS, AAHIVP []  Legrand Como, Pharm.D., BCPS, AAHIVP []  Salome Arnt, PharmD, BCPS [x]  Dimitri Ped, PharmD, BCPS []  Vincenza Hews, PharmD, BCPS  Positive aerobic culture Treated with Doxycycline, organism sensitive to the same and no further patient follow-up is required at this time.  Genia Del 01/31/2017, 9:18 AM

## 2017-02-23 ENCOUNTER — Other Ambulatory Visit: Payer: Self-pay | Admitting: *Deleted

## 2017-02-23 DIAGNOSIS — B20 Human immunodeficiency virus [HIV] disease: Secondary | ICD-10-CM

## 2017-04-29 ENCOUNTER — Telehealth: Payer: Self-pay | Admitting: *Deleted

## 2017-04-29 NOTE — Telephone Encounter (Signed)
RN contacted the patient as an attempt to connect/engage. RN left a message stating that I wanted to be sure all is well and to please let me know if I can assist in any way. Asked for a return call at this time

## 2017-05-07 ENCOUNTER — Telehealth: Payer: Self-pay | Admitting: *Deleted

## 2017-05-07 NOTE — Telephone Encounter (Signed)
Contacted [facility] and patient's Jorje Guild was sent out on Aug 30th and received on Aug 31st. Purpose of the call was to see if the patient is filling her refills despite having missed visits with [place] and it appears that she is filling her refills.   Contacted number listed by VM and sent text message asking for a return call. During her last visit in March VL was over [phone] would love to offer assistance with medical management.

## 2017-07-13 ENCOUNTER — Telehealth: Payer: Self-pay | Admitting: *Deleted

## 2017-07-13 NOTE — Telephone Encounter (Signed)
Referral received during Viral load suppression meeting. Order is for RN to begin attempts to engage with the patient and offer services. Focus should be on addressing the patient's barrier to care and medication adherence. Contacted Briana [name] without any answer, VM left at this time asking for a return call or text. Stated that we have not seen you in a while and would love to schedule a visit with her

## 2017-07-14 ENCOUNTER — Telehealth: Payer: Self-pay | Admitting: *Deleted

## 2017-07-14 NOTE — Telephone Encounter (Signed)
[facility] [place], [place] for patient's Genvoya. Please advise. Landis Gandy, RN

## 2017-07-15 NOTE — Telephone Encounter (Signed)
I agree, no refills, needs to come back for an appointment.

## 2017-07-15 NOTE — Telephone Encounter (Signed)
I'll let Dr. Linus Salmons make the call on this. Her adherence has been terrible. She probably needs to come back before refills.

## 2017-08-19 ENCOUNTER — Other Ambulatory Visit: Payer: Self-pay | Admitting: Internal Medicine

## 2018-05-24 IMAGING — DX DG CHEST 2V
2 series · 2 of 2 positions shown · non-contrast
Comparison: Radiographs 03/09/2013

CLINICAL DATA: Worsening fatigue and anorexia. Fever, cough and
weakness. HIV patient.

EXAM:
CHEST  2 VIEW

[chest pa]
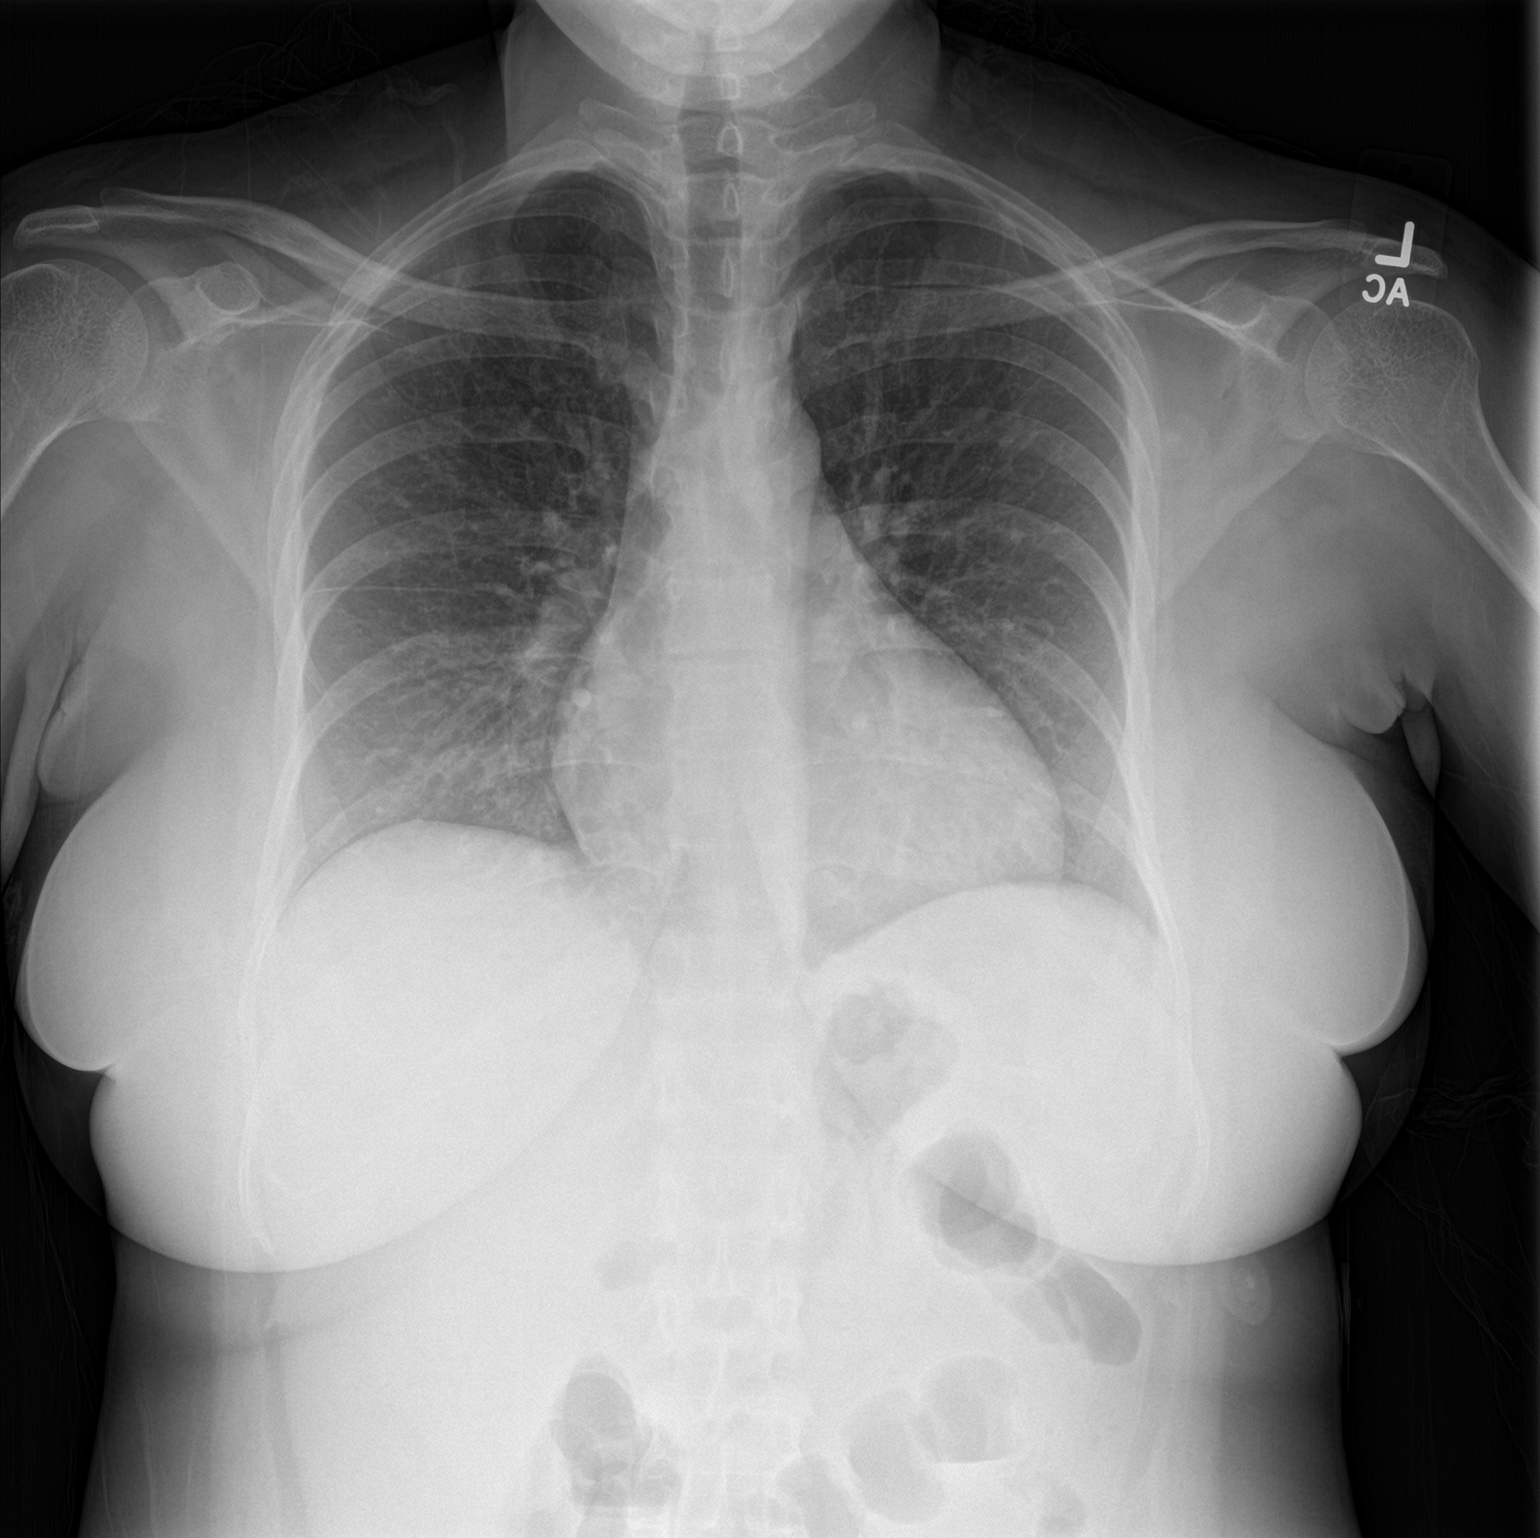

[chest lat]
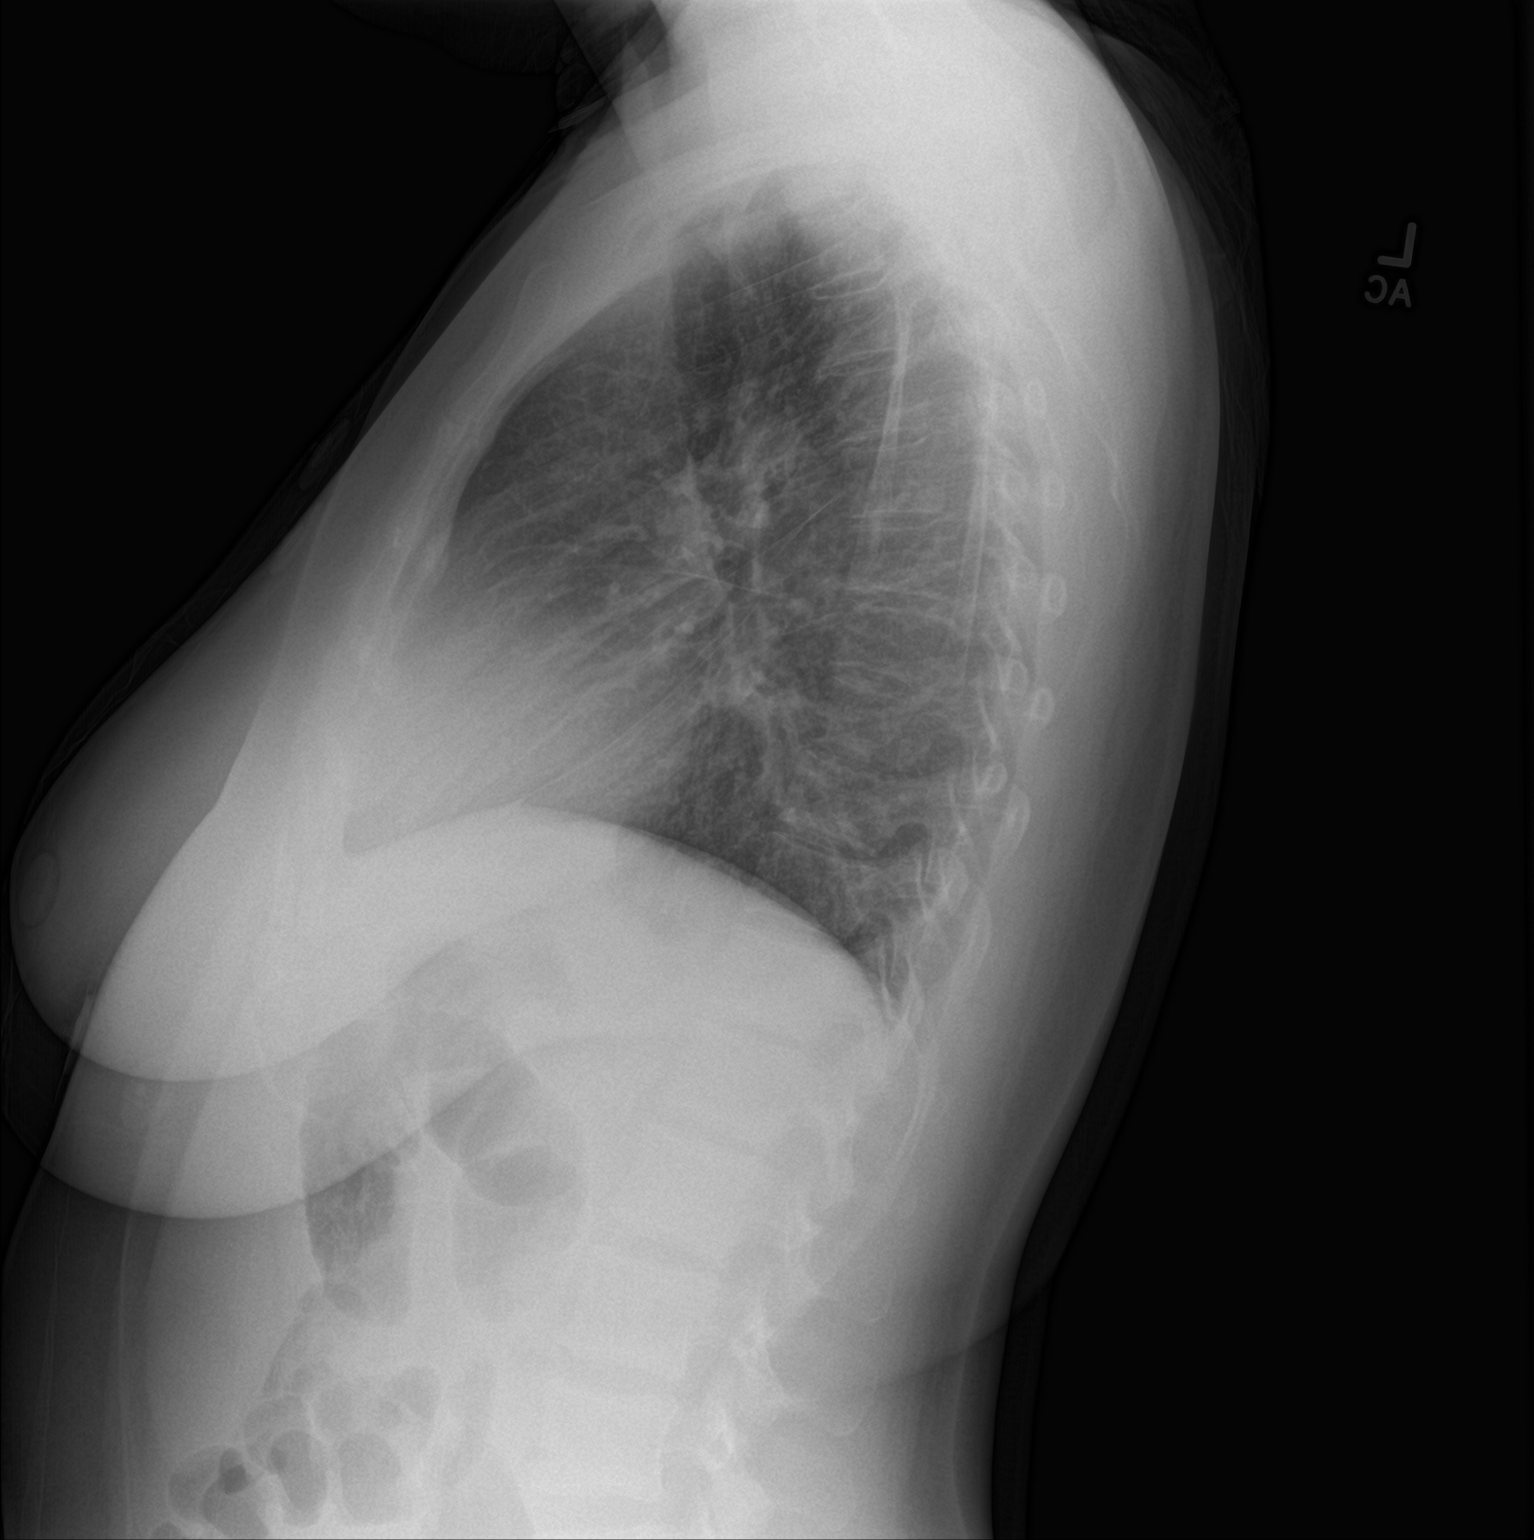

[2 of 2 positions shown; findings below may reference images not displayed]

FINDINGS: The cardiomediastinal contours are normal. Mild hypoventilation with
bronchovascular crowding at the lung bases. Minimal right basilar
atelectasis. Pulmonary vasculature is normal. No consolidation,
pleural effusion, or pneumothorax. No acute osseous abnormalities
are seen.
IMPRESSION: Mild hypoventilation with bronchovascular crowding and right basilar
atelectasis.

## 2018-05-24 IMAGING — CT CT HEAD W/O CM
3 series · 15 of 47 positions shown, 18 images · non-contrast
Comparison: None.

CLINICAL DATA: Worsening fatigue.  Anorexia.  HIV positive.

EXAM:
CT HEAD WITHOUT CONTRAST
TECHNIQUE: Contiguous axial images were obtained from the base of the skull
through the vertex without intravenous contrast.

[Series 3: head wo · axial · 0.44mm/px · z∈[-211,-66]mm · 9 of 35 slices shown, 12 images]
[im 3/35  brain]
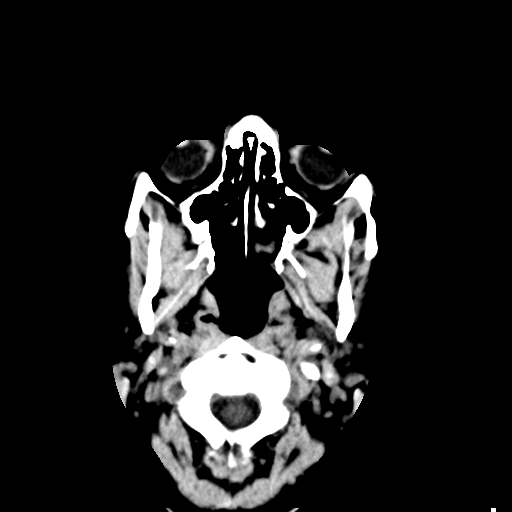
[im 3/35  bone]
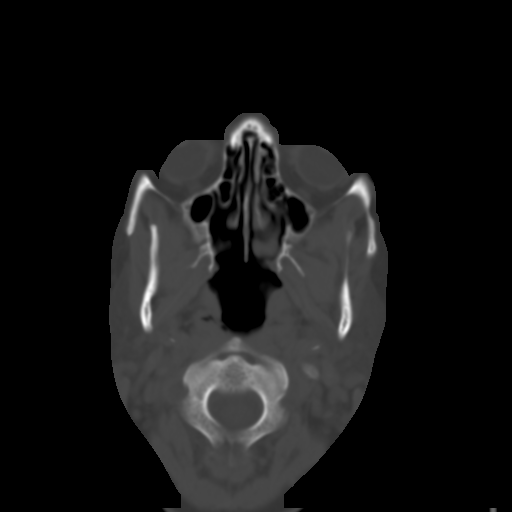
[im 6/35  brain]
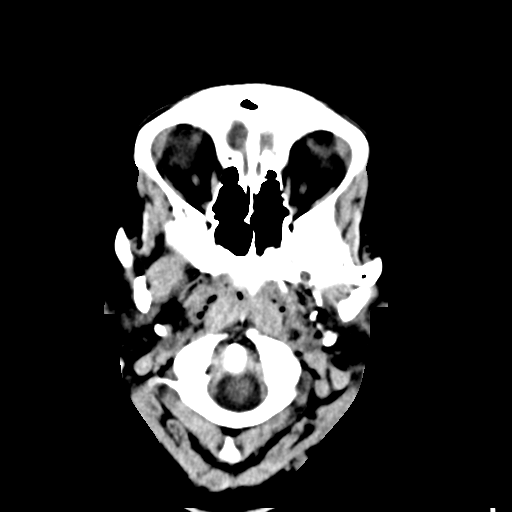
[im 10/35  brain]
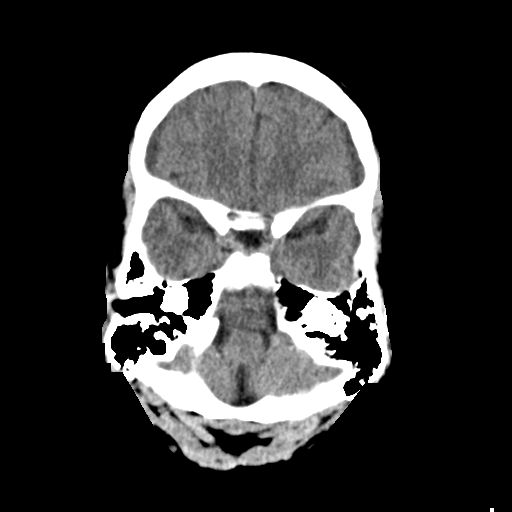
[im 13/35  brain]
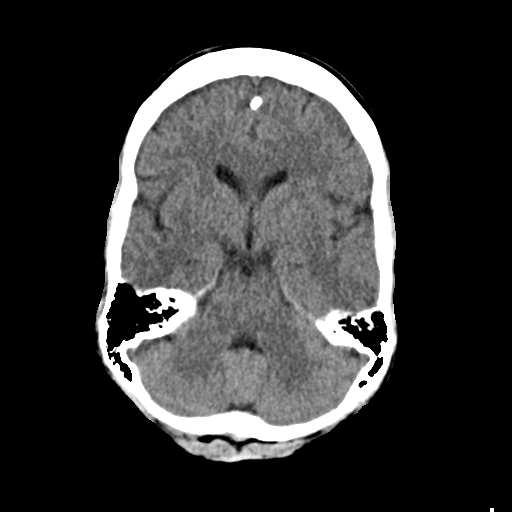
[im 18/35  brain]
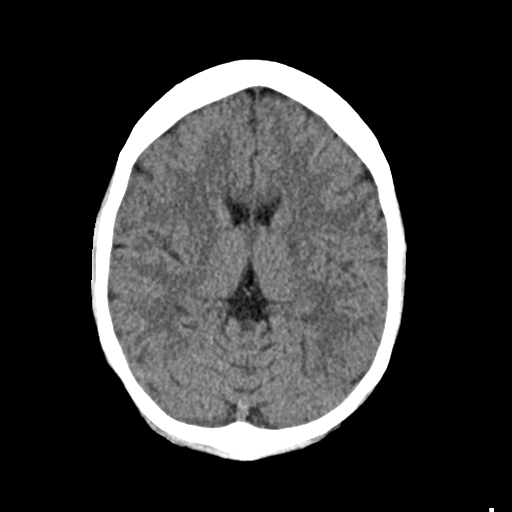
[im 18/35  bone]
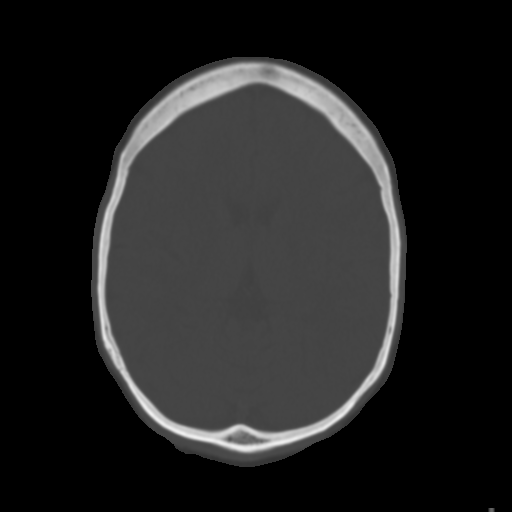
[im 22/35  brain]
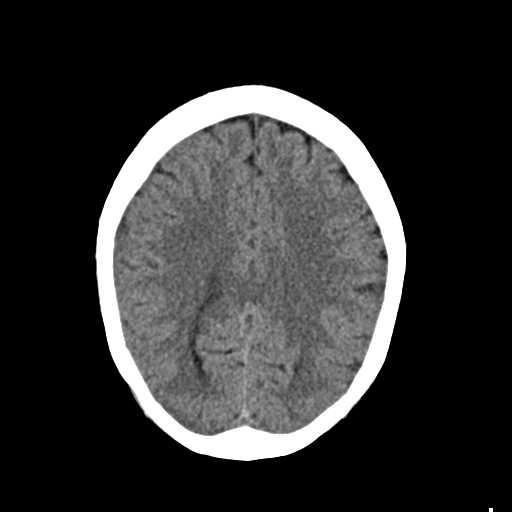
[im 25/35  brain]
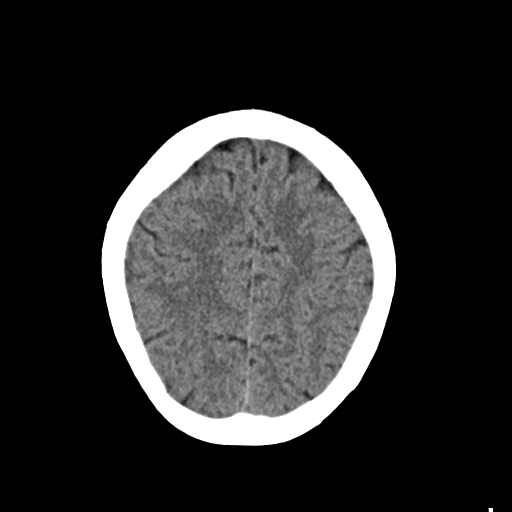
[im 29/35  brain]
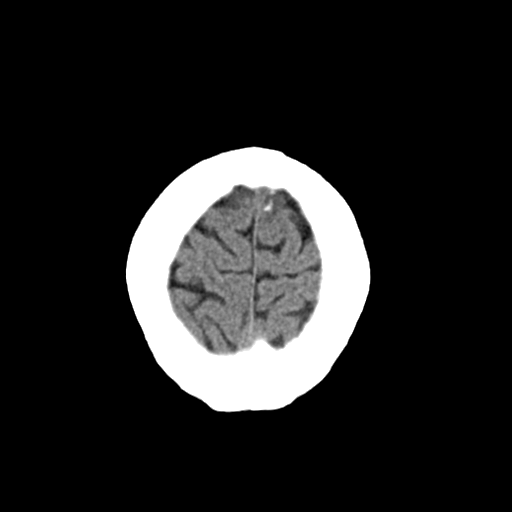
[im 32/35  brain]
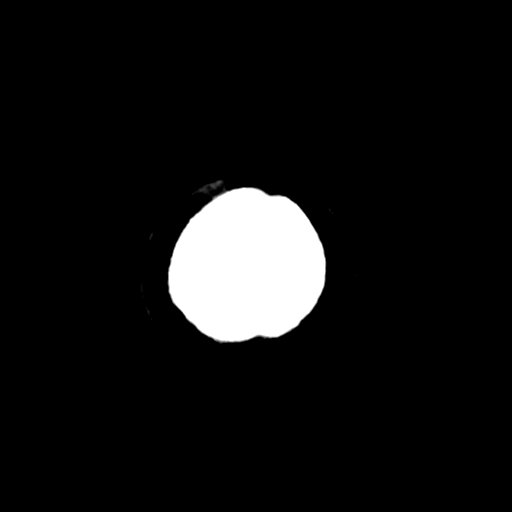
[im 32/35  bone]
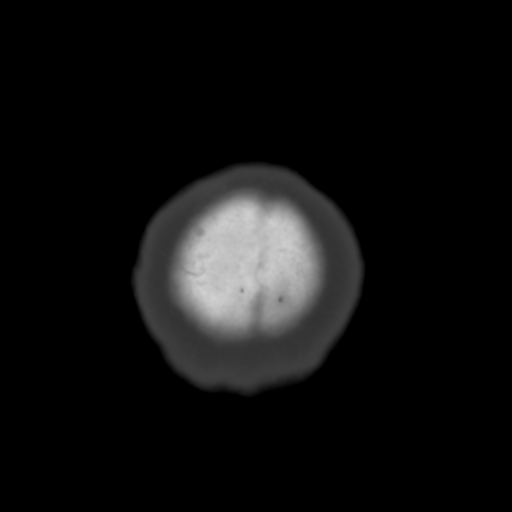

[Series 5: coronal soft · coronal · 0.32mm/px · 3 of 70 slices shown]
[im 24/70  brain]
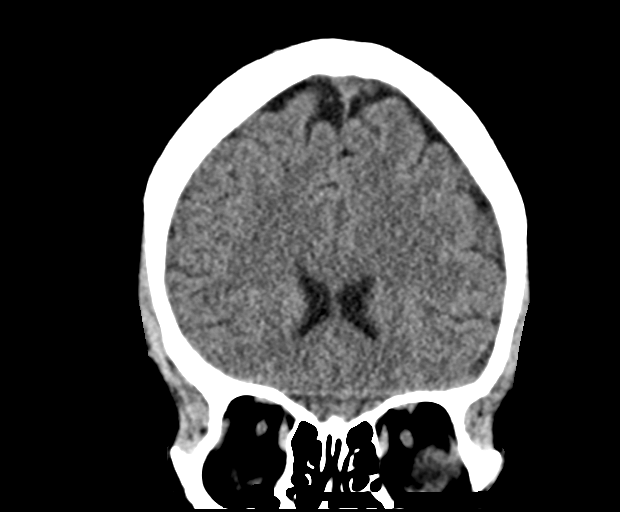
[im 31/70  brain]
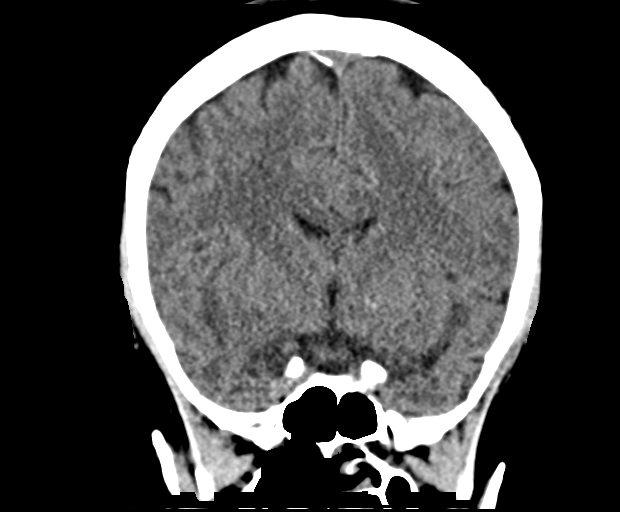
[im 39/70  brain]
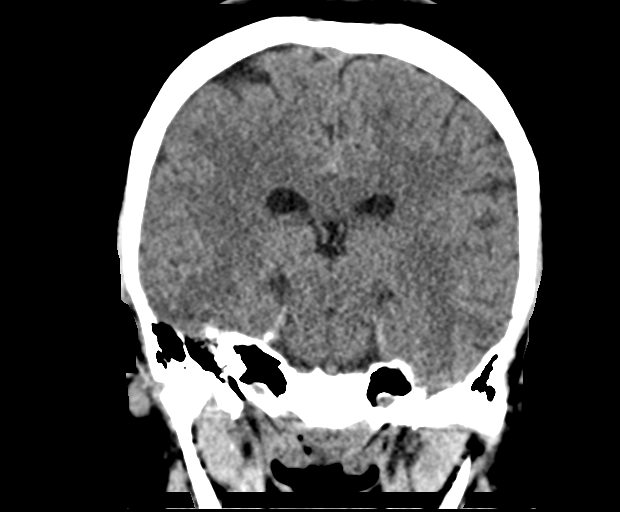

[Series 6: sag soft · sagittal · 0.35mm/px · 3 of 53 slices shown]
[im 18/53  brain]
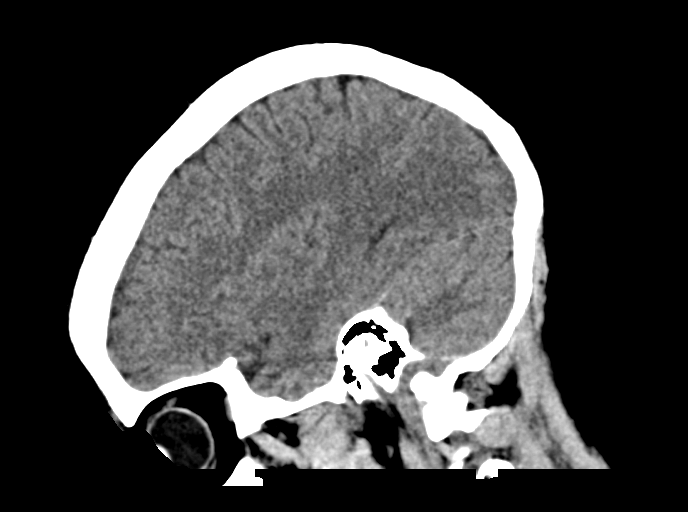
[im 27/53  brain]
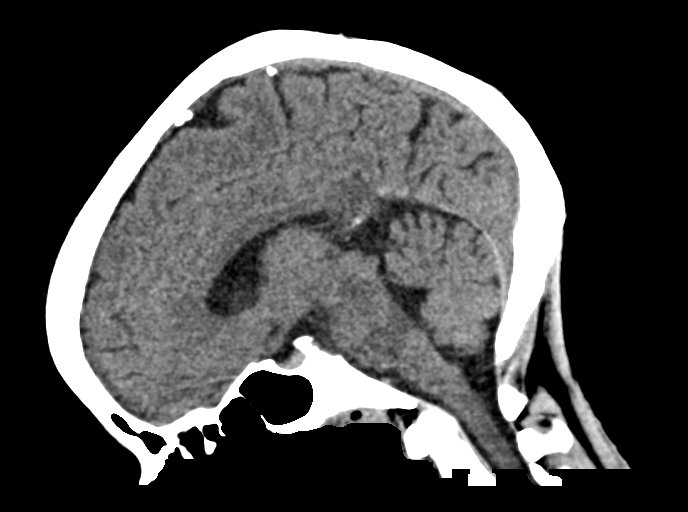
[im 35/53  brain]
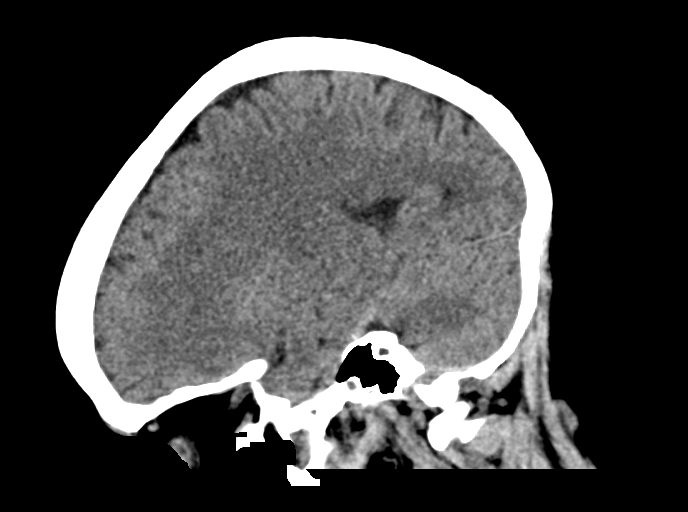

[15 of 47 positions shown; findings below may reference images not displayed]

FINDINGS: Brain: No evidence of parenchymal hemorrhage or extra-axial fluid
collection. No mass lesion, mass effect, or midline shift. No CT
evidence of acute infarction. Cerebral volume is age appropriate. No
ventriculomegaly.

Vascular: No hyperdense vessel or unexpected calcification.

Skull: No evidence of calvarial fracture.

Sinuses/Orbits: The visualized paranasal sinuses are essentially
clear.

Other: Symmetric thickening of the posterior nasopharyngeal soft
tissues. The mastoid air cells are unopacified.
IMPRESSION: 1.  No evidence of acute intracranial abnormality.
2. Symmetric thickening of the posterior nasopharyngeal soft
tissues, compatible with HIV related lymphoid hyperplasia.

## 2018-08-06 ENCOUNTER — Telehealth: Payer: Self-pay

## 2018-08-06 NOTE — Telephone Encounter (Signed)
Patient information came up on overdue pap list. Patient has not been in office since 11/2016, and must schedule an appointment with lab and Dr. Linus Salmons or Pharmacy. Patient can schedule a pap smear with [name], Np if she does not have regular annual pap smear. Unable to reach patient at this time to schedule appointments. Left voicemail asking for patient to call office back. [place]

## 2018-09-11 ENCOUNTER — Other Ambulatory Visit: Payer: Self-pay

## 2018-09-11 ENCOUNTER — Emergency Department (HOSPITAL_BASED_OUTPATIENT_CLINIC_OR_DEPARTMENT_OTHER)
Admission: EM | Admit: 2018-09-11 | Discharge: 2018-09-11 | Disposition: A | Discharge status: Home / Self Care | Payer: BLUE CROSS/BLUE SHIELD | Attending: Emergency Medicine | Admitting: Emergency Medicine

## 2018-09-11 ENCOUNTER — Encounter (HOSPITAL_BASED_OUTPATIENT_CLINIC_OR_DEPARTMENT_OTHER): Payer: Self-pay | Admitting: *Deleted

## 2018-09-11 DIAGNOSIS — Z79899 Other long term (current) drug therapy: Secondary | ICD-10-CM | POA: Diagnosis not present

## 2018-09-11 DIAGNOSIS — R220 Localized swelling, mass and lump, head: Secondary | ICD-10-CM

## 2018-09-11 DIAGNOSIS — L0100 Impetigo, unspecified: Secondary | ICD-10-CM

## 2018-09-11 DIAGNOSIS — N189 Chronic kidney disease, unspecified: Secondary | ICD-10-CM | POA: Diagnosis not present

## 2018-09-11 MED ORDER — MUPIROCIN 2 % EX OINT: 1.0000 "application " | TOPICAL_OINTMENT | Freq: Three times a day (TID) | CUTANEOUS | 0 refills | Status: AC

## 2018-09-11 MED ORDER — DOXYCYCLINE HYCLATE 100 MG PO CAPS: 100.0000 mg | ORAL_CAPSULE | Freq: Two times a day (BID) | ORAL | 0 refills | Status: DC

## 2018-09-11 NOTE — Discharge Instructions (Addendum)
It appears that you have impetigo, which is a bacterial rash on your face. Please take doxycyline 100 mg twice daily for 7 days to help clear this up. We have also given you a topical antibiotic, mupirocin, to use on the rash itself.  Please apply this to the rash 3 times per day for 5 days.   If you facial swelling or rash worsens, you develop pain, or you develop fevers it is VERY important that you return to the emergency room.   It is also important that you follow up with your infectious disease doctor regarding your HIV.

## 2018-09-11 NOTE — ED Triage Notes (Signed)
Pt reports swelling to face and blistered rash since yesterday

## 2018-09-11 NOTE — ED Provider Notes (Signed)
[facility] EMERGENCY DEPARTMENT Provider Note   CSN: [id number] Arrival date & time: 09/11/18  1933   History   Chief Complaint Chief Complaint  Patient presents with  . Facial Swelling    HPI Briana [name] is a 43 y.o. female with PMH HIV, CKD.  Patient reports that she has developed facial swelling with rash for 1 day.  States that on Tuesday she had a fever with cold-like symptoms and was also tased at work.  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  States that after she had TheraFlu and Tylenol she started feeling better and was symptom-free until today when she woke up with facial swelling and this rash on her lip and nose.  Patient denies any new medications or change in medication.  Patient denies any use of ACE inhibitors or arb therapy.  Patient does not think that she has had any new foods.  Did not come into contact with any pumpkins which previously has called this lip swelling.  Patient denies any trouble breathing, no shortness of breath, no chest pain.  Patient denies any eye pain or blurry vision.  Does report that her throat has been scratchy since Tuesday but is having no trouble swallowing.  Denies any fevers or chills.  The history is provided by the patient.    Past Medical History:  Diagnosis Date  . Abnormal Pap smear    s/p colposcopy   . Anemia   . Chronic kidney disease 03/2013   kidney infection  . HIV infection ([place])   . Screening for malignant neoplasm of the cervix     Patient Active Problem List   Diagnosis Date Noted  . Thrombocytosis ([place]) 11/26/2016  . Eczema 09/11/2011  . 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 Iron deficiency anemia 01/23/2011  . Human immunodeficiency virus (HIV) disease ([place]) 10/19/2006    Past Surgical History:  Procedure Laterality Date  . CESAREAN SECTION     2000/2008  . COSMETIC SURGERY     2 previous c-sections  . TUBAL LIGATION       OB History    Gravida  3   Para  3   Term  3   Preterm      AB      Living  4     SAB      TAB      Ectopic      Multiple  1   Live Births  3        Obstetric Comments  dsf         Home Medications    Prior to Admission medications   Medication Sig Start Date End Date Taking? Authorizing Provider  doxycycline (VIBRAMYCIN) 100 MG capsule Take 1 capsule (100 mg total) by mouth 2 (two) times daily. One po bid x 7 days 09/11/18   Kimbly Eanes, [place], DO  elvitegravir-cobicistat-emtricitabine-tenofovir (GENVOYA) 150-150-200-10 MG TABS tablet Take 1 tablet by mouth daily. 12/12/16   Thayer Headings, MD  famotidine (PEPCID) 20 MG tablet  11/28/16   [provider]  ferrous sulfate 325 (65 FE) MG tablet Take 1 tablet (325 mg total) by mouth 2 (two) times daily with a meal. 11/28/16   Regalado, Belkys A, MD  GENVOYA 150-150-200-10 MG TABS tablet TAKE 1 TABLET BY MOUTH ONCE DAILY. 08/20/17   ComerOkey Regal, MD  mupirocin ointment (BACTROBAN) 2 % Place 1 application into the nose 3 (three) times daily for 5  days. 09/11/18 09/16/18  Maribelle Hopple, [place], DO  vitamin B-12 100 MCG tablet Take 1 tablet (100 mcg total) by mouth daily. 11/28/16   Regalado, Cassie Freer, MD    Family History Family History  Problem Relation Age of Onset  . Eczema Mother   . Diabetes Father   . Hypertension Father   . Diabetes Maternal Grandmother   . Hypertension Maternal Grandmother   . Diabetes Maternal Grandfather   . Hypertension Maternal Grandfather   . Diabetes Paternal Grandmother   . Hypertension Paternal Grandmother   . Diabetes Paternal Grandfather   . Hypertension Paternal Grandfather     Social History Social History   Tobacco Use  . Smoking status: Never Smoker  . Smokeless tobacco: Never Used  Substance Use Topics  . Alcohol use: No  . Drug use: No     Allergies   Pumpkin flavor   Review of Systems Review of Systems  Constitutional: Negative for chills and fever.  HENT: Positive for facial swelling,  postnasal drip and sore throat. Negative for congestion, rhinorrhea and trouble swallowing.   Respiratory: Negative for cough, shortness of breath, wheezing and stridor.   Cardiovascular: Negative for leg swelling.  Gastrointestinal: Negative for constipation, diarrhea, nausea and vomiting.  Skin: Positive for rash.  All other systems reviewed and are negative.    Physical Exam Updated Vital Signs BP (!) 155/88 (BP Location: Right Arm)   Pulse 84   Temp 98.2 F (36.8 C) (Oral)   Resp 16   LMP 09/03/2018   SpO2 100%   Physical Exam Vitals signs and nursing note reviewed.  Constitutional:      General: She is not in acute distress.    Appearance: Normal appearance. She is normal weight.  HENT:     Head: Normocephalic and atraumatic.     Nose: No congestion.     Mouth/Throat:     Mouth: Mucous membranes are moist.     Pharynx: Oropharynx is clear. Posterior oropharyngeal erythema present.     Comments: Lip swelling Eyes:     Extraocular Movements: Extraocular movements intact.     Conjunctiva/sclera: Conjunctivae normal.     Pupils: Pupils are equal, round, and reactive to light.  Neck:     Musculoskeletal: Normal range of motion.  Pulmonary:     Effort: Pulmonary effort is normal.     Breath sounds: No stridor. No wheezing.  Lymphadenopathy:     Cervical: No cervical adenopathy.  Skin:    Findings: Rash present. Rash is crusting, pustular and vesicular.       Neurological:     General: No focal deficit present.     Mental Status: She is alert. Mental status is at baseline.  Psychiatric:        Mood and Affect: Mood normal.        Thought Content: Thought content normal.        Judgment: Judgment normal.      ED Treatments / Results  Labs (all labs ordered are listed, but only abnormal results are displayed) Labs Reviewed - No data to display  EKG None  Radiology No results found.  Procedures Procedures (including critical care time)  Medications  Ordered in ED Medications - No data to display   Initial Impression / Assessment and Plan / ED Course  I have reviewed the triage vital signs and the nursing notes.  Pertinent labs & imaging results that were available during my care of the patient were reviewed by me and  considered in my medical decision making (see chart for details).   43 yo female here with PMH HIV, CKD here with rash and facial swelling. No concerning signs for anaphylaxis given no new meds or exposures. Not concerning for angioedema given no new meds and no hx of Acei/Arb therapy. DDX includes: Impetigo, herpes zoster, allergic reaction.  Exam with honey crusting noted on bilateral sides of nares with no complaints of burning or pain of rash.  Also pustules noted which is more along the lines of impetigo.  Will treat as such.  Patient denies any systemic symptoms such as fevers. Lip swelling could be reaction to impetigo.   Given history of untreated HIV we will treat with doxycycline for MRSA coverage. Will also use topical mupirocin given h/o HIV with CD4 count <200 in 2018 and patient not currently on any antiretroviral therapy. Patient counseled on importance of following up with infectious disease.    Patient given strict return precautions, voiced understanding.  [place] Jazminn Pomales, DO PGY-2, [facility]   Final Clinical Impressions(s) / ED Diagnoses   Final diagnoses:  Impetigo  Facial swelling    ED Discharge Orders         Ordered    doxycycline (VIBRAMYCIN) 100 MG capsule  2 times daily     09/11/18 2054    mupirocin ointment (BACTROBAN) 2 %  3 times daily     09/11/18 2054           Fantasha Daniele, [place], DO 09/11/18 2114    Little, Wenda Overland, MD 09/12/18 2330

## 2019-01-13 DIAGNOSIS — H60332 Swimmer's ear, left ear: Secondary | ICD-10-CM | POA: Diagnosis not present

## 2019-04-19 ENCOUNTER — Other Ambulatory Visit (HOSPITAL_COMMUNITY)
Admission: RE | Admit: 2019-04-19 | Discharge: 2019-04-19 | Disposition: A | Discharge status: Home / Self Care | Payer: BC Managed Care – PPO | Source: Ambulatory Visit | Attending: Internal Medicine | Admitting: Internal Medicine

## 2019-04-19 ENCOUNTER — Other Ambulatory Visit: Payer: Self-pay

## 2019-04-19 ENCOUNTER — Other Ambulatory Visit: Payer: Self-pay | Admitting: *Deleted

## 2019-04-19 ENCOUNTER — Other Ambulatory Visit: Payer: BC Managed Care – PPO

## 2019-04-19 DIAGNOSIS — Z113 Encounter for screening for infections with a predominantly sexual mode of transmission: Secondary | ICD-10-CM

## 2019-04-19 DIAGNOSIS — B20 Human immunodeficiency virus [HIV] disease: Secondary | ICD-10-CM | POA: Diagnosis not present

## 2019-04-20 LAB — T-HELPER CELL (CD4) - (RCID CLINIC ONLY)
CD4 % Helper T Cell: 1 % — ABNORMAL LOW (ref 33–65)
CD4 T Cell Abs: <35 /uL — ABNORMAL LOW (ref 400–1790)

## 2019-04-21 LAB — URINE CYTOLOGY ANCILLARY ONLY
Chlamydia: NEGATIVE
Neisseria Gonorrhea: NEGATIVE

## 2019-05-01 LAB — COMPLETE METABOLIC PANEL WITH GFR
AG Ratio: 0.8 (calc) — ABNORMAL LOW (ref 1.0–2.5)
ALT: 12 U/L (ref 6–29)
AST: 19 U/L (ref 10–30)
Albumin: 4 g/dL (ref 3.6–5.1)
Alkaline phosphatase (APISO): 95 U/L (ref 31–125)
BUN: 11 mg/dL (ref 7–25)
CO2: 22 mmol/L (ref 20–32)
Calcium: 9.5 mg/dL (ref 8.6–10.2)
Chloride: 106 mmol/L (ref 98–110)
Creat: 0.71 mg/dL (ref 0.50–1.10)
GFR, Est African American: 123 mL/min/{1.73_m2} (ref >=60)
GFR, Est Non African American: 107 mL/min/{1.73_m2} (ref >=60)
Globulin: 5.1 g/dL (calc) — ABNORMAL HIGH (ref 1.9–3.7)
Glucose, Bld: 83 mg/dL (ref 65–99)
Potassium: 4.1 mmol/L (ref 3.5–5.3)
Sodium: 135 mmol/L (ref 135–146)
Total Bilirubin: 0.3 mg/dL (ref 0.2–1.2)
Total Protein: 9.1 g/dL — ABNORMAL HIGH (ref 6.1–8.1)

## 2019-05-01 LAB — HIV-1 RNA ULTRAQUANT REFLEX TO GENTYP+
HIV 1 RNA Quant: 124000 copies/mL — ABNORMAL HIGH
HIV-1 RNA Quant, Log: 5.09 Log copies/mL — ABNORMAL HIGH

## 2019-05-01 LAB — HIV-1 GENOTYPE: HIV-1 Genotype: DETECTED — AB

## 2019-05-01 LAB — CBC WITH DIFFERENTIAL/PLATELET
Absolute Monocytes: 432 cells/uL (ref 200–950)
Basophils Absolute: 18 cells/uL (ref 0–200)
Basophils Relative: 0.3 %
Eosinophils Absolute: 654 cells/uL — ABNORMAL HIGH (ref 15–500)
Eosinophils Relative: 10.9 %
HCT: 31.8 % — ABNORMAL LOW (ref 35.0–45.0)
Hemoglobin: 9.7 g/dL — ABNORMAL LOW (ref 11.7–15.5)
Lymphs Abs: 1320 cells/uL (ref 850–3900)
MCH: 22.4 pg — ABNORMAL LOW (ref 27.0–33.0)
MCHC: 30.5 g/dL — ABNORMAL LOW (ref 32.0–36.0)
MCV: 73.4 fL — ABNORMAL LOW (ref 80.0–100.0)
MPV: 10.3 fL (ref 7.5–12.5)
Monocytes Relative: 7.2 %
Neutro Abs: 3576 cells/uL (ref 1500–7800)
Neutrophils Relative %: 59.6 %
Platelets: 359 10*3/uL (ref 140–400)
RBC: 4.33 10*6/uL (ref 3.80–5.10)
RDW: 15.5 % — ABNORMAL HIGH (ref 11.0–15.0)
Total Lymphocyte: 22 %
WBC: 6 10*3/uL (ref 3.8–10.8)

## 2019-05-01 LAB — RPR: RPR Ser Ql: NONREACTIVE

## 2019-05-03 ENCOUNTER — Encounter: Payer: Self-pay | Admitting: Internal Medicine

## 2019-05-03 ENCOUNTER — Ambulatory Visit (INDEPENDENT_AMBULATORY_CARE_PROVIDER_SITE_OTHER): Payer: BC Managed Care – PPO | Admitting: Internal Medicine

## 2019-05-03 ENCOUNTER — Other Ambulatory Visit: Payer: Self-pay

## 2019-05-03 ENCOUNTER — Telehealth: Payer: Self-pay | Admitting: Pharmacy Technician

## 2019-05-03 VITALS — BP 148/96 | HR 87 | Temp 98.1°F

## 2019-05-03 DIAGNOSIS — L309 Dermatitis, unspecified: Secondary | ICD-10-CM

## 2019-05-03 DIAGNOSIS — R87613 High grade squamous intraepithelial lesion on cytologic smear of cervix (HGSIL): Secondary | ICD-10-CM

## 2019-05-03 DIAGNOSIS — D508 Other iron deficiency anemias: Secondary | ICD-10-CM

## 2019-05-03 DIAGNOSIS — B20 Human immunodeficiency virus [HIV] disease: Secondary | ICD-10-CM | POA: Diagnosis not present

## 2019-05-03 DIAGNOSIS — Z9189 Other specified personal risk factors, not elsewhere classified: Secondary | ICD-10-CM | POA: Diagnosis not present

## 2019-05-03 MED ORDER — BICTEGRAVIR-EMTRICITAB-TENOFOV 50-200-25 MG PO TABS: 1.0000 | ORAL_TABLET | Freq: Every day | ORAL | 3 refills | Status: DC

## 2019-05-03 MED ORDER — SULFAMETHOXAZOLE-TRIMETHOPRIM 800-160 MG PO TABS: 1.0000 | ORAL_TABLET | Freq: Every day | ORAL | 11 refills | Status: DC

## 2019-05-03 MED ORDER — BICTEGRAVIR-EMTRICITAB-TENOFOV 50-200-25 MG PO TABS: 1.0000 | ORAL_TABLET | Freq: Every day | ORAL | 11 refills | Status: DC

## 2019-05-03 MED ORDER — TRIAMCINOLONE ACETONIDE 0.025 % EX OINT: 1.0000 "application " | TOPICAL_OINTMENT | Freq: Two times a day (BID) | CUTANEOUS | 3 refills | Status: DC

## 2019-05-03 NOTE — Assessment & Plan Note (Signed)
Will start her on Bactrim for PJP prophylaxis

## 2019-05-03 NOTE — Assessment & Plan Note (Addendum)
Will get her on Biktarvy and copay card and pharmacy location in chart.  Labs in 4 weeks and me in 5 weeks.   Will do vaccines next visit, Prevnar, Menveo, will discuss HPV

## 2019-05-03 NOTE — Telephone Encounter (Addendum)
[facility] Patient Advocate Encounter   Was successful in obtaining a [place] copay card for [employer]. This copay card will make the patients copay $0.  I have spoken with the patient and they are aware that the medication has to be filled at [facility].    The billing information is as follows:  RxBin: [id number] PCN: [id number] Member ID: [id number] Group ID: [id number]  I spoke to [facility] and provided them with all the information to create a new profile in their system for the patient. I also indicated in her profile that she would like the medication shipped to the local [facility] store [phone] instead of her home address. [place], [place] [address] [phone]   They should be reaching out to the patient within 24 hours.

## 2019-05-03 NOTE — Assessment & Plan Note (Signed)
Will get her back to NP S Dixon for Pap smear and further management.

## 2019-05-03 NOTE — Assessment & Plan Note (Signed)
Remains anemic and encouraged her to continue with the iron pills

## 2019-05-03 NOTE — Progress Notes (Signed)
   Subjective:    Patient ID: Briana [name], female    DOB: February 18, 1976, 43 y.o.   MRN: [id number]  HPI Here for follow up of HIV I last saw her in April 2018 and had been poorly compliant.  She then never returned and has not been on medications since.  Previously on Genvoya.  CD4 now < 35 and viral load 124,000.  Was approached by our community RN but not successful at the time. Now back in care.  No associated weight loss, no diarrhea.  No recent hospitalizations or other concerns.    Review of Systems  Constitutional: Negative for fatigue.  Gastrointestinal: Negative for diarrhea and nausea.  Skin: Negative for rash.  Neurological: Negative for dizziness.       Objective:   Physical Exam Constitutional:      Appearance: Normal appearance.  Eyes:     General: No scleral icterus. Cardiovascular:     Rate and Rhythm: Normal rate and regular rhythm.     Heart sounds: No murmur.  Pulmonary:     Effort: Pulmonary effort is normal. No respiratory distress.     Breath sounds: Normal breath sounds.  Skin:    Findings: No rash.  Neurological:     Mental Status: She is alert.  Psychiatric:        Mood and Affect: Mood normal.   SH: no tobacco        Assessment & Plan:

## 2019-05-03 NOTE — Assessment & Plan Note (Signed)
Will give her triamcinolone

## 2019-05-06 NOTE — Telephone Encounter (Signed)
Patient called this morning concerned that she still had not heard from [facility] to confirm shipment. When I spoke to [facility], they stated that they had tried calling twice to the patient to set up delivery but she has not received any calls or voicemail from them at this time. I provided her with the number [phone] to reach out to them and see what the delay. She will call us back once the shipment has been confirmed.

## 2019-05-18 ENCOUNTER — Ambulatory Visit: Payer: BC Managed Care – PPO | Admitting: Infectious Diseases

## 2019-05-19 ENCOUNTER — Ambulatory Visit: Payer: BC Managed Care – PPO | Admitting: Infectious Diseases

## 2019-05-23 ENCOUNTER — Other Ambulatory Visit: Payer: Self-pay

## 2019-05-23 ENCOUNTER — Other Ambulatory Visit: Payer: Self-pay | Admitting: Infectious Diseases

## 2019-05-23 ENCOUNTER — Ambulatory Visit (INDEPENDENT_AMBULATORY_CARE_PROVIDER_SITE_OTHER): Payer: BC Managed Care – PPO | Admitting: Infectious Diseases

## 2019-05-23 ENCOUNTER — Other Ambulatory Visit (HOSPITAL_COMMUNITY)
Admission: RE | Admit: 2019-05-23 | Discharge: 2019-05-23 | Disposition: A | Discharge status: Home / Self Care | Payer: BC Managed Care – PPO | Source: Ambulatory Visit | Attending: Infectious Diseases | Admitting: Infectious Diseases

## 2019-05-23 DIAGNOSIS — B3731 Acute candidiasis of vulva and vagina: Secondary | ICD-10-CM

## 2019-05-23 DIAGNOSIS — Z23 Encounter for immunization: Secondary | ICD-10-CM | POA: Diagnosis not present

## 2019-05-23 DIAGNOSIS — Z113 Encounter for screening for infections with a predominantly sexual mode of transmission: Secondary | ICD-10-CM | POA: Diagnosis not present

## 2019-05-23 DIAGNOSIS — B373 Candidiasis of vulva and vagina: Secondary | ICD-10-CM

## 2019-05-23 DIAGNOSIS — D508 Other iron deficiency anemias: Secondary | ICD-10-CM

## 2019-05-23 DIAGNOSIS — N920 Excessive and frequent menstruation with regular cycle: Secondary | ICD-10-CM

## 2019-05-23 DIAGNOSIS — Z9189 Other specified personal risk factors, not elsewhere classified: Secondary | ICD-10-CM

## 2019-05-23 DIAGNOSIS — B20 Human immunodeficiency virus [HIV] disease: Secondary | ICD-10-CM

## 2019-05-23 DIAGNOSIS — Z124 Encounter for screening for malignant neoplasm of cervix: Secondary | ICD-10-CM

## 2019-05-23 DIAGNOSIS — Z8742 Personal history of other diseases of the female genital tract: Secondary | ICD-10-CM

## 2019-05-23 MED ORDER — FLUCONAZOLE 150 MG PO TABS: 150.0000 mg | ORAL_TABLET | Freq: Once | ORAL | 0 refills | Status: AC

## 2019-05-23 NOTE — Assessment & Plan Note (Signed)
Previous history of colposcopy. Will check cytology and HPV with typing today.  Screen G/C/Trich.  Discussed recommended screening interval for women living with HIV disease. Will continue annual screenings for now with consideration to increase interval to q56yr pending further discussions and testing.

## 2019-05-23 NOTE — Assessment & Plan Note (Signed)
Likely related to heavy menses as she describes. Encouraged to take her iron supplements and discussed correct timing away from HIV medication to safely treat both conditions.

## 2019-05-23 NOTE — Progress Notes (Signed)
Subjective:    Briana [name] is a 43 y.o. female here for an annual pelvic exam and pap smear.   Review of Systems: Current GYN complaints or concerns: For several months she has had heavy menstrual periods. Saturates through a tampon and pad in 2-3 hours the first two days of her cycle. They are regular. She never has dizziness or lightheadedness. Does have fatigue but "no more than usual." She thinks her mother had a history of this as well (ultimately required hysterectomy). No known fibroids. She has 4 children. Sexually active with 1 female partner. No pelvic pain/fullness.   Last pap smear 2014 - normal cytology and +hpv without typing performed.    Past Medical History:  Diagnosis Date  . Abnormal Pap smear    s/p colposcopy   . Anemia   . Chronic kidney disease 03/2013   kidney infection  . HIV infection ([place])   . Screening for malignant neoplasm of the cervix     Gynecologic History: [id number]  No LMP recorded. Contraception: condoms Last Pap: 2014. Results were: abnormal (+hpv, neg cytology) Anal Intercourse: no Last Mammogram: never  Objective:  Physical Exam  Constitutional: Well developed, well nourished, no acute distress. She is alert and oriented x3.  Pelvic: External genitalia is normal in appearance. The vagina is normal in appearance. The cervix is bulbous and easily visualized. No CMT. Thick curd like discharge around cervix. Bimanual exam reveals uterus that is felt to be normal size, shape, and contour. No adnexal masses or tenderness noted. Breasts: symmetrical in contour, shape and texture. Psych: She has a normal mood and affect.    Assessment:  Normal bimanual exam Vaginal candidiasis  HIV, +AIDS   Plan:   Problem List Items Addressed This Visit      Unprioritized   Human immunodeficiency virus (HIV) disease ([place]) (Chronic)    Encouraged to continue taking her biktarvy regularly.       Relevant Medications   fluconazole (DIFLUCAN)  150 MG tablet   Iron deficiency anemia    Likely related to heavy menses as she describes. Encouraged to take her iron supplements and discussed correct timing away from HIV medication to safely treat both conditions.       History of abnormal cervical Pap smear   At risk for opportunistic infections    I encouraged her to continue her Bactrim or OI prophylaxis and Biktarvy for HIV treatment. Congratulated her for coming back to care.       Screening for cervical cancer - Primary    Previous history of colposcopy. Will check cytology and HPV with typing today.  Screen G/C/Trich.  Discussed recommended screening interval for women [name] with HIV disease. Will continue annual screenings for now with consideration to increase interval to q14yr pending further discussions and testing.       Relevant Orders   Urine cytology ancillary only   Cytology - PAP( [place])   Heavy menstrual bleeding    No fibroids detected on exam. Discussed next step would be consideration for vaginal ultrasound with GYN referral. OCPs vs IUD to manage. Iron supplementation for associated deficiency.       Vaginal candidiasis    Likely related to low CD4 count. Will give diflucan 150 mg x 1 with repeated dose a week later if needed. Encouraged natural probiotics in yogurt/cheeses to help prevent but the best thing she can do is correct her immunodeficiency with daily Biktarvy.  Relevant Medications   fluconazole (DIFLUCAN) 150 MG tablet    Other Visit Diagnoses    Screening for STDs (sexually transmitted diseases)       Relevant Orders   Urine cytology ancillary only   Cytology - PAP( [place])      [name], MSN, NP-C [place] [facility] [place] [facility] Office: [phone] Pager: [phone]  05/23/19 11:34 AM

## 2019-05-23 NOTE — Assessment & Plan Note (Signed)
Likely related to low CD4 count. Will give diflucan 150 mg x 1 with repeated dose a week later if needed. Encouraged natural probiotics in yogurt/cheeses to help prevent but the best thing she can do is correct her immunodeficiency with daily Biktarvy.

## 2019-05-23 NOTE — Patient Instructions (Signed)
Please continue taking your Biktarvy and Bactrim (antibiotic for immune protection) every day.   Will treat your yeast infection with one round of Diflucan - take one pill once and repeat in 1 week if you need to.  Add in yogurt, hard cheeses to your diet to help reduce the likelihood of this.  Getting your immune system better will help to prevent this in the future.

## 2019-05-23 NOTE — Assessment & Plan Note (Signed)
No fibroids detected on exam. Discussed next step would be consideration for vaginal ultrasound with GYN referral. OCPs vs IUD to manage. Iron supplementation for associated deficiency.

## 2019-05-23 NOTE — Assessment & Plan Note (Signed)
I encouraged her to continue her Bactrim or OI prophylaxis and Biktarvy for HIV treatment. Congratulated her for coming back to care.

## 2019-05-23 NOTE — Assessment & Plan Note (Signed)
Encouraged to continue taking her biktarvy regularly.

## 2019-05-25 LAB — CYTOLOGY - PAP
Diagnosis: NEGATIVE
HPV 16: NEGATIVE
HPV 18 / 45: NEGATIVE
High risk HPV: POSITIVE — AB
Molecular Disclaimer: 56
Molecular Disclaimer: DETECTED
Molecular Disclaimer: NEGATIVE
Molecular Disclaimer: NORMAL
Molecular Disclaimer: NORMAL

## 2019-05-25 LAB — URINE CYTOLOGY ANCILLARY ONLY: Candida vaginitis: NEGATIVE

## 2019-05-27 MED ORDER — METRONIDAZOLE 500 MG PO TABS: 500.0000 mg | ORAL_TABLET | Freq: Two times a day (BID) | ORAL | 0 refills | Status: DC

## 2019-05-27 NOTE — Addendum Note (Signed)
Addended by: Duncan Callas on: 05/27/2019 01:06 PM   Modules accepted: Orders

## 2019-05-27 NOTE — Progress Notes (Signed)
Pap smear cytology normal with + High RIsk HPV (not 16, 18, 45) Results released to the patient with recommendations to repeat cotesting again in 1 year.  STI screen + for trichomonas - treat with metronidazole 500 mg BID x 7d.

## 2019-05-30 ENCOUNTER — Telehealth: Payer: Self-pay

## 2019-05-30 NOTE — Telephone Encounter (Signed)
Patient called office today stating [facility] did not have her prescription for flagyl. Patient states she used the automated system and was told no prescriptions were ready to pick up. Advised patient to call pharmacy and speak with someone at the pharmacy to confirm this. Patient will call office back if we need to resend prescription. [place]

## 2019-06-08 ENCOUNTER — Ambulatory Visit: Payer: BC Managed Care – PPO | Admitting: Internal Medicine

## 2019-06-08 LAB — URINE CYTOLOGY ANCILLARY ONLY
Chlamydia: NEGATIVE
Neisseria Gonorrhea: NEGATIVE

## 2019-06-21 ENCOUNTER — Ambulatory Visit: Payer: BC Managed Care – PPO | Admitting: Internal Medicine

## 2019-06-23 ENCOUNTER — Encounter: Payer: Self-pay | Admitting: Internal Medicine

## 2019-06-23 ENCOUNTER — Other Ambulatory Visit: Payer: Self-pay

## 2019-06-23 ENCOUNTER — Ambulatory Visit (INDEPENDENT_AMBULATORY_CARE_PROVIDER_SITE_OTHER): Payer: BC Managed Care – PPO | Admitting: Internal Medicine

## 2019-06-23 VITALS — BP 131/89 | HR 92 | Temp 97.9°F | Wt 177.0 lb

## 2019-06-23 DIAGNOSIS — L738 Other specified follicular disorders: Secondary | ICD-10-CM | POA: Diagnosis not present

## 2019-06-23 DIAGNOSIS — Z23 Encounter for immunization: Secondary | ICD-10-CM | POA: Insufficient documentation

## 2019-06-23 DIAGNOSIS — D7218 Eosinophilia in diseases classified elsewhere: Secondary | ICD-10-CM | POA: Diagnosis not present

## 2019-06-23 DIAGNOSIS — Z9189 Other specified personal risk factors, not elsewhere classified: Secondary | ICD-10-CM

## 2019-06-23 DIAGNOSIS — B20 Human immunodeficiency virus [HIV] disease: Secondary | ICD-10-CM

## 2019-06-23 MED ORDER — SULFAMETHOXAZOLE-TRIMETHOPRIM 800-160 MG PO TABS: 1.0000 | ORAL_TABLET | Freq: Every day | ORAL | 5 refills | Status: DC

## 2019-06-23 NOTE — Assessment & Plan Note (Signed)
I refilled the Bactrim and she knows to take it daily

## 2019-06-23 NOTE — Progress Notes (Signed)
   Subjective:    Patient ID: Briana [name], female    DOB: 25-Aug-1976, 43 y.o.   MRN: [id number]  HPI Here for follow up of HIV She has a long history of poor compliance and returned in August with a CD4 of < 35 and a viral load 124,000.  She did restart Biktarvy and tolerating well.  No missed doses.  Had her PAP smear and to return in 1 year.  Was taking Bactrim but ran out and wasn't sure if she needed refills.  No associated weight loss or diarrhea.    Review of Systems  Constitutional: Negative for fatigue and unexpected weight change.  Gastrointestinal: Negative for diarrhea and nausea.  Skin: Negative for rash.  Neurological: Negative for dizziness.       Objective:   Physical Exam Constitutional:      Appearance: Normal appearance.  Eyes:     General: No scleral icterus. Cardiovascular:     Rate and Rhythm: Normal rate and regular rhythm.     Heart sounds: No murmur.  Pulmonary:     Effort: Pulmonary effort is normal.     Breath sounds: Normal breath sounds.  Abdominal:     Palpations: Abdomen is soft.  Skin:    Findings: Rash present.     Comments: Has a facial rash, some pruritis.   Neurological:     Mental Status: She is alert.  Psychiatric:        Mood and Affect: Mood normal.   SH: no tobacco        Assessment & Plan:

## 2019-06-23 NOTE — Assessment & Plan Note (Signed)
Rash on face.  I suspect will resolve once her immune system improves.  Can use steroid cream on it for now.

## 2019-06-23 NOTE — Assessment & Plan Note (Signed)
Back on track.  I again discussed the importance of staying on the medication and keeping up with appointments.  She will get labs today and rtc 2 months.

## 2019-06-23 NOTE — Telephone Encounter (Signed)
[facility] Patient Advocate Encounter  Spoke with the patient today at her appointment to make sure she was able to get her medication from [employer] [employer]. She was able to get the shipment to the [facility] store but they were still trying to charge her $45. After multiple phone calls she was able to get someone to apply the copay card. I printed off a copy of her Philis Fendt card if she needed it for [facility]. She is going to ask for her next refill 10 days early since it has been taking several days to fill each time. She knows to call the office if they are unable to ship the medication.

## 2019-06-23 NOTE — Assessment & Plan Note (Signed)
Will give her Prevnar and Menveo #1 today

## 2019-06-23 NOTE — Addendum Note (Signed)
Addended by: Lenore Cordia on: 06/23/2019 03:59 PM   Modules accepted: Orders

## 2019-06-24 LAB — T-HELPER CELL (CD4) - (RCID CLINIC ONLY)
CD4 % Helper T Cell: 5 % — ABNORMAL LOW (ref 33–65)
CD4 T Cell Abs: 81 /uL — ABNORMAL LOW (ref 400–1790)

## 2019-07-15 LAB — HIV-1 RNA QUANT-NO REFLEX-BLD
HIV 1 RNA Quant: <20 copies/mL — AB
HIV-1 RNA Quant, Log: <1.3 Log copies/mL — AB

## 2019-07-15 LAB — HIV-1 GENOTYPING (RTI,PI,IN INHBTR): HIV-1 Genotype: NOT DETECTED

## 2019-08-24 ENCOUNTER — Ambulatory Visit: Payer: BC Managed Care – PPO | Admitting: Internal Medicine

## 2019-09-08 ENCOUNTER — Other Ambulatory Visit: Payer: Self-pay | Admitting: Internal Medicine

## 2019-09-08 DIAGNOSIS — B20 Human immunodeficiency virus [HIV] disease: Secondary | ICD-10-CM

## 2019-09-13 ENCOUNTER — Ambulatory Visit (INDEPENDENT_AMBULATORY_CARE_PROVIDER_SITE_OTHER): Payer: BC Managed Care – PPO | Admitting: Internal Medicine

## 2019-09-13 ENCOUNTER — Encounter: Payer: Self-pay | Admitting: Internal Medicine

## 2019-09-13 ENCOUNTER — Other Ambulatory Visit: Payer: Self-pay

## 2019-09-13 DIAGNOSIS — L309 Dermatitis, unspecified: Secondary | ICD-10-CM

## 2019-09-13 DIAGNOSIS — Z23 Encounter for immunization: Secondary | ICD-10-CM

## 2019-09-13 DIAGNOSIS — Z9189 Other specified personal risk factors, not elsewhere classified: Secondary | ICD-10-CM | POA: Diagnosis not present

## 2019-09-13 DIAGNOSIS — B20 Human immunodeficiency virus [HIV] disease: Secondary | ICD-10-CM

## 2019-09-13 MED ORDER — TRIAMCINOLONE ACETONIDE 0.025 % EX OINT: 1.0000 "application " | TOPICAL_OINTMENT | Freq: Two times a day (BID) | CUTANEOUS | 11 refills | Status: DC

## 2019-09-13 NOTE — Assessment & Plan Note (Signed)
She will continue with Bactrim 

## 2019-09-13 NOTE — Assessment & Plan Note (Signed)
Doing well, back on track.  Labs today and she can rtc in 4 months.

## 2019-09-13 NOTE — Progress Notes (Signed)
   Subjective:    Patient ID: Briana [name], female    DOB: August 24, 1976, 44 y.o.   MRN: [id number]  HPI Here for follow up of HIV Back on Biktarvy and no concerns.  Last visit the viral load was suppressed and the CD4 was up to 81 from 0.  No new issues.  Continued eczema.  No weight loss or diarrhea. Continuing on Bactrim as well.    Review of Systems  Constitutional: Negative for fatigue and unexpected weight change.  Gastrointestinal: Negative for diarrhea and nausea.       Objective:   Physical Exam Constitutional:      Appearance: Normal appearance.  Skin:    Findings: Rash present.     Comments: C/w eczema  Neurological:     Mental Status: She is alert.   SH: no tobacco        Assessment & Plan:

## 2019-09-13 NOTE — Assessment & Plan Note (Signed)
I have refilled her steroid ointment.

## 2019-09-13 NOTE — Assessment & Plan Note (Signed)
Due for pneumovax 23 #2 and given today

## 2019-09-14 LAB — T-HELPER CELL (CD4) - (RCID CLINIC ONLY)
CD4 % Helper T Cell: 7 % — ABNORMAL LOW (ref 33–65)
CD4 T Cell Abs: 139 /uL — ABNORMAL LOW (ref 400–1790)

## 2019-09-17 LAB — HIV-1 RNA QUANT-NO REFLEX-BLD
HIV 1 RNA Quant: <20 copies/mL — AB
HIV-1 RNA Quant, Log: <1.3 Log copies/mL — AB

## 2019-09-26 ENCOUNTER — Other Ambulatory Visit: Payer: Self-pay

## 2019-09-26 DIAGNOSIS — B20 Human immunodeficiency virus [HIV] disease: Secondary | ICD-10-CM

## 2019-09-26 MED ORDER — BICTEGRAVIR-EMTRICITAB-TENOFOV 50-200-25 MG PO TABS: 1.0000 | ORAL_TABLET | Freq: Every day | ORAL | 3 refills | Status: DC

## 2019-11-29 ENCOUNTER — Telehealth: Payer: Self-pay | Admitting: Pharmacy Technician

## 2019-11-29 ENCOUNTER — Other Ambulatory Visit: Payer: Self-pay

## 2019-11-29 DIAGNOSIS — B20 Human immunodeficiency virus [HIV] disease: Secondary | ICD-10-CM

## 2019-11-29 MED ORDER — BICTEGRAVIR-EMTRICITAB-TENOFOV 50-200-25 MG PO TABS: 1.0000 | ORAL_TABLET | Freq: Every day | ORAL | 0 refills | Status: DC

## 2019-11-29 NOTE — Telephone Encounter (Signed)
[facility] Patient Advocate Encounter  [place] called this morning to make [place] aware that starting April 1, the patient will need to fill through their pharmacy rather than [facility]. The phone number she provided for their pharmacy is [phone] and fax is [phone]. Once the scripts are received electronically, they will reach out to find out her shipping preference. Previously she had it shipped to an alternative location because of long work hours. I called and left the patient a voicemail to make her aware of the change as well.

## 2019-12-08 ENCOUNTER — Encounter: Payer: Self-pay | Admitting: Pharmacy Technician

## 2019-12-26 ENCOUNTER — Other Ambulatory Visit: Payer: Self-pay

## 2019-12-26 DIAGNOSIS — Z79899 Other long term (current) drug therapy: Secondary | ICD-10-CM

## 2019-12-26 DIAGNOSIS — B20 Human immunodeficiency virus [HIV] disease: Secondary | ICD-10-CM

## 2019-12-26 DIAGNOSIS — Z113 Encounter for screening for infections with a predominantly sexual mode of transmission: Secondary | ICD-10-CM

## 2019-12-28 ENCOUNTER — Other Ambulatory Visit: Payer: Self-pay

## 2019-12-28 ENCOUNTER — Other Ambulatory Visit (HOSPITAL_COMMUNITY)
Admission: RE | Admit: 2019-12-28 | Discharge: 2019-12-28 | Disposition: A | Discharge status: Home / Self Care | Payer: BC Managed Care – PPO | Source: Ambulatory Visit | Attending: Internal Medicine | Admitting: Internal Medicine

## 2019-12-28 ENCOUNTER — Other Ambulatory Visit: Payer: BC Managed Care – PPO

## 2019-12-28 DIAGNOSIS — Z113 Encounter for screening for infections with a predominantly sexual mode of transmission: Secondary | ICD-10-CM | POA: Diagnosis not present

## 2019-12-28 DIAGNOSIS — Z79899 Other long term (current) drug therapy: Secondary | ICD-10-CM | POA: Diagnosis not present

## 2019-12-28 DIAGNOSIS — B20 Human immunodeficiency virus [HIV] disease: Secondary | ICD-10-CM

## 2019-12-29 LAB — T-HELPER CELL (CD4) - (RCID CLINIC ONLY)
CD4 % Helper T Cell: 10 % — ABNORMAL LOW (ref 33–65)
CD4 T Cell Abs: 189 /uL — ABNORMAL LOW (ref 400–1790)

## 2019-12-29 LAB — URINE CYTOLOGY ANCILLARY ONLY
Chlamydia: NEGATIVE
Comment: NEGATIVE
Comment: NORMAL
Neisseria Gonorrhea: NEGATIVE

## 2019-12-30 LAB — CBC WITH DIFFERENTIAL/PLATELET
Absolute Monocytes: 395 cells/uL (ref 200–950)
Basophils Absolute: 61 cells/uL (ref 0–200)
Basophils Relative: 0.8 %
Eosinophils Absolute: 441 cells/uL (ref 15–500)
Eosinophils Relative: 5.8 %
HCT: 28.9 % — ABNORMAL LOW (ref 35.0–45.0)
Hemoglobin: 8.6 g/dL — ABNORMAL LOW (ref 11.7–15.5)
Lymphs Abs: 1968 cells/uL (ref 850–3900)
MCH: 20.4 pg — ABNORMAL LOW (ref 27.0–33.0)
MCHC: 29.8 g/dL — ABNORMAL LOW (ref 32.0–36.0)
MCV: 68.6 fL — ABNORMAL LOW (ref 80.0–100.0)
MPV: 10.7 fL (ref 7.5–12.5)
Monocytes Relative: 5.2 %
Neutro Abs: 4735 cells/uL (ref 1500–7800)
Neutrophils Relative %: 62.3 %
Platelets: 429 10*3/uL — ABNORMAL HIGH (ref 140–400)
RBC: 4.21 10*6/uL (ref 3.80–5.10)
RDW: 15.7 % — ABNORMAL HIGH (ref 11.0–15.0)
Total Lymphocyte: 25.9 %
WBC: 7.6 10*3/uL (ref 3.8–10.8)

## 2019-12-30 LAB — COMPREHENSIVE METABOLIC PANEL
AG Ratio: 0.9 (calc) — ABNORMAL LOW (ref 1.0–2.5)
ALT: 8 U/L (ref 6–29)
AST: 15 U/L (ref 10–30)
Albumin: 4.1 g/dL (ref 3.6–5.1)
Alkaline phosphatase (APISO): 90 U/L (ref 31–125)
BUN: 11 mg/dL (ref 7–25)
CO2: 23 mmol/L (ref 20–32)
Calcium: 9.5 mg/dL (ref 8.6–10.2)
Chloride: 102 mmol/L (ref 98–110)
Creat: 0.85 mg/dL (ref 0.50–1.10)
Globulin: 4.5 g/dL (calc) — ABNORMAL HIGH (ref 1.9–3.7)
Glucose, Bld: 89 mg/dL (ref 65–99)
Potassium: 4.2 mmol/L (ref 3.5–5.3)
Sodium: 133 mmol/L — ABNORMAL LOW (ref 135–146)
Total Bilirubin: 0.2 mg/dL (ref 0.2–1.2)
Total Protein: 8.6 g/dL — ABNORMAL HIGH (ref 6.1–8.1)

## 2019-12-30 LAB — HIV-1 RNA QUANT-NO REFLEX-BLD
HIV 1 RNA Quant: 77 copies/mL — ABNORMAL HIGH
HIV-1 RNA Quant, Log: 1.89 Log copies/mL — ABNORMAL HIGH

## 2019-12-30 LAB — LIPID PANEL
Cholesterol: 192 mg/dL (ref <200)
HDL: 51 mg/dL (ref >=50)
LDL Cholesterol (Calc): 110 mg/dL (calc) — ABNORMAL HIGH
Non-HDL Cholesterol (Calc): 141 mg/dL (calc) — ABNORMAL HIGH (ref <130)
Total CHOL/HDL Ratio: 3.8 (calc) (ref <5.0)
Triglycerides: 190 mg/dL — ABNORMAL HIGH (ref <150)

## 2019-12-30 LAB — RPR: RPR Ser Ql: NONREACTIVE

## 2020-01-10 ENCOUNTER — Telehealth: Payer: Self-pay

## 2020-01-10 NOTE — Telephone Encounter (Signed)
COVID-19 Pre-Screening Questions:01/10/20  Do you currently have a fever (>100 F), chills or unexplained body aches?NO  Are you currently experiencing new cough, shortness of breath, sore throat, runny nose? NO  .  Have you recently travelled outside the state of [place] in the last 14 days?NO .  Have you been in contact with someone that is currently pending confirmation of Covid19 testing or has been confirmed to have the [place] virus?  NO  **If the patient answers NO to ALL questions -  advise the patient to please call the clinic before coming to the office should any symptoms develop.

## 2020-01-11 ENCOUNTER — Other Ambulatory Visit: Payer: Self-pay

## 2020-01-11 ENCOUNTER — Ambulatory Visit: Payer: BC Managed Care – PPO | Admitting: Internal Medicine

## 2020-01-11 ENCOUNTER — Encounter: Payer: Self-pay | Admitting: Internal Medicine

## 2020-01-11 VITALS — BP 157/91 | HR 80 | Temp 98.1°F | Ht 64.0 in | Wt 181.0 lb

## 2020-01-11 DIAGNOSIS — D508 Other iron deficiency anemias: Secondary | ICD-10-CM | POA: Diagnosis not present

## 2020-01-11 DIAGNOSIS — L309 Dermatitis, unspecified: Secondary | ICD-10-CM

## 2020-01-11 DIAGNOSIS — B20 Human immunodeficiency virus [HIV] disease: Secondary | ICD-10-CM | POA: Diagnosis not present

## 2020-01-11 DIAGNOSIS — Z9189 Other specified personal risk factors, not elsewhere classified: Secondary | ICD-10-CM

## 2020-01-11 MED ORDER — FERROUS SULFATE 325 (65 FE) MG PO TABS: 325.0000 mg | ORAL_TABLET | Freq: Two times a day (BID) | ORAL | 5 refills | Status: DC

## 2020-01-11 MED ORDER — TRIAMCINOLONE ACETONIDE 0.5 % EX OINT: 1.0000 "application " | TOPICAL_OINTMENT | Freq: Two times a day (BID) | CUTANEOUS | 5 refills | Status: DC

## 2020-01-11 MED ORDER — SULFAMETHOXAZOLE-TRIMETHOPRIM 800-160 MG PO TABS: 1.0000 | ORAL_TABLET | Freq: Every day | ORAL | 5 refills | Status: DC

## 2020-01-11 NOTE — Assessment & Plan Note (Signed)
She will continue with Bactrim 

## 2020-01-11 NOTE — Progress Notes (Signed)
   Subjective:    Patient ID: Fuller Mandril, female    DOB: 09/09/1975, 44 y.o.   MRN: [id number]  HPI Here for follow up of HIV She continues on Biktarvy and no issues with the medication.  No associated n/v/d.  No rash.   CD4 up to 186, viral load of 77 copies.  No new concerns.  REmains anemic and previously noted to be iron deficient.     Review of Systems  Constitutional: Negative for fatigue.  Gastrointestinal: Negative for diarrhea and nausea.  Skin: Negative for rash.       Objective:   Physical Exam Constitutional:      Appearance: Normal appearance.  Eyes:     General: No scleral icterus. Cardiovascular:     Rate and Rhythm: Normal rate and regular rhythm.     Heart sounds: No murmur.  Pulmonary:     Effort: Pulmonary effort is normal. No respiratory distress.     Breath sounds: Normal breath sounds.  Neurological:     General: No focal deficit present.     Mental Status: She is alert.  Psychiatric:        Mood and Affect: Mood normal.   SH: no tobacco        Assessment & Plan:

## 2020-01-11 NOTE — Assessment & Plan Note (Signed)
Will send in a higher dose of triamcinolone

## 2020-01-11 NOTE — Assessment & Plan Note (Signed)
Remains low and she has not been on iron.  Will send in again and she will take bid for a while then can take daily.   Will recheck next visit

## 2020-02-06 ENCOUNTER — Other Ambulatory Visit: Payer: Self-pay

## 2020-02-06 DIAGNOSIS — B20 Human immunodeficiency virus [HIV] disease: Secondary | ICD-10-CM

## 2020-02-06 MED ORDER — BICTEGRAVIR-EMTRICITAB-TENOFOV 50-200-25 MG PO TABS: 1.0000 | ORAL_TABLET | Freq: Every day | ORAL | 4 refills | Status: DC

## 2020-06-18 ENCOUNTER — Other Ambulatory Visit: Payer: BC Managed Care – PPO

## 2020-07-09 ENCOUNTER — Encounter: Payer: Managed Care, Other (non HMO) | Admitting: Internal Medicine

## 2020-09-03 ENCOUNTER — Other Ambulatory Visit: Payer: Managed Care, Other (non HMO)

## 2020-09-10 ENCOUNTER — Other Ambulatory Visit: Payer: Managed Care, Other (non HMO)

## 2020-09-18 ENCOUNTER — Encounter: Payer: Managed Care, Other (non HMO) | Admitting: Internal Medicine

## 2020-09-19 ENCOUNTER — Other Ambulatory Visit: Payer: Managed Care, Other (non HMO)

## 2020-09-19 ENCOUNTER — Other Ambulatory Visit: Payer: Self-pay

## 2020-09-19 DIAGNOSIS — B20 Human immunodeficiency virus [HIV] disease: Secondary | ICD-10-CM

## 2020-09-19 DIAGNOSIS — D508 Other iron deficiency anemias: Secondary | ICD-10-CM

## 2020-09-20 LAB — T-HELPER CELL (CD4) - (RCID CLINIC ONLY)
CD4 % Helper T Cell: 14 % — ABNORMAL LOW (ref 33–65)
CD4 T Cell Abs: 372 /uL — ABNORMAL LOW (ref 400–1790)

## 2020-09-21 LAB — CBC WITH DIFFERENTIAL/PLATELET
Absolute Monocytes: 476 cells/uL (ref 200–950)
Basophils Absolute: 28 cells/uL (ref 0–200)
Basophils Relative: 0.4 %
Eosinophils Absolute: 504 cells/uL — ABNORMAL HIGH (ref 15–500)
Eosinophils Relative: 7.1 %
HCT: 35.3 % (ref 35.0–45.0)
Hemoglobin: 11.2 g/dL — ABNORMAL LOW (ref 11.7–15.5)
Lymphs Abs: 2556 cells/uL (ref 850–3900)
MCH: 23.1 pg — ABNORMAL LOW (ref 27.0–33.0)
MCHC: 31.7 g/dL — ABNORMAL LOW (ref 32.0–36.0)
MCV: 72.9 fL — ABNORMAL LOW (ref 80.0–100.0)
MPV: 11 fL (ref 7.5–12.5)
Monocytes Relative: 6.7 %
Neutro Abs: 3536 cells/uL (ref 1500–7800)
Neutrophils Relative %: 49.8 %
Platelets: 350 10*3/uL (ref 140–400)
RBC: 4.84 10*6/uL (ref 3.80–5.10)
RDW: 15.1 % — ABNORMAL HIGH (ref 11.0–15.0)
Total Lymphocyte: 36 %
WBC: 7.1 10*3/uL (ref 3.8–10.8)

## 2020-09-21 LAB — HIV-1 RNA QUANT-NO REFLEX-BLD
HIV 1 RNA Quant: <20 Copies/mL — ABNORMAL HIGH
HIV-1 RNA Quant, Log: <1.3 Log cps/mL — ABNORMAL HIGH

## 2020-09-25 ENCOUNTER — Telehealth: Payer: Self-pay

## 2020-09-25 ENCOUNTER — Encounter: Payer: Managed Care, Other (non HMO) | Admitting: Internal Medicine

## 2020-09-25 DIAGNOSIS — B20 Human immunodeficiency virus [HIV] disease: Secondary | ICD-10-CM

## 2020-09-25 MED ORDER — BICTEGRAVIR-EMTRICITAB-TENOFOV 50-200-25 MG PO TABS: 1.0000 | ORAL_TABLET | Freq: Every day | ORAL | 3 refills | Status: DC

## 2020-09-25 NOTE — Telephone Encounter (Signed)
Received refill request for Biktarvy from [facility]. RN spoke to patient, she does need a refill for the Biktarvy, but has not been taking the Bactrim. Per Dr. Linus Salmons, it is okay for her to stop the Bactrim as her CD4 count is greater than 200. RN relayed this to the patient and advised her we would send in a Biktarvy refill. Patient verbalized understanding and has no further questions.   Beryle Flock, RN

## 2020-10-03 ENCOUNTER — Other Ambulatory Visit: Payer: Self-pay

## 2020-10-03 ENCOUNTER — Encounter: Payer: Self-pay | Admitting: Internal Medicine

## 2020-10-03 ENCOUNTER — Ambulatory Visit (INDEPENDENT_AMBULATORY_CARE_PROVIDER_SITE_OTHER): Payer: Managed Care, Other (non HMO) | Admitting: Internal Medicine

## 2020-10-03 VITALS — BP 137/90 | HR 98 | Temp 98.0°F | Ht 65.0 in | Wt 172.0 lb

## 2020-10-03 DIAGNOSIS — Z79899 Other long term (current) drug therapy: Secondary | ICD-10-CM | POA: Diagnosis not present

## 2020-10-03 DIAGNOSIS — Z113 Encounter for screening for infections with a predominantly sexual mode of transmission: Secondary | ICD-10-CM | POA: Insufficient documentation

## 2020-10-03 DIAGNOSIS — Z23 Encounter for immunization: Secondary | ICD-10-CM | POA: Diagnosis not present

## 2020-10-03 DIAGNOSIS — B20 Human immunodeficiency virus [HIV] disease: Secondary | ICD-10-CM

## 2020-10-03 DIAGNOSIS — D508 Other iron deficiency anemias: Secondary | ICD-10-CM

## 2020-10-03 DIAGNOSIS — L0100 Impetigo, unspecified: Secondary | ICD-10-CM | POA: Insufficient documentation

## 2020-10-03 MED ORDER — FERROUS SULFATE 325 (65 FE) MG PO TABS: 325.0000 mg | ORAL_TABLET | Freq: Every day | ORAL | 5 refills | Status: DC

## 2020-10-03 MED ORDER — MUPIROCIN 2 % EX OINT: 1.0000 "application " | TOPICAL_OINTMENT | Freq: Two times a day (BID) | CUTANEOUS | 1 refills | Status: AC

## 2020-10-03 NOTE — Progress Notes (Signed)
   Subjective:    Patient ID: Briana [name], female    DOB: 10-01-1975, 45 y.o.   MRN: [id number]  HPI Here for follow up of HIV She continues on Biktarvy and no missed doses.  CD4 now up to 372 and viral load < 20.  Her Hgb also is up to 11.2 and she feels much better, less fatigued.  Needs a flu shot.  Has a crusted area over her exterior nasal fold.     Review of Systems  Constitutional: Negative for fatigue.  Gastrointestinal: Negative for diarrhea and nausea.  Skin: Negative for rash.       Objective:   Physical Exam Constitutional:      Appearance: Normal appearance.  Cardiovascular:     Rate and Rhythm: Normal rate and regular rhythm.  Pulmonary:     Effort: Pulmonary effort is normal.  Neurological:     General: No focal deficit present.     Mental Status: She is alert.  Psychiatric:        Mood and Affect: Mood normal.   SH: no tobacco        Assessment & Plan:

## 2020-10-03 NOTE — Assessment & Plan Note (Signed)
She is doing well and CD4 now above 200 (372) and has stopped the Bactrim for prophylaxis.  She will continue with Biktarvy and rtc in 6 months.

## 2020-10-03 NOTE — Assessment & Plan Note (Signed)
Much improved with the iron and will reduce it to once a day.  Energy better.  Will recheck again next visit.

## 2020-10-03 NOTE — Assessment & Plan Note (Signed)
Looks c/w impetigo.  Will use muporocin topically twice a day.

## 2020-11-21 ENCOUNTER — Other Ambulatory Visit: Payer: Self-pay | Admitting: Internal Medicine

## 2020-11-21 DIAGNOSIS — B20 Human immunodeficiency virus [HIV] disease: Secondary | ICD-10-CM

## 2020-11-29 ENCOUNTER — Other Ambulatory Visit: Payer: Self-pay

## 2020-11-29 MED ORDER — TRIAMCINOLONE ACETONIDE 0.5 % EX OINT: 1.0000 "application " | TOPICAL_OINTMENT | Freq: Two times a day (BID) | CUTANEOUS | 5 refills | Status: DC

## 2020-11-29 NOTE — Telephone Encounter (Signed)
Patient requesting refill for Kenalog ointment. Refill approved and sent to pharmacy.  Briana [name]

## 2021-01-06 ENCOUNTER — Other Ambulatory Visit: Payer: Self-pay | Admitting: Internal Medicine

## 2021-01-06 DIAGNOSIS — B20 Human immunodeficiency virus [HIV] disease: Secondary | ICD-10-CM

## 2021-02-01 ENCOUNTER — Other Ambulatory Visit: Payer: Self-pay

## 2021-02-01 DIAGNOSIS — B20 Human immunodeficiency virus [HIV] disease: Secondary | ICD-10-CM

## 2021-02-01 MED ORDER — BICTEGRAVIR-EMTRICITAB-TENOFOV 50-200-25 MG PO TABS: 1.0000 | ORAL_TABLET | Freq: Every day | ORAL | 3 refills | Status: DC

## 2021-04-02 ENCOUNTER — Other Ambulatory Visit: Payer: Managed Care, Other (non HMO)

## 2021-04-16 ENCOUNTER — Encounter: Payer: Managed Care, Other (non HMO) | Admitting: Internal Medicine

## 2021-04-29 ENCOUNTER — Other Ambulatory Visit: Payer: Managed Care, Other (non HMO)

## 2021-04-30 ENCOUNTER — Other Ambulatory Visit: Payer: Self-pay

## 2021-04-30 ENCOUNTER — Other Ambulatory Visit (HOSPITAL_COMMUNITY)
Admission: RE | Admit: 2021-04-30 | Discharge: 2021-04-30 | Disposition: A | Discharge status: Home / Self Care | Payer: Managed Care, Other (non HMO) | Source: Ambulatory Visit | Attending: Internal Medicine | Admitting: Internal Medicine

## 2021-04-30 ENCOUNTER — Other Ambulatory Visit: Payer: Managed Care, Other (non HMO)

## 2021-04-30 DIAGNOSIS — Z113 Encounter for screening for infections with a predominantly sexual mode of transmission: Secondary | ICD-10-CM | POA: Insufficient documentation

## 2021-04-30 DIAGNOSIS — B20 Human immunodeficiency virus [HIV] disease: Secondary | ICD-10-CM

## 2021-04-30 DIAGNOSIS — Z79899 Other long term (current) drug therapy: Secondary | ICD-10-CM

## 2021-05-01 LAB — URINE CYTOLOGY ANCILLARY ONLY
Chlamydia: NEGATIVE
Comment: NEGATIVE
Comment: NORMAL
Neisseria Gonorrhea: NEGATIVE

## 2021-05-01 LAB — T-HELPER CELL (CD4) - (RCID CLINIC ONLY)
CD4 % Helper T Cell: 20 % — ABNORMAL LOW (ref 33–65)
CD4 T Cell Abs: 423 /uL (ref 400–1790)

## 2021-05-02 LAB — COMPLETE METABOLIC PANEL WITH GFR
AG Ratio: 0.8 (calc) — ABNORMAL LOW (ref 1.0–2.5)
ALT: 12 U/L (ref 6–29)
AST: 13 U/L (ref 10–30)
Albumin: 3.9 g/dL (ref 3.6–5.1)
Alkaline phosphatase (APISO): 190 U/L — ABNORMAL HIGH (ref 31–125)
BUN: 9 mg/dL (ref 7–25)
CO2: 21 mmol/L (ref 20–32)
Calcium: 10.1 mg/dL (ref 8.6–10.2)
Chloride: 107 mmol/L (ref 98–110)
Creat: 0.52 mg/dL (ref 0.50–0.99)
Globulin: 4.8 g/dL (calc) — ABNORMAL HIGH (ref 1.9–3.7)
Glucose, Bld: 83 mg/dL (ref 65–99)
Potassium: 4.2 mmol/L (ref 3.5–5.3)
Sodium: 138 mmol/L (ref 135–146)
Total Bilirubin: 0.3 mg/dL (ref 0.2–1.2)
Total Protein: 8.7 g/dL — ABNORMAL HIGH (ref 6.1–8.1)
eGFR: 119 mL/min/{1.73_m2} (ref >=60)

## 2021-05-02 LAB — CBC WITH DIFFERENTIAL/PLATELET
Absolute Monocytes: 562 cells/uL (ref 200–950)
Basophils Absolute: 31 cells/uL (ref 0–200)
Basophils Relative: 0.3 %
Eosinophils Absolute: 385 cells/uL (ref 15–500)
Eosinophils Relative: 3.7 %
HCT: 36.3 % (ref 35.0–45.0)
Hemoglobin: 11 g/dL — ABNORMAL LOW (ref 11.7–15.5)
Lymphs Abs: 2226 cells/uL (ref 850–3900)
MCH: 22.2 pg — ABNORMAL LOW (ref 27.0–33.0)
MCHC: 30.3 g/dL — ABNORMAL LOW (ref 32.0–36.0)
MCV: 73.2 fL — ABNORMAL LOW (ref 80.0–100.0)
MPV: 10.2 fL (ref 7.5–12.5)
Monocytes Relative: 5.4 %
Neutro Abs: 7197 cells/uL (ref 1500–7800)
Neutrophils Relative %: 69.2 %
Platelets: 309 10*3/uL (ref 140–400)
RBC: 4.96 10*6/uL (ref 3.80–5.10)
RDW: 14.1 % (ref 11.0–15.0)
Total Lymphocyte: 21.4 %
WBC: 10.4 10*3/uL (ref 3.8–10.8)

## 2021-05-02 LAB — HIV-1 RNA QUANT-NO REFLEX-BLD
HIV 1 RNA Quant: <20 Copies/mL — ABNORMAL HIGH
HIV-1 RNA Quant, Log: <1.3 Log cps/mL — ABNORMAL HIGH

## 2021-05-02 LAB — LIPID PANEL
Cholesterol: 162 mg/dL (ref <200)
HDL: 49 mg/dL — ABNORMAL LOW (ref >=50)
LDL Cholesterol (Calc): 94 mg/dL (calc)
Non-HDL Cholesterol (Calc): 113 mg/dL (calc) (ref <130)
Total CHOL/HDL Ratio: 3.3 (calc) (ref <5.0)
Triglycerides: 93 mg/dL (ref <150)

## 2021-05-02 LAB — RPR: RPR Ser Ql: NONREACTIVE

## 2021-05-13 ENCOUNTER — Encounter: Payer: Managed Care, Other (non HMO) | Admitting: Internal Medicine

## 2021-05-15 ENCOUNTER — Encounter: Payer: Managed Care, Other (non HMO) | Admitting: Internal Medicine

## 2021-06-02 ENCOUNTER — Other Ambulatory Visit: Payer: Self-pay | Admitting: Internal Medicine

## 2021-06-02 DIAGNOSIS — B20 Human immunodeficiency virus [HIV] disease: Secondary | ICD-10-CM

## 2021-06-12 ENCOUNTER — Ambulatory Visit: Payer: Managed Care, Other (non HMO) | Admitting: Internal Medicine

## 2021-06-18 ENCOUNTER — Other Ambulatory Visit: Payer: Self-pay | Admitting: Internal Medicine

## 2021-07-10 ENCOUNTER — Other Ambulatory Visit: Payer: Self-pay | Admitting: Internal Medicine

## 2021-07-10 DIAGNOSIS — B20 Human immunodeficiency virus [HIV] disease: Secondary | ICD-10-CM

## 2021-08-07 ENCOUNTER — Telehealth: Payer: Self-pay

## 2021-08-07 DIAGNOSIS — B20 Human immunodeficiency virus [HIV] disease: Secondary | ICD-10-CM

## 2021-08-07 MED ORDER — BIKTARVY 50-200-25 MG PO TABS
ORAL_TABLET | ORAL | 0 refills | Status: DC
Start: 2021-08-07 — End: 2021-09-02

## 2021-08-07 NOTE — Telephone Encounter (Signed)
Received faxed refill request for Biktarvy, patient is overdue for follow up. Called to schedule, no answer. Left HIPAA compliant voicemail requesting callback.   Beryle Flock, RN

## 2021-08-16 ENCOUNTER — Emergency Department (HOSPITAL_BASED_OUTPATIENT_CLINIC_OR_DEPARTMENT_OTHER)
Admission: EM | Admit: 2021-08-16 | Discharge: 2021-08-17 | Disposition: A | Discharge status: Home / Self Care | Payer: Managed Care, Other (non HMO) | Attending: Emergency Medicine | Admitting: Emergency Medicine

## 2021-08-16 ENCOUNTER — Encounter (HOSPITAL_BASED_OUTPATIENT_CLINIC_OR_DEPARTMENT_OTHER): Payer: Self-pay | Admitting: *Deleted

## 2021-08-16 ENCOUNTER — Emergency Department (HOSPITAL_BASED_OUTPATIENT_CLINIC_OR_DEPARTMENT_OTHER): Payer: Managed Care, Other (non HMO)

## 2021-08-16 ENCOUNTER — Other Ambulatory Visit: Payer: Self-pay

## 2021-08-16 DIAGNOSIS — J189 Pneumonia, unspecified organism: Secondary | ICD-10-CM | POA: Insufficient documentation

## 2021-08-16 DIAGNOSIS — R0981 Nasal congestion: Secondary | ICD-10-CM | POA: Diagnosis present

## 2021-08-16 DIAGNOSIS — Z21 Asymptomatic human immunodeficiency virus [HIV] infection status: Secondary | ICD-10-CM | POA: Insufficient documentation

## 2021-08-16 DIAGNOSIS — Z20822 Contact with and (suspected) exposure to covid-19: Secondary | ICD-10-CM | POA: Diagnosis not present

## 2021-08-16 DIAGNOSIS — N189 Chronic kidney disease, unspecified: Secondary | ICD-10-CM | POA: Insufficient documentation

## 2021-08-16 DIAGNOSIS — J09X1 Influenza due to identified novel influenza A virus with pneumonia: Secondary | ICD-10-CM | POA: Diagnosis not present

## 2021-08-16 LAB — CBC WITH DIFFERENTIAL/PLATELET
Abs Immature Granulocytes: 0.09 10*3/uL — ABNORMAL HIGH (ref 0.00–0.07)
Basophils Absolute: 0 10*3/uL (ref 0.0–0.1)
Basophils Relative: 0 %
Eosinophils Absolute: 0.3 10*3/uL (ref 0.0–0.5)
Eosinophils Relative: 2 %
HCT: 37.4 % (ref 36.0–46.0)
Hemoglobin: 11.5 g/dL — ABNORMAL LOW (ref 12.0–15.0)
Immature Granulocytes: 1 %
Lymphocytes Relative: 16 %
Lymphs Abs: 2.7 10*3/uL (ref 0.7–4.0)
MCH: 22.6 pg — ABNORMAL LOW (ref 26.0–34.0)
MCHC: 30.7 g/dL (ref 30.0–36.0)
MCV: 73.5 fL — ABNORMAL LOW (ref 80.0–100.0)
Monocytes Absolute: 1.8 10*3/uL — ABNORMAL HIGH (ref 0.1–1.0)
Monocytes Relative: 11 %
Neutro Abs: 12 10*3/uL — ABNORMAL HIGH (ref 1.7–7.7)
Neutrophils Relative %: 70 %
Platelets: 348 10*3/uL (ref 150–400)
RBC: 5.09 MIL/uL (ref 3.87–5.11)
RDW: 14.5 % (ref 11.5–15.5)
WBC: 17 10*3/uL — ABNORMAL HIGH (ref 4.0–10.5)
nRBC: 0 % (ref 0.0–0.2)

## 2021-08-16 LAB — LACTIC ACID, PLASMA: Lactic Acid, Venous: 1.4 mmol/L (ref 0.5–1.9)

## 2021-08-16 LAB — BASIC METABOLIC PANEL
Anion gap: 12 (ref 5–15)
BUN: 12 mg/dL (ref 6–20)
CO2: 21 mmol/L — ABNORMAL LOW (ref 22–32)
Calcium: 9.4 mg/dL (ref 8.9–10.3)
Chloride: 102 mmol/L (ref 98–111)
Creatinine, Ser: 0.61 mg/dL (ref 0.44–1.00)
GFR, Estimated: >60 mL/min (ref >60)
Glucose, Bld: 110 mg/dL — ABNORMAL HIGH (ref 70–99)
Potassium: 3.1 mmol/L — ABNORMAL LOW (ref 3.5–5.1)
Sodium: 135 mmol/L (ref 135–145)

## 2021-08-16 LAB — D-DIMER, QUANTITATIVE: D-Dimer, Quant: 1.14 ug/mL-FEU — ABNORMAL HIGH (ref 0.00–0.50)

## 2021-08-16 MED ORDER — SODIUM CHLORIDE 0.9 % IV BOLUS
1000.0000 mL | Freq: Once | INTRAVENOUS | Status: AC
  Administered 2021-08-16: 1000 mL via INTRAVENOUS

## 2021-08-16 NOTE — ED Provider Notes (Signed)
[facility] DEPT [facility] Provider Note: Georgena Spurling, MD, FACEP  CSN: [id number] MRN: [id number] ARRIVAL: 08/16/21 at 2242 ROOM: [place]  Nasal Congestion   HISTORY OF PRESENT ILLNESS  08/16/21 11:00 PM Briana [name] is a 45 y.o. female who has had about a week of nasal congestion cough and dyspnea on exertion.  She is not aware of having a fever.  The cough is been severe enough at times to cause posttussive emesis.  She was tested for flu and COVID at work 2 days ago and this was negative.  She denies palpitations but her heart rate was noted to be in the 140s on arrival.  She denies illicit drug use but has been taking TheraFlu for her symptoms.   Past Medical History:  Diagnosis Date   Abnormal Pap smear    s/p colposcopy    Anemia    Chronic kidney disease 03/2013   kidney infection   HIV infection ([place])    Screening for malignant neoplasm of the cervix     Past Surgical History:  Procedure Laterality Date   CESAREAN SECTION     2000/2008   COSMETIC SURGERY     2 previous c-sections   TUBAL LIGATION      Family History  Problem Relation Age of Onset   Eczema Mother    Diabetes Father    Hypertension Father    Diabetes Maternal Grandmother    Hypertension Maternal Grandmother    Diabetes Maternal Grandfather    Hypertension Maternal Grandfather    Diabetes Paternal Grandmother    Hypertension Paternal Grandmother    Diabetes Paternal Grandfather    Hypertension Paternal Grandfather     Social History   Tobacco Use   Smoking status: Never   Smokeless tobacco: Never  Vaping Use   Vaping Use: Never used  Substance Use Topics   Alcohol use: No   Drug use: No    Prior to Admission medications   Medication Sig Start Date End Date Taking? Authorizing Provider  doxycycline (VIBRAMYCIN) 100 MG capsule Take 1 capsule (100 mg total) by mouth 2 (two) times daily. One po bid x 7 days 08/17/21  Yes Biddie Sebek, MD   bictegravir-emtricitabine-tenofovir AF (BIKTARVY) 50-200-25 MG TABS tablet TAKE 1 TABLET DAILY (ADDITIONAL REFILLS AT 10/12 APPOINTMENT) 08/07/21   Comer, Okey Regal, MD  famotidine (PEPCID) 20 MG tablet  11/28/16   [provider]  ferrous sulfate 325 (65 FE) MG tablet Take 1 tablet (325 mg total) by mouth daily with breakfast. 10/03/20   Comer, Okey Regal, MD  triamcinolone ointment (KENALOG) 0.5 % Apply 1 application topically 2 (two) times daily. As needed 11/29/20   Thayer Headings, MD  vitamin B-12 100 MCG tablet Take 1 tablet (100 mcg total) by mouth daily. 11/28/16   Regalado, Jerald Kief A, MD    Allergies Pumpkin flavor   REVIEW OF SYSTEMS  Negative except as noted here or in the History of Present Illness.   PHYSICAL EXAMINATION  Initial Vital Signs Blood pressure (!) 157/91, pulse (!) 147, temperature 99.8 F (37.7 C), temperature source Oral, resp. rate 20, height 5\' 4"  (1.626 m), weight 81.6 kg, last menstrual period 08/09/2021, SpO2 95 %.  Examination General: Well-developed, well-nourished female in no acute distress; appearance consistent with age of record HENT: normocephalic; atraumatic Eyes: pupils equal, round and reactive to light; extraocular muscles intact Neck: supple Heart: regular rate and rhythm; tachycardia Lungs: Mild coarse sounds throughout Abdomen: soft; nondistended;  nontender; bowel sounds present Extremities: No deformity; full range of motion; pulses normal Neurologic: Awake, alert and oriented; motor function intact in all extremities and symmetric; no facial droop Skin: Warm and dry Psychiatric: Normal mood and affect   RESULTS  Summary of this visit's results, reviewed and interpreted by myself:   EKG Interpretation  Date/Time:  Friday August 16 2021 22:59:53 EST Ventricular Rate:  150 PR Interval:  128 QRS Duration: 81 QT Interval:  264 QTC Calculation: 417 R Axis:   63 Text Interpretation: Sinus tachycardia LAE, consider biatrial  enlargement Left ventricular hypertrophy Anterior ST elevation, probably due to LVH Rate is faster Confirmed by Shenise Wolgamott, Jenny Reichmann [phone]) on 08/16/2021 11:01:28 PM       Laboratory Studies: Results for orders placed or performed during the hospital encounter of 08/16/21 (from the past 24 hour(s))  Urinalysis, Routine w reflex microscopic     Status: Abnormal   Collection Time: 08/16/21 11:23 PM  Result Value Ref Range   Color, Urine YELLOW YELLOW   APPearance CLEAR CLEAR   Specific Gravity, Urine >=1.030 1.005 - 1.030   pH 6.0 5.0 - 8.0   Glucose, UA NEGATIVE NEGATIVE mg/dL   Hgb urine dipstick TRACE (A) NEGATIVE   Bilirubin Urine MODERATE (A) NEGATIVE   Ketones, ur NEGATIVE NEGATIVE mg/dL   Protein, ur 100 (A) NEGATIVE mg/dL   Nitrite NEGATIVE NEGATIVE   Leukocytes,Ua NEGATIVE NEGATIVE  Pregnancy, urine     Status: None   Collection Time: 08/16/21 11:23 PM  Result Value Ref Range   Preg Test, Ur NEGATIVE NEGATIVE  CBC with Differential/Platelet     Status: Abnormal   Collection Time: 08/16/21 11:23 PM  Result Value Ref Range   WBC 17.0 (H) 4.0 - 10.5 K/uL   RBC 5.09 3.87 - 5.11 MIL/uL   Hemoglobin 11.5 (L) 12.0 - 15.0 g/dL   HCT 37.4 36.0 - 46.0 %   MCV 73.5 (L) 80.0 - 100.0 fL   MCH 22.6 (L) 26.0 - 34.0 pg   MCHC 30.7 30.0 - 36.0 g/dL   RDW 14.5 11.5 - 15.5 %   Platelets 348 150 - 400 K/uL   nRBC 0.0 0.0 - 0.2 %   Neutrophils Relative % 70 %   Neutro Abs 12.0 (H) 1.7 - 7.7 K/uL   Lymphocytes Relative 16 %   Lymphs Abs 2.7 0.7 - 4.0 K/uL   Monocytes Relative 11 %   Monocytes Absolute 1.8 (H) 0.1 - 1.0 K/uL   Eosinophils Relative 2 %   Eosinophils Absolute 0.3 0.0 - 0.5 K/uL   Basophils Relative 0 %   Basophils Absolute 0.0 0.0 - 0.1 K/uL   Immature Granulocytes 1 %   Abs Immature Granulocytes 0.09 (H) 0.00 - 0.07 K/uL  Basic metabolic panel     Status: Abnormal   Collection Time: 08/16/21 11:23 PM  Result Value Ref Range   Sodium 135 135 - 145 mmol/L   Potassium 3.1  (L) 3.5 - 5.1 mmol/L   Chloride 102 98 - 111 mmol/L   CO2 21 (L) 22 - 32 mmol/L   Glucose, Bld 110 (H) 70 - 99 mg/dL   BUN 12 6 - 20 mg/dL   Creatinine, Ser 0.61 0.44 - 1.00 mg/dL   Calcium 9.4 8.9 - 10.3 mg/dL   GFR, Estimated >60 >60 mL/min   Anion gap 12 5 - 15  D-dimer, quantitative     Status: Abnormal   Collection Time: 08/16/21 11:23 PM  Result Value Ref Range  D-Dimer, Quant 1.14 (H) 0.00 - 0.50 ug/mL-FEU  Lactic acid, plasma     Status: None   Collection Time: 08/16/21 11:23 PM  Result Value Ref Range   Lactic Acid, Venous 1.4 0.5 - 1.9 mmol/L  Resp Panel by RT-PCR (Flu A&B, Covid) Nasopharyngeal Swab     Status: Abnormal   Collection Time: 08/16/21 11:23 PM   Specimen: Nasopharyngeal Swab; Nasopharyngeal(NP) swabs in vial transport medium  Result Value Ref Range   SARS Coronavirus 2 by RT PCR NEGATIVE NEGATIVE   Influenza A by PCR POSITIVE (A) NEGATIVE   Influenza B by PCR NEGATIVE NEGATIVE  Urinalysis, Microscopic (reflex)     Status: Abnormal   Collection Time: 08/16/21 11:23 PM  Result Value Ref Range   RBC / HPF 0-5 0 - 5 RBC/hpf   WBC, UA 0-5 0 - 5 WBC/hpf   Bacteria, UA RARE (A) NONE SEEN   Squamous Epithelial / LPF 0-5 0 - 5   Mucus PRESENT    Hyaline Casts, UA PRESENT    Urine-Other LESS THAN 10 mL OF URINE SUBMITTED    Imaging Studies: DG Chest 2 View  Result Date: 08/16/2021 CLINICAL DATA:  Cough. EXAM: CHEST - 2 VIEW COMPARISON:  Chest x-ray 11/25/2016. FINDINGS: There are minimal patchy airspace opacities in the lower lobes, left greater than right. There is no pleural effusion or pneumothorax. Cardiomediastinal silhouette is within normal limits. No acute fractures. IMPRESSION: 1. Minimal bibasilar airspace, left greater than right, disease worrisome for infection. Electronically Signed   By: Ronney Asters M.D.   On: 08/16/2021 23:41    ED COURSE and MDM  Nursing notes, initial and subsequent vitals signs, including pulse oximetry, reviewed and  interpreted by myself.  Vitals:   08/17/21 0000 08/17/21 0015 08/17/21 0030 08/17/21 0045  BP: 132/90 (!) 129/95 139/86 109/79  Pulse: (!) 132 (!) 135 (!) 127   Resp: 18 (!) 34 (!) 34 (!) 35  Temp:      TempSrc:      SpO2: 97% (!) 87% 100%   Weight:      Height:       Medications  albuterol (VENTOLIN HFA) 108 (90 Base) MCG/ACT inhaler 2 puff (2 puffs Inhalation Given 08/17/21 0058)  potassium chloride SA (KLOR-CON M) CR tablet 40 mEq (has no administration in time range)  doxycycline (VIBRA-TABS) tablet 100 mg (has no administration in time range)  sodium chloride 0.9 % bolus 1,000 mL (0 mLs Intravenous Stopped 08/17/21 0058)   12:11 AM Positive for influenza A which is likely the cause of her symptoms and bibasilar airspace disease.  Her urine is consistent with dehydration and we will hydrate her in the ED.  She acknowledges that she has not kept her self well-hydrated recently.  1:11 AM Air movement improved and lungs clear after albuterol.  Patient given albuterol and AeroChamber and instructed in their use.  We will start her on doxycycline to treat possible secondary pneumonia given her infiltrates.  She is outside the window for Tamiflu.  Her most recent CD4 count (04/30/21) was 423.   PROCEDURES  Procedures   ED DIAGNOSES     ICD-10-CM   1. Influenza A with pneumonia  J09.Heloise Purpura, MD 08/17/21 [phone]

## 2021-08-16 NOTE — ED Notes (Signed)
Patient transported to X-ray 

## 2021-08-16 NOTE — ED Triage Notes (Signed)
C/o nasal congestion cough and nausea x 1 week

## 2021-08-17 LAB — URINALYSIS, ROUTINE W REFLEX MICROSCOPIC
Glucose, UA: NEGATIVE mg/dL
Ketones, ur: NEGATIVE mg/dL
Leukocytes,Ua: NEGATIVE
Nitrite: NEGATIVE
Protein, ur: 100 mg/dL — AB
Specific Gravity, Urine: >=1.03 (ref 1.005–1.030)
pH: 6 (ref 5.0–8.0)

## 2021-08-17 LAB — URINALYSIS, MICROSCOPIC (REFLEX)

## 2021-08-17 LAB — RESP PANEL BY RT-PCR (FLU A&B, COVID) ARPGX2
Influenza A by PCR: POSITIVE — AB
Influenza B by PCR: NEGATIVE
SARS Coronavirus 2 by RT PCR: NEGATIVE

## 2021-08-17 LAB — PREGNANCY, URINE: Preg Test, Ur: NEGATIVE

## 2021-08-17 MED ORDER — ALBUTEROL SULFATE HFA 108 (90 BASE) MCG/ACT IN AERS
2.0000 | INHALATION_SPRAY | RESPIRATORY_TRACT | Status: DC | PRN
  Administered 2021-08-17: 2 via RESPIRATORY_TRACT
  Filled 2021-08-17: qty 6.7

## 2021-08-17 MED ORDER — DOXYCYCLINE HYCLATE 100 MG PO CAPS: 100.0000 mg | ORAL_CAPSULE | Freq: Two times a day (BID) | ORAL | 0 refills | Status: DC

## 2021-08-17 MED ORDER — DOXYCYCLINE HYCLATE 100 MG PO TABS
100.0000 mg | ORAL_TABLET | Freq: Once | ORAL | Status: AC
  Administered 2021-08-17: 100 mg via ORAL
  Filled 2021-08-17: qty 1

## 2021-08-17 MED ORDER — POTASSIUM CHLORIDE CRYS ER 20 MEQ PO TBCR
40.0000 meq | EXTENDED_RELEASE_TABLET | Freq: Once | ORAL | Status: AC
  Administered 2021-08-17: 40 meq via ORAL
  Filled 2021-08-17: qty 2

## 2021-08-17 NOTE — ED Notes (Signed)
Concerns regarding pt's VS: tachycardia and tachypnea discussed with Dr. Florina Ou prior to removing IV for discharge. EDP cleared pt for discharge.

## 2021-08-17 NOTE — ED Notes (Signed)
Discharge instructions discussed with pt. Pt verbalized understanding with no questions at this time. Pt to go home with daughter.

## 2021-08-17 NOTE — Progress Notes (Signed)
RN gave albuterol treatment

## 2021-08-23 ENCOUNTER — Inpatient Hospital Stay (HOSPITAL_COMMUNITY)
Admission: EM | Admit: 2021-08-23 | Discharge: 2021-08-26 | DRG: 682 | Disposition: A | Discharge status: Home / Self Care | Payer: Managed Care, Other (non HMO) | Attending: Internal Medicine | Admitting: Internal Medicine

## 2021-08-23 ENCOUNTER — Inpatient Hospital Stay (HOSPITAL_COMMUNITY): Payer: Managed Care, Other (non HMO)

## 2021-08-23 ENCOUNTER — Emergency Department (HOSPITAL_COMMUNITY): Payer: Managed Care, Other (non HMO)

## 2021-08-23 ENCOUNTER — Other Ambulatory Visit: Payer: Self-pay

## 2021-08-23 DIAGNOSIS — Z21 Asymptomatic human immunodeficiency virus [HIV] infection status: Secondary | ICD-10-CM | POA: Diagnosis present

## 2021-08-23 DIAGNOSIS — E871 Hypo-osmolality and hyponatremia: Secondary | ICD-10-CM | POA: Diagnosis present

## 2021-08-23 DIAGNOSIS — D509 Iron deficiency anemia, unspecified: Secondary | ICD-10-CM | POA: Diagnosis present

## 2021-08-23 DIAGNOSIS — E872 Acidosis, unspecified: Secondary | ICD-10-CM | POA: Diagnosis present

## 2021-08-23 DIAGNOSIS — Z833 Family history of diabetes mellitus: Secondary | ICD-10-CM | POA: Diagnosis not present

## 2021-08-23 DIAGNOSIS — R0682 Tachypnea, not elsewhere classified: Secondary | ICD-10-CM | POA: Diagnosis present

## 2021-08-23 DIAGNOSIS — J129 Viral pneumonia, unspecified: Secondary | ICD-10-CM | POA: Diagnosis present

## 2021-08-23 DIAGNOSIS — E0590 Thyrotoxicosis, unspecified without thyrotoxic crisis or storm: Secondary | ICD-10-CM | POA: Diagnosis present

## 2021-08-23 DIAGNOSIS — E860 Dehydration: Secondary | ICD-10-CM | POA: Diagnosis present

## 2021-08-23 DIAGNOSIS — Z91018 Allergy to other foods: Secondary | ICD-10-CM

## 2021-08-23 DIAGNOSIS — Z6829 Body mass index (BMI) 29.0-29.9, adult: Secondary | ICD-10-CM | POA: Diagnosis not present

## 2021-08-23 DIAGNOSIS — Z20822 Contact with and (suspected) exposure to covid-19: Secondary | ICD-10-CM | POA: Diagnosis present

## 2021-08-23 DIAGNOSIS — N179 Acute kidney failure, unspecified: Secondary | ICD-10-CM | POA: Diagnosis present

## 2021-08-23 DIAGNOSIS — Z8249 Family history of ischemic heart disease and other diseases of the circulatory system: Secondary | ICD-10-CM

## 2021-08-23 DIAGNOSIS — Z79899 Other long term (current) drug therapy: Secondary | ICD-10-CM | POA: Diagnosis not present

## 2021-08-23 LAB — CBC
HCT: 42 % (ref 36.0–46.0)
HCT: 30.4 % — ABNORMAL LOW (ref 36.0–46.0)
Hemoglobin: 12.2 g/dL (ref 12.0–15.0)
Hemoglobin: 9.4 g/dL — ABNORMAL LOW (ref 12.0–15.0)
MCH: 22.1 pg — ABNORMAL LOW (ref 26.0–34.0)
MCH: 23 pg — ABNORMAL LOW (ref 26.0–34.0)
MCHC: 29 g/dL — ABNORMAL LOW (ref 30.0–36.0)
MCHC: 30.9 g/dL (ref 30.0–36.0)
MCV: 76.2 fL — ABNORMAL LOW (ref 80.0–100.0)
MCV: 74.5 fL — ABNORMAL LOW (ref 80.0–100.0)
Platelets: 179 10*3/uL (ref 150–400)
Platelets: 94 10*3/uL — ABNORMAL LOW (ref 150–400)
RBC: 5.51 MIL/uL — ABNORMAL HIGH (ref 3.87–5.11)
RBC: 4.08 MIL/uL (ref 3.87–5.11)
RDW: 14.5 % (ref 11.5–15.5)
RDW: 14.7 % (ref 11.5–15.5)
WBC: 5.3 10*3/uL (ref 4.0–10.5)
WBC: 4.2 10*3/uL (ref 4.0–10.5)
nRBC: 0 % (ref 0.0–0.2)
nRBC: 0 % (ref 0.0–0.2)

## 2021-08-23 LAB — URINALYSIS, ROUTINE W REFLEX MICROSCOPIC
Bilirubin Urine: NEGATIVE
Glucose, UA: NEGATIVE mg/dL
Ketones, ur: 15 mg/dL — AB
Leukocytes,Ua: NEGATIVE
Nitrite: NEGATIVE
Protein, ur: NEGATIVE mg/dL
Specific Gravity, Urine: 1.02 (ref 1.005–1.030)
pH: 5.5 (ref 5.0–8.0)

## 2021-08-23 LAB — LIPASE, BLOOD: Lipase: 80 U/L — ABNORMAL HIGH (ref 11–51)

## 2021-08-23 LAB — C DIFFICILE QUICK SCREEN W PCR REFLEX
C Diff antigen: NEGATIVE
C Diff interpretation: NOT DETECTED
C Diff toxin: NEGATIVE

## 2021-08-23 LAB — COMPREHENSIVE METABOLIC PANEL
ALT: 28 U/L (ref 0–44)
AST: 100 U/L — ABNORMAL HIGH (ref 15–41)
Albumin: 3 g/dL — ABNORMAL LOW (ref 3.5–5.0)
Alkaline Phosphatase: 106 U/L (ref 38–126)
Anion gap: 17 — ABNORMAL HIGH (ref 5–15)
BUN: 40 mg/dL — ABNORMAL HIGH (ref 6–20)
CO2: 14 mmol/L — ABNORMAL LOW (ref 22–32)
Calcium: 8.8 mg/dL — ABNORMAL LOW (ref 8.9–10.3)
Chloride: 98 mmol/L (ref 98–111)
Creatinine, Ser: 1.87 mg/dL — ABNORMAL HIGH (ref 0.44–1.00)
GFR, Estimated: 34 mL/min — ABNORMAL LOW (ref >60)
Glucose, Bld: 118 mg/dL — ABNORMAL HIGH (ref 70–99)
Potassium: 3.7 mmol/L (ref 3.5–5.1)
Sodium: 129 mmol/L — ABNORMAL LOW (ref 135–145)
Total Bilirubin: 0.8 mg/dL (ref 0.3–1.2)
Total Protein: 9.8 g/dL — ABNORMAL HIGH (ref 6.5–8.1)

## 2021-08-23 LAB — CREATININE, SERUM
Creatinine, Ser: 1.29 mg/dL — ABNORMAL HIGH (ref 0.44–1.00)
GFR, Estimated: 53 mL/min — ABNORMAL LOW (ref >60)

## 2021-08-23 LAB — I-STAT BETA HCG BLOOD, ED (MC, WL, AP ONLY): I-stat hCG, quantitative: <5 m[IU]/mL (ref <5)

## 2021-08-23 LAB — URINALYSIS, MICROSCOPIC (REFLEX): Bacteria, UA: NONE SEEN

## 2021-08-23 LAB — RESP PANEL BY RT-PCR (FLU A&B, COVID) ARPGX2
Influenza A by PCR: NEGATIVE
Influenza B by PCR: NEGATIVE
SARS Coronavirus 2 by RT PCR: NEGATIVE

## 2021-08-23 MED ORDER — SODIUM CHLORIDE 0.9 % IV BOLUS
1000.0000 mL | Freq: Once | INTRAVENOUS | Status: AC
  Administered 2021-08-23: 20:00:00 1000 mL via INTRAVENOUS

## 2021-08-23 MED ORDER — SODIUM BICARBONATE 650 MG PO TABS
650.0000 mg | ORAL_TABLET | Freq: Two times a day (BID) | ORAL | Status: AC
  Administered 2021-08-24 – 2021-08-25 (×3): 650 mg via ORAL
  Filled 2021-08-23 (×4): qty 1

## 2021-08-23 MED ORDER — ENOXAPARIN SODIUM 30 MG/0.3ML IJ SOSY
30.0000 mg | PREFILLED_SYRINGE | INTRAMUSCULAR | Status: DC
  Administered 2021-08-23: 21:00:00 30 mg via SUBCUTANEOUS
  Filled 2021-08-23: qty 0.3

## 2021-08-23 MED ORDER — IPRATROPIUM-ALBUTEROL 0.5-2.5 (3) MG/3ML IN SOLN
3.0000 mL | Freq: Once | RESPIRATORY_TRACT | Status: AC
  Administered 2021-08-23: 20:00:00 3 mL via RESPIRATORY_TRACT
  Filled 2021-08-23: qty 3

## 2021-08-23 MED ORDER — LACTATED RINGERS IV SOLN: INTRAVENOUS | Status: AC

## 2021-08-23 MED ORDER — LACTATED RINGERS IV BOLUS
1000.0000 mL | Freq: Once | INTRAVENOUS | Status: AC
  Administered 2021-08-23: 16:00:00 1000 mL via INTRAVENOUS

## 2021-08-23 MED ORDER — SODIUM CHLORIDE 0.9 % IV BOLUS
1000.0000 mL | Freq: Once | INTRAVENOUS | Status: AC
  Administered 2021-08-23: 19:00:00 1000 mL via INTRAVENOUS

## 2021-08-23 MED ORDER — IPRATROPIUM-ALBUTEROL 0.5-2.5 (3) MG/3ML IN SOLN
RESPIRATORY_TRACT | Status: AC
  Filled 2021-08-23: qty 3

## 2021-08-23 MED ORDER — GUAIFENESIN ER 600 MG PO TB12
600.0000 mg | ORAL_TABLET | Freq: Two times a day (BID) | ORAL | Status: DC
  Administered 2021-08-23 – 2021-08-26 (×6): 600 mg via ORAL
  Filled 2021-08-23 (×7): qty 1

## 2021-08-23 MED ORDER — ACETAMINOPHEN 325 MG PO TABS
650.0000 mg | ORAL_TABLET | Freq: Four times a day (QID) | ORAL | Status: DC | PRN
  Administered 2021-08-23 – 2021-08-24 (×2): 650 mg via ORAL
  Filled 2021-08-23 (×3): qty 2

## 2021-08-23 NOTE — ED Triage Notes (Signed)
Pt. Stated, Briana [name] been sick since Dec. 16, I went to UC in [facility] and they gave me some antibiotics and something for throwing up/ All of that has not helped.

## 2021-08-23 NOTE — ED Provider Notes (Signed)
Emergency Medicine Provider Triage Evaluation Note  Briana [name] , a 45 y.o. female  was evaluated in triage.  Pt complains of fever, shortness of breath, lack of appetite, vomiting of 1 week duration.  Patient was seen at [facility] diagnosed with flu and treated for potential pneumonia.  Patient reports she was not able to complete her antibiotic course.  She denies chest pain.  She reports adequate hydration.  Review of Systems  Positive: As above Negative: Chest pain  Physical Exam  BP (!) 172/80    Pulse (!) 141    Temp 99 F (37.2 C) (Oral)    Resp (!) 30    LMP 08/09/2021    SpO2 99%  Gen:   Awake, no distress   Resp:  Normal effort  MSK:   Moves extremities without difficulty  Other:    Medical Decision Making  Medically screening exam initiated at 11:17 AM.  Appropriate orders placed.  Fuller Mandril was informed that the remainder of the evaluation will be completed by another provider, this initial triage assessment does not replace that evaluation, and the importance of remaining in the ED until their evaluation is complete.     Evlyn Courier, PA-C 08/23/21 1118    Carmin Muskrat, MD 08/23/21 1415

## 2021-08-23 NOTE — H&P (Addendum)
History and Physical    Briana [name] DOB: 1976/05/16 DOA: 08/23/2021  PCP: Patient, No Pcp Per (Inactive) Patient coming from: home  I have personally briefly reviewed patient's old medical records in [place]  Chief Complaint: diarrhea, has been sick since 12/16  HPI: Briana [name] is a 45 y.o. female with medical history significant of h/o HIV on HARRT therapy presented to the ED due to above complaints.  Report was seen at urgent care on 08/16/2021, was treated for flu and pneumonia, received antibiotics.  report has been sick since 12/16, with nausea /vomiting and significant diarrhea, she has not been able to keep anything down, She cannot finish antibiotic due to nausea vomiting , report productive cough, denies ab pain, no fever, report vomited twice today, had diarrhea 4 times today, denies hematemesis or hematochezia.  Report works at the [employer], denies known sick contact.  ED Course: Blood pressure 120/83, pulse (!) 125, temperature 98.7 F (37.1 C), temperature source Oral, resp. rate (!) 28, last menstrual period 08/09/2021, SpO2 100 %.   Chest x-ray no acute findings EKG sinus tachycardia Labs:  COVID screening labs collected in the ED, result pending There is no leukocytosis Lipase elevated at 80 Sodium 129, CO2 14, BUN 40, creatinine 1.87, AST 100, ALT 28 Negative hCG  UA pending collection C. difficile , GI PCR ordered, pending collection  CT abdomen pelvis ordered  Received 2 L LR, hospitalist called to admit the patient     Review of Systems: As per HPI otherwise all other systems reviewed and are negative.   Past Medical History:  Diagnosis Date   Abnormal Pap smear    s/p colposcopy    Anemia    Chronic kidney disease 03/2013   kidney infection   HIV infection ([place])    Screening for malignant neoplasm of the cervix     Past Surgical History:  Procedure Laterality Date   CESAREAN SECTION     2000/2008   COSMETIC  SURGERY     2 previous c-sections   TUBAL LIGATION      Social History  reports that she has never smoked. She has never used smokeless tobacco. She reports that she does not drink alcohol and does not use drugs.  Allergies  Allergen Reactions   Pumpkin Flavor     Lip swelling after eating pumpkin pie    Family History  Problem Relation Age of Onset   Eczema Mother    Diabetes Father    Hypertension Father    Diabetes Maternal Grandmother    Hypertension Maternal Grandmother    Diabetes Maternal Grandfather    Hypertension Maternal Grandfather    Diabetes Paternal Grandmother    Hypertension Paternal Grandmother    Diabetes Paternal Grandfather    Hypertension Paternal Grandfather     Prior to Admission medications   Medication Sig Start Date End Date Taking? Authorizing Provider  bictegravir-emtricitabine-tenofovir AF (BIKTARVY) 50-200-25 MG TABS tablet TAKE 1 TABLET DAILY (ADDITIONAL REFILLS AT 10/12 APPOINTMENT) 08/07/21  Yes Comer, Okey Regal, MD  doxycycline (VIBRAMYCIN) 100 MG capsule Take 1 capsule (100 mg total) by mouth 2 (two) times daily. One po bid x 7 days Patient not taking: Reported on 08/23/2021 08/17/21   Molpus, John, MD  famotidine (PEPCID) 20 MG tablet  11/28/16   [provider]  ferrous sulfate 325 (65 FE) MG tablet Take 1 tablet (325 mg total) by mouth daily with breakfast. Patient not taking: Reported on 08/23/2021 10/03/20  Thayer Headings, MD  triamcinolone ointment (KENALOG) 0.5 % Apply 1 application topically 2 (two) times daily. As needed Patient not taking: Reported on 08/23/2021 11/29/20   Thayer Headings, MD  vitamin B-12 100 MCG tablet Take 1 tablet (100 mcg total) by mouth daily. Patient not taking: Reported on 08/23/2021 11/28/16   Elmarie Shiley, MD    Physical Exam: Vitals:   08/23/21 1218 08/23/21 1306 08/23/21 1446 08/23/21 1600  BP: (!) 170/77 115/84 110/73 120/83  Pulse: (!) 108 79 (!) 122 (!) 125  Resp: 19 16 17  (!) 28   Temp: 98.7 F (37.1 C) 97.9 F (36.6 C) 97.9 F (36.6 C) 98.7 F (37.1 C)  TempSrc: Oral Oral Oral Oral  SpO2: 100% 95% 99% 100%    Constitutional: appear weak, aaox3 Eyes: PERRL, lids and conjunctivae normal ENMT: Mucous membranes are moist.  Respiratory: some crackles.  Tachypnea ,  No accessory muscle use.  Cardiovascular: Sinus tachycardia, no extremity edema. 2+ pedal pulses.  Abdomen: no tenderness, not distended, Bowel sounds positive.  Musculoskeletal: no clubbing / cyanosis. No joint deformity upper and lower extremities. Good ROM, no contractures. Normal muscle tone.  Skin: no rashes, lesions, ulcers. No induration Neurologic: CN 2-12 grossly intact. Sensation intact, Strength 5/5 in all 4.  Psychiatric: Normal judgment and insight. Alert and oriented x 3. Normal mood.    Labs on Admission: I have personally reviewed following labs and imaging studies  CBC: Recent Labs  Lab 08/16/21 2323 08/23/21 1120  WBC 17.0* 5.3  NEUTROABS 12.0*  --   HGB 11.5* 12.2  HCT 37.4 42.0  MCV 73.5* 76.2*  PLT 348 202    Basic Metabolic Panel: Recent Labs  Lab 08/16/21 2323 08/23/21 1120  NA 135 129*  K 3.1* 3.7  CL 102 98  CO2 21* 14*  GLUCOSE 110* 118*  BUN 12 40*  CREATININE 0.61 1.87*  CALCIUM 9.4 8.8*    GFR: Estimated Creatinine Clearance: 40.1 mL/min (A) (by C-G formula based on SCr of 1.87 mg/dL (H)).  Liver Function Tests: Recent Labs  Lab 08/23/21 1120  AST 100*  ALT 28  ALKPHOS 106  BILITOT 0.8  PROT 9.8*  ALBUMIN 3.0*    Urine analysis:    Component Value Date/Time   COLORURINE YELLOW 08/16/2021 2323   APPEARANCEUR CLEAR 08/16/2021 2323   LABSPEC >=1.030 08/16/2021 2323   PHURINE 6.0 08/16/2021 2323   GLUCOSEU NEGATIVE 08/16/2021 2323   HGBUR TRACE (A) 08/16/2021 2323   BILIRUBINUR MODERATE (A) 08/16/2021 2323   KETONESUR NEGATIVE 08/16/2021 2323   PROTEINUR 100 (A) 08/16/2021 2323   UROBILINOGEN 0.2 05/23/2014 1655   NITRITE NEGATIVE  08/16/2021 2323   LEUKOCYTESUR NEGATIVE 08/16/2021 2323    Radiological Exams on Admission: DG Chest 2 View  Result Date: 08/23/2021 CLINICAL DATA:  Shortness of breath EXAM: CHEST - 2 VIEW COMPARISON:  Chest radiograph 08/16/2021 FINDINGS: The cardiomediastinal silhouette is normal. There is no focal consolidation or pulmonary edema. There is no pleural effusion or pneumothorax There is no acute osseous abnormality. IMPRESSION: No radiographic evidence of acute cardiopulmonary process. Electronically Signed   By: Valetta Mole M.D.   On: 08/23/2021 11:25    EKG: Independently reviewed.  Sinus tachycardia  Assessment/Plan Active Problems:   * No active hospital problems. *   Diarrhea, n/v -4-5 episodes per day, reported was given abxx for pna on 12/16 -check cdiff, gi pcr panel -ct ab/pel no acute abdominal findings -aggressive hydration    AKI -  likely due to dehydration, ua pending - ct did not show obstructive nephropathy  Elevated lft  AST> ALT From acute illness, dehydration?   Ct ab/pel imaging no acute abdominal finding Denies alcohol use Check hepatitis, ck  Elevated lipase, mild CT abdomen pelvis imaging no acute abdominal finding Denies alcohol use Check lipid panel  Hyponatremia Likely from dehydration, getting aggressive IV fluids  Metabolic acidosis from GI loss with AKI  sodium bicarb supplement  Post viral pneumonia? CT abdomen showed tree-in-bud nodularity in bilateral lung bases - order procalcitonin, obtain sputum sample -Start Mucinex for now, need to rule out C. difficile before consider antibiotics   HIV On [place]tar[place] Last CD4 counts was 423 in August 2022   DVT prophylaxis:   Lovenox 30mg  subcu   Code Status:   Full Family Communication:    Patient is from: Home   Anticipated DC to: Home   Anticipated DC date: >24hr    Consults called:  none Admission status:  Inpatient   Severity of Illness:   The appropriate patient status for  this patient is INPATIENT due to history and comorbidities, severity of illness, required intensity of service to ensure the patient's safety and to avoid risk of adverse events/further clinical deterioration.  Severity of illness/comorbidities: Severe dehydration with AKI, multiple labs abnormalities, significant tachycardia Intensity of service: tests, high frequency of surveillance, interventions It is not anticipated that the patient will be medically stable for discharge from the hospital within 2 midnights of admission.    Voice Recognition Viviann Spare dictation system was used to create this note, attempts have been made to correct errors. Please contact the author with questions and/or clarifications.  Florencia Reasons MD PhD FACP [facility]  How to contact the [facility] Attending or Consulting provider [place] or covering provider during after hours [place], for this patient?   Check the care team in [facility] and look for a) attending/consulting [facility] provider listed and b) the [facility] team listed Log into [email or web address] and use [place]'s universal password to access. If you do not have the password, please contact the hospital operator. Locate the [facility] provider you are looking for under [facility] and page to a number that you can be directly reached. If you still have difficulty reaching the provider, please page the [facility] (Director on Call) for the Hospitalists listed on amion for assistance.  08/23/2021, 4:38 PM

## 2021-08-23 NOTE — ED Provider Notes (Signed)
[facility] EMERGENCY DEPARTMENT Provider Note   CSN: [id number] Arrival date & time: 08/23/21  4098     History Chief Complaint  Patient presents with   Emesis   Nausea   Diarrhea    Briana [name] is a 45 y.o. female.  Patient with history of HIV followed by infectious disease, presents with chief complaint of vomiting diarrhea generalized malaise.  Symptoms have been ongoing for the past 12 to 15 days.  She was seen about a week ago at outside facility and diagnosed with influenza.  Her vomiting has stopped but she has had continued diarrhea 4-5 episodes a day.  Presents to the ER for generalized weakness and rapid heart rate.      Past Medical History:  Diagnosis Date   Abnormal Pap smear    s/p colposcopy    Anemia    Chronic kidney disease 03/2013   kidney infection   HIV infection ([place])    Screening for malignant neoplasm of the cervix     Patient Active Problem List   Diagnosis Date Noted   Routine screening for STI (sexually transmitted infection) 10/03/2020   History of long-term treatment with high-risk medication 10/03/2020   Impetigo 10/03/2020   Screening for cervical cancer 05/23/2019   Heavy menstrual bleeding 05/23/2019   Vaginal candidiasis 05/23/2019   Thrombocytosis 11/26/2016   Eczema 09/11/2011   History of abnormal cervical Pap smear 01/24/2011   Iron deficiency anemia 01/23/2011   Human immunodeficiency virus (HIV) disease ([place]) 10/19/2006    Past Surgical History:  Procedure Laterality Date   CESAREAN SECTION     2000/2008   COSMETIC SURGERY     2 previous c-sections   TUBAL LIGATION       OB History     Gravida  3   Para  3   Term  3   Preterm      AB      Living  4      SAB      IAB      Ectopic      Multiple  1   Live Births  3        Obstetric Comments  dsf         Family History  Problem Relation Age of Onset   Eczema Mother    Diabetes Father    Hypertension Father     Diabetes Maternal Grandmother    Hypertension Maternal Grandmother    Diabetes Maternal Grandfather    Hypertension Maternal Grandfather    Diabetes Paternal Grandmother    Hypertension Paternal Grandmother    Diabetes Paternal Grandfather    Hypertension Paternal Grandfather     Social History   Tobacco Use   Smoking status: Never   Smokeless tobacco: Never  Vaping Use   Vaping Use: Never used  Substance Use Topics   Alcohol use: No   Drug use: No    Home Medications Prior to Admission medications   Medication Sig Start Date End Date Taking? Authorizing Provider  bictegravir-emtricitabine-tenofovir AF (BIKTARVY) 50-200-25 MG TABS tablet TAKE 1 TABLET DAILY (ADDITIONAL REFILLS AT 10/12 APPOINTMENT) 08/07/21   Comer, Okey Regal, MD  doxycycline (VIBRAMYCIN) 100 MG capsule Take 1 capsule (100 mg total) by mouth 2 (two) times daily. One po bid x 7 days 08/17/21   Molpus, John, MD  famotidine (PEPCID) 20 MG tablet  11/28/16   [provider]  ferrous sulfate 325 (65 FE) MG tablet Take 1 tablet (  325 mg total) by mouth daily with breakfast. 10/03/20   Comer, Okey Regal, MD  triamcinolone ointment (KENALOG) 0.5 % Apply 1 application topically 2 (two) times daily. As needed 11/29/20   Thayer Headings, MD  vitamin B-12 100 MCG tablet Take 1 tablet (100 mcg total) by mouth daily. 11/28/16   Regalado, Cassie Freer, MD    Allergies    Pumpkin flavor  Review of Systems   Review of Systems  Constitutional:  Negative for fever.  HENT:  Negative for ear pain.   Eyes:  Negative for pain.  Respiratory:  Negative for cough.   Cardiovascular:  Negative for chest pain.  Gastrointestinal:  Positive for diarrhea. Negative for abdominal pain.  Genitourinary:  Negative for flank pain.  Musculoskeletal:  Negative for back pain.  Skin:  Negative for rash.  Neurological:  Negative for headaches.   Physical Exam Updated Vital Signs BP 120/83    Pulse (!) 122    Temp 97.9 F (36.6 C) (Oral)    Resp  (!) 24    LMP 08/09/2021    SpO2 99%   Physical Exam Constitutional:      General: She is not in acute distress.    Appearance: Normal appearance.  HENT:     Head: Normocephalic.     Nose: Nose normal.  Eyes:     Extraocular Movements: Extraocular movements intact.  Cardiovascular:     Rate and Rhythm: Tachycardia present.  Pulmonary:     Effort: Pulmonary effort is normal.  Abdominal:     Tenderness: There is no abdominal tenderness. There is no guarding or rebound.  Musculoskeletal:        General: Normal range of motion.     Cervical back: Normal range of motion.  Neurological:     General: No focal deficit present.     Mental Status: She is alert. Mental status is at baseline.    ED Results / Procedures / Treatments   Labs (all labs ordered are listed, but only abnormal results are displayed) Labs Reviewed  LIPASE, BLOOD - Abnormal; Notable for the following components:      Result Value   Lipase 80 (*)    All other components within normal limits  COMPREHENSIVE METABOLIC PANEL - Abnormal; Notable for the following components:   Sodium 129 (*)    CO2 14 (*)    Glucose, Bld 118 (*)    BUN 40 (*)    Creatinine, Ser 1.87 (*)    Calcium 8.8 (*)    Total Protein 9.8 (*)    Albumin 3.0 (*)    AST 100 (*)    GFR, Estimated 34 (*)    Anion gap 17 (*)    All other components within normal limits  CBC - Abnormal; Notable for the following components:   RBC 5.51 (*)    MCV 76.2 (*)    MCH 22.1 (*)    MCHC 29.0 (*)    All other components within normal limits  RESP PANEL BY RT-PCR (FLU A&B, COVID) ARPGX2  URINALYSIS, ROUTINE W REFLEX MICROSCOPIC  I-STAT BETA HCG BLOOD, ED ([facility], [facility], [facility] ONLY)    EKG None  Radiology DG Chest 2 View  Result Date: 08/23/2021 CLINICAL DATA:  Shortness of breath EXAM: CHEST - 2 VIEW COMPARISON:  Chest radiograph 08/16/2021 FINDINGS: The cardiomediastinal silhouette is normal. There is no focal consolidation or pulmonary edema. There is  no pleural effusion or pneumothorax There is no acute osseous abnormality. IMPRESSION: No  radiographic evidence of acute cardiopulmonary process. Electronically Signed   By: Valetta Mole M.D.   On: 08/23/2021 11:25    Procedures Procedures   Medications Ordered in ED Medications  lactated ringers bolus 1,000 mL (has no administration in time range)  lactated ringers bolus 1,000 mL (has no administration in time range)    ED Course  I have reviewed the triage vital signs and the nursing notes.  Pertinent labs & imaging results that were available during my care of the patient were reviewed by me and considered in my medical decision making (see chart for details).    MDM Rules/Calculators/A&P                         Patient arrives tachycardic and tachypneic.  Blood pressure otherwise within normal limits.  Labs show significant electrolyte abnormalities with low sodium, low calcium.  Appears to have acute kidney injury as well with creatinine of 1.8.  2 L bolus of fluid resuscitation provided.  Consultation with medicine for admission.     Final Clinical Impression(s) / ED Diagnoses Final diagnoses:  None    Rx / DC Orders ED Discharge Orders     None        Luna Fuse, MD 08/23/21 [phone]

## 2021-08-23 NOTE — ED Notes (Addendum)
RRT called and Alcario Drought MD paged for continuing tachycardia and tachypnea.

## 2021-08-23 NOTE — ED Notes (Signed)
Called to initiate purple man process. No answer.

## 2021-08-23 NOTE — ED Notes (Signed)
Back from CT

## 2021-08-23 NOTE — ED Notes (Signed)
Patient transported to CT 

## 2021-08-24 ENCOUNTER — Inpatient Hospital Stay (HOSPITAL_COMMUNITY): Payer: Managed Care, Other (non HMO)

## 2021-08-24 DIAGNOSIS — N179 Acute kidney failure, unspecified: Secondary | ICD-10-CM | POA: Diagnosis not present

## 2021-08-24 LAB — COMPREHENSIVE METABOLIC PANEL
ALT: 24 U/L (ref 0–44)
AST: 85 U/L — ABNORMAL HIGH (ref 15–41)
Albumin: 2.3 g/dL — ABNORMAL LOW (ref 3.5–5.0)
Alkaline Phosphatase: 96 U/L (ref 38–126)
Anion gap: 10 (ref 5–15)
BUN: 32 mg/dL — ABNORMAL HIGH (ref 6–20)
CO2: 17 mmol/L — ABNORMAL LOW (ref 22–32)
Calcium: 8.4 mg/dL — ABNORMAL LOW (ref 8.9–10.3)
Chloride: 104 mmol/L (ref 98–111)
Creatinine, Ser: 1.01 mg/dL — ABNORMAL HIGH (ref 0.44–1.00)
GFR, Estimated: >60 mL/min (ref >60)
Glucose, Bld: 103 mg/dL — ABNORMAL HIGH (ref 70–99)
Potassium: 3.6 mmol/L (ref 3.5–5.1)
Sodium: 131 mmol/L — ABNORMAL LOW (ref 135–145)
Total Bilirubin: 0.8 mg/dL (ref 0.3–1.2)
Total Protein: 7.3 g/dL (ref 6.5–8.1)

## 2021-08-24 LAB — GASTROINTESTINAL PANEL BY PCR, STOOL (REPLACES STOOL CULTURE)

## 2021-08-24 LAB — I-STAT ARTERIAL BLOOD GAS, ED
Acid-base deficit: 5 mmol/L — ABNORMAL HIGH (ref 0.0–2.0)
Bicarbonate: 17 mmol/L — ABNORMAL LOW (ref 20.0–28.0)
Calcium, Ion: 1.15 mmol/L (ref 1.15–1.40)
HCT: 28 % — ABNORMAL LOW (ref 36.0–46.0)
Hemoglobin: 9.5 g/dL — ABNORMAL LOW (ref 12.0–15.0)
O2 Saturation: 100 %
Potassium: 3.4 mmol/L — ABNORMAL LOW (ref 3.5–5.1)
Sodium: 134 mmol/L — ABNORMAL LOW (ref 135–145)
TCO2: 18 mmol/L — ABNORMAL LOW (ref 22–32)
pCO2 arterial: 22.7 mmHg — ABNORMAL LOW (ref 32.0–48.0)
pH, Arterial: 7.482 — ABNORMAL HIGH (ref 7.350–7.450)
pO2, Arterial: 191 mmHg — ABNORMAL HIGH (ref 83.0–108.0)

## 2021-08-24 LAB — LIPID PANEL
Cholesterol: 51 mg/dL (ref 0–200)
HDL: <10 mg/dL — ABNORMAL LOW (ref >40)
Triglycerides: 140 mg/dL (ref <150)
VLDL: 28 mg/dL (ref 0–40)

## 2021-08-24 LAB — CBC
HCT: 33.4 % — ABNORMAL LOW (ref 36.0–46.0)
Hemoglobin: 10.1 g/dL — ABNORMAL LOW (ref 12.0–15.0)
MCH: 22.9 pg — ABNORMAL LOW (ref 26.0–34.0)
MCHC: 30.2 g/dL (ref 30.0–36.0)
MCV: 75.6 fL — ABNORMAL LOW (ref 80.0–100.0)
Platelets: 111 10*3/uL — ABNORMAL LOW (ref 150–400)
RBC: 4.42 MIL/uL (ref 3.87–5.11)
RDW: 14.7 % (ref 11.5–15.5)
WBC: 4.4 10*3/uL (ref 4.0–10.5)
nRBC: 0 % (ref 0.0–0.2)

## 2021-08-24 LAB — HEPATITIS PANEL, ACUTE
HCV Ab: NONREACTIVE
Hep A IgM: NONREACTIVE
Hep B C IgM: NONREACTIVE
Hepatitis B Surface Ag: NONREACTIVE

## 2021-08-24 LAB — CK: Total CK: 107 U/L (ref 38–234)

## 2021-08-24 LAB — PROCALCITONIN: Procalcitonin: 0.45 ng/mL

## 2021-08-24 LAB — LACTIC ACID, PLASMA
Lactic Acid, Venous: 1.6 mmol/L (ref 0.5–1.9)
Lactic Acid, Venous: 1.8 mmol/L (ref 0.5–1.9)

## 2021-08-24 MED ORDER — ENOXAPARIN SODIUM 40 MG/0.4ML IJ SOSY
40.0000 mg | PREFILLED_SYRINGE | INTRAMUSCULAR | Status: DC
  Administered 2021-08-24 – 2021-08-25 (×2): 40 mg via SUBCUTANEOUS
  Filled 2021-08-24 (×2): qty 0.4

## 2021-08-24 MED ORDER — LEVOFLOXACIN IN D5W 750 MG/150ML IV SOLN
750.0000 mg | INTRAVENOUS | Status: DC
  Administered 2021-08-24 – 2021-08-25 (×2): 750 mg via INTRAVENOUS
  Filled 2021-08-24 (×3): qty 150

## 2021-08-24 MED ORDER — LOPERAMIDE HCL 2 MG PO CAPS
2.0000 mg | ORAL_CAPSULE | Freq: Four times a day (QID) | ORAL | Status: DC | PRN
  Administered 2021-08-25: 06:00:00 2 mg via ORAL
  Filled 2021-08-24: qty 1

## 2021-08-24 MED ORDER — LACTATED RINGERS IV SOLN: INTRAVENOUS | Status: AC

## 2021-08-24 MED ORDER — BICTEGRAVIR-EMTRICITAB-TENOFOV 50-200-25 MG PO TABS
1.0000 | ORAL_TABLET | Freq: Every day | ORAL | Status: DC
  Administered 2021-08-25 – 2021-08-26 (×2): 1 via ORAL
  Filled 2021-08-24 (×2): qty 1

## 2021-08-24 NOTE — ED Notes (Signed)
Breakfast tray ordered 

## 2021-08-24 NOTE — ED Notes (Addendum)
Alcario Drought MD notified about concern for high resp rates. New orders to follow.

## 2021-08-24 NOTE — Plan of Care (Signed)

## 2021-08-24 NOTE — Significant Event (Signed)
High RR, not tiring out, doesn't seem like a pending intubation at this time.  Checking: 1) ABG 2) BMP 3) lactate 4) repeat CXR

## 2021-08-24 NOTE — ED Notes (Signed)
Up to [facility] very watery diarrhea . Patient is alert and oriented denies pain.

## 2021-08-24 NOTE — Progress Notes (Signed)
PROGRESS NOTE    TARITA DESHMUKH  BPZ:[id number] DOB: 1975/12/08 DOA: 08/23/2021 PCP: Patient, No Pcp Per (Inactive)    Chief Complaint  Patient presents with   Emesis   Nausea   Diarrhea    Brief Narrative:  MIKALYN HERMIDA is a 45 y.o. female with medical history significant of h/o HIV on HARRT therapy presented to the ED due to N/V/D. Report was seen at urgent care on 08/16/2021, was treated for flu and pneumonia, received antibiotics.  report has been sick since 12/16. Flu and COVID screening negative  Subjective:  She is less tachycardia, less tachypneic , she reports feeling stronger today. Reports n/v has resolved, she tolerated full liquid diet, wants to try regular diet Labs has improved   However, She continues to have diarrhea, denies blood or pus in stool, denies ab pain. She continues to cough, reports cough is productive, denies chest pain, no hypoxia   She spiked a fever of 102.5 yesterday evening around 9pm   Assessment & Plan:   Principal Problem:   AKI (acute kidney injury) ([place])   Diarrhea, n/v -ct ab/pel no acute abdominal findings -cdiff, gi pcr panel negative  --aggressive hydration  -prn imodium, advance diet as tolerated    Post viral pneumonia?/bronchtis -CT abdomen showed tree-in-bud nodularity in bilateral lung bases -  procalcitonin unremarkable - sputum culture ordered, pending collection -will check urine strep pneumo antigen and urine Legionella antigen -continue Mucinex , antitussives -Reports she was given doxycycline on 12/16 , she could not tolerate it , it made her sick -Obtain blood culture since she spike fever last night,  -c diff testing negative, will start levaquin to cover atypical such as legionella      AKI - likely due to dehydration, ua positive ketone, no sign of infection - ct did not show obstructive nephropathy -Improving, continue hydration   Elevated lft  AST> ALT From acute illness, dehydration?   Ct  ab/pel imaging no acute abdominal finding Denies alcohol use Negative hepatitis panel, ck  wnl improving   Elevated lipase, mild CT abdomen pelvis imaging no acute abdominal finding Denies alcohol use Triglyceride level 140  Denies abdomen pain   Hyponatremia Likely from dehydration, getting aggressive IV fluids, improving    Metabolic acidosis from GI loss with AKI  received LR and started on sodium bicarb supplement improving       HIV On Biktarvy Last CD4 counts was 423 in August 2022      Body mass index is 29.18 kg/m.Marland Kitchen       Unresulted Labs (From admission, onward)     Start     Ordered   08/30/21 0500  Creatinine, serum  (enoxaparin (LOVENOX)    CrCl < 30 ml/min)  Weekly,   R     Comments: while on enoxaparin therapy.    08/23/21 2045   08/25/21 0500  CBC with Differential/Platelet  Tomorrow morning,   R        08/24/21 0802   08/25/21 0500  Comprehensive metabolic panel  Tomorrow morning,   R        08/24/21 0802   08/25/21 0500  Magnesium  Tomorrow morning,   STAT        08/24/21 0802   08/25/21 0500  Lipase, blood  Tomorrow morning,   R        08/24/21 0802   08/25/21 0500  T-helper cells (CD4) count (not at [facility])  Tomorrow morning,   R  08/24/21 0802   08/24/21 0500  Procalcitonin  Daily,   R      08/23/21 1841   08/24/21 0500  Hepatitis panel, acute  Tomorrow morning,   R        08/23/21 2302   08/24/21 0122  Blood gas, arterial  ONCE - STAT,   STAT        08/24/21 0121   08/23/21 1917  Expectorated Sputum Assessment w Gram Stain, Rflx to Resp Cult  Once,   R        08/23/21 1917   08/23/21 1654  Gastrointestinal Panel by PCR , Stool  (Gastrointestinal Panel by PCR, Stool                                                                                                                                                     **Does Not include CLOSTRIDIUM DIFFICILE testing. **If CDIFF testing is needed, place order from the "C Difficile Testing" order  set.**)  Once,   R        08/23/21 1653              DVT prophylaxis: enoxaparin (LOVENOX) injection 30 mg Start: 08/23/21 2100   Code Status: Full Family Communication: Patient Disposition:   Status is: Inpatient  Dispo: The patient is from: Home              Anticipated d/c is to: Home              Anticipated d/c date is: 24 to 48 hours                Consultants:  Phone conversation with ID Dr Baxter Flattery   Procedures:  none  Antimicrobials:        Objective: Vitals:   08/24/21 0230 08/24/21 0300 08/24/21 0335 08/24/21 0755  BP: 102/65 114/71 102/63 112/70  Pulse:  (!) 110 (!) 110 (!) 113  Resp: (!) 39 (!) 28 (!) 24 (!) 33  Temp:   98.7 F (37.1 C)   TempSrc:   Oral   SpO2:  100% 99% 99%  Weight:      Height:        Intake/Output Summary (Last 24 hours) at 08/24/2021 0804 Last data filed at 08/23/2021 2242 Gross per 24 hour  Intake 1400 ml  Output 500 ml  Net 900 ml   Filed Weights   08/23/21 2242  Weight: 77.1 kg    Examination:  General exam: alert, awake, communicative,calm, NAD Respiratory system: some crackles, some rhonchi, less tachypnea Cardiovascular system: Less tachycardia Gastrointestinal system: Abdomen is nondistended, soft and nontender.  Normal bowel sounds heard. Central nervous system: Alert and oriented. No focal neurological deficits. Extremities:  no edema Skin: No rashes, lesions or ulcers Psychiatry: Judgement and insight appear normal. Mood & affect appropriate.  Data Reviewed: I have personally reviewed following labs and imaging studies  CBC: Recent Labs  Lab 08/23/21 1120 08/23/21 2235 08/24/21 0308 08/24/21 0511  WBC 5.3 4.2  --  4.4  HGB 12.2 9.4* 9.5* 10.1*  HCT 42.0 30.4* 28.0* 33.4*  MCV 76.2* 74.5*  --  75.6*  PLT 179 94*  --  111*    Basic Metabolic Panel: Recent Labs  Lab 08/23/21 1120 08/23/21 2235 08/24/21 0308 08/24/21 0511  NA 129*  --  134* 131*  K 3.7  --  3.4* 3.6  CL 98   --   --  104  CO2 14*  --   --  17*  GLUCOSE 118*  --   --  103*  BUN 40*  --   --  32*  CREATININE 1.87* 1.29*  --  1.01*  CALCIUM 8.8*  --   --  8.4*    GFR: Estimated Creatinine Clearance: 72.2 mL/min (A) (by C-G formula based on SCr of 1.01 mg/dL (H)).  Liver Function Tests: Recent Labs  Lab 08/23/21 1120 08/24/21 0511  AST 100* 85*  ALT 28 24  ALKPHOS 106 96  BILITOT 0.8 0.8  PROT 9.8* 7.3  ALBUMIN 3.0* 2.3*    CBG: No results for input(s): GLUCAP in the last 168 hours.   Recent Results (from the past 240 hour(s))  Resp Panel by RT-PCR (Flu A&B, Covid) Nasopharyngeal Swab     Status: Abnormal   Collection Time: 08/16/21 11:23 PM   Specimen: Nasopharyngeal Swab; Nasopharyngeal(NP) swabs in vial transport medium  Result Value Ref Range Status   SARS Coronavirus 2 by RT PCR NEGATIVE NEGATIVE Final    Comment: (NOTE) SARS-CoV-2 target nucleic acids are NOT DETECTED.  The SARS-CoV-2 RNA is generally detectable in upper respiratory specimens during the acute phase of infection. The lowest concentration of SARS-CoV-2 viral copies this assay can detect is 138 copies/mL. A negative result does not preclude SARS-Cov-2 infection and should not be used as the sole basis for treatment or other patient management decisions. A negative result may occur with  improper specimen collection/handling, submission of specimen other than nasopharyngeal swab, presence of viral mutation(s) within the areas targeted by this assay, and inadequate number of viral copies(<138 copies/mL). A negative result must be combined with clinical observations, patient history, and epidemiological information. The expected result is Negative.  Fact Sheet for Patients:  EntrepreneurPulse.com.au  Fact Sheet for Healthcare Providers:  IncredibleEmployment.be  This test is no t yet approved or cleared by the [place] FDA and  has been authorized for detection  and/or diagnosis of SARS-CoV-2 by FDA under an Emergency Use Authorization (EUA). This EUA will remain  in effect (meaning this test can be used) for the duration of the COVID-19 declaration under Section 564(b)(1) of the Act, 21 U.S.C.section 360bbb-3(b)(1), unless the authorization is terminated  or revoked sooner.       Influenza A by PCR POSITIVE (A) NEGATIVE Final   Influenza B by PCR NEGATIVE NEGATIVE Final    Comment: (NOTE) The Xpert Xpress SARS-CoV-2/FLU/RSV plus assay is intended as an aid in the diagnosis of influenza from Nasopharyngeal swab specimens and should not be used as a sole basis for treatment. Nasal washings and aspirates are unacceptable for Xpert Xpress SARS-CoV-2/FLU/RSV testing.  Fact Sheet for Patients: EntrepreneurPulse.com.au  Fact Sheet for Healthcare Providers: IncredibleEmployment.be  This test is not yet approved or cleared by the [place] FDA and has been authorized for detection and/or diagnosis of SARS-CoV-2  by FDA under an Emergency Use Authorization (EUA). This EUA will remain in effect (meaning this test can be used) for the duration of the COVID-19 declaration under Section 564(b)(1) of the Act, 21 U.S.C. section 360bbb-3(b)(1), unless the authorization is terminated or revoked.  Performed at [facility], [address] [place], [place] [address]   Resp Panel by RT-PCR (Flu A&B, Covid) Nasopharyngeal Swab     Status: None   Collection Time: 08/23/21  4:02 PM   Specimen: Nasopharyngeal Swab; Nasopharyngeal(NP) swabs in vial transport medium  Result Value Ref Range Status   SARS Coronavirus 2 by RT PCR NEGATIVE NEGATIVE Final    Comment: (NOTE) SARS-CoV-2 target nucleic acids are NOT DETECTED.  The SARS-CoV-2 RNA is generally detectable in upper respiratory specimens during the acute phase of infection. The lowest concentration of SARS-CoV-2 viral copies this assay can detect  is 138 copies/mL. A negative result does not preclude SARS-Cov-2 infection and should not be used as the sole basis for treatment or other patient management decisions. A negative result may occur with  improper specimen collection/handling, submission of specimen other than nasopharyngeal swab, presence of viral mutation(s) within the areas targeted by this assay, and inadequate number of viral copies(<138 copies/mL). A negative result must be combined with clinical observations, patient history, and epidemiological information. The expected result is Negative.  Fact Sheet for Patients:  EntrepreneurPulse.com.au  Fact Sheet for Healthcare Providers:  IncredibleEmployment.be  This test is no t yet approved or cleared by the [place] FDA and  has been authorized for detection and/or diagnosis of SARS-CoV-2 by FDA under an Emergency Use Authorization (EUA). This EUA will remain  in effect (meaning this test can be used) for the duration of the COVID-19 declaration under Section 564(b)(1) of the Act, 21 U.S.C.section 360bbb-3(b)(1), unless the authorization is terminated  or revoked sooner.       Influenza A by PCR NEGATIVE NEGATIVE Final   Influenza B by PCR NEGATIVE NEGATIVE Final    Comment: (NOTE) The Xpert Xpress SARS-CoV-2/FLU/RSV plus assay is intended as an aid in the diagnosis of influenza from Nasopharyngeal swab specimens and should not be used as a sole basis for treatment. Nasal washings and aspirates are unacceptable for Xpert Xpress SARS-CoV-2/FLU/RSV testing.  Fact Sheet for Patients: EntrepreneurPulse.com.au  Fact Sheet for Healthcare Providers: IncredibleEmployment.be  This test is not yet approved or cleared by the [place] FDA and has been authorized for detection and/or diagnosis of SARS-CoV-2 by FDA under an Emergency Use Authorization (EUA). This EUA will remain in effect  (meaning this test can be used) for the duration of the COVID-19 declaration under Section 564(b)(1) of the Act, 21 U.S.C. section 360bbb-3(b)(1), unless the authorization is terminated or revoked.  Performed at [facility], [place] [address] [place], [place] [address]   C Difficile Quick Screen w PCR reflex     Status: None   Collection Time: 08/23/21  4:38 PM   Specimen: STOOL  Result Value Ref Range Status   C Diff antigen NEGATIVE NEGATIVE Final   C Diff toxin NEGATIVE NEGATIVE Final   C Diff interpretation No C. difficile detected.  Final    Comment: Performed at [facility], [place] [address] [place], [place] [address]         Radiology Studies: DG Chest 2 View  Result Date: 08/23/2021 CLINICAL DATA:  Shortness of breath EXAM: CHEST - 2 VIEW COMPARISON:  Chest radiograph 08/16/2021 FINDINGS: The cardiomediastinal  silhouette is normal. There is no focal consolidation or pulmonary edema. There is no pleural effusion or pneumothorax There is no acute osseous abnormality. IMPRESSION: No radiographic evidence of acute cardiopulmonary process. Electronically Signed   By: Valetta Mole M.D.   On: 08/23/2021 11:25   DG CHEST PORT 1 VIEW  Result Date: 08/24/2021 CLINICAL DATA:  Tachypnea EXAM: PORTABLE CHEST 1 VIEW COMPARISON:  Film from the previous day FINDINGS: Cardiac shadow is accentuated by the frontal technique. Lungs are well aerated bilaterally. No focal infiltrate is seen. Mild vascular congestion is noted. No bony abnormality is seen. IMPRESSION: Mild vascular prominence without edema. No other focal abnormality is seen. Electronically Signed   By: Inez Catalina M.D.   On: 08/24/2021 01:43   CT Renal Stone Study  Result Date: 08/23/2021 CLINICAL DATA:  Nausea, vomiting and diarrhea x 5 days EXAM: CT ABDOMEN AND PELVIS WITHOUT CONTRAST TECHNIQUE: Multidetector CT imaging of the abdomen and pelvis was performed following the standard protocol without IV contrast.  COMPARISON:  None. FINDINGS: Lower chest: Bud nodularity involving bilateral lower lobes, right middle lobe and lingula. Hepatobiliary: Unremarkable noncontrast appearance of the hepatic parenchyma. Gallbladder is decompressed. No biliary ductal dilation. Pancreas: No pancreatic ductal dilation or evidence of acute inflammation. Spleen: Within normal limits. Adrenals/Urinary Tract: Bilateral adrenal glands are unremarkable. No hydronephrosis. No renal, ureteral bladder calculi identified. Urinary bladder is unremarkable for degree of distension. Stomach/Bowel: No enteric contrast was administered. Small hiatal hernia otherwise the stomach is unremarkable degree of distension. No pathologic dilation of large or small bowel. The appendix and terminal ileum appear normal. No evidence acute bowel inflammation. Vascular/Lymphatic: No abdominal aortic aneurysm. No pathologically enlarged abdominopelvic lymph nodes. Reproductive: Nodular uterine contour may reflect leiomyomas. No suspicious adnexal mass. Surgical clips in the pelvis. Other: No significant abdominopelvic free fluid. Musculoskeletal: No acute or significant osseous findings. IMPRESSION: 1. Tree in bud nodularity in the bilateral lung bases, suspicious for an infectious or inflammatory process. 2. No acute findings in the abdomen or pelvis. Specifically no evidence of obstructive uropathy. Electronically Signed   By: Dahlia Bailiff M.D.   On: 08/23/2021 18:17        Scheduled Meds:  enoxaparin (LOVENOX) injection  30 mg Subcutaneous Q24H   guaiFENesin  600 mg Oral BID   sodium bicarbonate  650 mg Oral BID   Continuous Infusions:  lactated ringers 100 mL/hr at 08/23/21 2129     LOS: 1 day   Time spent: 35 mins Greater than 50% of this time was spent in counseling, explanation of diagnosis, planning of further management, and coordination of care.   Voice Recognition Viviann Spare dictation system was used to create this note, attempts have been  made to correct errors. Please contact the author with questions and/or clarifications.   Florencia Reasons, MD PhD FACP [facility]  Available via Epic secure chat 7am-7pm for nonurgent issues Please page for urgent issues To page the attending provider between 7A-7P or the covering provider during after hours 7P-7A, please log into the web site [email or web address] and access using universal [place] password for that web site. If you do not have the password, please call the hospital operator.    08/24/2021, 8:04 AM

## 2021-08-24 NOTE — Progress Notes (Signed)
Pharmacy Antibiotic Note  Briana [name] is a 45 y.o. female admitted on 08/23/2021 with pneumonia.  Pharmacy has been consulted for Levofloxacin dosing.  WBC wnl, AF   Plan: -Levaquin 750 mg IV Q 24 hours -Monitor CBC, renal fx, and cultures   Height: 5\' 4"  (162.6 cm) Weight: 71.4 kg (157 lb 6.5 oz) IBW/kg (Calculated) : 54.7  Temp (24hrs), Avg:99.5 F (37.5 C), Min:98 F (36.7 C), Max:102.5 F (39.2 C)  Recent Labs  Lab 08/23/21 1120 08/23/21 2235 08/24/21 0146 08/24/21 0511  WBC 5.3 4.2  --  4.4  CREATININE 1.87* 1.29*  --  1.01*  LATICACIDVEN  --   --  1.6 1.8    Estimated Creatinine Clearance: 69.6 mL/min (A) (by C-G formula based on SCr of 1.01 mg/dL (H)).    Allergies  Allergen Reactions   Pumpkin Flavor     Lip swelling after eating pumpkin pie    Antimicrobials this admission: Levaquin 12/24 >>   Dose adjustments this admission:  Microbiology results: 12/24 BCx:   Thank you for allowing pharmacy to be a part of this patients care.  Albertina Parr, PharmD., BCPS, BCCCP Clinical Pharmacist Please refer to [facility] for unit-specific pharmacist

## 2021-08-25 DIAGNOSIS — N179 Acute kidney failure, unspecified: Secondary | ICD-10-CM | POA: Diagnosis not present

## 2021-08-25 LAB — PROCALCITONIN: Procalcitonin: 0.28 ng/mL

## 2021-08-25 LAB — COMPREHENSIVE METABOLIC PANEL
ALT: 18 U/L (ref 0–44)
AST: 57 U/L — ABNORMAL HIGH (ref 15–41)
Albumin: 2.2 g/dL — ABNORMAL LOW (ref 3.5–5.0)
Alkaline Phosphatase: 102 U/L (ref 38–126)
Anion gap: 9 (ref 5–15)
BUN: 14 mg/dL (ref 6–20)
CO2: 19 mmol/L — ABNORMAL LOW (ref 22–32)
Calcium: 8.4 mg/dL — ABNORMAL LOW (ref 8.9–10.3)
Chloride: 105 mmol/L (ref 98–111)
Creatinine, Ser: 0.7 mg/dL (ref 0.44–1.00)
GFR, Estimated: >60 mL/min (ref >60)
Glucose, Bld: 87 mg/dL (ref 70–99)
Potassium: 3.7 mmol/L (ref 3.5–5.1)
Sodium: 133 mmol/L — ABNORMAL LOW (ref 135–145)
Total Bilirubin: 0.5 mg/dL (ref 0.3–1.2)
Total Protein: 7 g/dL (ref 6.5–8.1)

## 2021-08-25 LAB — CBC WITH DIFFERENTIAL/PLATELET
Abs Immature Granulocytes: 0 10*3/uL (ref 0.00–0.07)
Band Neutrophils: 2 %
Basophils Absolute: 0 10*3/uL (ref 0.0–0.1)
Basophils Relative: 0 %
Eosinophils Absolute: 0 10*3/uL (ref 0.0–0.5)
Eosinophils Relative: 0 %
HCT: 29.9 % — ABNORMAL LOW (ref 36.0–46.0)
Hemoglobin: 9 g/dL — ABNORMAL LOW (ref 12.0–15.0)
Lymphocytes Relative: 38 %
Lymphs Abs: 1.6 10*3/uL (ref 0.7–4.0)
MCH: 22.4 pg — ABNORMAL LOW (ref 26.0–34.0)
MCHC: 30.1 g/dL (ref 30.0–36.0)
MCV: 74.6 fL — ABNORMAL LOW (ref 80.0–100.0)
Monocytes Absolute: 0 10*3/uL — ABNORMAL LOW (ref 0.1–1.0)
Monocytes Relative: 1 %
Neutro Abs: 2.5 10*3/uL (ref 1.7–7.7)
Neutrophils Relative %: 59 %
Platelets: 106 10*3/uL — ABNORMAL LOW (ref 150–400)
RBC: 4.01 MIL/uL (ref 3.87–5.11)
RDW: 14.7 % (ref 11.5–15.5)
WBC: 4.1 10*3/uL (ref 4.0–10.5)
nRBC: 0 % (ref 0.0–0.2)

## 2021-08-25 LAB — TSH: TSH: <0.01 u[IU]/mL — ABNORMAL LOW (ref 0.350–4.500)

## 2021-08-25 LAB — LIPASE, BLOOD: Lipase: 143 U/L — ABNORMAL HIGH (ref 11–51)

## 2021-08-25 LAB — MAGNESIUM: Magnesium: 2 mg/dL (ref 1.7–2.4)

## 2021-08-25 LAB — STREP PNEUMONIAE URINARY ANTIGEN: Strep Pneumo Urinary Antigen: NEGATIVE

## 2021-08-25 LAB — T4, FREE: Free T4: 4.41 ng/dL — ABNORMAL HIGH (ref 0.61–1.12)

## 2021-08-25 MED ORDER — BENZONATATE 100 MG PO CAPS
100.0000 mg | ORAL_CAPSULE | Freq: Three times a day (TID) | ORAL | Status: DC | PRN
  Administered 2021-08-25: 21:00:00 100 mg via ORAL
  Filled 2021-08-25: qty 1

## 2021-08-25 NOTE — Progress Notes (Addendum)
PROGRESS NOTE    Briana [name]  YIR:[id number] DOB: Jul 22, 1976 DOA: 08/23/2021 PCP: Patient, No Pcp Per (Inactive)    Chief Complaint  Patient presents with   Emesis   Nausea   Diarrhea    Brief Narrative:  Briana [name] is a 45 y.o. female with medical history significant of h/o HIV on HARRT therapy presented to the ED due to N/V/D. Report was seen at urgent care on 08/16/2021, was treated for flu and pneumonia, received antibiotics.  report has been sick since 12/16. Flu and COVID screening negative  Subjective:  Overall continue to improve, She is less tachycardia, less tachypneic ,  Reports n/v has resolved, she tolerated regular diet Diarrhea is slowing down, no ab pain Labs has improved   However, She continues to  have fever, continue to have coughing spells, reports cough is productive, denies chest pain, no hypoxia      Assessment & Plan:   Principal Problem:   AKI (acute kidney injury) ([place])   Diarrhea, n/v -ct ab/pel no acute abdominal findings -cdiff, gi pcr panel negative  --aggressive hydration  -prn imodium, advance diet as tolerated  -improving   Post viral pneumonia?/bronchtis -CT abdomen showed tree-in-bud nodularity in bilateral lung bases -  procalcitonin unremarkable - sputum culture ordered, pending collection -check urine strep pneumo antigen and urine Legionella antigen -continue Mucinex , antitussives -Reports she was given doxycycline on 12/16 , she could not tolerate it , it made her sick -blood culture obtained prior to starting abx, in process -c diff testing negative, started on iv levaquin to cover atypicals such as legionella      AKI - likely due to dehydration, ua positive ketone, no sign of infection - ct did not show obstructive nephropathy -cr normalized, continue hydration for another 24hrs, encourage oral intake   Elevated lft  AST> ALT From acute illness, dehydration?   Ct ab/pel imaging no acute abdominal  finding Denies alcohol use Negative hepatitis panel, ck  wnl improving   Elevated lipase, unclear etiology  CT abdomen pelvis imaging no acute abdominal finding Denies alcohol use Triglyceride level 140  Denies abdomen pain Trend    Hyponatremia Likely from dehydration, getting aggressive IV fluids, improving    Metabolic acidosis from GI loss with AKI  received LR and started on sodium bicarb supplement improving   Microcytic anemia Hgb on admission was falsely normal due to significant hemoconcentration Hgb down to 9 after hydration which is her baseline Denies external bleed Check iron level, b12, folate   Tachycardia, from severe dehydration and fever Check tsh    HIV On Biktarvy Last CD4 counts was 423 in August 2022      Body mass index is 27.02 kg/m.Marland Kitchen       Unresulted Labs (From admission, onward)     Start     Ordered   08/30/21 0500  Creatinine, serum  (enoxaparin (LOVENOX)    CrCl < 30 ml/min)  Weekly,   R     Comments: while on enoxaparin therapy.    08/23/21 2045   08/26/21 0500  Iron and TIBC  Tomorrow morning,   R       Question:  Specimen collection method  Answer:  Lab=Lab collect   08/25/21 1035   08/26/21 0500  Ferritin  Tomorrow morning,   R       Question:  Specimen collection method  Answer:  Lab=Lab collect   08/25/21 1035   08/26/21 0500  CBC with Differential/Platelet  Tomorrow morning,   R       Question:  Specimen collection method  Answer:  Lab=Lab collect   08/25/21 1037   08/26/21 0500  Comprehensive metabolic panel  Tomorrow morning,   R       Question:  Specimen collection method  Answer:  Lab=Lab collect   08/25/21 1037   08/26/21 0500  Lipase, blood  Tomorrow morning,   R       Question:  Specimen collection method  Answer:  Lab=Lab collect   08/25/21 1037   08/25/21 0500  T-helper cells (CD4) count (not at [facility])  Tomorrow morning,   R        08/24/21 0802   08/24/21 1747  Legionella Pneumophila Serogp 1 Ur Ag  Once,   R         08/24/21 1746   08/24/21 1746  Strep pneumoniae urinary antigen  Once,   R        08/24/21 1746   08/24/21 1742  Culture, blood (routine x 2)  BLOOD CULTURE X 2,   R (with TIMED occurrences)      08/24/21 1741   08/24/21 1556  Expectorated Sputum Assessment w Gram Stain, Rflx to Resp Cult  Once,   R        08/24/21 1555   08/24/21 0122  Blood gas, arterial  ONCE - STAT,   STAT        08/24/21 0121   Unscheduled  Occult blood card to lab, stool  As needed,   R      08/25/21 1034              DVT prophylaxis: enoxaparin (LOVENOX) injection 40 mg Start: 08/24/21 2100   Code Status: Full Family Communication: Patient Disposition:   Status is: Inpatient  Dispo: The patient is from: Home              Anticipated d/c is to: Home              Anticipated d/c date is: 48 hours                Consultants:  Phone conversation with ID Dr Baxter Flattery on 12/24  Procedures:  none  Antimicrobials:   Anti-infectives (From admission, onward)    Start     Dose/Rate Route Frequency Ordered Stop   08/25/21 1000  bictegravir-emtricitabine-tenofovir AF (BIKTARVY) 50-200-25 MG per tablet 1 tablet        1 tablet Oral Daily 08/24/21 1600     08/24/21 2100  levofloxacin (LEVAQUIN) IVPB 750 mg        750 mg 100 mL/hr over 90 Minutes Intravenous Every 24 hours 08/24/21 2006             Objective: Vitals:   08/24/21 2039 08/24/21 2336 08/25/21 0433 08/25/21 0741  BP: 122/73 133/71 117/81 118/75  Pulse: (!) 108 (!) 114 95 99  Resp: 18 20 20  (!) 24  Temp: 98.4 F (36.9 C) (!) 100.5 F (38.1 C) 99 F (37.2 C) 98.7 F (37.1 C)  TempSrc: Oral Oral Oral Oral  SpO2: 98% 98% 98% 99%  Weight:      Height:        Intake/Output Summary (Last 24 hours) at 08/25/2021 1038 Last data filed at 08/25/2021 0345 Gross per 24 hour  Intake 2655.79 ml  Output --  Net 2655.79 ml   Filed Weights   08/23/21 2242 08/24/21 1500  Weight: 77.1 kg 71.4 kg  Examination:  General exam:  alert, awake, communicative,calm, NAD Respiratory system: crackles and  rhonchi appear has resolved  today,  tachypnea has resolved, she is on room air Cardiovascular system: Less tachycardia Gastrointestinal system: Abdomen is nondistended, soft and nontender.  Normal bowel sounds heard. Central nervous system: Alert and oriented. No focal neurological deficits. Extremities:  no edema Skin: No rashes, lesions or ulcers Psychiatry: Judgement and insight appear normal. Mood & affect appropriate.     Data Reviewed: I have personally reviewed following labs and imaging studies  CBC: Recent Labs  Lab 08/23/21 1120 08/23/21 2235 08/24/21 0308 08/24/21 0511 08/25/21 0543  WBC 5.3 4.2  --  4.4 4.1  NEUTROABS  --   --   --   --  2.5  HGB 12.2 9.4* 9.5* 10.1* 9.0*  HCT 42.0 30.4* 28.0* 33.4* 29.9*  MCV 76.2* 74.5*  --  75.6* 74.6*  PLT 179 94*  --  111* 106*    Basic Metabolic Panel: Recent Labs  Lab 08/23/21 1120 08/23/21 2235 08/24/21 0308 08/24/21 0511 08/25/21 0543  NA 129*  --  134* 131* 133*  K 3.7  --  3.4* 3.6 3.7  CL 98  --   --  104 105  CO2 14*  --   --  17* 19*  GLUCOSE 118*  --   --  103* 87  BUN 40*  --   --  32* 14  CREATININE 1.87* 1.29*  --  1.01* 0.70  CALCIUM 8.8*  --   --  8.4* 8.4*  MG  --   --   --   --  2.0    GFR: Estimated Creatinine Clearance: 87.9 mL/min (by C-G formula based on SCr of 0.7 mg/dL).  Liver Function Tests: Recent Labs  Lab 08/23/21 1120 08/24/21 0511 08/25/21 0543  AST 100* 85* 57*  ALT 28 24 18   ALKPHOS 106 96 102  BILITOT 0.8 0.8 0.5  PROT 9.8* 7.3 7.0  ALBUMIN 3.0* 2.3* 2.2*    CBG: No results for input(s): GLUCAP in the last 168 hours.   Recent Results (from the past 240 hour(s))  Resp Panel by RT-PCR (Flu A&B, Covid) Nasopharyngeal Swab     Status: Abnormal   Collection Time: 08/16/21 11:23 PM   Specimen: Nasopharyngeal Swab; Nasopharyngeal(NP) swabs in vial transport medium  Result Value Ref Range Status    SARS Coronavirus 2 by RT PCR NEGATIVE NEGATIVE Final    Comment: (NOTE) SARS-CoV-2 target nucleic acids are NOT DETECTED.  The SARS-CoV-2 RNA is generally detectable in upper respiratory specimens during the acute phase of infection. The lowest concentration of SARS-CoV-2 viral copies this assay can detect is 138 copies/mL. A negative result does not preclude SARS-Cov-2 infection and should not be used as the sole basis for treatment or other patient management decisions. A negative result may occur with  improper specimen collection/handling, submission of specimen other than nasopharyngeal swab, presence of viral mutation(s) within the areas targeted by this assay, and inadequate number of viral copies(<138 copies/mL). A negative result must be combined with clinical observations, patient history, and epidemiological information. The expected result is Negative.  Fact Sheet for Patients:  EntrepreneurPulse.com.au  Fact Sheet for Healthcare Providers:  IncredibleEmployment.be  This test is no t yet approved or cleared by the [place] FDA and  has been authorized for detection and/or diagnosis of SARS-CoV-2 by FDA under an Emergency Use Authorization (EUA). This EUA will remain  in effect (meaning this test can be used)  for the duration of the COVID-19 declaration under Section 564(b)(1) of the Act, 21 U.S.C.section 360bbb-3(b)(1), unless the authorization is terminated  or revoked sooner.       Influenza A by PCR POSITIVE (A) NEGATIVE Final   Influenza B by PCR NEGATIVE NEGATIVE Final    Comment: (NOTE) The Xpert Xpress SARS-CoV-2/FLU/RSV plus assay is intended as an aid in the diagnosis of influenza from Nasopharyngeal swab specimens and should not be used as a sole basis for treatment. Nasal washings and aspirates are unacceptable for Xpert Xpress SARS-CoV-2/FLU/RSV testing.  Fact Sheet for  Patients: EntrepreneurPulse.com.au  Fact Sheet for Healthcare Providers: IncredibleEmployment.be  This test is not yet approved or cleared by the [place] FDA and has been authorized for detection and/or diagnosis of SARS-CoV-2 by FDA under an Emergency Use Authorization (EUA). This EUA will remain in effect (meaning this test can be used) for the duration of the COVID-19 declaration under Section 564(b)(1) of the Act, 21 U.S.C. section 360bbb-3(b)(1), unless the authorization is terminated or revoked.  Performed at [facility], [address] [place], [place] [address]   Resp Panel by RT-PCR (Flu A&B, Covid) Nasopharyngeal Swab     Status: None   Collection Time: 08/23/21  4:02 PM   Specimen: Nasopharyngeal Swab; Nasopharyngeal(NP) swabs in vial transport medium  Result Value Ref Range Status   SARS Coronavirus 2 by RT PCR NEGATIVE NEGATIVE Final    Comment: (NOTE) SARS-CoV-2 target nucleic acids are NOT DETECTED.  The SARS-CoV-2 RNA is generally detectable in upper respiratory specimens during the acute phase of infection. The lowest concentration of SARS-CoV-2 viral copies this assay can detect is 138 copies/mL. A negative result does not preclude SARS-Cov-2 infection and should not be used as the sole basis for treatment or other patient management decisions. A negative result may occur with  improper specimen collection/handling, submission of specimen other than nasopharyngeal swab, presence of viral mutation(s) within the areas targeted by this assay, and inadequate number of viral copies(<138 copies/mL). A negative result must be combined with clinical observations, patient history, and epidemiological information. The expected result is Negative.  Fact Sheet for Patients:  EntrepreneurPulse.com.au  Fact Sheet for Healthcare Providers:  IncredibleEmployment.be  This test is no t  yet approved or cleared by the [place] FDA and  has been authorized for detection and/or diagnosis of SARS-CoV-2 by FDA under an Emergency Use Authorization (EUA). This EUA will remain  in effect (meaning this test can be used) for the duration of the COVID-19 declaration under Section 564(b)(1) of the Act, 21 U.S.C.section 360bbb-3(b)(1), unless the authorization is terminated  or revoked sooner.       Influenza A by PCR NEGATIVE NEGATIVE Final   Influenza B by PCR NEGATIVE NEGATIVE Final    Comment: (NOTE) The Xpert Xpress SARS-CoV-2/FLU/RSV plus assay is intended as an aid in the diagnosis of influenza from Nasopharyngeal swab specimens and should not be used as a sole basis for treatment. Nasal washings and aspirates are unacceptable for Xpert Xpress SARS-CoV-2/FLU/RSV testing.  Fact Sheet for Patients: EntrepreneurPulse.com.au  Fact Sheet for Healthcare Providers: IncredibleEmployment.be  This test is not yet approved or cleared by the [place] FDA and has been authorized for detection and/or diagnosis of SARS-CoV-2 by FDA under an Emergency Use Authorization (EUA). This EUA will remain in effect (meaning this test can be used) for the duration of the COVID-19 declaration under Section 564(b)(1) of the Act, 21 U.S.C. section 360bbb-3(b)(1), unless the authorization  is terminated or revoked.  Performed at [facility], [place] [address] [place], [place] [address]   C Difficile Quick Screen w PCR reflex     Status: None   Collection Time: 08/23/21  4:38 PM   Specimen: STOOL  Result Value Ref Range Status   C Diff antigen NEGATIVE NEGATIVE Final   C Diff toxin NEGATIVE NEGATIVE Final   C Diff interpretation No C. difficile detected.  Final    Comment: Performed at [facility], [place] [address], [place], [place] [address]  Gastrointestinal Panel by PCR , Stool     Status: None   Collection Time: 08/23/21  4:54 PM    Specimen: STOOL  Result Value Ref Range Status   Campylobacter species NOT DETECTED NOT DETECTED Final   Plesimonas shigelloides NOT DETECTED NOT DETECTED Final   Salmonella species NOT DETECTED NOT DETECTED Final   Yersinia enterocolitica NOT DETECTED NOT DETECTED Final   Vibrio species NOT DETECTED NOT DETECTED Final   Vibrio cholerae NOT DETECTED NOT DETECTED Final   Enteroaggregative E coli (EAEC) NOT DETECTED NOT DETECTED Final   Enteropathogenic E coli (EPEC) NOT DETECTED NOT DETECTED Final   Enterotoxigenic E coli (ETEC) NOT DETECTED NOT DETECTED Final   Shiga like toxin producing E coli (STEC) NOT DETECTED NOT DETECTED Final   Shigella/Enteroinvasive E coli (EIEC) NOT DETECTED NOT DETECTED Final   Cryptosporidium NOT DETECTED NOT DETECTED Final   Cyclospora cayetanensis NOT DETECTED NOT DETECTED Final   Entamoeba histolytica NOT DETECTED NOT DETECTED Final   Giardia lamblia NOT DETECTED NOT DETECTED Final   Adenovirus F40/41 NOT DETECTED NOT DETECTED Final   Astrovirus NOT DETECTED NOT DETECTED Final   Norovirus GI/GII NOT DETECTED NOT DETECTED Final   Rotavirus A NOT DETECTED NOT DETECTED Final   Sapovirus (I, II, IV, and V) NOT DETECTED NOT DETECTED Final    Comment: Performed at [facility], [address], [place], [place] [address]         Radiology Studies: DG Chest 2 View  Result Date: 08/23/2021 CLINICAL DATA:  Shortness of breath EXAM: CHEST - 2 VIEW COMPARISON:  Chest radiograph 08/16/2021 FINDINGS: The cardiomediastinal silhouette is normal. There is no focal consolidation or pulmonary edema. There is no pleural effusion or pneumothorax There is no acute osseous abnormality. IMPRESSION: No radiographic evidence of acute cardiopulmonary process. Electronically Signed   By: Valetta Mole M.D.   On: 08/23/2021 11:25   DG CHEST PORT 1 VIEW  Result Date: 08/24/2021 CLINICAL DATA:  Tachypnea EXAM: PORTABLE CHEST 1 VIEW COMPARISON:  Film from the previous  day FINDINGS: Cardiac shadow is accentuated by the frontal technique. Lungs are well aerated bilaterally. No focal infiltrate is seen. Mild vascular congestion is noted. No bony abnormality is seen. IMPRESSION: Mild vascular prominence without edema. No other focal abnormality is seen. Electronically Signed   By: Inez Catalina M.D.   On: 08/24/2021 01:43   CT Renal Stone Study  Result Date: 08/23/2021 CLINICAL DATA:  Nausea, vomiting and diarrhea x 5 days EXAM: CT ABDOMEN AND PELVIS WITHOUT CONTRAST TECHNIQUE: Multidetector CT imaging of the abdomen and pelvis was performed following the standard protocol without IV contrast. COMPARISON:  None. FINDINGS: Lower chest: Bud nodularity involving bilateral lower lobes, right middle lobe and lingula. Hepatobiliary: Unremarkable noncontrast appearance of the hepatic parenchyma. Gallbladder is decompressed. No biliary ductal dilation. Pancreas: No pancreatic ductal dilation or evidence of acute inflammation. Spleen: Within normal limits. Adrenals/Urinary Tract: Bilateral adrenal glands  are unremarkable. No hydronephrosis. No renal, ureteral bladder calculi identified. Urinary bladder is unremarkable for degree of distension. Stomach/Bowel: No enteric contrast was administered. Small hiatal hernia otherwise the stomach is unremarkable degree of distension. No pathologic dilation of large or small bowel. The appendix and terminal ileum appear normal. No evidence acute bowel inflammation. Vascular/Lymphatic: No abdominal aortic aneurysm. No pathologically enlarged abdominopelvic lymph nodes. Reproductive: Nodular uterine contour may reflect leiomyomas. No suspicious adnexal mass. Surgical clips in the pelvis. Other: No significant abdominopelvic free fluid. Musculoskeletal: No acute or significant osseous findings. IMPRESSION: 1. Tree in bud nodularity in the bilateral lung bases, suspicious for an infectious or inflammatory process. 2. No acute findings in the abdomen or  pelvis. Specifically no evidence of obstructive uropathy. Electronically Signed   By: Dahlia Bailiff M.D.   On: 08/23/2021 18:17        Scheduled Meds:  bictegravir-emtricitabine-tenofovir AF  1 tablet Oral Daily   enoxaparin (LOVENOX) injection  40 mg Subcutaneous Q24H   guaiFENesin  600 mg Oral BID   sodium bicarbonate  650 mg Oral BID   Continuous Infusions:  lactated ringers 100 mL/hr at 08/25/21 0759   levofloxacin (LEVAQUIN) IV Stopped (08/25/21 0104)     LOS: 2 days   Time spent: 35 mins Greater than 50% of this time was spent in counseling, explanation of diagnosis, planning of further management, and coordination of care.   Voice Recognition Viviann Spare dictation system was used to create this note, attempts have been made to correct errors. Please contact the author with questions and/or clarifications.   Florencia Reasons, MD PhD FACP [facility]  Available via Epic secure chat 7am-7pm for nonurgent issues Please page for urgent issues To page the attending provider between 7A-7P or the covering provider during after hours 7P-7A, please log into the web site [email or web address] and access using universal [place] password for that web site. If you do not have the password, please call the hospital operator.    08/25/2021, 10:38 AM

## 2021-08-25 NOTE — Care Management (Signed)
°  Transition of Care [facility]) Screening Note   Patient Details  Name: Briana [name] Date of Birth: June 09, 1976   Transition of Care [facility]) CM/SW Contact:    Carles Collet, RN Phone Number: 08/25/2021, 9:18 AM    Transition of Care Department [facility]) has reviewed patient and no TOC needs have been identified at this time. We will continue to monitor patient advancement through interdisciplinary progression rounds. If new patient transition needs arise, please place a TOC consult.

## 2021-08-26 DIAGNOSIS — E0590 Thyrotoxicosis, unspecified without thyrotoxic crisis or storm: Secondary | ICD-10-CM

## 2021-08-26 DIAGNOSIS — N179 Acute kidney failure, unspecified: Secondary | ICD-10-CM | POA: Diagnosis not present

## 2021-08-26 LAB — EXPECTORATED SPUTUM ASSESSMENT W GRAM STAIN, RFLX TO RESP C

## 2021-08-26 LAB — COMPREHENSIVE METABOLIC PANEL
ALT: 19 U/L (ref 0–44)
AST: 46 U/L — ABNORMAL HIGH (ref 15–41)
Albumin: 2.1 g/dL — ABNORMAL LOW (ref 3.5–5.0)
Alkaline Phosphatase: 103 U/L (ref 38–126)
Anion gap: 12 (ref 5–15)
BUN: 9 mg/dL (ref 6–20)
CO2: 17 mmol/L — ABNORMAL LOW (ref 22–32)
Calcium: 8.2 mg/dL — ABNORMAL LOW (ref 8.9–10.3)
Chloride: 109 mmol/L (ref 98–111)
Creatinine, Ser: 0.68 mg/dL (ref 0.44–1.00)
GFR, Estimated: >60 mL/min (ref >60)
Glucose, Bld: 69 mg/dL — ABNORMAL LOW (ref 70–99)
Potassium: 3.8 mmol/L (ref 3.5–5.1)
Sodium: 138 mmol/L (ref 135–145)
Total Bilirubin: 0.6 mg/dL (ref 0.3–1.2)
Total Protein: 7 g/dL (ref 6.5–8.1)

## 2021-08-26 LAB — CBC WITH DIFFERENTIAL/PLATELET
Abs Immature Granulocytes: 0 10*3/uL (ref 0.00–0.07)
Band Neutrophils: 3 %
Basophils Absolute: 0 10*3/uL (ref 0.0–0.1)
Basophils Relative: 0 %
Eosinophils Absolute: 0 10*3/uL (ref 0.0–0.5)
Eosinophils Relative: 1 %
HCT: 29.1 % — ABNORMAL LOW (ref 36.0–46.0)
Hemoglobin: 8.9 g/dL — ABNORMAL LOW (ref 12.0–15.0)
Lymphocytes Relative: 29 %
Lymphs Abs: 1 10*3/uL (ref 0.7–4.0)
MCH: 22.6 pg — ABNORMAL LOW (ref 26.0–34.0)
MCHC: 30.6 g/dL (ref 30.0–36.0)
MCV: 74 fL — ABNORMAL LOW (ref 80.0–100.0)
Monocytes Absolute: 0.4 10*3/uL (ref 0.1–1.0)
Monocytes Relative: 10 %
Neutro Abs: 2.1 10*3/uL (ref 1.7–7.7)
Neutrophils Relative %: 57 %
Platelets: 106 10*3/uL — ABNORMAL LOW (ref 150–400)
RBC: 3.93 MIL/uL (ref 3.87–5.11)
RDW: 14.7 % (ref 11.5–15.5)
Smear Review: NORMAL
WBC: 3.5 10*3/uL — ABNORMAL LOW (ref 4.0–10.5)
nRBC: 0 % (ref 0.0–0.2)

## 2021-08-26 LAB — IRON AND TIBC
Iron: 28 ug/dL (ref 28–170)
Saturation Ratios: 13 % (ref 10.4–31.8)
TIBC: 221 ug/dL — ABNORMAL LOW (ref 250–450)
UIBC: 193 ug/dL

## 2021-08-26 LAB — FERRITIN: Ferritin: 520 ng/mL — ABNORMAL HIGH (ref 11–307)

## 2021-08-26 LAB — LIPASE, BLOOD: Lipase: 82 U/L — ABNORMAL HIGH (ref 11–51)

## 2021-08-26 MED ORDER — CYANOCOBALAMIN 100 MCG PO TABS: 100.0000 ug | ORAL_TABLET | Freq: Every day | ORAL | 0 refills | Status: DC

## 2021-08-26 MED ORDER — LEVOFLOXACIN 750 MG PO TABS: 750.0000 mg | ORAL_TABLET | Freq: Every day | ORAL | 0 refills | Status: AC

## 2021-08-26 MED ORDER — FERROUS SULFATE 325 (65 FE) MG PO TABS
325.0000 mg | ORAL_TABLET | Freq: Every day | ORAL | 5 refills | Status: DC
Start: 2021-08-26 — End: 2023-01-08

## 2021-08-26 MED ORDER — ATENOLOL 50 MG PO TABS: 50.0000 mg | ORAL_TABLET | Freq: Every day | ORAL | 11 refills | Status: DC

## 2021-08-26 NOTE — Discharge Summary (Signed)
PATIENT DETAILS Name: Briana [name] Age: 45 y.o. Sex: female Date of Birth: April 09, 1976 MRN: [id number]. Admitting Physician: Florencia Reasons, MD WUJ:[id number], No Pcp Per (Inactive)  Admit Date: 08/23/2021 Discharge date: 08/26/2021  Recommendations for Outpatient Follow-up:  Follow up with PCP in 1-2 weeks Please obtain CMP/CBC in one week Urine Legionella antigen pending-please follow. Incidental finding-hyperthyroidism-started on beta-blocker-needs outpatient endocrinology follow-up.   Admitted From:  Home  Disposition: [place]: No  Equipment/Devices: None  Discharge Condition: Stable  CODE STATUS: FULL CODE  Diet recommendation:  Diet Order             Diet regular Room service appropriate? Yes; Fluid consistency: Thin  Diet effective now                    Brief Summary: See H&P, Labs, Consult and Test reports for all details in brief, patient is a 45 year old female with history of HIV-on ART-presented to the ED with nausea, vomiting and diarrhea.  She was recently seen at a local urgent care on 12/16-and apparently was treated for flu/PNA.  She was subsequently admitted to the hospitalist service for further evaluation and treatment.  Brief Hospital Course: Nausea, vomiting and diarrhea: Has resolved-stool C. difficile/GI pathogen panel was negative.  No diarrhea since yesterday.  Tolerating regular diet.  No abdominal pain.  AKI: Likely hemodynamically mediated-creatinine has normalized with supportive care  Atypical pneumonia: Lung cuts in CT abdomen showed tree-in-bud nodularity-started on levofloxacin-cough/fever have markedly improved.  She previously was on doxycycline and could not tolerated.  Sputum cultures were negative.  Procalcitonin was not elevated.  Streptococcal pneumoniae urinary antigen was negative-Legionella antigen is pending-please follow-but patient already on levofloxacin.  Transaminitis: Probably due to viral  syndrome-acute hepatitis serology was negative.  CT abdomen did not show any hepatobiliary abnormalities.  Liver enzymes have improved-please repeat CMP at next follow-up with PCP.  Hyponatremia: Mild-improved with just supportive care.  Mildly elevated lipase: Unclear etiology-no clinical setting for pancreatitis.  CT abdomen without any pancreatic abnormality.  Hyperthyroidism: Appears to be incidental finding-she denies any weight loss/tremors/anxiety/diaphoresis etc.  Patient's diarrhea/fever etc on admission. was probably from an infection issue rather than hyperthyroidism as it has completely resolved. Will place on low-dose beta-blockers-I have asked her to ask a referral to a endocrinologist from a primary care practitioner.  Definitive treatment including antithyroid drugs etc. can be done at the visit as patient really does not have any symptoms.  HIV: Last CD4 count 423 in August 2022-continue ART on discharge.  Follow with primary ID physician.  Chronic microcytic anemia: Chronic issue-stable for continued outpatient work-up/monitoring.  Procedures None  Discharge Diagnoses:  Principal Problem:   AKI (acute kidney injury) [facility])   Discharge Instructions:  Activity:  As tolerated    Discharge Instructions     Ambulatory referral to Endocrinology   Complete by: As directed    New onset hyperthyroidism-please assess/evaluate and provide recommendations.   Discharge instructions   Complete by: As directed    Follow with Primary MD  Patient, No Pcp Per (Inactive) in 1-2 weeks  Please get a complete blood count and chemistry panel checked by your Primary MD at your next visit, and again as instructed by your Primary MD.  Get Medicines reviewed and adjusted: Please take all your medications with you for your next visit with your Primary MD  Laboratory/radiological data: Please request your Primary MD to go over all hospital tests and procedure/radiological results at the  follow up, please ask your Primary MD to get all Hospital records sent to his/her office.  In some cases, they will be blood work, cultures and biopsy results pending at the time of your discharge. Please request that your primary care M.D. follows up on these results.  Also Note the following: If you experience worsening of your admission symptoms, develop shortness of breath, life threatening emergency, suicidal or homicidal thoughts you must seek medical attention immediately by calling 911 or calling your MD immediately  if symptoms less severe.  You must read complete instructions/literature along with all the possible adverse reactions/side effects for all the Medicines you take and that have been prescribed to you. Take any new Medicines after you have completely understood and accpet all the possible adverse reactions/side effects.   Do not drive when taking Pain medications or sleeping medications (Benzodaizepines)  Do not take more than prescribed Pain, Sleep and Anxiety Medications. It is not advisable to combine anxiety,sleep and pain medications without talking with your primary care practitioner  Special Instructions: If you have smoked or chewed Tobacco  in the last 2 yrs please stop smoking, stop any regular Alcohol  and or any Recreational drug use.  Wear Seat belts while driving.  Please note: You were cared for by a hospitalist during your hospital stay. Once you are discharged, your primary care physician will handle any further medical issues. Please note that NO REFILLS for any discharge medications will be authorized once you are discharged, as it is imperative that you return to your primary care physician (or establish a relationship with a primary care physician if you do not have one) for your post hospital discharge needs so that they can reassess your need for medications and monitor your lab values.   1.  You were found to have incidental finding of hyperactive  thyroid gland-please ask your primary care practitioner for a referral to endocrinologist.  You have been started on a low-dose beta-blocker for now.      Allergies as of 08/26/2021       Reactions   Pumpkin Flavor    Lip swelling after eating pumpkin pie        Medication List     STOP taking these medications    doxycycline 100 MG capsule Commonly known as: VIBRAMYCIN   famotidine 20 MG tablet Commonly known as: PEPCID   triamcinolone ointment 0.5 % Commonly known as: KENALOG       TAKE these medications    atenolol 50 MG tablet Commonly known as: Tenormin Take 1 tablet (50 mg total) by mouth daily.   Biktarvy 50-200-25 MG Tabs tablet Generic drug: bictegravir-emtricitabine-tenofovir AF TAKE 1 TABLET DAILY (ADDITIONAL REFILLS AT 10/12 APPOINTMENT)   cyanocobalamin 100 MCG tablet Take 1 tablet (100 mcg total) by mouth daily.   ferrous sulfate 325 (65 FE) MG tablet Take 1 tablet (325 mg total) by mouth daily with breakfast.   levofloxacin 750 MG tablet Commonly known as: Levaquin Take 1 tablet (750 mg total) by mouth daily for 3 days.        Follow-up Information     Comer, Okey Regal, MD. Schedule an appointment as soon as possible for a visit in 1 week(s).   Specialty: Infectious Diseases Contact information: [address] [place] [address] [phone]         [facility] Follow up.   Specialty: Gastroenterology Why: Office will call with date/time, If you dont hear from them,please  give them a call Contact information: [place] [address] [phone]               Allergies  Allergen Reactions   Pumpkin Flavor     Lip swelling after eating pumpkin pie      Consultations:  None   Other Procedures/Studies: DG Chest 2 View  Result Date: 08/23/2021 CLINICAL DATA:  Shortness of breath EXAM: CHEST - 2 VIEW COMPARISON:  Chest radiograph 08/16/2021  FINDINGS: The cardiomediastinal silhouette is normal. There is no focal consolidation or pulmonary edema. There is no pleural effusion or pneumothorax There is no acute osseous abnormality. IMPRESSION: No radiographic evidence of acute cardiopulmonary process. Electronically Signed   By: Valetta Mole M.D.   On: 08/23/2021 11:25   DG Chest 2 View  Result Date: 08/16/2021 CLINICAL DATA:  Cough. EXAM: CHEST - 2 VIEW COMPARISON:  Chest x-ray 11/25/2016. FINDINGS: There are minimal patchy airspace opacities in the lower lobes, left greater than right. There is no pleural effusion or pneumothorax. Cardiomediastinal silhouette is within normal limits. No acute fractures. IMPRESSION: 1. Minimal bibasilar airspace, left greater than right, disease worrisome for infection. Electronically Signed   By: Ronney Asters M.D.   On: 08/16/2021 23:41   DG CHEST PORT 1 VIEW  Result Date: 08/24/2021 CLINICAL DATA:  Tachypnea EXAM: PORTABLE CHEST 1 VIEW COMPARISON:  Film from the previous day FINDINGS: Cardiac shadow is accentuated by the frontal technique. Lungs are well aerated bilaterally. No focal infiltrate is seen. Mild vascular congestion is noted. No bony abnormality is seen. IMPRESSION: Mild vascular prominence without edema. No other focal abnormality is seen. Electronically Signed   By: Inez Catalina M.D.   On: 08/24/2021 01:43   CT Renal Stone Study  Result Date: 08/23/2021 CLINICAL DATA:  Nausea, vomiting and diarrhea x 5 days EXAM: CT ABDOMEN AND PELVIS WITHOUT CONTRAST TECHNIQUE: Multidetector CT imaging of the abdomen and pelvis was performed following the standard protocol without IV contrast. COMPARISON:  None. FINDINGS: Lower chest: Bud nodularity involving bilateral lower lobes, right middle lobe and lingula. Hepatobiliary: Unremarkable noncontrast appearance of the hepatic parenchyma. Gallbladder is decompressed. No biliary ductal dilation. Pancreas: No pancreatic ductal dilation or evidence of acute  inflammation. Spleen: Within normal limits. Adrenals/Urinary Tract: Bilateral adrenal glands are unremarkable. No hydronephrosis. No renal, ureteral bladder calculi identified. Urinary bladder is unremarkable for degree of distension. Stomach/Bowel: No enteric contrast was administered. Small hiatal hernia otherwise the stomach is unremarkable degree of distension. No pathologic dilation of large or small bowel. The appendix and terminal ileum appear normal. No evidence acute bowel inflammation. Vascular/Lymphatic: No abdominal aortic aneurysm. No pathologically enlarged abdominopelvic lymph nodes. Reproductive: Nodular uterine contour may reflect leiomyomas. No suspicious adnexal mass. Surgical clips in the pelvis. Other: No significant abdominopelvic free fluid. Musculoskeletal: No acute or significant osseous findings. IMPRESSION: 1. Tree in bud nodularity in the bilateral lung bases, suspicious for an infectious or inflammatory process. 2. No acute findings in the abdomen or pelvis. Specifically no evidence of obstructive uropathy. Electronically Signed   By: Dahlia Bailiff M.D.   On: 08/23/2021 18:17     TODAY-DAY OF DISCHARGE:  Subjective:   Kerrin Mo today has no headache,no chest abdominal pain,no new weakness tingling or numbness, feels much better wants to go home today.   Objective:   Blood pressure 115/69, pulse 98, temperature 97.9 F (36.6 C), temperature source Oral, resp. rate 19, height 5\' 4"  (1.626 m), weight 71.4 kg, last menstrual  period 08/09/2021, SpO2 97 %.  Intake/Output Summary (Last 24 hours) at 08/26/2021 1024 Last data filed at 08/26/2021 1017 Gross per 24 hour  Intake 960 ml  Output 400 ml  Net 560 ml   Filed Weights   08/23/21 2242 08/24/21 1500  Weight: 77.1 kg 71.4 kg    Exam: Awake Alert, Oriented *3, No new F.N deficits, Normal affect [place].AT,PERRAL Supple Neck,No JVD, No cervical lymphadenopathy appriciated.  Symmetrical Chest wall movement, Good  air movement bilaterally, CTAB RRR,No Gallops,Rubs or new Murmurs, No Parasternal Heave +ve B.Sounds, Abd Soft, Non tender, No organomegaly appriciated, No rebound -guarding or rigidity. No Cyanosis, Clubbing or edema, No new Rash or bruise   PERTINENT RADIOLOGIC STUDIES: No results found.   PERTINENT LAB RESULTS: CBC: Recent Labs    08/25/21 0543 08/26/21 0050  WBC 4.1 3.5*  HGB 9.0* 8.9*  HCT 29.9* 29.1*  PLT 106* 106*   CMET CMP     Component Value Date/Time   NA 138 08/26/2021 0050   K 3.8 08/26/2021 0050   CL 109 08/26/2021 0050   CO2 17 (L) 08/26/2021 0050   GLUCOSE 69 (L) 08/26/2021 0050   BUN 9 08/26/2021 0050   CREATININE 0.68 08/26/2021 0050   CREATININE 0.52 04/30/2021 1020   CALCIUM 8.2 (L) 08/26/2021 0050   PROT 7.0 08/26/2021 0050   ALBUMIN 2.1 (L) 08/26/2021 0050   AST 46 (H) 08/26/2021 0050   ALT 19 08/26/2021 0050   ALKPHOS 103 08/26/2021 0050   BILITOT 0.6 08/26/2021 0050   GFRNONAA >60 08/26/2021 0050   GFRNONAA 107 04/19/2019 1005   GFRAA 123 04/19/2019 1005    GFR Estimated Creatinine Clearance: 87.9 mL/min (by C-G formula based on SCr of 0.68 mg/dL). Recent Labs    08/25/21 0543 08/26/21 0050  LIPASE 143* 82*   Recent Labs    08/24/21 0511  CKTOTAL 107   Invalid input(s): POCBNP No results for input(s): DDIMER in the last 72 hours. No results for input(s): HGBA1C in the last 72 hours. Recent Labs    08/24/21 0511  CHOL 51  HDL <10*  LDLCALC NOT CALCULATED  TRIG 140  CHOLHDL NOT CALCULATED   Recent Labs    08/25/21 0543  TSH <0.010*   Recent Labs    08/26/21 0050  FERRITIN 520*  TIBC 221*  IRON 28   Coags: No results for input(s): INR in the last 72 hours.  Invalid input(s): PT Microbiology: Recent Results (from the past 240 hour(s))  Resp Panel by RT-PCR (Flu A&B, Covid) Nasopharyngeal Swab     Status: Abnormal   Collection Time: 08/16/21 11:23 PM   Specimen: Nasopharyngeal Swab; Nasopharyngeal(NP) swabs  in vial transport medium  Result Value Ref Range Status   SARS Coronavirus 2 by RT PCR NEGATIVE NEGATIVE Final    Comment: (NOTE) SARS-CoV-2 target nucleic acids are NOT DETECTED.  The SARS-CoV-2 RNA is generally detectable in upper respiratory specimens during the acute phase of infection. The lowest concentration of SARS-CoV-2 viral copies this assay can detect is 138 copies/mL. A negative result does not preclude SARS-Cov-2 infection and should not be used as the sole basis for treatment or other patient management decisions. A negative result may occur with  improper specimen collection/handling, submission of specimen other than nasopharyngeal swab, presence of viral mutation(s) within the areas targeted by this assay, and inadequate number of viral copies(<138 copies/mL). A negative result must be combined with clinical observations, patient history, and epidemiological information. The expected result is Negative.  Fact Sheet for Patients:  EntrepreneurPulse.com.au  Fact Sheet for Healthcare Providers:  IncredibleEmployment.be  This test is no t yet approved or cleared by the [place] FDA and  has been authorized for detection and/or diagnosis of SARS-CoV-2 by FDA under an Emergency Use Authorization (EUA). This EUA will remain  in effect (meaning this test can be used) for the duration of the COVID-19 declaration under Section 564(b)(1) of the Act, 21 U.S.C.section 360bbb-3(b)(1), unless the authorization is terminated  or revoked sooner.       Influenza A by PCR POSITIVE (A) NEGATIVE Final   Influenza B by PCR NEGATIVE NEGATIVE Final    Comment: (NOTE) The Xpert Xpress SARS-CoV-2/FLU/RSV plus assay is intended as an aid in the diagnosis of influenza from Nasopharyngeal swab specimens and should not be used as a sole basis for treatment. Nasal washings and aspirates are unacceptable for Xpert Xpress  SARS-CoV-2/FLU/RSV testing.  Fact Sheet for Patients: EntrepreneurPulse.com.au  Fact Sheet for Healthcare Providers: IncredibleEmployment.be  This test is not yet approved or cleared by the [place] FDA and has been authorized for detection and/or diagnosis of SARS-CoV-2 by FDA under an Emergency Use Authorization (EUA). This EUA will remain in effect (meaning this test can be used) for the duration of the COVID-19 declaration under Section 564(b)(1) of the Act, 21 U.S.C. section 360bbb-3(b)(1), unless the authorization is terminated or revoked.  Performed at [facility], [address] [place], [place] [address]   Resp Panel by RT-PCR (Flu A&B, Covid) Nasopharyngeal Swab     Status: None   Collection Time: 08/23/21  4:02 PM   Specimen: Nasopharyngeal Swab; Nasopharyngeal(NP) swabs in vial transport medium  Result Value Ref Range Status   SARS Coronavirus 2 by RT PCR NEGATIVE NEGATIVE Final    Comment: (NOTE) SARS-CoV-2 target nucleic acids are NOT DETECTED.  The SARS-CoV-2 RNA is generally detectable in upper respiratory specimens during the acute phase of infection. The lowest concentration of SARS-CoV-2 viral copies this assay can detect is 138 copies/mL. A negative result does not preclude SARS-Cov-2 infection and should not be used as the sole basis for treatment or other patient management decisions. A negative result may occur with  improper specimen collection/handling, submission of specimen other than nasopharyngeal swab, presence of viral mutation(s) within the areas targeted by this assay, and inadequate number of viral copies(<138 copies/mL). A negative result must be combined with clinical observations, patient history, and epidemiological information. The expected result is Negative.  Fact Sheet for Patients:  EntrepreneurPulse.com.au  Fact Sheet for Healthcare Providers:   IncredibleEmployment.be  This test is no t yet approved or cleared by the [place] FDA and  has been authorized for detection and/or diagnosis of SARS-CoV-2 by FDA under an Emergency Use Authorization (EUA). This EUA will remain  in effect (meaning this test can be used) for the duration of the COVID-19 declaration under Section 564(b)(1) of the Act, 21 U.S.C.section 360bbb-3(b)(1), unless the authorization is terminated  or revoked sooner.       Influenza A by PCR NEGATIVE NEGATIVE Final   Influenza B by PCR NEGATIVE NEGATIVE Final    Comment: (NOTE) The Xpert Xpress SARS-CoV-2/FLU/RSV plus assay is intended as an aid in the diagnosis of influenza from Nasopharyngeal swab specimens and should not be used as a sole basis for treatment. Nasal washings and aspirates are unacceptable for Xpert Xpress SARS-CoV-2/FLU/RSV testing.  Fact Sheet for Patients: EntrepreneurPulse.com.au  Fact Sheet for Healthcare Providers: IncredibleEmployment.be  This test  is not yet approved or cleared by the [place] and has been authorized for detection and/or diagnosis of SARS-CoV-2 by FDA under an Emergency Use Authorization (EUA). This EUA will remain in effect (meaning this test can be used) for the duration of the COVID-19 declaration under Section 564(b)(1) of the Act, 21 U.S.C. section 360bbb-3(b)(1), unless the authorization is terminated or revoked.  Performed at [facility], [place] [address] [place], [place] [address]   C Difficile Quick Screen w PCR reflex     Status: None   Collection Time: 08/23/21  4:38 PM   Specimen: STOOL  Result Value Ref Range Status   C Diff antigen NEGATIVE NEGATIVE Final   C Diff toxin NEGATIVE NEGATIVE Final   C Diff interpretation No C. difficile detected.  Final    Comment: Performed at [facility], [place] [address] [place], [place] [address]  Gastrointestinal Panel by PCR ,  Stool     Status: None   Collection Time: 08/23/21  4:54 PM   Specimen: STOOL  Result Value Ref Range Status   Campylobacter species NOT DETECTED NOT DETECTED Final   Plesimonas shigelloides NOT DETECTED NOT DETECTED Final   Salmonella species NOT DETECTED NOT DETECTED Final   Yersinia enterocolitica NOT DETECTED NOT DETECTED Final   Vibrio species NOT DETECTED NOT DETECTED Final   Vibrio cholerae NOT DETECTED NOT DETECTED Final   Enteroaggregative E coli (EAEC) NOT DETECTED NOT DETECTED Final   Enteropathogenic E coli (EPEC) NOT DETECTED NOT DETECTED Final   Enterotoxigenic E coli (ETEC) NOT DETECTED NOT DETECTED Final   Shiga like toxin producing E coli (STEC) NOT DETECTED NOT DETECTED Final   Shigella/Enteroinvasive E coli (EIEC) NOT DETECTED NOT DETECTED Final   Cryptosporidium NOT DETECTED NOT DETECTED Final   Cyclospora cayetanensis NOT DETECTED NOT DETECTED Final   Entamoeba histolytica NOT DETECTED NOT DETECTED Final   Giardia lamblia NOT DETECTED NOT DETECTED Final   Adenovirus F40/41 NOT DETECTED NOT DETECTED Final   Astrovirus NOT DETECTED NOT DETECTED Final   Norovirus GI/GII NOT DETECTED NOT DETECTED Final   Rotavirus A NOT DETECTED NOT DETECTED Final   Sapovirus (I, II, IV, and V) NOT DETECTED NOT DETECTED Final    Comment: Performed at [facility], [address] [place], [place] [address]  Culture, blood (routine x 2)     Status: None (Preliminary result)   Collection Time: 08/24/21  7:40 PM   Specimen: BLOOD  Result Value Ref Range Status   Specimen Description BLOOD LEFT ANTECUBITAL  Final   Special Requests AEROBIC BOTTLE ONLY Blood Culture adequate volume  Final   Culture   Final    NO GROWTH 2 DAYS Performed at [facility], [address], [place], [place] [address]    Report Status PENDING  Incomplete  Culture, blood (routine x 2)     Status: None (Preliminary result)   Collection Time: 08/24/21  7:40 PM   Specimen: BLOOD  Result Value  Ref Range Status   Specimen Description BLOOD BLOOD LEFT WRIST  Final   Special Requests AEROBIC BOTTLE ONLY Blood Culture adequate volume  Final   Culture   Final    NO GROWTH 2 DAYS Performed at [facility], [place] [address] [place], [place] [address]    Report Status PENDING  Incomplete  Expectorated Sputum Assessment w Gram Stain, Rflx to Resp Cult     Status: None   Collection Time: 08/25/21  5:52 PM   Specimen: Expectorated Sputum  Result Value Ref Range Status   Specimen Description EXPECTORATED SPUTUM  Final   Special Requests NONE  Final   Sputum evaluation   Final    THIS SPECIMEN IS ACCEPTABLE FOR SPUTUM CULTURE Performed at [facility], [address], [place], [place] [address]    Report Status 08/26/2021 FINAL  Final  Culture, Respiratory w Gram Stain     Status: None (Preliminary result)   Collection Time: 08/25/21  5:52 PM  Result Value Ref Range Status   Specimen Description EXPECTORATED SPUTUM  Final   Special Requests NONE Reflexed from [id number]  Final   Gram Stain   Final    ABUNDANT WBC PRESENT, PREDOMINANTLY PMN RARE SQUAMOUS EPITHELIAL CELLS PRESENT ABUNDANT GRAM POSITIVE COCCI MODERATE GRAM NEGATIVE RODS FEW GRAM POSITIVE RODS RARE YEAST Performed at [facility], [place] [address], [place], [place] [address]    Culture PENDING  Incomplete   Report Status PENDING  Incomplete    FURTHER DISCHARGE INSTRUCTIONS:  Get Medicines reviewed and adjusted: Please take all your medications with you for your next visit with your Primary MD  Laboratory/radiological data: Please request your Primary MD to go over all hospital tests and procedure/radiological results at the follow up, please ask your Primary MD to get all Hospital records sent to his/her office.  In some cases, they will be blood work, cultures and biopsy results pending at the time of your discharge. Please request that your primary care M.D. goes through all the records of your  hospital data and follows up on these results.  Also Note the following: If you experience worsening of your admission symptoms, develop shortness of breath, life threatening emergency, suicidal or homicidal thoughts you must seek medical attention immediately by calling 911 or calling your MD immediately  if symptoms less severe.  You must read complete instructions/literature along with all the possible adverse reactions/side effects for all the Medicines you take and that have been prescribed to you. Take any new Medicines after you have completely understood and accpet all the possible adverse reactions/side effects.   Do not drive when taking Pain medications or sleeping medications (Benzodaizepines)  Do not take more than prescribed Pain, Sleep and Anxiety Medications. It is not advisable to combine anxiety,sleep and pain medications without talking with your primary care practitioner  Special Instructions: If you have smoked or chewed Tobacco  in the last 2 yrs please stop smoking, stop any regular Alcohol  and or any Recreational drug use.  Wear Seat belts while driving.  Please note: You were cared for by a hospitalist during your hospital stay. Once you are discharged, your primary care physician will handle any further medical issues. Please note that NO REFILLS for any discharge medications will be authorized once you are discharged, as it is imperative that you return to your primary care physician (or establish a relationship with a primary care physician if you do not have one) for your post hospital discharge needs so that they can reassess your need for medications and monitor your lab values.  Total Time spent coordinating discharge including counseling, education and face to face time equals 35 minutes.  SignedOren Binet 08/26/2021 10:24 AM

## 2021-08-27 ENCOUNTER — Telehealth: Payer: Self-pay

## 2021-08-27 LAB — LEGIONELLA PNEUMOPHILA SEROGP 1 UR AG: L. pneumophila Serogp 1 Ur Ag: NEGATIVE

## 2021-08-27 NOTE — Telephone Encounter (Signed)
-----   Message from Thayer Headings, MD sent at 08/27/2021  8:42 AM EST ----- Can you let her know she needs to get in to a PCP?  She was recently hospitalized and needs follow up for thyroid issues.  thanks

## 2021-08-27 NOTE — Telephone Encounter (Signed)
Spoke with patient, notified her that Dr. Linus Salmons would like for her to find a PCP to help manage thyroid. She says that she discussed this with the provider in the hospital as well. Asked that she please call back with any further questions or issues. Patient verbalized understanding and has no further questions.   Beryle Flock, RN

## 2021-08-28 LAB — CULTURE, RESPIRATORY W GRAM STAIN: Culture: NORMAL

## 2021-08-29 LAB — CULTURE, BLOOD (ROUTINE X 2)
Culture: NO GROWTH
Culture: NO GROWTH
Special Requests: ADEQUATE
Special Requests: ADEQUATE

## 2021-09-02 ENCOUNTER — Telehealth: Payer: Self-pay

## 2021-09-02 ENCOUNTER — Other Ambulatory Visit: Payer: Self-pay | Admitting: Internal Medicine

## 2021-09-02 DIAGNOSIS — B20 Human immunodeficiency virus [HIV] disease: Secondary | ICD-10-CM

## 2021-09-02 NOTE — Telephone Encounter (Signed)
Attempted to contact patient today to assist with scheduling overdue lab/office visit with Dr. Linus Salmons. Pending medication refill request for [facility] approved. Patient will need a follow up visit scheduled for future refills.  Briana [name]

## 2021-10-01 ENCOUNTER — Ambulatory Visit: Payer: Managed Care, Other (non HMO) | Admitting: Pharmacist

## 2021-10-02 ENCOUNTER — Ambulatory Visit: Payer: Managed Care, Other (non HMO) | Admitting: Pharmacist

## 2021-10-08 ENCOUNTER — Ambulatory Visit (INDEPENDENT_AMBULATORY_CARE_PROVIDER_SITE_OTHER): Payer: Self-pay | Admitting: Pharmacist

## 2021-10-08 ENCOUNTER — Ambulatory Visit (INDEPENDENT_AMBULATORY_CARE_PROVIDER_SITE_OTHER): Payer: Managed Care, Other (non HMO)

## 2021-10-08 ENCOUNTER — Other Ambulatory Visit: Payer: Self-pay

## 2021-10-08 ENCOUNTER — Other Ambulatory Visit: Payer: Self-pay | Admitting: Internal Medicine

## 2021-10-08 ENCOUNTER — Other Ambulatory Visit (HOSPITAL_COMMUNITY): Payer: Self-pay

## 2021-10-08 DIAGNOSIS — Z23 Encounter for immunization: Secondary | ICD-10-CM

## 2021-10-08 DIAGNOSIS — B20 Human immunodeficiency virus [HIV] disease: Secondary | ICD-10-CM | POA: Diagnosis not present

## 2021-10-08 MED ORDER — BIKTARVY 50-200-25 MG PO TABS: ORAL_TABLET | ORAL | 5 refills | Status: DC

## 2021-10-08 MED ORDER — TRIAMCINOLONE ACETONIDE 0.025 % EX OINT: 1.0000 "application " | TOPICAL_OINTMENT | Freq: Two times a day (BID) | CUTANEOUS | 11 refills | Status: DC

## 2021-10-08 NOTE — Progress Notes (Addendum)
HPI: Briana [name] is a 46 y.o. female who presents to the [place] clinic for HIV follow-up.  Patient Active Problem List   Diagnosis Date Noted   AKI (acute kidney injury) ([place]) 08/23/2021   Routine screening for STI (sexually transmitted infection) 10/03/2020   History of long-term treatment with high-risk medication 10/03/2020   Impetigo 10/03/2020   Screening for cervical cancer 05/23/2019   Heavy menstrual bleeding 05/23/2019   Vaginal candidiasis 05/23/2019   Thrombocytosis 11/26/2016   Eczema 09/11/2011   History of abnormal cervical Pap smear 01/24/2011   Iron deficiency anemia 01/23/2011   Human immunodeficiency virus (HIV) disease ([place]) 10/19/2006    Patient's Medications  New Prescriptions   No medications on file  Previous Medications   ATENOLOL (TENORMIN) 50 MG TABLET    Take 1 tablet (50 mg total) by mouth daily.   CYANOCOBALAMIN 100 MCG TABLET    Take 1 tablet (100 mcg total) by mouth daily.   FERROUS SULFATE 325 (65 FE) MG TABLET    Take 1 tablet (325 mg total) by mouth daily with breakfast.  Modified Medications   Modified Medication Previous Medication   BICTEGRAVIR-EMTRICITABINE-TENOFOVIR AF (BIKTARVY) 50-200-25 MG TABS TABLET bictegravir-emtricitabine-tenofovir AF (BIKTARVY) 50-200-25 MG TABS tablet      TAKE 1 TABLET DAILY    TAKE 1 TABLET DAILY ( NEED APPOINTMENT FOR FUTURE REFILLS )   TRIAMCINOLONE (KENALOG) 0.025 % OINTMENT triamcinolone (KENALOG) 0.025 % ointment      Apply 1 application topically 2 (two) times daily. As needed    Apply 1 application topically 2 (two) times daily. As needed  Discontinued Medications   No medications on file    Allergies: Allergies  Allergen Reactions   Pumpkin Flavor     Lip swelling after eating pumpkin pie    Past Medical History: Past Medical History:  Diagnosis Date   Abnormal Pap smear    s/p colposcopy    Anemia    Chronic kidney disease 03/2013   kidney infection   HIV infection ([place])     Screening for malignant neoplasm of the cervix     Social History: Social History   Socioeconomic History   Marital status: Single    Spouse name: Not on file   Number of children: Not on file   Years of education: Not on file   Highest education level: Not on file  Occupational History   Not on file  Tobacco Use   Smoking status: Never   Smokeless tobacco: Never  Vaping Use   Vaping Use: Never used  Substance and Sexual Activity   Alcohol use: No   Drug use: No   Sexual activity: Not on file  Other Topics Concern   Not on file  Social History Narrative   Not on file   Social Determinants of Health   Financial Resource Strain: Not on file  Food Insecurity: Not on file  Transportation Needs: Not on file  Physical Activity: Not on file  Stress: Not on file  Social Connections: Not on file    Labs: Lab Results  Component Value Date   HIV1RNAQUANT <20 (H) 04/30/2021   HIV1RNAQUANT <20 (H) 09/19/2020   HIV1RNAQUANT 77 (H) 12/28/2019   CD4TABS 423 04/30/2021   CD4TABS 372 (L) 09/19/2020   CD4TABS 189 (L) 12/28/2019    RPR and STI Lab Results  Component Value Date   LABRPR NON-REACTIVE 04/30/2021   LABRPR NON-REACTIVE 12/28/2019   LABRPR NON-REACTIVE 04/19/2019   LABRPR NON REAC  11/25/2016   LABRPR NON REAC 06/19/2015    STI Results GC GC CT CT  Latest Ref Rng & Units - NEGATIVE - NEGATIVE  04/30/2021 Negative - Negative -  12/28/2019 Negative - Negative -  05/23/2019 Negative - Negative -  04/19/2019 Negative - Negative -  11/25/2016 Negative - Negative -  06/19/2015 Negative - Negative -  05/23/2014 NG: Negative - CT: Negative -  03/09/2013 - NEGATIVE - NEGATIVE  09/14/2010 - - NEGATIVE (NOTE)  Testing performed using the BD ProbeT[employer] Qx Chlamydia trachomatis and Neisseria gonorrhea amplified DNA assay.  Performed at:  [facility] [address], [place] [address]                [id number] -    Hepatitis B Lab Results  Component Value Date   HEPBSAG NON REACTIVE 08/24/2021   Hepatitis C No results found for: HEPCAB, HCVRNAPCRQN Hepatitis A No results found for: HAV Lipids: Lab Results  Component Value Date   CHOL 51 08/24/2021   TRIG 140 08/24/2021   HDL <10 (L) 08/24/2021   CHOLHDL NOT CALCULATED 08/24/2021   VLDL 28 08/24/2021   LDLCALC NOT CALCULATED 08/24/2021    Current HIV Regimen: Biktarvy  Assessment: Biancia presents to clinic today for HIV follow-up. She was last seen by Dr. Linus Salmons in February 2022 and has not experienced any issues with her Biktarvy. She denies missing any doses and fills regularly through [facility]. She is transitioning from her role at the [employer] office to a new role at [employer] next week and will receive new insurance at that time. Asked her to send [place] the updated insurance information when available to ensure she continues to receive her medication appropriately. Patient denies any sexual activity since her last visit, so will defer cytologies today. Will check HIV RNA, CD4, and RPR today. She will follow-up with Dr. Linus Salmons in 6 months; Biktarvy refills sent in to [place].   Patient was recently hospitalized in December for atypical pneumonia and incidental hyperthyroid diagnosis. Multiple labs were collected during this visit, so not collected today. She also stated she wants to find a PCP within the [facility] system soon so that we can see her information as she follows up on these diagnoses; told her to call with any issues. She also requested a refill on her triamcinolone cream that Dr. Linus Salmons prescribes for her ezcema; sent this to [facility] [place].   Patient is due for multiple vaccinations today. She last received her pneumococcal vaccination in January 2021, so Prevnar20 was administered today to complete her series. She demonstrated non-immunity to hepatitis A during her hospitalization, so started her  series today. Second hepatitis A vaccination can be administered at her follow-up with Dr. Linus Salmons. She previously received hepatitis B vaccines as an adult and still displays lack of immunity. Will administer hepatitis B vaccine booster today and check her HBV sAb at next visit. Also due for COVID bivalent booster which was administered today. Patient deferred flu shot at this time.   Plan: Check HIV RNA, CD4, and RPR Refill Biktarvy Refill triamcinolone cream Administer HBV, HAV, Prevnar20, and COVID bivalent booster today Follow-up with Dr. Linus Salmons on 04/11/22 at 11:30am  Alfonse Spruce,  PharmD, CPP Clinical Pharmacist Practitioner [facility] 10/08/2021, 12:34 PM

## 2021-10-08 NOTE — Telephone Encounter (Signed)
Appt 10/08/21

## 2021-10-10 LAB — T-HELPER CELLS (CD4) COUNT (NOT AT ARMC)
Absolute CD4: 585 cells/uL (ref 490–1740)
CD4 T Helper %: 22 % — ABNORMAL LOW (ref 30–61)
Total lymphocyte count: 2605 cells/uL (ref 850–3900)

## 2021-10-10 LAB — RPR: RPR Ser Ql: NONREACTIVE

## 2021-10-10 LAB — HIV-1 RNA QUANT-NO REFLEX-BLD
HIV 1 RNA Quant: <20 Copies/mL — ABNORMAL HIGH
HIV-1 RNA Quant, Log: <1.3 Log cps/mL — ABNORMAL HIGH

## 2021-10-16 ENCOUNTER — Other Ambulatory Visit (HOSPITAL_COMMUNITY): Payer: Self-pay

## 2021-11-07 ENCOUNTER — Ambulatory Visit: Payer: Managed Care, Other (non HMO) | Admitting: Pharmacist

## 2021-12-31 ENCOUNTER — Ambulatory Visit (INDEPENDENT_AMBULATORY_CARE_PROVIDER_SITE_OTHER): Payer: No Typology Code available for payment source | Admitting: Internal Medicine

## 2021-12-31 ENCOUNTER — Encounter: Payer: Self-pay | Admitting: Internal Medicine

## 2021-12-31 VITALS — BP 126/80 | HR 96 | Ht 64.0 in | Wt 187.0 lb

## 2021-12-31 DIAGNOSIS — E0590 Thyrotoxicosis, unspecified without thyrotoxic crisis or storm: Secondary | ICD-10-CM | POA: Diagnosis not present

## 2021-12-31 DIAGNOSIS — R748 Abnormal levels of other serum enzymes: Secondary | ICD-10-CM | POA: Diagnosis not present

## 2021-12-31 LAB — COMPREHENSIVE METABOLIC PANEL
ALT: 10 U/L (ref 0–35)
AST: 15 U/L (ref 0–37)
Albumin: 3.6 g/dL (ref 3.5–5.2)
Alkaline Phosphatase: 154 U/L — ABNORMAL HIGH (ref 39–117)
BUN: 9 mg/dL (ref 6–23)
CO2: 22 mEq/L (ref 19–32)
Calcium: 9.5 mg/dL (ref 8.4–10.5)
Chloride: 104 mEq/L (ref 96–112)
Creatinine, Ser: 0.62 mg/dL (ref 0.40–1.20)
GFR: 108.93 mL/min (ref >60.00)
Glucose, Bld: 108 mg/dL — ABNORMAL HIGH (ref 70–99)
Potassium: 3.7 mEq/L (ref 3.5–5.1)
Sodium: 135 mEq/L (ref 135–145)
Total Bilirubin: 0.2 mg/dL (ref 0.2–1.2)
Total Protein: 8.4 g/dL — ABNORMAL HIGH (ref 6.0–8.3)

## 2021-12-31 LAB — CBC WITH DIFFERENTIAL/PLATELET
Basophils Absolute: 0 10*3/uL (ref 0.0–0.1)
Basophils Relative: 0.3 % (ref 0.0–3.0)
Eosinophils Absolute: 0.4 10*3/uL (ref 0.0–0.7)
Eosinophils Relative: 3.6 % (ref 0.0–5.0)
HCT: 36 % (ref 36.0–46.0)
Hemoglobin: 11.3 g/dL — ABNORMAL LOW (ref 12.0–15.0)
Lymphocytes Relative: 32.6 % (ref 12.0–46.0)
Lymphs Abs: 3.5 10*3/uL (ref 0.7–4.0)
MCHC: 31.5 g/dL (ref 30.0–36.0)
MCV: 73.8 fl — ABNORMAL LOW (ref 78.0–100.0)
Monocytes Absolute: 0.7 10*3/uL (ref 0.1–1.0)
Monocytes Relative: 6.2 % (ref 3.0–12.0)
Neutro Abs: 6.1 10*3/uL (ref 1.4–7.7)
Neutrophils Relative %: 57.3 % (ref 43.0–77.0)
Platelets: 307 10*3/uL (ref 150.0–400.0)
RBC: 4.88 Mil/uL (ref 3.87–5.11)
RDW: 15.6 % — ABNORMAL HIGH (ref 11.5–15.5)
WBC: 10.7 10*3/uL — ABNORMAL HIGH (ref 4.0–10.5)

## 2021-12-31 LAB — T4, FREE: Free T4: 1.89 ng/dL — ABNORMAL HIGH (ref 0.60–1.60)

## 2021-12-31 LAB — TSH: TSH: <0.01 u[IU]/mL — ABNORMAL LOW (ref 0.35–5.50)

## 2021-12-31 MED ORDER — METHIMAZOLE 5 MG PO TABS: 5.0000 mg | ORAL_TABLET | Freq: Two times a day (BID) | ORAL | 1 refills | Status: DC

## 2021-12-31 NOTE — Progress Notes (Signed)
? ? ?Name: Briana [name]  ?MRN/ DOB: [id number], Aug 19, 1976    ?Age/ Sex: 46 y.o., female   ? ?PCP: Patient, No Pcp Per (Inactive)   ?Reason for Endocrinology Evaluation: Hyperthyroidism   ?   ?Date of Initial Endocrinology Evaluation: 12/31/2021   ? ? ?HPI: ?Briana [name] is a 46 y.o. female with a past medical history of HIV. The patient presented for initial endocrinology clinic visit on 12/31/2021 for consultative assistance with her Hyperthyroidism.  ? ?Patient has been diagnosed with hyperthyroidism in December 2022 with a suppressed TSH<0.010  u IU/mL and an elevated free T4 of 4.41 NG/DL, at the time she presented to the ED with nausea vomiting and diarrhea as well as AKI and atypical pneumonia.  She was placed on low dose beta-blockers. ? ?Today she does not recall taking Atenolol  ?She denies weight loss  ?Denies loose stools or diarrhea  ?Denies tremors  ?Denies local neck swelling  ?Denies palpitations ?Denies recent episodes of nausea and vomiting  ? ? ?Denies FH of thyroid disease  ? ? ? ? ?HISTORY:  ?Past Medical History:  ?Past Medical History:  ?Diagnosis Date  ? Abnormal Pap smear   ? s/p colposcopy   ? Anemia   ? Chronic kidney disease 03/2013  ? kidney infection  ? HIV infection ([place])   ? Screening for malignant neoplasm of the cervix   ? ?Past Surgical History:  ?Past Surgical History:  ?Procedure Laterality Date  ? CESAREAN SECTION    ? 2000/2008  ? COSMETIC SURGERY    ? 2 previous c-sections  ? TUBAL LIGATION    ?  ?Social History:  reports that she has never smoked. She has never used smokeless tobacco. She reports that she does not drink alcohol and does not use drugs. ?Family History: family history includes Diabetes in her father, maternal grandfather, maternal grandmother, paternal grandfather, and paternal grandmother; Eczema in her mother; Hypertension in her father, maternal grandfather, maternal grandmother, paternal grandfather, and paternal grandmother. ? ? ?HOME  MEDICATIONS: ?Allergies as of 12/31/2021   ? ?   Reactions  ? Pumpkin Flavor   ? Lip swelling after eating pumpkin pie  ? ?  ? ?  ?Medication List  ?  ? ?  ? Accurate as of Dec 31, 2021  8:34 AM. If you have any questions, ask your nurse or doctor.  ?  ?  ? ?  ? ?atenolol 50 MG tablet ?Commonly known as: Tenormin ?Take 1 tablet (50 mg total) by mouth daily. ?  ?Biktarvy 50-200-25 MG Tabs tablet ?Generic drug: bictegravir-emtricitabine-tenofovir AF ?TAKE 1 TABLET DAILY ?  ?cyanocobalamin 100 MCG tablet ?Take 1 tablet (100 mcg total) by mouth daily. ?  ?ferrous sulfate 325 (65 FE) MG tablet ?Take 1 tablet (325 mg total) by mouth daily with breakfast. ?  ?triamcinolone 0.025 % ointment ?Commonly known as: KENALOG ?Apply 1 application topically 2 (two) times daily. As needed ?  ? ?  ?  ? ? ?REVIEW OF SYSTEMS: ?A comprehensive ROS was conducted with the patient and is negative except as per HPI  ? ? ? ?OBJECTIVE:  ?VS: BP 126/80 (BP Location: Left Arm, Patient Position: Sitting, Cuff Size: Large)   Pulse 96   Ht '5\' 4"'$  (1.626 m)   Wt 187 lb (84.8 kg)   SpO2 97%   BMI 32.10 kg/m?   ? ?Wt Readings from Last 3 Encounters:  ?12/31/21 187 lb (84.8 kg)  ?08/24/21 157 lb 6.5 oz (71.4  kg)  ?08/16/21 180 lb (81.6 kg)  ? ? ? ?EXAM: ?General: Pt appears well and is in NAD  ?Neck: General: Supple without adenopathy. ?Thyroid: Thyroid size normal.  No goiter or nodules appreciated.   ?Lungs: Clear with good BS bilat with no rales, rhonchi, or wheezes  ?Heart: Auscultation: RRR.  ?Abdomen: Normoactive bowel sounds, soft, nontender, without masses or organomegaly palpable  ?Extremities:  ?BL LE: No pretibial edema normal ROM and strength.  ?Mental Status: Judgment, insight: Intact ?Orientation: Oriented to time, place, and person ?Mood and affect: No depression, anxiety, or agitation  ? ? ? ?DATA REVIEWED: ? ?  ? Latest Reference Range & Units 12/31/21 08:51  ?Sodium 135 - 145 mEq/L 135  ?Potassium 3.5 - 5.1 mEq/L 3.7  ?Chloride 96 -  112 mEq/L 104  ?CO2 19 - 32 mEq/L 22  ?Glucose 70 - 99 mg/dL 108 (H)  ?BUN 6 - 23 mg/dL 9  ?Creatinine 0.40 - 1.20 mg/dL 0.62  ?Calcium 8.4 - 10.5 mg/dL 9.5  ?Alkaline Phosphatase 39 - 117 U/L 154 (H)  ?Albumin 3.5 - 5.2 g/dL 3.6  ?AST 0 - 37 U/L 15  ?ALT 0 - 35 U/L 10  ?Total Protein 6.0 - 8.3 g/dL 8.4 (H)  ?Total Bilirubin 0.2 - 1.2 mg/dL 0.2  ?GFR >60.00 mL/min 108.93  ? ? Latest Reference Range & Units 12/31/21 08:51  ?WBC 4.0 - 10.5 K/uL 10.7 (H)  ?RBC 3.87 - 5.11 Mil/uL 4.88  ?Hemoglobin 12.0 - 15.0 g/dL 11.3 (L)  ?HCT 36.0 - 46.0 % 36.0  ?MCV 78.0 - 100.0 fl 73.8 (L)  ?MCHC 30.0 - 36.0 g/dL 31.5  ?RDW 11.5 - 15.5 % 15.6 (H)  ?Platelets 150.0 - 400.0 K/uL 307.0  ?Neutrophils 43.0 - 77.0 % 57.3  ?Lymphocytes 12.0 - 46.0 % 32.6  ?Monocytes Relative 3.0 - 12.0 % 6.2  ?Eosinophil 0.0 - 5.0 % 3.6  ?Basophil 0.0 - 3.0 % 0.3  ?NEUT# 1.4 - 7.7 K/uL 6.1  ?Lymphocyte # 0.7 - 4.0 K/uL 3.5  ?Monocyte # 0.1 - 1.0 K/uL 0.7  ?Eosinophils Absolute 0.0 - 0.7 K/uL 0.4  ?Basophils Absolute 0.0 - 0.1 K/uL 0.0  ? ? Latest Reference Range & Units 12/31/21 08:51  ?TSH 0.35 - 5.50 uIU/mL <0.01 Repeated and verified X2. (L)  ?T4,Free(Direct) 0.60 - 1.60 ng/dL 1.89 (H)  ? ? ?ASSESSMENT/PLAN/RECOMMENDATIONS:  ? ?Hyperthyroidism: ? ?-Patient is clinically euthyroid but biochemically continues to be hyperthyroid ?-We discussed differential diagnosis to include Graves' disease versus toxic thyroid nodule ?-We will proceed with TRAb ?-No local neck symptoms ?-We will stop methimazole as below ? ?Medications : ? ?Start methimazole 5 mg twice daily ? ? ? ?2.  Elevated alkaline phosphatase: ? ? ?-This is most likely due to increased bone resorption due to hyperthyroidism ?-We will continue to monitor at this time ? ?Follow-up in 4 months ? ? ?Signed electronically by: ?Abby Nena Jordan, MD ? ?[facility]  ?[facility] ?[address] ?[place], [place] [address] ?Phone: [phone] ?FAX:  [phone] ? ? ?CC: ?Patient, No Pcp Per (Inactive) ?No address on file ?Phone: None ?Fax: None ? ? ?Return to Endocrinology clinic as below: ?Future Appointments  ?Date Time Provider [place]  ?04/11/2022 11:30 AM Comer, Okey Regal, MD [facility]  ?  ? ? ? ? ? ?

## 2022-01-04 LAB — T3: T3, Total: 345 ng/dL — ABNORMAL HIGH (ref 76–181)

## 2022-01-04 LAB — TRAB (TSH RECEPTOR BINDING ANTIBODY): TRAB: 26.25 IU/L — ABNORMAL HIGH (ref <=2.00)

## 2022-03-31 ENCOUNTER — Telehealth: Payer: Self-pay

## 2022-03-31 ENCOUNTER — Other Ambulatory Visit (HOSPITAL_COMMUNITY): Payer: Self-pay

## 2022-03-31 NOTE — Telephone Encounter (Signed)
[facility] Patient Advocate Encounter   Received notification from Rx Benefits that prior authorization for Briana [name] is required.   PA submitted on 03/31/2022 Key [id number] Status is pending    [facility] will continue to follow.   Ileene Patrick, [facility] Patient [facility] Phone: [phone] Fax:  [phone]

## 2022-03-31 NOTE — Telephone Encounter (Signed)
Looks like to me every specialty med will need a PA ... I will start one and give her an update.

## 2022-04-01 ENCOUNTER — Telehealth: Payer: Self-pay

## 2022-04-01 ENCOUNTER — Other Ambulatory Visit (HOSPITAL_COMMUNITY): Payer: Self-pay

## 2022-04-01 NOTE — Telephone Encounter (Signed)
[facility] Patient Advocate Encounter  Prior Authorization for Briana [name] has been approved.    PA# [id number] Effective dates: 03/31/22 through 03/31/23  Patients co-pay is $65.00.   [facility] will continue to follow.  Briana [name], [facility] Patient [facility] Phone: [phone] Fax:  [phone]

## 2022-04-11 ENCOUNTER — Ambulatory Visit: Payer: Managed Care, Other (non HMO) | Admitting: Internal Medicine

## 2022-04-16 ENCOUNTER — Ambulatory Visit: Payer: Managed Care, Other (non HMO) | Admitting: Internal Medicine

## 2022-04-29 ENCOUNTER — Encounter: Payer: Self-pay | Admitting: Internal Medicine

## 2022-04-29 ENCOUNTER — Ambulatory Visit (INDEPENDENT_AMBULATORY_CARE_PROVIDER_SITE_OTHER): Payer: No Typology Code available for payment source | Admitting: Internal Medicine

## 2022-04-29 ENCOUNTER — Other Ambulatory Visit: Payer: Self-pay

## 2022-04-29 DIAGNOSIS — I10 Essential (primary) hypertension: Secondary | ICD-10-CM

## 2022-04-29 DIAGNOSIS — L309 Dermatitis, unspecified: Secondary | ICD-10-CM | POA: Diagnosis not present

## 2022-04-29 DIAGNOSIS — B20 Human immunodeficiency virus [HIV] disease: Secondary | ICD-10-CM

## 2022-04-29 MED ORDER — BIKTARVY 50-200-25 MG PO TABS: ORAL_TABLET | ORAL | 11 refills | Status: DC

## 2022-04-29 MED ORDER — TRIAMCINOLONE ACETONIDE 0.1 % EX CREA: 1.0000 | TOPICAL_CREAM | Freq: Two times a day (BID) | CUTANEOUS | 11 refills | Status: DC

## 2022-04-29 NOTE — Assessment & Plan Note (Signed)
Continued issues with rash and will refill the steroid cream at 0.1% triamcinolone.

## 2022-04-29 NOTE — Progress Notes (Signed)
   Subjective:    Patient ID: Briana [name], female    DOB: 06/12/1976, 46 y.o.   MRN: [id number]  HPI Here for follow up of HIV She continues on Biktarvy and no issues.  She did run out after changing jobs and insurance for about 2 weeks several months ago but on it since.  No new issues.  Having issues with the same rash on her right arm mainly, a little bit on the left arm.     Review of Systems  Constitutional:  Negative for fatigue.  Skin:  Positive for rash.       + itch  Neurological:  Negative for headaches.       Objective:   Physical Exam Eyes:     General: No scleral icterus. Pulmonary:     Effort: Pulmonary effort is normal.  Skin:    Findings: No rash.  Neurological:     General: No focal deficit present.     Mental Status: She is alert.   SH: no tobacco        Assessment & Plan:

## 2022-04-29 NOTE — Assessment & Plan Note (Signed)
Elevated today.  She will continue to monitor and discuss with her pcp if it remains elevated

## 2022-04-29 NOTE — Assessment & Plan Note (Signed)
Doing well overall and will continue with the same regimen.  No concerns, labs today and follow up in 6 months.

## 2022-04-30 LAB — T-HELPER CELL (CD4) - (RCID CLINIC ONLY)
CD4 % Helper T Cell: 25 % — ABNORMAL LOW (ref 33–65)
CD4 T Cell Abs: 649 /uL (ref 400–1790)

## 2022-05-01 LAB — HIV-1 RNA QUANT-NO REFLEX-BLD
HIV 1 RNA Quant: <20 Copies/mL — ABNORMAL HIGH
HIV-1 RNA Quant, Log: <1.3 Log cps/mL — ABNORMAL HIGH

## 2022-05-06 NOTE — Progress Notes (Deleted)
Name: Briana [name]  MRN/ DOB: [id number], 09/10/1975    Age/ Sex: 46 y.o., female    PCP: Patient, No Pcp Per   Reason for Endocrinology Evaluation: Hyperthyroidism      Date of Initial Endocrinology Evaluation: 12/31/2021    HPI: [name] is a 46 y.o. female with a past medical history of HIV. The patient presented for initial endocrinology clinic visit on 12/31/2021 for consultative assistance with her Hyperthyroidism.   Patient has been diagnosed with hyperthyroidism in December 2022 with a suppressed TSH<0.010  u IU/mL and an elevated free T4 of 4.41 NG/DL, at the time she presented to the ED with nausea vomiting and diarrhea as well as AKI and atypical pneumonia.  She was placed on low dose beta-blockers.    On her initial visit to our clinic she was biochemically hyperthyroid and was started on methimazole    TRAb elevated at 26.25 IU/L   Denies FH of thyroid disease    SUBJECTIVE:    Today ('@TD'$ @):  Ms.Briana [name] is here for a follow up on hyperthyroidism.    She continues to follow-up with infectious disease for HIV treatment  Today she does not recall taking Atenolol  She denies weight loss  Denies loose stools or diarrhea  Denies tremors  Denies local neck swelling  Denies palpitations Denies recent episodes of nausea and vomiting    Methimazole 5 mg twice daily   HISTORY:  Past Medical History:  Past Medical History:  Diagnosis Date   Abnormal Pap smear    s/p colposcopy    Anemia    Chronic kidney disease 03/2013   kidney infection   HIV infection ([place])    Screening for malignant neoplasm of the cervix    Past Surgical History:  Past Surgical History:  Procedure Laterality Date   CESAREAN SECTION     2000/2008   COSMETIC SURGERY     2 previous c-sections   TUBAL LIGATION      Social History:  reports that she has never smoked. She has never used smokeless tobacco. She reports that she does not drink alcohol and does not use  drugs. Family History: family history includes Diabetes in her father, maternal grandfather, maternal grandmother, paternal grandfather, and paternal grandmother; Eczema in her mother; Hypertension in her father, maternal grandfather, maternal grandmother, paternal grandfather, and paternal grandmother.   HOME MEDICATIONS: Allergies as of 05/07/2022       Reactions   Pumpkin Flavor    Lip swelling after eating pumpkin pie        Medication List        Accurate as of May 06, 2022 10:04 AM. If you have any questions, ask your nurse or doctor.          atenolol 50 MG tablet Commonly known as: Tenormin Take 1 tablet (50 mg total) by mouth daily.   Biktarvy 50-200-25 MG Tabs tablet Generic drug: bictegravir-emtricitabine-tenofovir AF TAKE 1 TABLET DAILY   cyanocobalamin 100 MCG tablet Take 1 tablet (100 mcg total) by mouth daily.   ferrous sulfate 325 (65 FE) MG tablet Take 1 tablet (325 mg total) by mouth daily with breakfast.   methimazole 5 MG tablet Commonly known as: TAPAZOLE Take 1 tablet (5 mg total) by mouth 2 (two) times daily.   triamcinolone 0.025 % ointment Commonly known as: KENALOG Apply 1 application topically 2 (two) times daily. As needed   triamcinolone cream 0.1 % Commonly known as: KENALOG Apply 1 Application  topically 2 (two) times daily.          REVIEW OF SYSTEMS: A comprehensive ROS was conducted with the patient and is negative except as per HPI     OBJECTIVE:  VS: LMP 03/27/2022 (Exact Date)    Wt Readings from Last 3 Encounters:  04/29/22 172 lb (78 kg)  12/31/21 187 lb (84.8 kg)  08/24/21 157 lb 6.5 oz (71.4 kg)     EXAM: General: Pt appears well and is in NAD  Neck: General: Supple without adenopathy. Thyroid: Thyroid size normal.  No goiter or nodules appreciated.   Lungs: Clear with good BS bilat with no rales, rhonchi, or wheezes  Heart: Auscultation: RRR.  Abdomen: Normoactive bowel sounds, soft, nontender,  without masses or organomegaly palpable  Extremities:  BL LE: No pretibial edema normal ROM and strength.  Mental Status: Judgment, insight: Intact Orientation: Oriented to time, place, and person Mood and affect: No depression, anxiety, or agitation     DATA REVIEWED:     Latest Reference Range & Units 12/31/21 08:51  Sodium 135 - 145 mEq/L 135  Potassium 3.5 - 5.1 mEq/L 3.7  Chloride 96 - 112 mEq/L 104  CO2 19 - 32 mEq/L 22  Glucose 70 - 99 mg/dL 108 (H)  BUN 6 - 23 mg/dL 9  Creatinine 0.40 - 1.20 mg/dL 0.62  Calcium 8.4 - 10.5 mg/dL 9.5  Alkaline Phosphatase 39 - 117 U/L 154 (H)  Albumin 3.5 - 5.2 g/dL 3.6  AST 0 - 37 U/L 15  ALT 0 - 35 U/L 10  Total Protein 6.0 - 8.3 g/dL 8.4 (H)  Total Bilirubin 0.2 - 1.2 mg/dL 0.2  GFR >60.00 mL/min 108.93    Latest Reference Range & Units 12/31/21 08:51  WBC 4.0 - 10.5 K/uL 10.7 (H)  RBC 3.87 - 5.11 Mil/uL 4.88  Hemoglobin 12.0 - 15.0 g/dL 11.3 (L)  HCT 36.0 - 46.0 % 36.0  MCV 78.0 - 100.0 fl 73.8 (L)  MCHC 30.0 - 36.0 g/dL 31.5  RDW 11.5 - 15.5 % 15.6 (H)  Platelets 150.0 - 400.0 K/uL 307.0  Neutrophils 43.0 - 77.0 % 57.3  Lymphocytes 12.0 - 46.0 % 32.6  Monocytes Relative 3.0 - 12.0 % 6.2  Eosinophil 0.0 - 5.0 % 3.6  Basophil 0.0 - 3.0 % 0.3  NEUT# 1.4 - 7.7 K/uL 6.1  Lymphocyte # 0.7 - 4.0 K/uL 3.5  Monocyte # 0.1 - 1.0 K/uL 0.7  Eosinophils Absolute 0.0 - 0.7 K/uL 0.4  Basophils Absolute 0.0 - 0.1 K/uL 0.0    Latest Reference Range & Units 12/31/21 08:51  TSH 0.35 - 5.50 uIU/mL <0.01 Repeated and verified X2. (L)  T4,Free(Direct) 0.60 - 1.60 ng/dL 1.89 (H)    ASSESSMENT/PLAN/RECOMMENDATIONS:   Hyperthyroidism:  -Patient is clinically euthyroid but biochemically continues to be hyperthyroid -We discussed differential diagnosis to include Graves' disease versus toxic thyroid nodule -We will proceed with TRAb -No local neck symptoms -We will stop methimazole as below  Medications :  Start methimazole 5 mg  twice daily  2. Graves' Disease:    3.  Elevated alkaline phosphatase:   -This is most likely due to increased bone resorption due to hyperthyroidism -We will continue to monitor at this time  Follow-up in 4 months   Signed electronically by: Mack Guise, MD  [facility]  [facility] [address] [place], [place] [address] Phone: [phone] FAX: [phone]   CC: Patient, No Pcp Per No address  on file Phone: None Fax: None   Return to Endocrinology clinic as below: Future Appointments  Date Time Provider [place]  05/07/2022  8:50 AM Thurlow Gallaga, Melanie Crazier, MD [facility] None  10/16/2022 10:30 AM [facility]  10/30/2022 10:30 AM Comer, Okey Regal, MD [facility]

## 2022-05-07 ENCOUNTER — Ambulatory Visit: Payer: No Typology Code available for payment source | Admitting: Internal Medicine

## 2022-06-30 ENCOUNTER — Other Ambulatory Visit: Payer: Self-pay | Admitting: Pharmacist

## 2022-06-30 DIAGNOSIS — B20 Human immunodeficiency virus [HIV] disease: Secondary | ICD-10-CM

## 2022-08-04 ENCOUNTER — Other Ambulatory Visit: Payer: Self-pay

## 2022-08-04 DIAGNOSIS — B20 Human immunodeficiency virus [HIV] disease: Secondary | ICD-10-CM

## 2022-08-04 MED ORDER — BIKTARVY 50-200-25 MG PO TABS: ORAL_TABLET | ORAL | 5 refills | Status: DC

## 2022-10-16 ENCOUNTER — Other Ambulatory Visit: Payer: No Typology Code available for payment source

## 2022-10-16 ENCOUNTER — Other Ambulatory Visit: Payer: Self-pay

## 2022-10-16 DIAGNOSIS — Z124 Encounter for screening for malignant neoplasm of cervix: Secondary | ICD-10-CM

## 2022-10-16 DIAGNOSIS — Z79899 Other long term (current) drug therapy: Secondary | ICD-10-CM

## 2022-10-16 DIAGNOSIS — B20 Human immunodeficiency virus [HIV] disease: Secondary | ICD-10-CM

## 2022-10-16 DIAGNOSIS — Z113 Encounter for screening for infections with a predominantly sexual mode of transmission: Secondary | ICD-10-CM

## 2022-10-17 ENCOUNTER — Other Ambulatory Visit (HOSPITAL_COMMUNITY)
Admission: RE | Admit: 2022-10-17 | Discharge: 2022-10-17 | Disposition: A | Discharge status: Home / Self Care | Payer: No Typology Code available for payment source | Source: Ambulatory Visit | Attending: Internal Medicine | Admitting: Internal Medicine

## 2022-10-17 ENCOUNTER — Other Ambulatory Visit: Payer: Self-pay

## 2022-10-17 ENCOUNTER — Other Ambulatory Visit: Payer: Self-pay | Admitting: Internal Medicine

## 2022-10-17 ENCOUNTER — Other Ambulatory Visit: Payer: No Typology Code available for payment source

## 2022-10-17 DIAGNOSIS — B20 Human immunodeficiency virus [HIV] disease: Secondary | ICD-10-CM

## 2022-10-17 DIAGNOSIS — Z113 Encounter for screening for infections with a predominantly sexual mode of transmission: Secondary | ICD-10-CM | POA: Diagnosis not present

## 2022-10-17 DIAGNOSIS — Z79899 Other long term (current) drug therapy: Secondary | ICD-10-CM

## 2022-10-20 ENCOUNTER — Telehealth: Payer: Self-pay

## 2022-10-20 LAB — CBC WITH DIFFERENTIAL/PLATELET
Absolute Monocytes: 665 cells/uL (ref 200–950)
Basophils Absolute: 57 cells/uL (ref 0–200)
Basophils Relative: 0.6 %
Eosinophils Absolute: 466 cells/uL (ref 15–500)
Eosinophils Relative: 4.9 %
HCT: 33.9 % — ABNORMAL LOW (ref 35.0–45.0)
Hemoglobin: 11 g/dL — ABNORMAL LOW (ref 11.7–15.5)
Lymphs Abs: 3857 cells/uL (ref 850–3900)
MCH: 23 pg — ABNORMAL LOW (ref 27.0–33.0)
MCHC: 32.4 g/dL (ref 32.0–36.0)
MCV: 70.8 fL — ABNORMAL LOW (ref 80.0–100.0)
MPV: 10.5 fL (ref 7.5–12.5)
Monocytes Relative: 7 %
Neutro Abs: 4456 cells/uL (ref 1500–7800)
Neutrophils Relative %: 46.9 %
Platelets: 371 10*3/uL (ref 140–400)
RBC: 4.79 10*6/uL (ref 3.80–5.10)
RDW: 15 % (ref 11.0–15.0)
Total Lymphocyte: 40.6 %
WBC: 9.5 10*3/uL (ref 3.8–10.8)

## 2022-10-20 LAB — COMPLETE METABOLIC PANEL WITH GFR
AG Ratio: 0.8 (calc) — ABNORMAL LOW (ref 1.0–2.5)
ALT: 12 U/L (ref 6–29)
AST: 15 U/L (ref 10–30)
Albumin: 3.9 g/dL (ref 3.6–5.1)
Alkaline phosphatase (APISO): 175 U/L — ABNORMAL HIGH (ref 31–125)
BUN: 10 mg/dL (ref 7–25)
CO2: 21 mmol/L (ref 20–32)
Calcium: 9.5 mg/dL (ref 8.6–10.2)
Chloride: 108 mmol/L (ref 98–110)
Creat: 0.62 mg/dL (ref 0.50–0.99)
Globulin: 4.7 g/dL (calc) — ABNORMAL HIGH (ref 1.9–3.7)
Glucose, Bld: 92 mg/dL (ref 65–99)
Potassium: 4 mmol/L (ref 3.5–5.3)
Sodium: 138 mmol/L (ref 135–146)
Total Bilirubin: 0.2 mg/dL (ref 0.2–1.2)
Total Protein: 8.6 g/dL — ABNORMAL HIGH (ref 6.1–8.1)
eGFR: 113 mL/min/{1.73_m2} (ref >=60)

## 2022-10-20 LAB — T-HELPER CELLS (CD4) COUNT (NOT AT ARMC)
Absolute CD4: 1093 cells/uL (ref 490–1740)
CD4 T Helper %: 27 % — ABNORMAL LOW (ref 30–61)
Total lymphocyte count: 3979 cells/uL — ABNORMAL HIGH (ref 850–3900)

## 2022-10-20 LAB — HIV-1 RNA QUANT-NO REFLEX-BLD
HIV 1 RNA Quant: NOT DETECTED Copies/mL
HIV-1 RNA Quant, Log: NOT DETECTED Log cps/mL

## 2022-10-20 LAB — LIPID PANEL
Cholesterol: 180 mg/dL (ref <200)
HDL: 52 mg/dL (ref >=50)
LDL Cholesterol (Calc): 109 mg/dL (calc) — ABNORMAL HIGH
Non-HDL Cholesterol (Calc): 128 mg/dL (calc) (ref <130)
Total CHOL/HDL Ratio: 3.5 (calc) (ref <5.0)
Triglycerides: 101 mg/dL (ref <150)

## 2022-10-20 LAB — URINE CYTOLOGY ANCILLARY ONLY
Chlamydia: NEGATIVE
Comment: NEGATIVE
Comment: NORMAL
Neisseria Gonorrhea: POSITIVE — AB

## 2022-10-20 LAB — RPR: RPR Ser Ql: NONREACTIVE

## 2022-10-20 NOTE — Telephone Encounter (Signed)
Patient aware, scheduled for treatment on 10/21/2022 at 10:30 AM.    Tomi Bamberger, CMA

## 2022-10-20 NOTE — Telephone Encounter (Signed)
-----   Message from Thayer Headings, MD sent at 10/20/2022  3:10 PM EST ----- She is positive for gonorrhea if we can get her in asap for 500 mg IM ceftriaxone.  thanks

## 2022-10-21 ENCOUNTER — Other Ambulatory Visit: Payer: Self-pay

## 2022-10-21 ENCOUNTER — Ambulatory Visit (INDEPENDENT_AMBULATORY_CARE_PROVIDER_SITE_OTHER): Payer: No Typology Code available for payment source

## 2022-10-21 DIAGNOSIS — A549 Gonococcal infection, unspecified: Secondary | ICD-10-CM | POA: Diagnosis not present

## 2022-10-21 MED ORDER — CEFTRIAXONE SODIUM 500 MG IJ SOLR
500.0000 mg | Freq: Once | INTRAMUSCULAR | Status: AC
  Administered 2022-10-21: 500 mg via INTRAMUSCULAR

## 2022-10-30 ENCOUNTER — Ambulatory Visit: Payer: No Typology Code available for payment source | Admitting: Internal Medicine

## 2022-11-11 ENCOUNTER — Ambulatory Visit: Payer: No Typology Code available for payment source | Admitting: Internal Medicine

## 2022-11-11 ENCOUNTER — Other Ambulatory Visit: Payer: Self-pay | Admitting: Internal Medicine

## 2022-11-11 DIAGNOSIS — B20 Human immunodeficiency virus [HIV] disease: Secondary | ICD-10-CM

## 2022-11-12 ENCOUNTER — Telehealth: Payer: Self-pay

## 2022-11-12 DIAGNOSIS — B20 Human immunodeficiency virus [HIV] disease: Secondary | ICD-10-CM

## 2022-11-12 MED ORDER — TRIAMCINOLONE ACETONIDE 0.025 % EX OINT: 1.0000 | TOPICAL_OINTMENT | Freq: Two times a day (BID) | CUTANEOUS | 1 refills | Status: DC

## 2022-11-12 NOTE — Telephone Encounter (Signed)
Patient called requesting refill of triamcinolone ointment. Will route to provider.   Beryle Flock, RN

## 2022-11-12 NOTE — Addendum Note (Signed)
Addended by: Lucie Leather D on: 11/12/2022 10:16 AM   Modules accepted: Orders

## 2022-11-14 ENCOUNTER — Ambulatory Visit: Payer: No Typology Code available for payment source | Admitting: Internal Medicine

## 2022-11-25 ENCOUNTER — Ambulatory Visit: Payer: No Typology Code available for payment source | Admitting: Internal Medicine

## 2022-11-27 ENCOUNTER — Ambulatory Visit: Payer: No Typology Code available for payment source | Admitting: Internal Medicine

## 2022-12-31 DIAGNOSIS — C50919 Malignant neoplasm of unspecified site of unspecified female breast: Secondary | ICD-10-CM

## 2022-12-31 HISTORY — DX: Malignant neoplasm of unspecified site of unspecified female breast: C50.919

## 2023-01-08 ENCOUNTER — Encounter: Payer: Self-pay | Admitting: Internal Medicine

## 2023-01-08 ENCOUNTER — Other Ambulatory Visit: Payer: Self-pay

## 2023-01-08 ENCOUNTER — Ambulatory Visit (INDEPENDENT_AMBULATORY_CARE_PROVIDER_SITE_OTHER): Payer: No Typology Code available for payment source | Admitting: Internal Medicine

## 2023-01-08 VITALS — BP 162/106 | HR 92 | Temp 97.6°F | Ht 65.0 in | Wt 183.0 lb

## 2023-01-08 DIAGNOSIS — N63 Unspecified lump in unspecified breast: Secondary | ICD-10-CM

## 2023-01-08 DIAGNOSIS — Z79899 Other long term (current) drug therapy: Secondary | ICD-10-CM

## 2023-01-08 DIAGNOSIS — L309 Dermatitis, unspecified: Secondary | ICD-10-CM

## 2023-01-08 DIAGNOSIS — B20 Human immunodeficiency virus [HIV] disease: Secondary | ICD-10-CM | POA: Diagnosis not present

## 2023-01-08 DIAGNOSIS — Z113 Encounter for screening for infections with a predominantly sexual mode of transmission: Secondary | ICD-10-CM | POA: Diagnosis not present

## 2023-01-08 DIAGNOSIS — I10 Essential (primary) hypertension: Secondary | ICD-10-CM

## 2023-01-08 MED ORDER — TRIAMCINOLONE ACETONIDE 0.025 % EX OINT: 1.0000 | TOPICAL_OINTMENT | Freq: Two times a day (BID) | CUTANEOUS | 10 refills | Status: DC

## 2023-01-08 MED ORDER — BIKTARVY 50-200-25 MG PO TABS: ORAL_TABLET | ORAL | 11 refills | Status: DC

## 2023-01-08 MED ORDER — BIKTARVY 50-200-25 MG PO TABS
ORAL_TABLET | ORAL | 11 refills | Status: DC
Start: 2023-01-08 — End: 2023-12-28

## 2023-01-08 NOTE — Assessment & Plan Note (Signed)
New finding and she has follow up with gynecology.

## 2023-01-08 NOTE — Progress Notes (Signed)
   Subjective:    Patient ID: Briana [name], female    DOB: 05/25/1976, 47 y.o.   MRN: [id number]  HPI Briana [name] is here for routine follow up of HIV. She continues on McBride and has had no issues.  She continues to work nights at [employer].  She has noted a breast lump recently and has an appointment with gynecology.  Has established with a PCP now with a new appointment next week.     Review of Systems  Constitutional:  Negative for fatigue.  Gastrointestinal:  Negative for diarrhea.  Skin:  Positive for rash.       Objective:   Physical Exam Eyes:     General: No scleral icterus. Pulmonary:     Effort: Pulmonary effort is normal.  Neurological:     Mental Status: She is alert.   SH: no tobacco        Assessment & Plan:

## 2023-01-08 NOTE — Assessment & Plan Note (Signed)
I have refilled the triamcinolone.

## 2023-01-08 NOTE — Assessment & Plan Note (Signed)
Elevated again and she will discuss with her new PCP

## 2023-01-08 NOTE — Assessment & Plan Note (Addendum)
She continues to do well on Biktarvy with no issues.  Good viral suppression and labs reviewed with her.  Refillls provided and she can rtc in 6 months.   Will discuss Reprieve study and benefits of a statin next visit.

## 2023-01-14 ENCOUNTER — Other Ambulatory Visit: Payer: No Typology Code available for payment source

## 2023-01-14 ENCOUNTER — Ambulatory Visit (INDEPENDENT_AMBULATORY_CARE_PROVIDER_SITE_OTHER): Payer: No Typology Code available for payment source | Admitting: Family Medicine

## 2023-01-14 ENCOUNTER — Encounter: Payer: Self-pay | Admitting: Family Medicine

## 2023-01-14 VITALS — BP 166/92 | HR 104 | Ht 65.0 in | Wt 182.0 lb

## 2023-01-14 DIAGNOSIS — R030 Elevated blood-pressure reading, without diagnosis of hypertension: Secondary | ICD-10-CM | POA: Diagnosis not present

## 2023-01-14 DIAGNOSIS — D649 Anemia, unspecified: Secondary | ICD-10-CM

## 2023-01-14 DIAGNOSIS — Z0000 Encounter for general adult medical examination without abnormal findings: Secondary | ICD-10-CM

## 2023-01-14 DIAGNOSIS — E871 Hypo-osmolality and hyponatremia: Secondary | ICD-10-CM

## 2023-01-14 DIAGNOSIS — R748 Abnormal levels of other serum enzymes: Secondary | ICD-10-CM

## 2023-01-14 DIAGNOSIS — E0590 Thyrotoxicosis, unspecified without thyrotoxic crisis or storm: Secondary | ICD-10-CM

## 2023-01-14 DIAGNOSIS — R946 Abnormal results of thyroid function studies: Secondary | ICD-10-CM | POA: Diagnosis not present

## 2023-01-14 DIAGNOSIS — N6321 Unspecified lump in the left breast, upper outer quadrant: Secondary | ICD-10-CM

## 2023-01-14 DIAGNOSIS — D509 Iron deficiency anemia, unspecified: Secondary | ICD-10-CM

## 2023-01-14 DIAGNOSIS — R195 Other fecal abnormalities: Secondary | ICD-10-CM | POA: Diagnosis not present

## 2023-01-14 DIAGNOSIS — R21 Rash and other nonspecific skin eruption: Secondary | ICD-10-CM | POA: Diagnosis not present

## 2023-01-14 LAB — CBC WITH DIFFERENTIAL/PLATELET
Basophils Absolute: 0.1 10*3/uL (ref 0.0–0.1)
Basophils Relative: 0.6 % (ref 0.0–3.0)
Eosinophils Absolute: 0.4 10*3/uL (ref 0.0–0.7)
Eosinophils Relative: 4.2 % (ref 0.0–5.0)
HCT: 35.3 % — ABNORMAL LOW (ref 36.0–46.0)
Hemoglobin: 11.1 g/dL — ABNORMAL LOW (ref 12.0–15.0)
Lymphocytes Relative: 33.9 % (ref 12.0–46.0)
Lymphs Abs: 3.3 10*3/uL (ref 0.7–4.0)
MCHC: 31.3 g/dL (ref 30.0–36.0)
MCV: 72.1 fl — ABNORMAL LOW (ref 78.0–100.0)
Monocytes Absolute: 0.8 10*3/uL (ref 0.1–1.0)
Monocytes Relative: 8 % (ref 3.0–12.0)
Neutro Abs: 5.1 10*3/uL (ref 1.4–7.7)
Neutrophils Relative %: 53.3 % (ref 43.0–77.0)
Platelets: 340 10*3/uL (ref 150.0–400.0)
RBC: 4.89 Mil/uL (ref 3.87–5.11)
RDW: 15.5 % (ref 11.5–15.5)
WBC: 9.6 10*3/uL (ref 4.0–10.5)

## 2023-01-14 LAB — IBC PANEL
Iron: 26 ug/dL — ABNORMAL LOW (ref 42–145)
Saturation Ratios: 5.9 % — ABNORMAL LOW (ref 20.0–50.0)
TIBC: 439.6 ug/dL (ref 250.0–450.0)
Transferrin: 314 mg/dL (ref 212.0–360.0)

## 2023-01-14 LAB — COMPREHENSIVE METABOLIC PANEL
ALT: 11 U/L (ref 0–35)
AST: 15 U/L (ref 0–37)
Albumin: 3.7 g/dL (ref 3.5–5.2)
Alkaline Phosphatase: 168 U/L — ABNORMAL HIGH (ref 39–117)
BUN: 10 mg/dL (ref 6–23)
CO2: 23 mEq/L (ref 19–32)
Calcium: 9.3 mg/dL (ref 8.4–10.5)
Chloride: 103 mEq/L (ref 96–112)
Creatinine, Ser: 0.59 mg/dL (ref 0.40–1.20)
GFR: 109.44 mL/min (ref >60.00)
Glucose, Bld: 81 mg/dL (ref 70–99)
Potassium: 4 mEq/L (ref 3.5–5.1)
Sodium: 133 mEq/L — ABNORMAL LOW (ref 135–145)
Total Bilirubin: 0.3 mg/dL (ref 0.2–1.2)
Total Protein: 8.3 g/dL (ref 6.0–8.3)

## 2023-01-14 LAB — TSH: TSH: 0 u[IU]/mL — ABNORMAL LOW (ref 0.35–5.50)

## 2023-01-14 LAB — T4, FREE: Free T4: 1.43 ng/dL (ref 0.60–1.60)

## 2023-01-14 NOTE — Patient Instructions (Addendum)
- Use a mild, unscented soap (Dove Sensitive Skin, Aveeno, Vanicream products). - Bathe every other day if possible. Use mildly warm water. Lightly pat dry, do not dry completely or rub skin. - After the bath apply fragrance-free moisturizer (Vaseline, Aveeno Eczema Therapy, Aquaphor, Eucerin, Vanicream, CeraVe cream, Cetaphil Restoraderm, [employer]). These moisturizers can be applied multiple times per day to keep the skin from looking dry.  - Use gentle, fragrance-free laundry detergents  - Use the steroid ointment as indicated for flares. Apply the steroid creams to the skin first, then after a few minutes, top with moisturizer from list above.   Referral for your rashes and mucus in stool [facility] will be calling you to schedule mammogram   -------------------------  Thank you for choosing [facility] at [facility] for your Primary Care needs. I am excited for the opportunity to partner with you to meet your health care goals. It was a pleasure meeting you today!  Information on diet, exercise, and health maintenance recommendations are listed below. This is information to help you be sure you are on track for optimal health and monitoring.   Please look over this and let us know if you have any questions or if you have completed any of the health maintenance outside of [facility] so that we can be sure your records are up to date.  ___________________________________________________________  MyChart:  For all urgent or time sensitive needs we ask that you please call the office to avoid delays. Our number is [phone]. MyChart is not constantly monitored and due to the large volume of messages a day, replies may take up to 72 business hours.  MyChart Policy: MyChart allows for you to see your visit notes, after visit summary, provider recommendations, lab and tests results, make an appointment, request refills, and contact your provider or the  office for non-urgent questions or concerns. Providers are seeing patients during normal business hours and do not have built in time to review MyChart messages.  We ask that you allow a minimum of 3 business days for responses to [employer]. For this reason, please do not send urgent requests through MyChart. Please call the office at [phone]. New and ongoing conditions may require a visit. We have virtual and in-person visits available for your convenience.  Complex MyChart concerns may require a visit. Your provider may request you schedule a virtual or in-person visit to ensure we are providing the best care possible. MyChart messages sent after 11:00 AM on Friday will not be received by the provider until Monday morning.    Lab and Test Results: You will receive your lab and test results on MyChart as soon as they are completed and results have been sent by the lab or testing facility. Due to this service, you will receive your results BEFORE your provider.  I review lab and test results each morning prior to seeing patients. Some results require collaboration with other providers to ensure you are receiving the most appropriate care. For this reason, we ask that you please allow a minimum of 3-5 business days from the time that ALL results have been received for your provider to receive and review lab and test results and contact you about these.  Most lab and test result comments from the provider will be sent through MyChart. Your provider may recommend changes to the plan of care, follow-up visits, repeat testing, ask questions, or request an office visit to discuss these results.  You may reply directly to this message or call the office to provide information for the provider or set up an appointment. In some instances, you will be called with test results and recommendations. Please let us know if this is preferred and we will make note of this in your chart to provide this for you.     If you have not heard a response to your lab or test results in 5 business days from all results returning to MyChart, please call the office to let us know. We ask that you please avoid calling prior to this time unless there is an emergent concern. Due to high call volumes, this can delay the resulting process.  After Hours: For all non-emergency after hours needs, please call the office at [phone] and select the option to reach the on-call  service. On-call services are shared between multiple [facility] offices and therefore it will not be possible to speak directly with your provider. On-call providers may provide medical advice and recommendations, but are unable to provide refills for maintenance medications.  For all emergency or urgent medical needs after normal business hours, we recommend that you seek care at the closest Urgent Care or Emergency Department to ensure appropriate treatment in a timely manner.  [facility] has a 24 hour emergency room located on the ground floor for your convenience.   Urgent Concerns During the Business Day Providers are seeing patients from 8AM to 5PM with a busy schedule and are most often not able to respond to non-urgent calls until the end of the day or the next business day. If you should have URGENT concerns during the day, please call and speak to the nurse or schedule a same day appointment so that we can address your concern without delay.   Thank you, again, for choosing me as your health care partner. I appreciate your trust and look forward to learning more about you!   Lollie Marrow Reola Calkins, DNP, FNP-C  ___________________________________________________________  Health Maintenance Recommendations Screening Testing Mammogram Every 1-2 years based on history and risk factors Starting at age 49 Pap Smear Ages 21-39 every 3 years Ages 48-65 every 5 years with HPV testing More frequent testing may be required based on results  and history Colon Cancer Screening Every 1-10 years based on test performed, risk factors, and history Starting at age 14 Bone Density Screening Every 2-10 years based on history Starting at age 31 for women Recommendations for men differ based on medication usage, history, and risk factors AAA Screening One time ultrasound Men 67-80 years old who have ever smoked Lung Cancer Screening Low Dose Lung CT every 12 months Age 62-80 years with a 20 pack-year smoking history who still smoke or who have quit within the last 15 years  Screening Labs Routine  Labs: Complete Blood Count (CBC), Complete Metabolic Panel (CMP), Cholesterol (Lipid Panel) Every 6-12 months based on history and medications May be recommended more frequently based on current conditions or previous results Hemoglobin A1c Lab Every 3-12 months based on history and previous results Starting at age 43 or earlier with diagnosis of diabetes, high cholesterol, BMI >26, and/or risk factors Frequent monitoring for patients with diabetes to ensure blood sugar control Thyroid Panel  Every 6 months based on history, symptoms, and risk factors May be repeated more often if on medication HIV One time testing for all patients 50 and older May be repeated more frequently for patients with increased risk factors or exposure  Hepatitis C One time testing for all patients 85 and older May be repeated more frequently for patients with increased risk factors or exposure Gonorrhea, Chlamydia Every 12 months for all sexually active persons 13-24 years Additional monitoring may be recommended for those who are considered high risk or who have symptoms PSA Men 59-4 years old with risk factors Additional screening may be recommended from age 80-69 based on risk factors, symptoms, and history  Vaccine Recommendations Tetanus Booster All adults every 10 years Flu Vaccine All patients 6 months and older every year COVID Vaccine All  patients 12 years and older Initial dosing with booster May recommend additional booster based on age and health history HPV Vaccine 2 doses all patients age 38-26 Dosing may be considered for patients over 26 Shingles Vaccine (Shingrix) 2 doses all adults 50 years and older Pneumonia (Pneumovax 49) All adults 65 years and older May recommend earlier dosing based on health history Pneumonia (Prevnar 98) All adults 65 years and older Dosed 1 year after Pneumovax 23 Pneumonia (Prevnar 20) All adults 65 years and older (adults 19-64 with certain conditions or risk factors) 1 dose  For those who have not received Prevnar 13 vaccine previously   Additional Screening, Testing, and Vaccinations may be recommended on an individualized basis based on family history, health history, risk factors, and/or exposure.  __________________________________________________________  Diet Recommendations for All Patients  I recommend that all patients maintain a diet low in saturated fats, carbohydrates, and cholesterol. While this can be challenging at first, it is not impossible and small changes can make big differences.  Things to try: Decreasing the amount of soda, sweet tea, and/or juice to one or less per day and replace with water While water is always the first choice, if you do not like water you may consider adding a water additive without sugar to improve the taste other sugar free drinks Replace potatoes with a brightly colored vegetable  Use healthy oils, such as canola oil or olive oil, instead of butter or hard margarine Limit your bread intake to two pieces or less a day Replace regular pasta with low carb pasta options Bake, broil, or grill foods instead of frying Monitor portion sizes  Eat smaller, more frequent meals throughout the day instead of large meals  An important thing to remember is, if you love foods that are not great for your health, you don't have to give them up  completely. Instead, allow these foods to be a reward when you have done well. Allowing yourself to still have special treats every once in a while is a nice way to tell yourself thank you for working hard to keep yourself healthy.   Also remember that every day is a new day. If you have a bad day and "fall off the wagon", you can still climb right back up and keep moving along on your journey!  We have resources available to help you!  Some websites that may be helpful include: www.http://www.wall-moore.info/  Www.VeryWellFit.com _____________________________________________________________  Activity Recommendations for All Patients  I recommend that all adults get at least 20 minutes of moderate physical activity that elevates your heart rate at least 5 days out of the week.  Some examples include: Walking or jogging at a pace that allows you to carry on a conversation Cycling (stationary bike or outdoors) Water aerobics Yoga Weight lifting Dancing If physical limitations prevent you from putting stress on your joints, exercise in a pool or seated in a chair  are excellent options.  Do determine your MAXIMUM heart rate for activity: 220 - YOUR AGE = MAX Heart Rate   Remember! Do not push yourself too hard.  Start slowly and build up your pace, speed, weight, time in exercise, etc.  Allow your body to rest between exercise and get good sleep. You will need more water than normal when you are exerting yourself. Do not wait until you are thirsty to drink. Drink with a purpose of getting in at least 8, 8 ounce glasses of water a day plus more depending on how much you exercise and sweat.    If you begin to develop dizziness, chest pain, abdominal pain, jaw pain, shortness of breath, headache, vision changes, lightheadedness, or other concerning symptoms, stop the activity and allow your body to rest. If your symptoms are severe, seek emergency evaluation immediately. If your symptoms are concerning, but  not severe, please let us know so that we can recommend further evaluation.

## 2023-01-14 NOTE — Addendum Note (Signed)
Addended by: Hyman Hopes B on: 01/14/2023 03:53 PM   Modules accepted: Orders

## 2023-01-14 NOTE — Progress Notes (Signed)
New Patient Office Visit  Subjective    Patient ID: Briana [name], female    DOB: [birth year]  Age: 47 y.o. MRN: [id number]  CC:  Chief Complaint  Patient presents with   Establish Care    HPI Briana [name] presents to establish care. She has not had primary care in awhile.  She is here today with her mom. She works in Office manager at [employer].  Breast lump: - last week she noticed a firm lump to left upper breast. Mildly uncomfortable, but no true pain or nipple changes. Skin is dry/scaly on breast, but was noticed before she felt the lump.   HIV: - She was diagnosed in 1996 and follows with Dr. Luciana Axe at [facility].  - She is doing well on Biktarvy.   Rash: - Scaly rash to bilateral hands and occasionally on her face, abdomen, left breast. She has been treating like eczema with triamcinolone without much improvement yet. She would like to see dermatology.   GI: - earlier this week she started to notice mucus in her stools. No diarrhea, constipation, diet changes, abdominal pian, nausea. No other symptoms.    Outpatient Encounter Medications as of 01/14/2023  Medication Sig   bictegravir-emtricitabine-tenofovir AF (BIKTARVY) 50-200-25 MG TABS tablet TAKE 1 TABLET DAILY   [DISCONTINUED] triamcinolone (KENALOG) 0.025 % ointment Apply 1 Application topically 2 (two) times daily. As needed   No facility-administered encounter medications on file as of 01/14/2023.    Past Medical History:  Diagnosis Date   Abnormal Pap smear    s/p colposcopy    Anemia    Chronic kidney disease 03/2013   kidney infection   HIV infection (HCC)    Screening for malignant neoplasm of the cervix     Past Surgical History:  Procedure Laterality Date   CESAREAN SECTION     2000/2008   TUBAL LIGATION      Family History  Problem Relation Age of Onset   Eczema Mother    Diabetes Father    Hypertension Father    Diabetes Maternal Grandmother    Hypertension Maternal Grandmother    Diabetes  Maternal Grandfather    Hypertension Maternal Grandfather    Diabetes Paternal Grandmother    Hypertension Paternal Grandmother    Diabetes Paternal Grandfather    Hypertension Paternal Grandfather     Social History   Socioeconomic History   Marital status: Single    Spouse name: Not on file   Number of children: Not on file   Years of education: Not on file   Highest education level: Not on file  Occupational History   Not on file  Tobacco Use   Smoking status: Never   Smokeless tobacco: Never  Vaping Use   Vaping Use: Never used  Substance and Sexual Activity   Alcohol use: No   Drug use: No   Sexual activity: Not Currently    Partners: Male  Other Topics Concern   Not on file  Social History Narrative   Not on file   Social Determinants of Health   Financial Resource Strain: Not on file  Food Insecurity: Not on file  Transportation Needs: Not on file  Physical Activity: Not on file  Stress: Not on file  Social Connections: Not on file  Intimate Partner Violence: Not on file    ROS All review of systems negative except what is listed in the HPI      Objective    BP (!) 166/92   Pulse Marland Kitchen)  104   Ht 5\' 5"  (1.651 m)   Wt 182 lb (82.6 kg)   SpO2 100%   BMI 30.29 kg/m   Physical Exam Vitals reviewed.  Constitutional:      Appearance: Normal appearance.  Cardiovascular:     Rate and Rhythm: Normal rate and regular rhythm.     Pulses: Normal pulses.     Heart sounds: Normal heart sounds.  Pulmonary:     Effort: Pulmonary effort is normal.     Breath sounds: Normal breath sounds.  Chest:    Skin:    General: Skin is warm and dry.     Comments: Dry/scaly rash to hands, forearms, abdomen, left breast   Neurological:     Mental Status: She is alert and oriented to person, place, and time.  Psychiatric:        Mood and Affect: Mood normal.        Behavior: Behavior normal.        Thought Content: Thought content normal.        Judgment:  Judgment normal.          Assessment & Plan:   Problem List Items Addressed This Visit   None Visit Diagnoses     Encounter for medical examination to establish care    -  Primary    Mucus in stool     Encouraged to monitor stool and keep food diary.  Referral to GI if persistent   Relevant Orders   Ambulatory referral to Gastroenterology   CBC with Differential/Platelet   Rash     Eczema education provided Referral to dermatology    Relevant Orders   Ambulatory referral to Dermatology   Mass of upper outer quadrant of left breast        Relevant Orders   MM Digital Diagnostic Bilat   CBC with Differential/Platelet   Elevated blood pressure reading     Blood pressure is not at goal for age and co-morbidities.   Recommendations: monitor for 2 weeks and follow-up here - BP goal <130/80 - monitor and log blood pressures at home - check around the same time each day in a relaxed setting - Limit salt to <2000 mg/day - Follow DASH eating plan (heart healthy diet) - limit alcohol to 2 standard drinks per day for men and 1 per day for women - avoid tobacco products - get at least 2 hours of regular aerobic exercise weekly Patient aware of signs/symptoms requiring further/urgent evaluation. Labs updated today.    Relevant Orders   CBC with Differential/Platelet   Comprehensive metabolic panel   TSH       Return for schedule physical with labs/pap; 2 week blood pressure follow-up with me.   Clayborne Dana, NP

## 2023-01-15 ENCOUNTER — Other Ambulatory Visit: Payer: No Typology Code available for payment source

## 2023-01-15 DIAGNOSIS — R946 Abnormal results of thyroid function studies: Secondary | ICD-10-CM

## 2023-01-16 ENCOUNTER — Other Ambulatory Visit: Payer: Self-pay | Admitting: Family Medicine

## 2023-01-16 ENCOUNTER — Other Ambulatory Visit: Payer: Self-pay | Admitting: Internal Medicine

## 2023-01-16 DIAGNOSIS — N6321 Unspecified lump in the left breast, upper outer quadrant: Secondary | ICD-10-CM

## 2023-01-16 DIAGNOSIS — B20 Human immunodeficiency virus [HIV] disease: Secondary | ICD-10-CM

## 2023-01-16 LAB — THYROID ANTIBODIES
Thyroglobulin Ab: <1 IU/mL (ref <=1)
Thyroperoxidase Ab SerPl-aCnc: <1 IU/mL (ref <9)

## 2023-01-16 NOTE — Telephone Encounter (Signed)
Please advise on refill request

## 2023-01-19 ENCOUNTER — Other Ambulatory Visit: Payer: Self-pay | Admitting: Family Medicine

## 2023-01-19 ENCOUNTER — Ambulatory Visit
Admission: RE | Admit: 2023-01-19 | Discharge: 2023-01-19 | Disposition: A | Discharge status: Home / Self Care | Payer: No Typology Code available for payment source | Source: Ambulatory Visit | Attending: Family Medicine | Admitting: Family Medicine

## 2023-01-19 DIAGNOSIS — N6321 Unspecified lump in the left breast, upper outer quadrant: Secondary | ICD-10-CM

## 2023-01-19 DIAGNOSIS — R599 Enlarged lymph nodes, unspecified: Secondary | ICD-10-CM

## 2023-01-20 ENCOUNTER — Ambulatory Visit
Admission: RE | Admit: 2023-01-20 | Discharge: 2023-01-20 | Discharge status: Home / Self Care | Payer: No Typology Code available for payment source | Source: Ambulatory Visit | Attending: Family Medicine | Admitting: Family Medicine

## 2023-01-20 ENCOUNTER — Ambulatory Visit
Admission: RE | Admit: 2023-01-20 | Discharge: 2023-01-20 | Disposition: A | Discharge status: Home / Self Care | Payer: No Typology Code available for payment source | Source: Ambulatory Visit | Attending: Family Medicine | Admitting: Family Medicine

## 2023-01-20 ENCOUNTER — Ambulatory Visit (HOSPITAL_BASED_OUTPATIENT_CLINIC_OR_DEPARTMENT_OTHER)
Admission: RE | Admit: 2023-01-20 | Discharge: 2023-01-20 | Disposition: A | Discharge status: Home / Self Care | Payer: No Typology Code available for payment source | Source: Ambulatory Visit | Attending: Family Medicine | Admitting: Family Medicine

## 2023-01-20 DIAGNOSIS — E0590 Thyrotoxicosis, unspecified without thyrotoxic crisis or storm: Secondary | ICD-10-CM | POA: Diagnosis present

## 2023-01-20 DIAGNOSIS — N6321 Unspecified lump in the left breast, upper outer quadrant: Secondary | ICD-10-CM

## 2023-01-20 DIAGNOSIS — R599 Enlarged lymph nodes, unspecified: Secondary | ICD-10-CM

## 2023-01-20 HISTORY — PX: BREAST BIOPSY: SHX20

## 2023-01-21 ENCOUNTER — Other Ambulatory Visit: Payer: Self-pay | Admitting: Family Medicine

## 2023-01-21 DIAGNOSIS — C779 Secondary and unspecified malignant neoplasm of lymph node, unspecified: Secondary | ICD-10-CM

## 2023-01-21 DIAGNOSIS — C50919 Malignant neoplasm of unspecified site of unspecified female breast: Secondary | ICD-10-CM

## 2023-01-23 ENCOUNTER — Encounter: Payer: Self-pay | Admitting: *Deleted

## 2023-01-23 ENCOUNTER — Encounter: Payer: Self-pay | Admitting: Hematology & Oncology

## 2023-01-23 ENCOUNTER — Inpatient Hospital Stay: Payer: No Typology Code available for payment source | Attending: Hematology & Oncology

## 2023-01-23 ENCOUNTER — Other Ambulatory Visit: Payer: No Typology Code available for payment source

## 2023-01-23 ENCOUNTER — Other Ambulatory Visit: Payer: Self-pay

## 2023-01-23 ENCOUNTER — Inpatient Hospital Stay: Payer: No Typology Code available for payment source | Admitting: Hematology & Oncology

## 2023-01-23 VITALS — BP 160/94 | HR 88 | Temp 98.1°F | Resp 18 | Ht 65.0 in | Wt 187.0 lb

## 2023-01-23 DIAGNOSIS — Z21 Asymptomatic human immunodeficiency virus [HIV] infection status: Secondary | ICD-10-CM | POA: Insufficient documentation

## 2023-01-23 DIAGNOSIS — Z7952 Long term (current) use of systemic steroids: Secondary | ICD-10-CM | POA: Insufficient documentation

## 2023-01-23 DIAGNOSIS — Z79899 Other long term (current) drug therapy: Secondary | ICD-10-CM | POA: Diagnosis not present

## 2023-01-23 DIAGNOSIS — Z171 Estrogen receptor negative status [ER-]: Secondary | ICD-10-CM | POA: Insufficient documentation

## 2023-01-23 DIAGNOSIS — C50011 Malignant neoplasm of nipple and areola, right female breast: Secondary | ICD-10-CM | POA: Diagnosis not present

## 2023-01-23 DIAGNOSIS — C50912 Malignant neoplasm of unspecified site of left female breast: Secondary | ICD-10-CM | POA: Insufficient documentation

## 2023-01-23 DIAGNOSIS — L309 Dermatitis, unspecified: Secondary | ICD-10-CM | POA: Insufficient documentation

## 2023-01-23 DIAGNOSIS — B20 Human immunodeficiency virus [HIV] disease: Secondary | ICD-10-CM

## 2023-01-23 LAB — CBC WITH DIFFERENTIAL (CANCER CENTER ONLY)
Abs Immature Granulocytes: 0.16 10*3/uL — ABNORMAL HIGH (ref 0.00–0.07)
Basophils Absolute: 0 10*3/uL (ref 0.0–0.1)
Basophils Relative: 0 %
Eosinophils Absolute: 0 10*3/uL (ref 0.0–0.5)
Eosinophils Relative: 0 %
HCT: 37.7 % (ref 36.0–46.0)
Hemoglobin: 11.2 g/dL — ABNORMAL LOW (ref 12.0–15.0)
Immature Granulocytes: 1 %
Lymphocytes Relative: 18 %
Lymphs Abs: 3.3 10*3/uL (ref 0.7–4.0)
MCH: 21.7 pg — ABNORMAL LOW (ref 26.0–34.0)
MCHC: 29.7 g/dL — ABNORMAL LOW (ref 30.0–36.0)
MCV: 73.2 fL — ABNORMAL LOW (ref 80.0–100.0)
Monocytes Absolute: 0.3 10*3/uL (ref 0.1–1.0)
Monocytes Relative: 2 %
Neutro Abs: 14.6 10*3/uL — ABNORMAL HIGH (ref 1.7–7.7)
Neutrophils Relative %: 79 %
Platelet Count: 337 10*3/uL (ref 150–400)
RBC: 5.15 MIL/uL — ABNORMAL HIGH (ref 3.87–5.11)
RDW: 14.6 % (ref 11.5–15.5)
WBC Count: 18.4 10*3/uL — ABNORMAL HIGH (ref 4.0–10.5)
nRBC: 0 % (ref 0.0–0.2)

## 2023-01-23 LAB — CMP (CANCER CENTER ONLY)
ALT: 12 U/L (ref 0–44)
AST: 11 U/L — ABNORMAL LOW (ref 15–41)
Albumin: 3.8 g/dL (ref 3.5–5.0)
Alkaline Phosphatase: 143 U/L — ABNORMAL HIGH (ref 38–126)
Anion gap: 9 (ref 5–15)
BUN: 15 mg/dL (ref 6–20)
CO2: 22 mmol/L (ref 22–32)
Calcium: 9.3 mg/dL (ref 8.9–10.3)
Chloride: 104 mmol/L (ref 98–111)
Creatinine: 0.72 mg/dL (ref 0.44–1.00)
GFR, Estimated: >60 mL/min (ref >60)
Glucose, Bld: 160 mg/dL — ABNORMAL HIGH (ref 70–99)
Potassium: 3.8 mmol/L (ref 3.5–5.1)
Sodium: 135 mmol/L (ref 135–145)
Total Bilirubin: 0.2 mg/dL — ABNORMAL LOW (ref 0.3–1.2)
Total Protein: 7.7 g/dL (ref 6.5–8.1)

## 2023-01-23 LAB — LACTATE DEHYDROGENASE: LDH: 164 U/L (ref 98–192)

## 2023-01-23 NOTE — Progress Notes (Signed)
Initial RN Navigator Patient Visit  Name: Briana [name] Date of Referral : 01/15/2023 Diagnosis: Breast Cancer *patient was initially referred to our office for IDA however during the wait for her new patient appointment, she received breast cancer diagnosis.   Met with patient prior to their visit with MD. [name] patient "Your Patient Navigator" handout which explains my role, areas in which I am able to help, and all the contact information for myself and the office. Also gave patient MD and Navigator business card. Reviewed with patient the general overview of expected course after initial diagnosis and time frame for all steps to be completed.  New patient packet given to patient which includes: orientation to office and staff; campus directory; education on My Chart and Advance Directives; and patient centered education on breast cancer.  She comes to the appointment alone. She has four kids, 3 of whom are grown and out of the house. A 16 daughter lives with her. She reports she has a good support system. 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   She is already scheduled next week for breast MRI and consultation with surgeon.   Referral to nutrition and social work placed per protocol.   Patient completed visit with Dr. Myna Hidalgo. We need her HER2 status before starting therapy. While waiting she will need PET, port and echo while waiting for final path.   Port scheduled for 02/06/2023  PET scheduled for 6/4 at [facility] location. Spoke to patient and she is okay with driving further for earlier appointment time.   Patient is aware of PET appointment including date, time, and location. The following prep is reviewed with patient and confirmed with teachback: - arrive 30 minutes before appointment time - NPO except water for 6h before scan. No candy, no gum - hold any diabetic medication the morning of the scan - have a low carb dinner the night  prior Radiology Information sheet also mailed to patient's home for reinforcement of education.  Patient understands all follow up procedures and expectations. They have my number to reach out for any further clarification or additional needs.   Oncology Nurse Navigator Documentation     01/23/2023   11:30 AM  Oncology Nurse Navigator Flowsheets  Abnormal Finding Date 01/19/2023  Confirmed Diagnosis Date 01/20/2023  Navigator Follow Up Date: 01/27/2023  Navigator Follow Up Reason: Pathology  Navigator Location [facility]  Referral Date to RadOnc/MedOnc 01/15/2023  Navigator Encounter Type Initial MedOnc  Patient Visit Type MedOnc  Treatment Phase Pre-Tx/Tx Discussion  Barriers/Navigation Needs Coordination of Care;Education  Education Newly Diagnosed Cancer Education;Pain/ Symptom Management;Other  Interventions Alight Guide;Coordination of Care;Education;Psycho-Social Support;Referrals  Acuity Level 2-Minimal Needs (1-2 Barriers Identified)  Referrals Nutrition/dietician;Social Work  Nature conservation officer Groups/Services Friends and Family  Time Spent with Patient 45

## 2023-01-23 NOTE — Progress Notes (Signed)
Referral MD  Reason for Referral: Locally advanced infiltrating ductal carcinoma of the left breast  Chief Complaint  Patient presents with   New Patient (Initial Visit)  : I have breast cancer.  HPI: Briana [name] is a very charming 47 year old premenopausal Afro-American female.  She works at [employer].  She works in Office manager.  She has been very healthy.  She does have history of HIV.  She got this back in 1996.  So far, this is nondetectable.  She is on Biktarvy for this  She was never had a mammogram before.  There is no history of breast cancer in her family.  She knows that there is a lump in the left breast.  She was notices while she was putting some lotion.  She does have a history of eczema.  She currently is on some prednisone for this.  She had a mammogram that was done on 01/14/2023.  This showed a abnormality measuring 6.4 x 5.4 cm.  Apparently, the actual mass and calcifications measured 12 cm.  There was a abnormal lymph node in the left axilla.  It is felt that there may have been at least 4 lymph nodes.  She then underwent a biopsy.  This was done on 01/20/2023.  The pathology report [id number]) showed an invasive ductal carcinoma.  The tumor was high-grade.  There was a positive lymph node that was biopsied.  There was lymphovascular space invasion.  Unfortunately, the tumor was ER negative/PR negative.  It was HER2 equivocal.  We are awaiting the final HER2 study.  She has had no weight loss or weight gain.  There has been no change in bowel or bladder habits.  She has had no bony pain.  She has had no leg swelling.  She has had no headache.  There is been no cough or shortness of breath.  She does not smoke.  She has very rare alcohol use.  She had a first child when she was 14 years old.  Currently, I would say that her performance status is probably ECOG 0.    Past Medical History:  Diagnosis Date   Abnormal Pap smear    s/p colposcopy    Anemia     Chronic kidney disease 03/2013   kidney infection   HIV infection (HCC)    Screening for malignant neoplasm of the cervix   :   Past Surgical History:  Procedure Laterality Date   BREAST BIOPSY Left 01/20/2023   [place] LT BREAST BX W LOC DEV 1ST LESION IMG BX SPEC US GUIDE 01/20/2023 [facility] MAMMOGRAPHY   CESAREAN SECTION     2000/2008   TUBAL LIGATION    :   Current Outpatient Medications:    bictegravir-emtricitabine-tenofovir AF (BIKTARVY) 50-200-25 MG TABS tablet, TAKE 1 TABLET DAILY, Disp: 30 tablet, Rfl: 11   predniSONE (DELTASONE) 20 MG tablet, Take 20 mg by mouth daily with breakfast., Disp: , Rfl:    triamcinolone (KENALOG) 0.025 % ointment, APPLY TOPICALLY TWICE DAILY AS NEEDED, Disp: 454 g, Rfl: 1:  :   Allergies  Allergen Reactions   Pumpkin Flavor Swelling    ONLY Lip swelling after eating pumpkin pie  :   Family History  Problem Relation Age of Onset   Eczema Mother    Diabetes Father    Hypertension Father    Diabetes Maternal Grandmother    Hypertension Maternal Grandmother    Diabetes Maternal Grandfather    Hypertension Maternal Grandfather    Diabetes Paternal Grandmother  Hypertension Paternal Grandmother    Diabetes Paternal Grandfather    Hypertension Paternal Grandfather   :   Social History   Socioeconomic History   Marital status: Single    Spouse name: Not on file   Number of children: Not on file   Years of education: Not on file   Highest education level: Not on file  Occupational History   Not on file  Tobacco Use   Smoking status: Never   Smokeless tobacco: Never  Vaping Use   Vaping Use: Never used  Substance and Sexual Activity   Alcohol use: No   Drug use: No   Sexual activity: Not Currently    Partners: Male  Other Topics Concern   Not on file  Social History Narrative   Not on file   Social Determinants of Health   Financial Resource Strain: Not on file  Food Insecurity: No Food Insecurity (01/23/2023)    Hunger Vital Sign    Worried About Running Out of Food in the Last Year: Never true    Ran Out of Food in the Last Year: Never true  Transportation Needs: No Transportation Needs (01/23/2023)   PRAPARE - Administrator, Civil Service (Medical): No    Lack of Transportation (Non-Medical): No  Physical Activity: Not on file  Stress: Not on file  Social Connections: Not on file  Intimate Partner Violence: Not At Risk (01/23/2023)   Humiliation, Afraid, Rape, and Kick questionnaire    Fear of Current or Ex-Partner: No    Emotionally Abused: No    Physically Abused: No    Sexually Abused: No  :  Review of Systems  Constitutional: Negative.   HENT: Negative.    Eyes: Negative.   Respiratory: Negative.    Cardiovascular: Negative.   Gastrointestinal: Negative.   Genitourinary: Negative.   Musculoskeletal: Negative.   Skin: Negative.   Neurological: Negative.   Endo/Heme/Allergies: Negative.   Psychiatric/Behavioral: Negative.       Exam: Her vital signs are temperature of 98.1.  Pulse 88.  Blood pressure 160/94.  Weight is 187 pounds.  @IPVITALS @ Physical Exam Vitals reviewed.  Constitutional:      Comments: Her breast exam shows right breast with no masses, edema or erythema.  There is no right axillary adenopathy.  Her left breast does show a mass in in the 2 o'clock position.  She had this probably measures about 6 cm.  It is firm.  It is nontender.  It is not mobile.  There is no nipple discharge.  There is no breast swelling or erythema.  There is no obvious left axillary adenopathy.  HENT:     Head: Normocephalic and atraumatic.  Eyes:     Pupils: Pupils are equal, round, and reactive to light.  Cardiovascular:     Rate and Rhythm: Normal rate and regular rhythm.     Heart sounds: Normal heart sounds.  Pulmonary:     Effort: Pulmonary effort is normal.     Breath sounds: Normal breath sounds.  Abdominal:     General: Bowel sounds are normal.      Palpations: Abdomen is soft.  Musculoskeletal:        General: No tenderness or deformity. Normal range of motion.     Cervical back: Normal range of motion.  Lymphadenopathy:     Cervical: No cervical adenopathy.  Skin:    General: Skin is warm and dry.     Findings: No erythema or rash.  Neurological:  Mental Status: She is alert and oriented to person, place, and time.  Psychiatric:        Behavior: Behavior normal.        Thought Content: Thought content normal.        Judgment: Judgment normal.     Recent Labs    01/23/23 1112  WBC 18.4*  HGB 11.2*  HCT 37.7  PLT 337    Recent Labs    01/23/23 1112  NA 135  K 3.8  CL 104  CO2 22  GLUCOSE 160*  BUN 15  CREATININE 0.72  CALCIUM 9.3    Blood smear review: None  Pathology: See above    Assessment and Plan: Briana [name] is a very charming 47 year old African-American female.  She is premenopausal.  She has what clearly is locally advanced breast cancer.  This probably is at least stage IIIA or IIIB.  We really are going to need to do a PET scan on her to see if she has metastatic disease.  She is set up for the MRI of the left breast next week.  She clearly will need systemic chemotherapy whether or not she is going to have surgery.  But we do not have yet back is the HER2 status.  This really will dictate what our protocol will be for systemic therapy.  I will go ahead and get her set up with an echocardiogram.  I will also get her set up with a Port-A-Cath.  She is in great shape.  We can certainly be aggressive with her.  Hopefully, this is some that is not known to be metastatic.  I do worry that she has a very high proliferation index of 90%.  I had a very nice talk with her.  She is most charming.  It was certainly nice to see her and talk with her.  She has a strong faith.  I gave her a prayer blanket which she was very grateful for.  I think she sees Surgical Oncology next week.  I am  sure that they would want her to have neoadjuvant chemotherapy.  We must keep in mind that she does have history of HIV.  Apparently, this is under very good control.  As such, I do not think this should be a factor in how we have to treat her.

## 2023-01-27 ENCOUNTER — Telehealth: Payer: Self-pay

## 2023-01-27 NOTE — Telephone Encounter (Signed)
CSW attempted to contact patient per Nurse Navigator referral.  Left vm.

## 2023-01-28 ENCOUNTER — Ambulatory Visit: Payer: No Typology Code available for payment source

## 2023-01-28 ENCOUNTER — Encounter: Payer: Self-pay | Admitting: *Deleted

## 2023-01-28 ENCOUNTER — Telehealth: Payer: Self-pay

## 2023-01-28 DIAGNOSIS — I10 Essential (primary) hypertension: Secondary | ICD-10-CM | POA: Diagnosis not present

## 2023-01-28 MED ORDER — AMLODIPINE BESYLATE 5 MG PO TABS: 5.0000 mg | ORAL_TABLET | Freq: Every day | ORAL | 2 refills | Status: DC

## 2023-01-28 NOTE — Progress Notes (Signed)
Final path showing triple negative breast cancer reviewed with Dr Myna Hidalgo. Patient has the following staging scheduled. Will follow up in this office once completed.   CCS consultation 5/29 MRI 5/31 PET 6/4 Port placement 6/7 Echo 6/11  Oncology Nurse Navigator Documentation     01/28/2023   12:45 PM  Oncology Nurse Navigator Flowsheets  Navigator Follow Up Date: 02/03/2023  Navigator Follow Up Reason: Scan Review  Navigator Location [facility]  Navigator Encounter Type Appt/Treatment Plan Review  Patient Visit Type MedOnc  Treatment Phase Pre-Tx/Tx Discussion  Barriers/Navigation Needs Coordination of Care;Education  Interventions None Required  Acuity Level 2-Minimal Needs (1-2 Barriers Identified)  Support Groups/Services Friends and Family  Time Spent with Patient 15

## 2023-01-28 NOTE — Progress Notes (Signed)
Pt here for Blood pressure check per Ladona Ridgel  Pt currently takes: N/A   Pt reports compliance with medication.  BP today @ = 180/100 HR = 84   BP recheck-- 160/110   Pt advised per Dr. Carmelia Roller to start Amlodipine 5 mg daily. Follow up with Ladona Ridgel in 2-3 weeks. Continue to check blood pressures at home. Appt made.

## 2023-01-28 NOTE — Telephone Encounter (Signed)
Patient returned CSW call.  Attempted to contact patient again and left a vm.

## 2023-01-29 ENCOUNTER — Ambulatory Visit: Payer: No Typology Code available for payment source | Admitting: Dietician

## 2023-01-29 NOTE — Progress Notes (Signed)
Nutrition Assessment: Reached out to patient at home telephone number.    Reason for Assessment: New Patient Assessment   ASSESSMENT: Patient is 47 year old female with Left breast cancer. 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  She has a PMHx that includes IDA, HTN, and HIV.  She is meeting with surgical oncology and being followed by Dr. Myna Hidalgo with plan for neoadjuvant chemo.  She relayed she has a food allergy to pumpkin pie spice. Usual intake includes:  Grazer with One meal per day pattern usually from take out or drive through.  Snacking: chips, Lance or [employer],   Now choosing some nuts, fruits,  Main meal each day: trying to choose healthier options  [employer], spicy chicken, rice [employer] Filet of fish with fries, regular coke Fluids: water, flavor packets, trying not to drink sodas, not much alcohol, was using almond milk with cereal. Taking oral but no other vitamins or supplements.   Nutrition Focused Physical Exam: unable to perform NFPE   Medications: prednisone   Labs: 01/23/23  Glucose 160, Hgb 11.2   Anthropometrics: weight increasing this month  Height: 65" Weight: 01/23/23  187# 01/14/23  182# 01/08/23  183#  BMI:  31.12   Estimated Energy Needs  Kcals: 2100-2500 Protein: 102-128 g Fluid: 2.5 L   NUTRITION DIAGNOSIS: none at this time, interventions just healthy eating guidelines.   INTERVENTION:   Relayed that nutrition services are wrap around service provided at no charge and encouraged continued communication if experiencing any nutritional impact symptoms (NIS). Educated on importance of adequate nourishment with calorie and protein energy intake with nutrient dense foods when possible to maintain weight/strength and QOL.   Encourage her to have PCP check vit D levels.  Relayed food groups that she should increased (fruit, veg, dairy or dairy sub) to improved nutrient intake for her and  77 year old daughter. Emailed Nutrition Tip sheet  for   High Protein Snacking, Soft moist protein foods Contact information provided.  MONITORING, EVALUATION, GOAL: weight trends, nutrition impact symptoms, PO intake, labs   Next Visit: PRN at patient or provider request.  Gennaro Africa, RDN, LDN Registered Dietitian, [facility] Part Time Remote (Usual office hours: Tuesday-Thursday) Mobile: [phone]

## 2023-01-30 ENCOUNTER — Encounter: Payer: Self-pay | Admitting: Physician Assistant

## 2023-01-30 ENCOUNTER — Ambulatory Visit: Payer: No Typology Code available for payment source | Admitting: Physician Assistant

## 2023-01-30 ENCOUNTER — Ambulatory Visit
Admission: RE | Admit: 2023-01-30 | Discharge: 2023-01-30 | Disposition: A | Discharge status: Home / Self Care | Payer: No Typology Code available for payment source | Source: Ambulatory Visit | Attending: Family Medicine

## 2023-01-30 VITALS — BP 140/88 | HR 98 | Ht 65.0 in | Wt 188.0 lb

## 2023-01-30 DIAGNOSIS — K5903 Drug induced constipation: Secondary | ICD-10-CM

## 2023-01-30 DIAGNOSIS — R195 Other fecal abnormalities: Secondary | ICD-10-CM

## 2023-01-30 DIAGNOSIS — C50919 Malignant neoplasm of unspecified site of unspecified female breast: Secondary | ICD-10-CM

## 2023-01-30 MED ORDER — GADOPICLENOL 0.5 MMOL/ML IV SOLN
10.0000 mL | Freq: Once | INTRAVENOUS | Status: AC | PRN
  Administered 2023-01-30: 10 mL via INTRAVENOUS

## 2023-01-30 NOTE — Patient Instructions (Signed)
Start Miralax 1 capful daily in 8 ounces of liquid.  Start Benefiber 2 teaspoons in 8 ounces of liquid daily.  _______________________________________________________  If your blood pressure at your visit was 140/90 or greater, please contact your primary care physician to follow up on this.  _______________________________________________________  If you are age 45 or older, your body mass index should be between 23-30. Your Body mass index is 31.28 kg/m. If this is out of the aforementioned range listed, please consider follow up with your Primary Care Provider.  If you are age 109 or younger, your body mass index should be between 19-25. Your Body mass index is 31.28 kg/m. If this is out of the aformentioned range listed, please consider follow up with your Primary Care Provider.   ________________________________________________________  The [facility] providers would like to encourage you to use [facility] to communicate with providers for non-urgent requests or questions.  Due to long hold times on the telephone, sending your provider a message by [facility] may be a faster and more efficient way to get a response.  Please allow 48 business hours for a response.  Please remember that this is for non-urgent requests.  _______________________________________________________

## 2023-01-30 NOTE — Progress Notes (Signed)
Chief Complaint: Mucous in stool  HPI:    Briana [name] is a  47 y/o AA female with a past medical history as listed below including HIV and new diagnosis of breast cancer, who was referred to me by Clayborne Dana, NP for a complaint of mucus in stools.      01/14/2023 patient seen by PCP and at that time discussed a mass in her breast as well as a history of HIV and doing well on Biktarvy as well as describing some mucus in her stools.    Today, patient tells me that she is currently being worked up for breast cancer.  She is scheduled for a PET scan on 02/03/2023.  Describes to me that she was sent here because she saw some mucus in her stools.  She discovered this that week that she went to see her primary care provider as above and tells me that she was seeing mucus with her stools as well as sometimes when she would just wipe her bottom.  Tells me that she has been a little more constipated as a started her on oral iron which she is taking daily.  Denies any other GI complaints or concerns.  She has not seen any further mucus over the past 2 days.    Denies fever, chills, weight loss, nausea, vomiting or symptoms that awaken her from sleep.  Past Medical History:  Diagnosis Date   Abnormal Pap smear    s/p colposcopy    Anemia    Breast cancer (HCC) 12/2022   Chronic kidney disease 03/01/2013   kidney infection   HIV infection (HCC)    Screening for malignant neoplasm of the cervix     Past Surgical History:  Procedure Laterality Date   BREAST BIOPSY Left 01/20/2023   [place] LT BREAST BX W LOC DEV 1ST LESION IMG BX SPEC US GUIDE 01/20/2023 [facility] MAMMOGRAPHY   CESAREAN SECTION     2000/2008   TUBAL LIGATION      Current Outpatient Medications  Medication Sig Dispense Refill   amLODipine (NORVASC) 5 MG tablet Take 1 tablet (5 mg total) by mouth daily. 30 tablet 2   bictegravir-emtricitabine-tenofovir AF (BIKTARVY) 50-200-25 MG TABS tablet TAKE 1 TABLET DAILY 30 tablet 11   predniSONE  (DELTASONE) 20 MG tablet Take 20 mg by mouth daily with breakfast.     triamcinolone (KENALOG) 0.025 % ointment APPLY TOPICALLY TWICE DAILY AS NEEDED 454 g 1   No current facility-administered medications for this visit.    Allergies as of 01/30/2023 - Review Complete 01/30/2023  Allergen Reaction Noted   Pumpkin flavor Swelling 11/28/2016    Family History  Problem Relation Age of Onset   Eczema Mother    Diabetes Father    Hypertension Father    Diabetes Maternal Grandmother    Hypertension Maternal Grandmother    Diabetes Maternal Grandfather    Hypertension Maternal Grandfather    Diabetes Paternal Grandmother    Hypertension Paternal Grandmother    Diabetes Paternal Grandfather    Hypertension Paternal Grandfather     Social History   Socioeconomic History   Marital status: Single    Spouse name: Not on file   Number of children: Not on file   Years of education: Not on file   Highest education level: Not on file  Occupational History   Not on file  Tobacco Use   Smoking status: Never   Smokeless tobacco: Never  Vaping Use   Vaping Use:  Never used  Substance and Sexual Activity   Alcohol use: No   Drug use: No   Sexual activity: Not Currently    Partners: Male  Other Topics Concern   Not on file  Social History Narrative   Not on file   Social Determinants of Health   Financial Resource Strain: Not on file  Food Insecurity: No Food Insecurity (01/23/2023)   Hunger Vital Sign    Worried About Running Out of Food in the Last Year: Never true    Ran Out of Food in the Last Year: Never true  Transportation Needs: No Transportation Needs (01/23/2023)   PRAPARE - Administrator, Civil Service (Medical): No    Lack of Transportation (Non-Medical): No  Physical Activity: Not on file  Stress: Not on file  Social Connections: Not on file  Intimate Partner Violence: Not At Risk (01/23/2023)   Humiliation, Afraid, Rape, and Kick questionnaire     Fear of Current or Ex-Partner: No    Emotionally Abused: No    Physically Abused: No    Sexually Abused: No    Review of Systems:    Constitutional: No weight loss, fever or chills Skin: No new lesions Cardiovascular: No chest pain Respiratory: No SOB  Gastrointestinal: See HPI and otherwise negative Genitourinary: No dysuria Neurological: No headache, dizziness or syncope Musculoskeletal: No new muscle or joint pain Hematologic: No bleeding  Psychiatric: No history of depression or anxiety   Physical Exam:  Vital signs: BP (!) 140/88   Pulse 98   Ht 5\' 5"  (1.651 m)   Wt 188 lb (85.3 kg)   LMP 01/02/2023   BMI 31.28 kg/m    Constitutional:   Pleasant AA female appears to be in NAD, Well developed, Well nourished, alert and cooperative Head:  Normocephalic and atraumatic. Eyes:   PEERL, EOMI. No icterus. Conjunctiva pink. Ears:  Normal auditory acuity. Neck:  Supple Throat: Oral cavity and pharynx without inflammation, swelling or lesion.  Respiratory: Respirations even and unlabored. Lungs clear to auscultation bilaterally.   No wheezes, crackles, or rhonchi.  Cardiovascular: Normal S1, S2. No MRG. Regular rate and rhythm. No peripheral edema, cyanosis or pallor.  Gastrointestinal:  Soft, nondistended, nontender. No rebound or guarding. Normal bowel sounds. No appreciable masses or hepatomegaly. Rectal:  Not performed.  Msk:  Symmetrical without gross deformities. Without edema, no deformity or joint abnormality.  Neurologic:  Alert and  oriented x4;  grossly normal neurologically.  Skin:   Dry and intact without significant lesions or rashes. Psychiatric: Demonstrates good judgement and reason without abnormal affect or behaviors.  RELEVANT LABS AND IMAGING: CBC    Component Value Date/Time   WBC 18.4 (H) 01/23/2023 1112   WBC 9.6 01/14/2023 0949   RBC 5.15 (H) 01/23/2023 1112   HGB 11.2 (L) 01/23/2023 1112   HCT 37.7 01/23/2023 1112   PLT 337 01/23/2023 1112    MCV 73.2 (L) 01/23/2023 1112   MCH 21.7 (L) 01/23/2023 1112   MCHC 29.7 (L) 01/23/2023 1112   RDW 14.6 01/23/2023 1112   LYMPHSABS 3.3 01/23/2023 1112   MONOABS 0.3 01/23/2023 1112   EOSABS 0.0 01/23/2023 1112   BASOSABS 0.0 01/23/2023 1112    CMP     Component Value Date/Time   NA 135 01/23/2023 1112   K 3.8 01/23/2023 1112   CL 104 01/23/2023 1112   CO2 22 01/23/2023 1112   GLUCOSE 160 (H) 01/23/2023 1112   BUN 15 01/23/2023 1112   CREATININE  0.72 01/23/2023 1112   CREATININE 0.62 10/17/2022 0947   CALCIUM 9.3 01/23/2023 1112   PROT 7.7 01/23/2023 1112   ALBUMIN 3.8 01/23/2023 1112   AST 11 (L) 01/23/2023 1112   ALT 12 01/23/2023 1112   ALKPHOS 143 (H) 01/23/2023 1112   BILITOT 0.2 (L) 01/23/2023 1112   GFRNONAA >60 01/23/2023 1112   GFRNONAA 107 04/19/2019 1005   GFRAA 123 04/19/2019 1005    Assessment: 1.  Mucus in stools: Likely related to constipation/iron supplementation +/- irritable bowel type symptoms with increase in stress given new diagnosis of breast cancer 2.  Breast cancer: Diagnosed at the beginning of May 3.  Constipation: New with iron supplementation  Plan: 1.  Discussed with patient that mucus can be normal from the GI tract, especially if she was constipated/using iron for the first time and/or stressed given her new diagnosis of breast cancer.  Told her to let us know if it increases or worsens or she starts seeing any blood in her stool. 2.  For now recommend the patient start a fiber supplement such as Benefiber 1-2 times daily 3.  Also started MiraLAX on a daily basis given that she will be on iron for a while. 4.  Discussed with the patient that she will be due for a screening colonoscopy at the age of 37 or if her oncologist feels the need for her to have this done sooner. 5.  Patient will call if her symptoms increase, worsen or change.  She was assigned to Dr. Chales Abrahams today.  Hyacinth Meeker, PA-C [facility] 01/30/2023, 2:18  PM  Cc: Clayborne Dana, NP

## 2023-01-31 NOTE — Progress Notes (Signed)
Agree with assessment/plan.  Raj Karlye Ihrig, MD [facility] [phone]  

## 2023-02-03 ENCOUNTER — Encounter
Admission: RE | Admit: 2023-02-03 | Discharge: 2023-02-03 | Disposition: A | Discharge status: Home / Self Care | Payer: No Typology Code available for payment source | Source: Ambulatory Visit | Attending: Hematology & Oncology | Admitting: Hematology & Oncology

## 2023-02-03 DIAGNOSIS — Z171 Estrogen receptor negative status [ER-]: Secondary | ICD-10-CM | POA: Diagnosis present

## 2023-02-03 DIAGNOSIS — C50011 Malignant neoplasm of nipple and areola, right female breast: Secondary | ICD-10-CM | POA: Diagnosis present

## 2023-02-03 LAB — GLUCOSE, CAPILLARY: Glucose-Capillary: 87 mg/dL (ref 70–99)

## 2023-02-03 MED ORDER — FLUDEOXYGLUCOSE F - 18 (FDG) INJECTION
9.7000 | Freq: Once | INTRAVENOUS | Status: AC
  Administered 2023-02-03: 10.45 via INTRAVENOUS

## 2023-02-04 ENCOUNTER — Encounter: Payer: Self-pay | Admitting: *Deleted

## 2023-02-04 NOTE — Progress Notes (Unsigned)
Reviewed MRI and PET. Spoke with Dr Myna Hidalgo. He would like her to come in a discuss treatment options once port and echo complete.   Oncology Nurse Navigator Documentation     02/04/2023    1:00 PM  Oncology Nurse Navigator Flowsheets  Navigator Follow Up Date: 02/12/2023  Navigator Follow Up Reason: Follow-up Appointment  Navigator Location [facility]  Navigator Encounter Type Telephone  Telephone Appt Confirmation/Clarification;Education;Outgoing Call  Patient Visit Type MedOnc  Treatment Phase Pre-Tx/Tx Discussion  Barriers/Navigation Needs Coordination of Care;Education  Education Other  Interventions Coordination of Care;Education  Acuity Level 2-Minimal Needs (1-2 Barriers Identified)  Coordination of Care Appts  Education Method Verbal  Support Groups/Services Friends and Family  Time Spent with Patient 30

## 2023-02-05 ENCOUNTER — Other Ambulatory Visit: Payer: Self-pay | Admitting: Internal Medicine

## 2023-02-05 ENCOUNTER — Encounter: Payer: Self-pay | Admitting: Family Medicine

## 2023-02-05 ENCOUNTER — Other Ambulatory Visit (HOSPITAL_COMMUNITY)
Admission: RE | Admit: 2023-02-05 | Discharge: 2023-02-05 | Disposition: A | Discharge status: Home / Self Care | Payer: No Typology Code available for payment source | Source: Ambulatory Visit | Attending: Family Medicine | Admitting: Family Medicine

## 2023-02-05 ENCOUNTER — Ambulatory Visit: Payer: No Typology Code available for payment source | Admitting: Family Medicine

## 2023-02-05 VITALS — BP 145/90 | HR 99 | Ht 65.0 in | Wt 187.0 lb

## 2023-02-05 DIAGNOSIS — Z0001 Encounter for general adult medical examination with abnormal findings: Secondary | ICD-10-CM | POA: Diagnosis not present

## 2023-02-05 DIAGNOSIS — Z124 Encounter for screening for malignant neoplasm of cervix: Secondary | ICD-10-CM | POA: Insufficient documentation

## 2023-02-05 DIAGNOSIS — R87619 Unspecified abnormal cytological findings in specimens from cervix uteri: Secondary | ICD-10-CM

## 2023-02-05 DIAGNOSIS — Z171 Estrogen receptor negative status [ER-]: Secondary | ICD-10-CM

## 2023-02-05 DIAGNOSIS — I10 Essential (primary) hypertension: Secondary | ICD-10-CM | POA: Diagnosis not present

## 2023-02-05 MED ORDER — AMLODIPINE BESYLATE 10 MG PO TABS: 10.0000 mg | ORAL_TABLET | Freq: Every day | ORAL | 1 refills | Status: DC

## 2023-02-05 NOTE — Assessment & Plan Note (Signed)
Blood pressure is not at goal for age and co-morbidities.   Recommendations: increase amlodipine to 10 mg daily - Nurse visit in 2 weeks - BP goal <130/80 - monitor and log blood pressures at home - check around the same time each day in a relaxed setting - Limit salt to <2000 mg/day - Follow DASH eating plan (heart healthy diet) - limit alcohol to 2 standard drinks per day for men and 1 per day for women - avoid tobacco products - get at least 2 hours of regular aerobic exercise weekly Patient aware of signs/symptoms requiring further/urgent evaluation.

## 2023-02-05 NOTE — Patient Instructions (Signed)
Recent labs stable. No need to repeat right away. Keep all upcoming appointments  Blood pressure is not at goal for age and co-morbidities.   Recommendations: increase amlodipine to 10 mg daily - Nurse visit in 2 weeks - BP goal <130/80 - monitor and log blood pressures at home - check around the same time each day in a relaxed setting - Limit salt to <2000 mg/day - Follow DASH eating plan (heart healthy diet) - limit alcohol to 2 standard drinks per day for men and 1 per day for women - avoid tobacco products - get at least 2 hours of regular aerobic exercise weekly Patient aware of signs/symptoms requiring further/urgent evaluation.

## 2023-02-05 NOTE — Progress Notes (Signed)
Complete physical exam  Patient: Briana [name]   DOB: 19-Sep-1975   47 y.o. Female  MRN: [id number]  Subjective:    Chief Complaint  Patient presents with   Annual Exam    Briana [name] is a 47 y.o. female who presents today for a complete physical exam. She reports consuming a general  diet. The patient does not participate in regular exercise at present. She walks a lot at work. She generally feels well. She reports sleeping well. She does not have additional problems to discuss today.   Currently lives with: alone Acute concerns or interim problems since last visit:   Discussed the use of AI scribe software for clinical note transcription with the patient, who gave verbal consent to proceed.  History of Present Illness   The patient, recently diagnosed with invasive ductal carcinoma, presents for follow-up. She was found to have a left breast lump, which led to workup and subsequent diagnosis of a high-grade tumor with positive lymph node biopsy and lymphovascular space invasion. The tumor was ER negative, PR negative. She underwent a PET scan earlier this week and is scheduled to follow up soon with Dr. Myna Hidalgo to discuss options. She reports feeling anxious about her diagnosis but is otherwise feeling okay. She has a good support system and is maintaining a healthy diet as advised by a nutritionist. She continues to work and stay active.        Vision concerns: no concerns Dental concerns: no concerns STD concerns: no concerns  Patient denies ETOH use. Patient denies nicotine use. Patient denies illegal substance use.    Females:  She is not currently  sexually active She denies  concerns today about STI Contraception choices are: none LMP: 01/27/23      Most recent fall risk assessment:    02/05/2023   10:52 AM  Fall Risk   Falls in the past year? 0  Number falls in past yr: 0  Injury with Fall? 0  Risk for fall due to : No Fall Risks  Follow up Falls  evaluation completed     Most recent depression screenings:    02/05/2023   10:52 AM 01/23/2023   11:50 AM  PHQ 2/9 Scores  PHQ - 2 Score 0 0            Patient Care Team: Clayborne Dana, NP as PCP - General (Family Medicine) Comer, Belia Heman, MD as PCP - Infectious Diseases (Infectious Diseases) Gwendel Hanson, RN as Oncology Nurse Navigator Josph Macho, MD as Medical Oncologist (Oncology)   Outpatient Medications Prior to Visit  Medication Sig   bictegravir-emtricitabine-tenofovir AF (BIKTARVY) 50-200-25 MG TABS tablet TAKE 1 TABLET DAILY   ferrous sulfate 325 (65 FE) MG EC tablet Take 325 mg by mouth daily with breakfast.   predniSONE (DELTASONE) 20 MG tablet Take 20 mg by mouth daily with breakfast.   triamcinolone (KENALOG) 0.025 % ointment APPLY TOPICALLY TWICE DAILY AS NEEDED   [DISCONTINUED] amLODipine (NORVASC) 5 MG tablet Take 1 tablet (5 mg total) by mouth daily.   No facility-administered medications prior to visit.    ROS  All review of systems negative except what is listed in the HPI       Objective:     BP (!) 145/90   Pulse 99   Ht 5\' 5"  (1.651 m)   Wt 187 lb (84.8 kg)   LMP 01/02/2023   SpO2 100%   BMI 31.12 kg/m  Physical Exam Vitals reviewed. Exam conducted with a chaperone present.  Constitutional:      General: She is not in acute distress.    Appearance: Normal appearance. She is not ill-appearing.  HENT:     Head: Normocephalic and atraumatic.     Right Ear: Tympanic membrane normal.     Left Ear: Tympanic membrane normal.     Nose: Nose normal.     Mouth/Throat:     Mouth: Mucous membranes are moist.     Pharynx: Oropharynx is clear.  Eyes:     Extraocular Movements: Extraocular movements intact.     Conjunctiva/sclera: Conjunctivae normal.     Pupils: Pupils are equal, round, and reactive to light.  Neck:     Vascular: No carotid bruit.  Cardiovascular:     Rate and Rhythm: Normal rate and regular rhythm.      Pulses: Normal pulses.     Heart sounds: Murmur heard.  Pulmonary:     Effort: Pulmonary effort is normal.     Breath sounds: Normal breath sounds.  Abdominal:     General: Abdomen is flat. Bowel sounds are normal. There is no distension.     Palpations: Abdomen is soft. There is no mass.     Tenderness: There is no abdominal tenderness. There is no right CVA tenderness, left CVA tenderness, guarding or rebound.  Genitourinary:    Urethra: Urethral swelling present.     Cervix: No cervical motion tenderness or discharge.     Uterus: Not tender.      Adnexa:        Right: No tenderness.         Left: No tenderness.       Comments: Small dark lesion approximately 10:00 Musculoskeletal:        General: Normal range of motion.     Cervical back: Normal range of motion and neck supple. No tenderness.     Right lower leg: No edema.     Left lower leg: No edema.  Lymphadenopathy:     Cervical: No cervical adenopathy.  Skin:    General: Skin is warm and dry.     Capillary Refill: Capillary refill takes less than 2 seconds.  Neurological:     General: No focal deficit present.     Mental Status: She is alert and oriented to person, place, and time. Mental status is at baseline.  Psychiatric:        Mood and Affect: Mood normal.        Behavior: Behavior normal.        Thought Content: Thought content normal.        Judgment: Judgment normal.      No results found for any visits on 02/05/23.     Assessment & Plan:    Routine Health Maintenance and Physical Exam Discussed health promotion and safety including diet and exercise recommendations, dental health, and injury prevention. Tobacco cessation if applicable. Seat belts, sunscreen, smoke detectors, etc.    Immunization History  Administered Date(s) Administered   Hepatitis A, Adult 10/08/2021   Hepatitis B 01/23/2011, 03/06/2011, 12/15/2011   Hepb-cpg 10/08/2021   Influenza Whole 06/01/2006, 04/17/2011    Influenza,inj,Quad PF,6+ Mos 08/18/2013, 05/23/2014, 05/23/2019, 10/03/2020   PNEUMOCOCCAL CONJUGATE-20 10/08/2021   PPD Test 07/18/2014   Pfizer Covid-19 Vaccine Bivalent Booster 30yrs & up 10/08/2021   Pneumococcal Conjugate-13 06/23/2019   Pneumococcal Polysaccharide-23 01/23/2011, 09/13/2019   Tdap 10/12/2017    Health Maintenance  Topic Date Due  PAP SMEAR-Modifier  05/22/2020   COVID-19 Vaccine (2 - Pfizer risk series) 01/14/2024 (Originally 10/29/2021)   INFLUENZA VACCINE  04/02/2023   MAMMOGRAM  01/30/2024   DTaP/Tdap/Td (2 - Td or Tdap) 10/13/2027   Hepatitis C Screening  Completed   HIV Screening  Completed   HPV VACCINES  Aged Out        Problem List Items Addressed This Visit    Problem List Items Addressed This Visit     Screening for cervical cancer    Pap today Mildly abnormal exam. Unsure of previous. Will see pap results and likely recommend she follow-up again with her GYN.      Relevant Orders   Cytology - PAP( [place])   High blood pressure - Primary    Blood pressure is not at goal for age and co-morbidities.   Recommendations: increase amlodipine to 10 mg daily - Nurse visit in 2 weeks - BP goal <130/80 - monitor and log blood pressures at home - check around the same time each day in a relaxed setting - Limit salt to <2000 mg/day - Follow DASH eating plan (heart healthy diet) - limit alcohol to 2 standard drinks per day for men and 1 per day for women - avoid tobacco products - get at least 2 hours of regular aerobic exercise weekly Patient aware of signs/symptoms requiring further/urgent evaluation.      Relevant Medications   amLODipine (NORVASC) 10 MG tablet   Other Visit Diagnoses     Encounter for routine adult health examination with abnormal findings     Oncology following labs closely. Not rechecking today. Keep all upcoming appointments.          Return in about 2 weeks (around 02/19/2023) for BP check with nurse.      Clayborne Dana, NP

## 2023-02-05 NOTE — Consult Note (Signed)
Chief Complaint: Patient was seen in consultation today for Port-A-Cath placement  Referring Physician(s): Ennever,Peter R  Supervising Physician: Mir, Mauri Reading  Patient Status: [facility] - Out-pt  History of Present Illness: Briana [name] is a 47 y.o. female with past medical history of anemia,HIV and recently diagnosed locally advanced left breast carcinoma.  She is scheduled today for Port-A-Cath placement to assist with treatment.  Past Medical History:  Diagnosis Date   Abnormal Pap smear    s/p colposcopy    Anemia    Breast cancer (HCC) 12/2022   Chronic kidney disease 03/01/2013   kidney infection   HIV infection (HCC)    Screening for malignant neoplasm of the cervix     Past Surgical History:  Procedure Laterality Date   BREAST BIOPSY Left 01/20/2023   [place] LT BREAST BX W LOC DEV 1ST LESION IMG BX SPEC US GUIDE 01/20/2023 [facility]   CESAREAN SECTION     2000/2008   TUBAL LIGATION      Allergies: Pumpkin flavor  Medications: Prior to Admission medications   Medication Sig Start Date End Date Taking? Authorizing Provider  amLODipine (NORVASC) 10 MG tablet Take 1 tablet (10 mg total) by mouth daily. 02/05/23   Clayborne Dana, NP  bictegravir-emtricitabine-tenofovir AF (BIKTARVY) 50-200-25 MG TABS tablet TAKE 1 TABLET DAILY 01/08/23   Comer, Belia Heman, MD  ferrous sulfate 325 (65 FE) MG EC tablet Take 325 mg by mouth daily with breakfast.    [provider]  predniSONE (DELTASONE) 20 MG tablet Take 20 mg by mouth daily with breakfast. 01/15/23   [provider]  triamcinolone (KENALOG) 0.025 % ointment APPLY TOPICALLY TWICE DAILY AS NEEDED 01/19/23   Comer, Belia Heman, MD     Family History  Problem Relation Age of Onset   Eczema Mother    Diabetes Father    Hypertension Father    Diabetes Maternal Grandmother    Hypertension Maternal Grandmother    Diabetes Maternal Grandfather    Hypertension Maternal Grandfather    Diabetes Paternal  Grandmother    Hypertension Paternal Grandmother    Diabetes Paternal Grandfather    Hypertension Paternal Grandfather     Social History   Socioeconomic History   Marital status: Single    Spouse name: Not on file   Number of children: Not on file   Years of education: Not on file   Highest education level: Not on file  Occupational History   Not on file  Tobacco Use   Smoking status: Never   Smokeless tobacco: Never  Vaping Use   Vaping Use: Never used  Substance and Sexual Activity   Alcohol use: No   Drug use: No   Sexual activity: Not Currently    Partners: Male  Other Topics Concern   Not on file  Social History Narrative   Not on file   Social Determinants of Health   Financial Resource Strain: Not on file  Food Insecurity: No Food Insecurity (01/23/2023)   Hunger Vital Sign    Worried About Running Out of Food in the Last Year: Never true    Ran Out of Food in the Last Year: Never true  Transportation Needs: No Transportation Needs (01/23/2023)   PRAPARE - Administrator, Civil Service (Medical): No    Lack of Transportation (Non-Medical): No  Physical Activity: Not on file  Stress: Not on file  Social Connections: Not on file     Review of Systems denies  fever,HA,CP,dyspnea, cough, abd/back pain,N/V or bleeding  Vital Signs: Vitals:   02/06/23 1125  BP: (!) 157/95  Pulse: (!) 105  Resp: 18  Temp: 98.5 F (36.9 C)  SpO2: 98%    LMP 01/02/2023   Code Status: FULL CODE  Physical Exam: awake/alert; chest- CTA bilat; heart- sl tachy but reg rhythm; abd- soft,+BS,NT; no LE edema  Imaging: NM PET Image Initial (PI) Skull Base To Thigh  Result Date: 02/03/2023 CLINICAL DATA:  Initial treatment strategy for breast cancer. EXAM: NUCLEAR MEDICINE PET SKULL BASE TO THIGH TECHNIQUE: 10.45 mCi F-18 FDG was injected intravenously. Full-ring PET imaging was performed from the skull base to thigh after the radiotracer. CT data was obtained and  used for attenuation correction and anatomic localization. Fasting blood glucose: 87 mg/dl COMPARISON:  None Available. FINDINGS: Mediastinal blood pool activity: SUV max 2.1 Liver activity: SUV max NA NECK: No hypermetabolic lymph nodes in the neck. Incidental CT findings: None. CHEST: Hypermetabolic left breast mass with associated skin thickening with SUV max of 11.6 measures 4.7 x 4.0 cm on series 3, image 58. Hypermetabolic left axillary, subpectoral and supraclavicular lymph nodes. Reference left axillary lymph node measuring 2.0 cm in short axis series 3, image 47 SUV max of 10.3. Tiny hypermetabolic left intramammary lymph node with SUV max of 6.1, measures 4 mm on series 3, image 60. No hypermetabolic mediastinal or hilar nodes. No suspicious pulmonary nodules on the CT scan. Incidental CT findings: None. ABDOMEN/PELVIS: No abnormal hypermetabolic activity within the liver, pancreas, adrenal glands, or spleen. No hypermetabolic lymph nodes in the abdomen or pelvis. Incidental CT findings: None. SKELETON: No focal hypermetabolic activity to suggest skeletal metastasis. Incidental CT findings: None. IMPRESSION: 1. Hypermetabolic left breast mass, consistent with primary breast malignancy. 2. Hypermetabolic left axillary, left subpectoral and left supraclavicular lymph nodes, consistent with nodal metastatic disease. 3. Tiny hypermetabolic left intramammary lymph node, consistent with additional site of nodal metastatic disease. 4. No evidence metastatic disease in the abdomen or pelvis. Electronically Signed   By: Allegra Lai M.D.   On: 02/03/2023 16:35   MR BREAST BILATERAL W WO CONTRAST INC CAD  Result Date: 01/30/2023 CLINICAL DATA:  Patient with recent diagnosis of left breast carcinoma. EXAM: BILATERAL BREAST MRI WITH AND WITHOUT CONTRAST TECHNIQUE: Multiplanar, multisequence MR images of both breasts were obtained prior to and following the intravenous administration of 10 ml of Vueway.  Three-dimensional MR images were rendered by post-processing of the original MR data on an independent workstation. The three-dimensional MR images were interpreted, and findings are reported in the following complete MRI report for this study. Three dimensional images were evaluated at the independent interpreting workstation using the DynaCAD thin client. COMPARISON:  Previous exam(s). FINDINGS: Breast composition: c. Heterogeneous fibroglandular tissue. Background parenchymal enhancement: Mild Right breast: No mass or abnormal enhancement. Left breast: Within the lateral/upper outer left breast there is a large lobular mass compatible with biopsy-proven malignancy measuring up to 9.2 x 6.3 cm. There is extensive non mass enhancement surrounding this mass. The non-mass enhancement extends anterior to the level of the nipple-areolar complex as well as medially to involve the anterior upper inner quadrant of the left breast. The non mass enhancement within the anterior upper inner left breast measures up to 4.6 x 3.4 cm. In total these findings measure approximately 12.3 x 9.7 cm. Additionally within the anterior lower inner left breast there is a 7 mm oval enhancing mass. Lymph nodes: There are at least nine bulky enlarged left  axillary lymph nodes with the largest measuring up to 3 cm in greatest width, compatible with extensive metastatic left axillary adenopathy. Ancillary findings:  None. IMPRESSION: 1. Large lobular mass within the lateral/upper outer left breast compatible with biopsy-proven malignancy. There is extensive non mass enhancement surrounding this mass as well as extending anterior to the level of the nipple-areolar complex as well as medially to involve the anterior upper inner quadrant of the left breast. In total these findings measure approximately 12.3 x 9.7 cm. Additionally there is a 7 mm oval enhancing mass within the anterior lower inner left breast. Overall disease burden likely involves  3 quadrants of the left breast. Note however that there has only been 1 biopsy of the large mass within the upper-outer left breast. 2. Extensive metastatic left axillary adenopathy. RECOMMENDATION: Findings are suggestive of extensive malignancy throughout the left breast. If breast conservation is being contemplated, patient would need multiple additional biopsies of the left breast to further define the extent of disease both including MRI guided core needle biopsies and stereotactic guided core needle biopsies. Treatment plan for known left breast malignancy. BI-RADS CATEGORY  6: Known biopsy-proven malignancy. Electronically Signed   By: Annia Belt M.D.   On: 01/30/2023 11:48  [place] LT BREAST BX W LOC DEV 1ST LESION IMG BX SPEC US GUIDE  Addendum Date: 01/27/2023   ADDENDUM REPORT: 01/27/2023 10:17 ADDENDUM: PATHOLOGY revealed: Site 1. Breast, LEFT, needle core biopsy, 1:00 5 cm fn, venus INVASIVE MAMMARY CARCINOMA, SEE NOTE TUBULE FORMATION: SCORE 3 NUCLEAR PLEOMORPHISM: SCORE 3 MITOTIC COUNT: SCORE 3 TOTAL SCORE: 9 OVERALL GRADE: 3 LYMPHOVASCULAR INVASION: IDENTIFIED CANCER LENGTH: 15 MM CALCIFICATIONS: NOT IDENTIFIED OTHER FINDINGS: NONE - SEE NOTE Pathology results are CONCORDANT with imaging findings, per Dr. Annia Belt. PATHOLOGY revealed: Site 2. Lymph node, needle/core biopsy, LEFT axillary, hydromark ONE LYMPH NODE, POSITIVE FOR METASTATIC CARCINOMA (0/1) Pathology results are CONCORDANT with imaging findings, per Dr. Annia Belt. Pathology results and recommendations below were discussed with patient by telephone on 01/21/2023. Patient reported biopsy site within normal limits with slight tenderness at the site. Post biopsy care instructions were reviewed, questions were answered and my direct phone number was provided to patient. Patient was instructed to call [facility] if any concerns or questions arise related to the biopsy. RECOMMENDATION: Surgical consultation has been  arranged for patient to see Dr. Magnus Ivan at [facility] on 01/28/2023. Needs a LEFT breast MRI for extent. Pathology results reported by Lynett Grimes, RN on 01/21/2023. Electronically Signed   By: Annia Belt M.D.   On: 01/27/2023 10:17   Result Date: 01/27/2023 CLINICAL DATA:  Suspicious left breast mass and left axillary adenopathy. EXAM: ULTRASOUND GUIDED LEFT BREAST CORE NEEDLE BIOPSY COMPARISON:  Previous exam(s). PROCEDURE: I met with the patient and we discussed the procedure of ultrasound-guided biopsy, including benefits and alternatives. We discussed the high likelihood of a successful procedure. We discussed the risks of the procedure, including infection, bleeding, tissue injury, clip migration, and inadequate sampling. Informed written consent was given. The usual time-out protocol was performed immediately prior to the procedure. Site 1: Left breast mass 1 o'clock position: Venous clip Lesion quadrant: Upper outer quadrant Using sterile technique and 1% Lidocaine as local anesthetic, under direct ultrasound visualization, a 14 gauge spring-loaded device was used to perform biopsy of left breast mass 1 o'clock position using a lateral approach. At the conclusion of the procedure venous shaped tissue marker clip was deployed into the biopsy cavity.  Follow up 2 view mammogram was performed and dictated separately. Site 2: Left axillary lymph node: HydroMARK clip Lesion quadrant: Upper outer quadrant Using sterile technique and 1% Lidocaine as local anesthetic, under direct ultrasound visualization, a 14 gauge spring-loaded device was used to perform biopsy of left axillary node using a lateral approach. At the conclusion of the procedure [facility] shaped tissue marker clip was deployed into the biopsy cavity. Follow up 2 view mammogram was performed and dictated separately. IMPRESSION: Ultrasound guided biopsy of left breast mass and left axillary node. No apparent complications.  Electronically Signed: By: Annia Belt M.D. On: 01/20/2023 15:40  [place] AXILLARY NODE CORE BIOPSY LEFT  Addendum Date: 01/27/2023   ADDENDUM REPORT: 01/27/2023 10:17 ADDENDUM: PATHOLOGY revealed: Site 1. Breast, LEFT, needle core biopsy, 1:00 5 cm fn, venus INVASIVE MAMMARY CARCINOMA, SEE NOTE TUBULE FORMATION: SCORE 3 NUCLEAR PLEOMORPHISM: SCORE 3 MITOTIC COUNT: SCORE 3 TOTAL SCORE: 9 OVERALL GRADE: 3 LYMPHOVASCULAR INVASION: IDENTIFIED CANCER LENGTH: 15 MM CALCIFICATIONS: NOT IDENTIFIED OTHER FINDINGS: NONE - SEE NOTE Pathology results are CONCORDANT with imaging findings, per Dr. Annia Belt. PATHOLOGY revealed: Site 2. Lymph node, needle/core biopsy, LEFT axillary, hydromark ONE LYMPH NODE, POSITIVE FOR METASTATIC CARCINOMA (0/1) Pathology results are CONCORDANT with imaging findings, per Dr. Annia Belt. Pathology results and recommendations below were discussed with patient by telephone on 01/21/2023. Patient reported biopsy site within normal limits with slight tenderness at the site. Post biopsy care instructions were reviewed, questions were answered and my direct phone number was provided to patient. Patient was instructed to call [facility] if any concerns or questions arise related to the biopsy. RECOMMENDATION: Surgical consultation has been arranged for patient to see Dr. Magnus Ivan at [facility] on 01/28/2023. Needs a LEFT breast MRI for extent. Pathology results reported by Lynett Grimes, RN on 01/21/2023. Electronically Signed   By: Annia Belt M.D.   On: 01/27/2023 10:17   Result Date: 01/27/2023 CLINICAL DATA:  Suspicious left breast mass and left axillary adenopathy. EXAM: ULTRASOUND GUIDED LEFT BREAST CORE NEEDLE BIOPSY COMPARISON:  Previous exam(s). PROCEDURE: I met with the patient and we discussed the procedure of ultrasound-guided biopsy, including benefits and alternatives. We discussed the high likelihood of a successful procedure. We discussed the risks of  the procedure, including infection, bleeding, tissue injury, clip migration, and inadequate sampling. Informed written consent was given. The usual time-out protocol was performed immediately prior to the procedure. Site 1: Left breast mass 1 o'clock position: Venous clip Lesion quadrant: Upper outer quadrant Using sterile technique and 1% Lidocaine as local anesthetic, under direct ultrasound visualization, a 14 gauge spring-loaded device was used to perform biopsy of left breast mass 1 o'clock position using a lateral approach. At the conclusion of the procedure venous shaped tissue marker clip was deployed into the biopsy cavity. Follow up 2 view mammogram was performed and dictated separately. Site 2: Left axillary lymph node: HydroMARK clip Lesion quadrant: Upper outer quadrant Using sterile technique and 1% Lidocaine as local anesthetic, under direct ultrasound visualization, a 14 gauge spring-loaded device was used to perform biopsy of left axillary node using a lateral approach. At the conclusion of the procedure [facility] shaped tissue marker clip was deployed into the biopsy cavity. Follow up 2 view mammogram was performed and dictated separately. IMPRESSION: Ultrasound guided biopsy of left breast mass and left axillary node. No apparent complications. Electronically Signed: By: Annia Belt M.D. On: 01/20/2023 15:40  MM CLIP PLACEMENT LEFT  Result Date:  01/20/2023 CLINICAL DATA:  Status post ultrasound biopsy left breast mass and left axillary lymph node. EXAM: 3D DIAGNOSTIC LEFT MAMMOGRAM POST ULTRASOUND BIOPSY COMPARISON:  Previous exam(s). FINDINGS: 3D Mammographic images were obtained following ultrasound guided biopsy of left breast mass and left axillary lymph node. Site 1: Left breast mass 1 o'clock position: Venous clip: In appropriate position. Site 2: Left axillary lymph node: HydroMARK clip: In appropriate position. IMPRESSION: Appropriate positioning of the biopsy marking clips as above.  Final Assessment: Post Procedure Mammograms for Marker Placement Electronically Signed   By: Annia Belt M.D.   On: 01/20/2023 15:53  US THYROID  Result Date: 01/20/2023 CLINICAL DATA:  Hyperthyroid.  Subclinical hyperthyroidism. EXAM: THYROID ULTRASOUND TECHNIQUE: Ultrasound examination of the thyroid gland and adjacent soft tissues was performed. COMPARISON:  None Available. FINDINGS: Parenchymal Echotexture: Mildly heterogenous - no evidence of glandular hyperemia. Isthmus: Normal in size measuring 0.4 cm in diameter Right lobe: Normal in size measuring 5.2 x 1.6 x 1.5 cm Left lobe: Normal in size measuring 4.9 x 1.7 x 1.3 cm _________________________________________________________ Estimated total number of nodules >/= 1 cm: 0 Number of spongiform nodules >/=  2 cm not described below (TR1): 0 Number of mixed cystic and solid nodules >/= 1.5 cm not described below (TR2): 0 _________________________________________________________ No discrete nodules are seen within the thyroid gland. IMPRESSION: Mildly heterogeneous but otherwise normal-appearing and normal-sized thyroid without discrete nodule or mass. Electronically Signed   By: Simonne Come M.D.   On: 01/20/2023 12:12   MM 3D DIAGNOSTIC MAMMOGRAM BILATERAL BREAST  Result Date: 01/19/2023 CLINICAL DATA:  47 year old female presenting for evaluation of a palpable lump in the left breast. EXAM: DIGITAL DIAGNOSTIC BILATERAL MAMMOGRAM WITH TOMOSYNTHESIS; ULTRASOUND LEFT BREAST LIMITED TECHNIQUE: Bilateral digital diagnostic mammography and breast tomosynthesis was performed.; Targeted ultrasound examination of the left breast was performed. COMPARISON:  None available. ACR Breast Density Category b: There are scattered areas of fibroglandular density. FINDINGS: Spot compression tomosynthesis images over the upper-outer quadrant of the left breast, mid to posterior depth, demonstrates a large irregular mass spanning approximately the 6.4 x 5.4 cm in the  craniocaudal dimension. There is distortion immediately posterior to the mass associated with amorphous and punctate calcifications. Calcifications extend anterior to the mass as well, and there is asymmetrically dense tissue extending from the mass to the base of the nipple. All together, the mass, calcifications and asymmetry span approximately 12 cm in the anterior to posterior dimension. There is skin thickening along the anterior half of the breast. Numerous abnormal lymph nodes are seen on the MLO view. There is a separate small group of round calcifications in the central mid depth of the left breast measuring 8 mm. Ultrasound targeted to the upper-outer quadrant of the left breast demonstrates a large irregular mass measuring at least 5.2 cm. It is difficult to accurately measure the mass sonographically due to its irregular shape and size. Ultrasound of the left axilla demonstrates at least 4 markedly abnormal lymph nodes. IMPRESSION: 1. Highly suspicious mass in the upper-outer quadrant of the left breast with associated distortion, calcifications and asymmetry. The abnormalities all together span approximately 12 cm. 2. There is a 1.6 cm group of calcifications in the central left breast. 3. There are at least 4, and likely more abnormal left axillary lymph nodes. RECOMMENDATION: 1. Ultrasound-guided biopsy is recommended for the left breast mass and one of the left axillary lymph nodes. 2. Following biopsy, MRI is recommended to determine extent of disease, evaluate for extent  of lymphadenopathy and screen the contralateral breast given the patient's premenopausal status. I have discussed the findings and recommendations with the patient. If applicable, a reminder letter will be sent to the patient regarding the next appointment. BI-RADS CATEGORY  5: Highly suggestive of malignancy. Electronically Signed   By: Frederico Hamman M.D.   On: 01/19/2023 15:07  [place] LIMITED ULTRASOUND INCLUDING AXILLA LEFT  BREAST   Result Date: 01/19/2023 CLINICAL DATA:  47 year old female presenting for evaluation of a palpable lump in the left breast. EXAM: DIGITAL DIAGNOSTIC BILATERAL MAMMOGRAM WITH TOMOSYNTHESIS; ULTRASOUND LEFT BREAST LIMITED TECHNIQUE: Bilateral digital diagnostic mammography and breast tomosynthesis was performed.; Targeted ultrasound examination of the left breast was performed. COMPARISON:  None available. ACR Breast Density Category b: There are scattered areas of fibroglandular density. FINDINGS: Spot compression tomosynthesis images over the upper-outer quadrant of the left breast, mid to posterior depth, demonstrates a large irregular mass spanning approximately the 6.4 x 5.4 cm in the craniocaudal dimension. There is distortion immediately posterior to the mass associated with amorphous and punctate calcifications. Calcifications extend anterior to the mass as well, and there is asymmetrically dense tissue extending from the mass to the base of the nipple. All together, the mass, calcifications and asymmetry span approximately 12 cm in the anterior to posterior dimension. There is skin thickening along the anterior half of the breast. Numerous abnormal lymph nodes are seen on the MLO view. There is a separate small group of round calcifications in the central mid depth of the left breast measuring 8 mm. Ultrasound targeted to the upper-outer quadrant of the left breast demonstrates a large irregular mass measuring at least 5.2 cm. It is difficult to accurately measure the mass sonographically due to its irregular shape and size. Ultrasound of the left axilla demonstrates at least 4 markedly abnormal lymph nodes. IMPRESSION: 1. Highly suspicious mass in the upper-outer quadrant of the left breast with associated distortion, calcifications and asymmetry. The abnormalities all together span approximately 12 cm. 2. There is a 1.6 cm group of calcifications in the central left breast. 3. There are at least  4, and likely more abnormal left axillary lymph nodes. RECOMMENDATION: 1. Ultrasound-guided biopsy is recommended for the left breast mass and one of the left axillary lymph nodes. 2. Following biopsy, MRI is recommended to determine extent of disease, evaluate for extent of lymphadenopathy and screen the contralateral breast given the patient's premenopausal status. I have discussed the findings and recommendations with the patient. If applicable, a reminder letter will be sent to the patient regarding the next appointment. BI-RADS CATEGORY  5: Highly suggestive of malignancy. Electronically Signed   By: Frederico Hamman M.D.   On: 01/19/2023 15:07   Labs:  CBC: Recent Labs    10/17/22 0947 01/14/23 0949 01/23/23 1112  WBC 9.5 9.6 18.4*  HGB 11.0* 11.1* 11.2*  HCT 33.9* 35.3* 37.7  PLT 371 340.0 337    COAGS: No results for input(s): "INR", "APTT" in the last 8760 hours.  BMP: Recent Labs    10/17/22 0947 01/14/23 0949 01/23/23 1112  NA 138 133* 135  K 4.0 4.0 3.8  CL 108 103 104  CO2 21 23 22   GLUCOSE 92 81 160*  BUN 10 10 15   CALCIUM 9.5 9.3 9.3  CREATININE 0.62 0.59 0.72  GFRNONAA  --   --  >60    LIVER FUNCTION TESTS: Recent Labs    10/17/22 0947 01/14/23 0949 01/23/23 1112  BILITOT 0.2 0.3 0.2*  AST 15  15 11*  ALT 12 11 12   ALKPHOS  --  168* 143*  PROT 8.6* 8.3 7.7  ALBUMIN  --  3.7 3.8    TUMOR MARKERS: No results for input(s): "AFPTM", "CEA", "CA199", "CHROMGRNA" in the last 8760 hours.  Assessment and Plan: 47 y.o. female with past medical history of anemia,HIV and recently diagnosed locally advanced left breast carcinoma.  She is scheduled today for Port-A-Cath placement to assist with treatment.Risks and benefits of image guided port-a-catheter placement was discussed with the patient including, but not limited to bleeding, infection, pneumothorax, or fibrin sheath development and need for additional procedures.  All of the patient's questions were  answered, patient is agreeable to proceed. Consent signed and in chart.    Thank you for this interesting consult.  I greatly enjoyed meeting Briana [name] and look forward to participating in their care.  A copy of this report was sent to the requesting provider on this date.  Electronically Signed: D. Jeananne Rama, PA-C 02/05/2023, 1:36 PM   I spent a total of 25 minutes  in face to face in clinical consultation, greater than 50% of which was counseling/coordinating care for port a cath placement

## 2023-02-05 NOTE — Assessment & Plan Note (Signed)
Pap today Mildly abnormal exam. Unsure of previous. Will see pap results and likely recommend she follow-up again with her GYN.

## 2023-02-06 ENCOUNTER — Ambulatory Visit (HOSPITAL_COMMUNITY)
Admission: RE | Admit: 2023-02-06 | Discharge: 2023-02-06 | Disposition: A | Discharge status: Home / Self Care | Payer: No Typology Code available for payment source | Source: Ambulatory Visit | Attending: Hematology & Oncology | Admitting: Hematology & Oncology

## 2023-02-06 ENCOUNTER — Encounter (HOSPITAL_COMMUNITY): Payer: Self-pay

## 2023-02-06 DIAGNOSIS — Z171 Estrogen receptor negative status [ER-]: Secondary | ICD-10-CM | POA: Diagnosis not present

## 2023-02-06 DIAGNOSIS — C50011 Malignant neoplasm of nipple and areola, right female breast: Secondary | ICD-10-CM | POA: Insufficient documentation

## 2023-02-06 HISTORY — PX: IR IMAGING GUIDED PORT INSERTION: IMG5740

## 2023-02-06 MED ORDER — HEPARIN SOD (PORK) LOCK FLUSH 100 UNIT/ML IV SOLN
500.0000 [IU] | Freq: Once | INTRAVENOUS | Status: AC
  Administered 2023-02-06: 500 [IU] via INTRAVENOUS

## 2023-02-06 MED ORDER — HEPARIN SOD (PORK) LOCK FLUSH 100 UNIT/ML IV SOLN
INTRAVENOUS | Status: AC
  Filled 2023-02-06: qty 5

## 2023-02-06 MED ORDER — FENTANYL CITRATE (PF) 100 MCG/2ML IJ SOLN
INTRAMUSCULAR | Status: AC
  Filled 2023-02-06: qty 2

## 2023-02-06 MED ORDER — MIDAZOLAM HCL 2 MG/2ML IJ SOLN
INTRAMUSCULAR | Status: AC
  Filled 2023-02-06: qty 2

## 2023-02-06 MED ORDER — SODIUM CHLORIDE 0.9 % IV SOLN: INTRAVENOUS | Status: DC

## 2023-02-06 MED ORDER — LIDOCAINE-EPINEPHRINE 1 %-1:100000 IJ SOLN
INTRAMUSCULAR | Status: AC
  Filled 2023-02-06: qty 1

## 2023-02-06 MED ORDER — LIDOCAINE-EPINEPHRINE 1 %-1:100000 IJ SOLN
20.0000 mL | Freq: Once | INTRAMUSCULAR | Status: AC
  Administered 2023-02-06: 15 mL via INTRADERMAL

## 2023-02-06 MED ORDER — FENTANYL CITRATE (PF) 100 MCG/2ML IJ SOLN
INTRAMUSCULAR | Status: AC | PRN
  Administered 2023-02-06 (×2): 50 ug via INTRAVENOUS

## 2023-02-06 MED ORDER — MIDAZOLAM HCL 2 MG/2ML IJ SOLN
INTRAMUSCULAR | Status: AC | PRN
  Administered 2023-02-06 (×2): 1 mg via INTRAVENOUS

## 2023-02-06 NOTE — Procedures (Signed)
Interventional Radiology Procedure Note  Procedure: Chest Port  Indication: Breast Ca  Findings: Please refer to procedural dictation for full description.  Complications: None  EBL: < 10 mL  Acquanetta Belling, MD [phone]

## 2023-02-06 NOTE — Discharge Instructions (Addendum)

## 2023-02-10 ENCOUNTER — Ambulatory Visit (HOSPITAL_COMMUNITY)
Admission: RE | Admit: 2023-02-10 | Discharge: 2023-02-10 | Disposition: A | Discharge status: Home / Self Care | Payer: No Typology Code available for payment source | Source: Ambulatory Visit | Attending: Family Medicine | Admitting: Family Medicine

## 2023-02-10 DIAGNOSIS — Z171 Estrogen receptor negative status [ER-]: Secondary | ICD-10-CM

## 2023-02-10 DIAGNOSIS — T451X5A Adverse effect of antineoplastic and immunosuppressive drugs, initial encounter: Secondary | ICD-10-CM | POA: Insufficient documentation

## 2023-02-10 DIAGNOSIS — C50011 Malignant neoplasm of nipple and areola, right female breast: Secondary | ICD-10-CM

## 2023-02-10 DIAGNOSIS — Z0189 Encounter for other specified special examinations: Secondary | ICD-10-CM

## 2023-02-10 LAB — ECHOCARDIOGRAM COMPLETE
AR max vel: 1.78 cm2
AV Area VTI: 2 cm2
AV Area mean vel: 1.95 cm2
AV Mean grad: 7 mmHg
AV Peak grad: 11.8 mmHg
Ao pk vel: 1.72 m/s
Area-P 1/2: 2.92 cm2
S' Lateral: 2.2 cm

## 2023-02-10 LAB — CYTOLOGY - PAP
Adequacy: ABSENT
Comment: NEGATIVE
Comment: NEGATIVE
Comment: NEGATIVE
HPV 16: NEGATIVE
HPV 18 / 45: POSITIVE — AB
High risk HPV: POSITIVE — AB

## 2023-02-11 ENCOUNTER — Other Ambulatory Visit: Payer: Self-pay

## 2023-02-11 ENCOUNTER — Ambulatory Visit (INDEPENDENT_AMBULATORY_CARE_PROVIDER_SITE_OTHER): Payer: No Typology Code available for payment source | Admitting: Family Medicine

## 2023-02-11 ENCOUNTER — Encounter: Payer: Self-pay | Admitting: Family Medicine

## 2023-02-11 VITALS — BP 139/91 | HR 92 | Ht 65.0 in | Wt 190.0 lb

## 2023-02-11 DIAGNOSIS — I10 Essential (primary) hypertension: Secondary | ICD-10-CM | POA: Diagnosis not present

## 2023-02-11 DIAGNOSIS — B20 Human immunodeficiency virus [HIV] disease: Secondary | ICD-10-CM

## 2023-02-11 DIAGNOSIS — Z171 Estrogen receptor negative status [ER-]: Secondary | ICD-10-CM

## 2023-02-11 MED ORDER — HYDROCHLOROTHIAZIDE 12.5 MG PO TABS
12.5000 mg | ORAL_TABLET | Freq: Every day | ORAL | 3 refills | Status: DC
Start: 2023-02-11 — End: 2023-12-20

## 2023-02-11 NOTE — Addendum Note (Signed)
Addended by: Hyman Hopes B on: 02/11/2023 08:07 AM   Modules accepted: Orders

## 2023-02-11 NOTE — Patient Instructions (Signed)
Blood pressure is not at goal for age and co-morbidities.   Recommendations: continue amlodipine 10 mg daily, adding HCTZ 12.5 mg daily; follow-up in 2-3 weeks for recheck - BP goal <130/80 - monitor and log blood pressures at home - check around the same time each day in a relaxed setting - Limit salt to <2000 mg/day - Follow DASH eating plan (heart healthy diet) - limit alcohol to 2 standard drinks per day for men and 1 per day for women - avoid tobacco products - get at least 2 hours of regular aerobic exercise weekly Patient aware of signs/symptoms requiring further/urgent evaluation.

## 2023-02-11 NOTE — Assessment & Plan Note (Signed)
Blood pressure is not at goal for age and co-morbidities.   Recommendations: continue amlodipine 10 mg daily, adding HCTZ 12.5 mg daily; follow-up in 2-3 weeks for recheck - BP goal <130/80 - monitor and log blood pressures at home - check around the same time each day in a relaxed setting - Limit salt to <2000 mg/day - Follow DASH eating plan (heart healthy diet) - limit alcohol to 2 standard drinks per day for men and 1 per day for women - avoid tobacco products - get at least 2 hours of regular aerobic exercise weekly Patient aware of signs/symptoms requiring further/urgent evaluation.   

## 2023-02-11 NOTE — Progress Notes (Signed)
Established Patient Office Visit  Subjective   Patient ID: Briana [name], female    DOB: 05-23-1976  Age: 47 y.o. MRN: [id number]  Chief Complaint  Patient presents with   Hypertension     Discussed the use of AI scribe software for clinical note transcription with the patient, who gave verbal consent to proceed.  History of Present Illness   The patient, with a history of hypertension, presents for a follow-up visit after a recent increase in amlodipine to 10 mg due to elevated blood pressure. They deny any side effects from the medication change or symptoms of elevated blood pressure, including swelling of the feet, chest pain, shortness of breath, unusual headaches or vision changes.  The patient has been monitoring their blood pressure at home, typically in the morning after waking up and eating. They report that their readings have been normal, but they were unable to provide specific numbers during the visit. They also note that they had to rush from work to the appointment, which may have affected their blood pressure reading at the office.  In terms of lifestyle, the patient is trying to maintain a healthy diet and avoid sodium and fatty fried foods. They also mention having coffee before leaving work, which may have contributed to their elevated blood pressure.            ROS All review of systems negative except what is listed in the HPI    Objective:     BP (!) 139/91   Pulse 92   Ht 5\' 5"  (1.651 m)   Wt 190 lb (86.2 kg)   LMP 01/27/2023 (Exact Date) Comment: UCG deferred per PA Allred  SpO2 100%   BMI 31.62 kg/m    Physical Exam Vitals reviewed.  Constitutional:      Appearance: Normal appearance.  Cardiovascular:     Rate and Rhythm: Normal rate and regular rhythm.     Pulses: Normal pulses.     Heart sounds: Murmur heard.  Pulmonary:     Effort: Pulmonary effort is normal.     Breath sounds: Normal breath sounds.  Skin:    General: Skin is  warm and dry.  Neurological:     Mental Status: She is alert and oriented to person, place, and time.  Psychiatric:        Mood and Affect: Mood normal.        Behavior: Behavior normal.        Thought Content: Thought content normal.        Judgment: Judgment normal.      No results found for any visits on 02/11/23.    The 10-year ASCVD risk score (Arnett DK, et al., 2019) is: 3.2%    Assessment & Plan:   Problem List Items Addressed This Visit     High blood pressure - Primary    Blood pressure is not at goal for age and co-morbidities.   Recommendations: continue amlodipine 10 mg daily, adding HCTZ 12.5 mg daily; follow-up in 2-3 weeks for recheck - BP goal <130/80 - monitor and log blood pressures at home - check around the same time each day in a relaxed setting - Limit salt to <2000 mg/day - Follow DASH eating plan (heart healthy diet) - limit alcohol to 2 standard drinks per day for men and 1 per day for women - avoid tobacco products - get at least 2 hours of regular aerobic exercise weekly Patient aware of signs/symptoms requiring further/urgent evaluation.  Relevant Medications   hydrochlorothiazide (HYDRODIURIL) 12.5 MG tablet    Return in about 2 weeks (around 02/25/2023) for HTN follow-up.    Clayborne Dana, NP

## 2023-02-12 ENCOUNTER — Inpatient Hospital Stay: Payer: No Typology Code available for payment source | Admitting: Hematology & Oncology

## 2023-02-12 ENCOUNTER — Inpatient Hospital Stay: Payer: No Typology Code available for payment source | Attending: Hematology & Oncology

## 2023-02-12 ENCOUNTER — Encounter: Payer: Self-pay | Admitting: Hematology & Oncology

## 2023-02-12 ENCOUNTER — Encounter: Payer: Self-pay | Admitting: *Deleted

## 2023-02-12 VITALS — BP 168/93 | HR 108 | Temp 98.4°F | Resp 21 | Ht 65.0 in | Wt 188.1 lb

## 2023-02-12 DIAGNOSIS — C50912 Malignant neoplasm of unspecified site of left female breast: Secondary | ICD-10-CM

## 2023-02-12 DIAGNOSIS — Z171 Estrogen receptor negative status [ER-]: Secondary | ICD-10-CM | POA: Insufficient documentation

## 2023-02-12 DIAGNOSIS — Z79899 Other long term (current) drug therapy: Secondary | ICD-10-CM | POA: Insufficient documentation

## 2023-02-12 DIAGNOSIS — Z5111 Encounter for antineoplastic chemotherapy: Secondary | ICD-10-CM | POA: Insufficient documentation

## 2023-02-12 DIAGNOSIS — Z21 Asymptomatic human immunodeficiency virus [HIV] infection status: Secondary | ICD-10-CM | POA: Diagnosis present

## 2023-02-12 DIAGNOSIS — C773 Secondary and unspecified malignant neoplasm of axilla and upper limb lymph nodes: Secondary | ICD-10-CM | POA: Diagnosis not present

## 2023-02-12 HISTORY — DX: Malignant neoplasm of unspecified site of left female breast: C50.912

## 2023-02-12 LAB — CBC WITH DIFFERENTIAL (CANCER CENTER ONLY)
Abs Immature Granulocytes: 0.1 10*3/uL — ABNORMAL HIGH (ref 0.00–0.07)
Basophils Absolute: 0 10*3/uL (ref 0.0–0.1)
Basophils Relative: 0 %
Eosinophils Absolute: 0 10*3/uL (ref 0.0–0.5)
Eosinophils Relative: 0 %
HCT: 40 % (ref 36.0–46.0)
Hemoglobin: 12 g/dL (ref 12.0–15.0)
Immature Granulocytes: 1 %
Lymphocytes Relative: 29 %
Lymphs Abs: 5.3 10*3/uL — ABNORMAL HIGH (ref 0.7–4.0)
MCH: 22.9 pg — ABNORMAL LOW (ref 26.0–34.0)
MCHC: 30 g/dL (ref 30.0–36.0)
MCV: 76.2 fL — ABNORMAL LOW (ref 80.0–100.0)
Monocytes Absolute: 1.2 10*3/uL — ABNORMAL HIGH (ref 0.1–1.0)
Monocytes Relative: 6 %
Neutro Abs: 11.9 10*3/uL — ABNORMAL HIGH (ref 1.7–7.7)
Neutrophils Relative %: 64 %
Platelet Count: 227 10*3/uL (ref 150–400)
RBC: 5.25 MIL/uL — ABNORMAL HIGH (ref 3.87–5.11)
RDW: 17.3 % — ABNORMAL HIGH (ref 11.5–15.5)
Smear Review: NORMAL
WBC Count: 18.6 10*3/uL — ABNORMAL HIGH (ref 4.0–10.5)
nRBC: 0 % (ref 0.0–0.2)

## 2023-02-12 LAB — CMP (CANCER CENTER ONLY)
ALT: 20 U/L (ref 0–44)
AST: 12 U/L — ABNORMAL LOW (ref 15–41)
Albumin: 4 g/dL (ref 3.5–5.0)
Alkaline Phosphatase: 121 U/L (ref 38–126)
Anion gap: 11 (ref 5–15)
BUN: 13 mg/dL (ref 6–20)
CO2: 24 mmol/L (ref 22–32)
Calcium: 10.2 mg/dL (ref 8.9–10.3)
Chloride: 103 mmol/L (ref 98–111)
Creatinine: 0.69 mg/dL (ref 0.44–1.00)
GFR, Estimated: >60 mL/min (ref >60)
Glucose, Bld: 117 mg/dL — ABNORMAL HIGH (ref 70–99)
Potassium: 3.2 mmol/L — ABNORMAL LOW (ref 3.5–5.1)
Sodium: 138 mmol/L (ref 135–145)
Total Bilirubin: 0.3 mg/dL (ref 0.3–1.2)
Total Protein: 7.8 g/dL (ref 6.5–8.1)

## 2023-02-12 LAB — LACTATE DEHYDROGENASE: LDH: 307 U/L — ABNORMAL HIGH (ref 98–192)

## 2023-02-12 NOTE — Progress Notes (Signed)
Has not gotten Hydrochlothiazide from pharmacy yet. Instructed to take BP readings daily and contact PCP if it remains elevated, verbalized understanding.

## 2023-02-12 NOTE — Progress Notes (Signed)
Hematology and Oncology Follow Up Visit  Briana [name] [id number] 03-May-1976 46 y.o. 02/12/2023   Principle Diagnosis:  Stage IIIC (T4N3M0) TRIPLE NEGATIVE infiltrating ductal carcinoma of the left breast  Current Therapy:   Neoadjuvant chemoimmunotherapy -carboplatin/Taxol/pembrolizumab --Adriamycin/Cytoxan/pembrolizumab --start on 02/19/2023     Interim History:  [name] is back for follow-up.  This is her second office visit.  We done our workup on her.  She is TRIPLE Negative.  I really hate this for her.  This is clearly aggressive disease.  She had an MRI of the breast.  This was done on 01/30/2023.  The MRI showed the large mass in the left breast measuring 4.7 x 4 cm.  Also noted was an additional 7 mm area..  She had a PET scan that was also done.  This showed the hypermetabolic mass in the left breast.  She had hypermetabolic axillary lymph node, left intramammary lymph node.  There is also subpectoral and left supraclavicular lymph nodes.  There is no evidence of metastatic disease in the chest otherwise or abdomen and pelvis.  Clearly, she is going to need to have some genetic testing done.  She has had a Port-A-Cath placed.  She had an echocardiogram that was done on 02/10/2023.  This showed ejection fraction of 65-70%.  I think whether point we will going to have to do neoadjuvant chemotherapy.  I think given that she has triple negative disease, she would benefit from chemotherapy and immunotherapy.    I had a long talk with she and I think her mom about the protocol.  Again this is a very aggressive tumor.  We really need to try to be aggressive and treat this so that she can have surgery.  I am sure she will need a mastectomy.  I think that the best protocol would be to use 4 cycles of chemotherapy immunotherapy with pembrolizumab/carboplatinum/Taxol.  After that, then I would consider pembrolizumab/Adriamycin/Cytoxan.  She is in good shape.  I do that she could not  tolerate treatment.  She is triple negative.  She is premenopausal.  I do not think we have to make her post menopausal.,  Sure this would provide any benefit.  I went over side effects with her.  Clearly, the biggest side effect for me is going to be immunosuppression.  I know she does have HIV.  However, this seems to be under very good control.  I really do not think that our protocol will affect this.  We will have to make sure that she receives having GM-CSF.  She will lose her hair.  We will have to be careful with diarrhea.  Hopefully, neuropathy will not be too bad of a problem.  I would like to get started next week.  Currently, I would say performance status is ECOG 0.    Medications:  Current Outpatient Medications:    amLODipine (NORVASC) 10 MG tablet, Take 1 tablet (10 mg total) by mouth daily., Disp: 90 tablet, Rfl: 1   bictegravir-emtricitabine-tenofovir AF (BIKTARVY) 50-200-25 MG TABS tablet, TAKE 1 TABLET DAILY, Disp: 30 tablet, Rfl: 11   ferrous sulfate 325 (65 FE) MG EC tablet, Take 325 mg by mouth daily with breakfast., Disp: , Rfl:    predniSONE (DELTASONE) 20 MG tablet, Take 20 mg by mouth daily with breakfast., Disp: , Rfl:    triamcinolone (KENALOG) 0.025 % ointment, APPLY TOPICALLY TWICE DAILY AS NEEDED, Disp: 454 g, Rfl: 1   hydrochlorothiazide (HYDRODIURIL) 12.5 MG tablet, Take 1 tablet (  12.5 mg total) by mouth daily. (Patient not taking: Reported on 02/12/2023), Disp: 90 tablet, Rfl: 3  Allergies:  Allergies  Allergen Reactions   Pumpkin Flavor Swelling    ONLY Lip swelling after eating pumpkin pie    Past Medical History, Surgical history, Social history, and Family History were reviewed and updated.  Review of Systems: Review of Systems  Constitutional: Negative.   HENT:  Negative.    Eyes: Negative.   Respiratory: Negative.    Cardiovascular: Negative.   Gastrointestinal: Negative.   Endocrine: Negative.   Genitourinary: Negative.     Musculoskeletal: Negative.   Skin: Negative.   Neurological: Negative.   Hematological: Negative.   Psychiatric/Behavioral: Negative.      Physical Exam:  height is 5\' 5"  (1.651 m) and weight is 188 lb 1.3 oz (85.3 kg). Her oral temperature is 98.4 F (36.9 C). Her blood pressure is 168/93 (abnormal) and her pulse is 108 (abnormal). Her respiration is 21 (abnormal) and oxygen saturation is 100%.   Wt Readings from Last 3 Encounters:  02/12/23 188 lb 1.3 oz (85.3 kg)  02/11/23 190 lb (86.2 kg)  02/06/23 187 lb (84.8 kg)    Physical Exam Vitals reviewed.  Constitutional:      Comments: Right breast shows no masses edema or erythema.  There is no right axillary adenopathy.  Left breast shows a mass at the 2 o'clock position.  This probably measures about 6 cm.  It is firm and nontender.  It is not mobile.  There is a little bit of fullness in the left axilla.  HENT:     Head: Normocephalic and atraumatic.  Eyes:     Pupils: Pupils are equal, round, and reactive to light.  Cardiovascular:     Rate and Rhythm: Normal rate and regular rhythm.     Heart sounds: Normal heart sounds.  Pulmonary:     Effort: Pulmonary effort is normal.     Breath sounds: Normal breath sounds.  Abdominal:     General: Bowel sounds are normal.     Palpations: Abdomen is soft.  Musculoskeletal:        General: No tenderness or deformity. Normal range of motion.     Cervical back: Normal range of motion.  Lymphadenopathy:     Cervical: No cervical adenopathy.  Skin:    General: Skin is warm and dry.     Findings: No erythema or rash.  Neurological:     Mental Status: She is alert and oriented to person, place, and time.  Psychiatric:        Behavior: Behavior normal.        Thought Content: Thought content normal.        Judgment: Judgment normal.      Lab Results  Component Value Date   WBC 18.6 (H) 02/12/2023   HGB 12.0 02/12/2023   HCT 40.0 02/12/2023   MCV 76.2 (L) 02/12/2023   PLT  227 02/12/2023     Chemistry      Component Value Date/Time   NA 138 02/12/2023 1051   K 3.2 (L) 02/12/2023 1051   CL 103 02/12/2023 1051   CO2 24 02/12/2023 1051   BUN 13 02/12/2023 1051   CREATININE 0.69 02/12/2023 1051   CREATININE 0.62 10/17/2022 0947      Component Value Date/Time   CALCIUM 10.2 02/12/2023 1051   ALKPHOS 121 02/12/2023 1051   AST 12 (L) 02/12/2023 1051   ALT 20 02/12/2023 1051   BILITOT 0.3  02/12/2023 1051     Impression and Plan: Ms. Trimarco is a very nice 47 year old premenopausal Afro-American female with TRIPLE NEGATIVE breast cancer.  She has aggressive disease in my opinion.  We will go ahead with neoadjuvant therapy.  We will use a combination of chemotherapy and immunotherapy.  I think this would be very reasonable.  Goal is still to cure her.  Our goal is to resect this out.  I am sure this will take a mastectomy.  We will try to get started next week.  After the first 4 cycles of treatment, then we will repeat her PET scan and MRI and hopefully will see as she is responding.  I would think that we should be able to see that she is responding is by physical exam.  She is in great shape.  As such, we really can be aggressive.  I will plan to see her back myself when she starts her second cycle of treatment.   Josph Macho, MD 6/13/20243:11 PM

## 2023-02-12 NOTE — Progress Notes (Signed)
Plan to start treatment next week. Patient scheduled to start 02/20/23. She will need chemo education.   Called and spoke with patient. She is scheduled for 02/17/2023.  Oncology Nurse Navigator Documentation     02/12/2023    3:00 PM  Oncology Nurse Navigator Flowsheets  Navigator Follow Up Date: 02/20/2023  Navigator Follow Up Reason: Chemotherapy  Navigator Location [facility]  Navigator Encounter Type Appt/Treatment Plan Review;Telephone  Telephone Asess Navigation Needs  Patient Visit Type MedOnc  Treatment Phase Pre-Tx/Tx Discussion  Barriers/Navigation Needs Coordination of Care;Education  Education Other  Interventions Coordination of Care;Education  Acuity Level 2-Minimal Needs (1-2 Barriers Identified)  Coordination of Care Appts  Education Method Verbal  Support Groups/Services Friends and Family  Time Spent with Patient 15

## 2023-02-12 NOTE — Progress Notes (Addendum)
CLINICAL TRIAL SELECTED FOR SCREENING - Breast  Trial: Shorter Anthracycline-Free Chemo Immunotherapy Adapted to Pathological Response in Early Triple Negative Breast Cancer ([facility]), A Randomized Phase III Study  **Trial eligibility and accrual should be confirmed by your research team**  Patient Characteristics: Preoperative or Nonsurgical Candidate, M0 (Clinical Staging), Up to cT4c, Any N, M0, Neoadjuvant Therapy followed by Surgery, Invasive Disease, Chemotherapy, HER2 Negative, ER Negative, Platinum Therapy Indicated and Candidate for Checkpoint Inhibitor Therapeutic Status: Preoperative or Nonsurgical Candidate, M0 (Clinical Staging) AJCC M Category: cM0 AJCC Grade: G2 ER Status: Negative (-) AJCC 8 Stage Grouping: IIIC HER2 Status: Negative (-) AJCC T Category: cT3 AJCC N Category: cN3 PR Status: Negative (-) Breast Surgical Plan: Neoadjuvant Therapy followed by Surgery Intent of Therapy: Curative Intent, Not Discussed with Patient

## 2023-02-12 NOTE — Progress Notes (Signed)
DISCONTINUE SELECTED CLINICAL TRIAL - Breast  Trial: Shorter Anthracycline-Free Chemo Immunotherapy Adapted to Pathological Response in Early Triple Negative Breast Cancer ([facility]), A Randomized Phase III Study  **Trial eligibility and accrual should be confirmed by your research team**  REASON: Other Reason PRIOR TREATMENT: Trial: Shorter Anthracycline-Free Chemo Immunotherapy Adapted to Pathological Response in Early Triple Negative Breast Cancer ([facility]), A Randomized Phase III Study TREATMENT RESPONSE: Unable to Evaluate  Breast - No Medical Intervention - Off Treatment.  Patient Characteristics: Preoperative or Nonsurgical Candidate, M0 (Clinical Staging), Up to cT4c, Any N, M0, Neoadjuvant Therapy followed by Surgery, Invasive Disease, Chemotherapy, HER2 Negative, ER Negative, Platinum Therapy Indicated and Candidate for Checkpoint Inhibitor Therapeutic Status: Preoperative or Nonsurgical Candidate, M0 (Clinical Staging) AJCC M Category: cM0 AJCC Grade: G2 ER Status: Negative (-) AJCC 8 Stage Grouping: IIIC HER2 Status: Negative (-) AJCC T Category: cT3 AJCC N Category: cN3 PR Status: Negative (-) Breast Surgical Plan: Neoadjuvant Therapy followed by Surgery

## 2023-02-12 NOTE — Progress Notes (Signed)
DISCONTINUE ON PATHWAY REGIMEN - Breast  No Medical Intervention - Off Treatment.  REASON: Other Reason PRIOR TREATMENT: Off Treatment  START ON PATHWAY REGIMEN - Breast     Cycles 1 through 4: A cycle is every 21 days:     Pembrolizumab      Paclitaxel      Carboplatin      Filgrastim-xxxx    Cycles 5 through 8: A cycle is every 21 days:     Pembrolizumab      Doxorubicin      Cyclophosphamide      Pegfilgrastim-xxxx   **Always confirm dose/schedule in your pharmacy ordering system**  Patient Characteristics: Preoperative or Nonsurgical Candidate, M0 (Clinical Staging), Up to cT4c, Any N, M0, Neoadjuvant Therapy followed by Surgery, Invasive Disease, Chemotherapy, HER2 Negative, ER Negative, Platinum Therapy Indicated and Candidate for Checkpoint Inhibitor Therapeutic Status: Preoperative or Nonsurgical Candidate, M0 (Clinical Staging) AJCC M Category: cM0 AJCC Grade: G2 ER Status: Negative (-) AJCC 8 Stage Grouping: IIIC HER2 Status: Negative (-) AJCC T Category: cT3 AJCC N Category: cN3 PR Status: Negative (-) Breast Surgical Plan: Neoadjuvant Therapy followed by Surgery Intent of Therapy: Curative Intent, Discussed with Patient

## 2023-02-13 ENCOUNTER — Other Ambulatory Visit: Payer: Self-pay

## 2023-02-13 ENCOUNTER — Other Ambulatory Visit: Payer: Self-pay | Admitting: *Deleted

## 2023-02-13 DIAGNOSIS — C50912 Malignant neoplasm of unspecified site of left female breast: Secondary | ICD-10-CM

## 2023-02-13 MED ORDER — ONDANSETRON HCL 8 MG PO TABS
8.0000 mg | ORAL_TABLET | Freq: Three times a day (TID) | ORAL | 1 refills | Status: DC | PRN
Start: 2023-02-13 — End: 2023-07-10

## 2023-02-13 MED ORDER — DEXAMETHASONE 4 MG PO TABS
ORAL_TABLET | ORAL | 1 refills | Status: AC
Start: 2023-02-13 — End: ?

## 2023-02-13 MED ORDER — PROCHLORPERAZINE MALEATE 10 MG PO TABS
10.0000 mg | ORAL_TABLET | Freq: Four times a day (QID) | ORAL | 1 refills | Status: DC | PRN
Start: 2023-02-13 — End: 2023-07-10

## 2023-02-13 NOTE — Progress Notes (Signed)
Pharmacist Chemotherapy Monitoring - Initial Assessment    Anticipated start date: 02/20/23   The following has been reviewed per standard work regarding the patient's treatment regimen: The patient's diagnosis, treatment plan and drug doses, and organ/hematologic function Lab orders and baseline tests specific to treatment regimen  The treatment plan start date, drug sequencing, and pre-medications Prior authorization status  Patient's documented medication list, including drug-drug interaction screen and prescriptions for anti-emetics and supportive care specific to the treatment regimen The drug concentrations, fluid compatibility, administration routes, and timing of the medications to be used The patient's access for treatment and lifetime cumulative dose history, if applicable  The patient's medication allergies and previous infusion related reactions, if applicable   Changes made to treatment plan:  treatment plan date  Follow up needed:  N/A   Briana [name], Nevin Bloodgood, [facility], 02/13/2023  7:42 AM

## 2023-02-17 ENCOUNTER — Encounter: Payer: Self-pay | Admitting: *Deleted

## 2023-02-17 ENCOUNTER — Inpatient Hospital Stay: Payer: No Typology Code available for payment source

## 2023-02-17 NOTE — Progress Notes (Signed)
Patient in chemotherapy education class with  her mother Karena Addison.  Discussed side effects of Keytruda, Carboplatin, Taxol which include but are not limited to myelosuppression, decreased appetite, fatigue, fever, allergic or infusional reaction, mucositis, cardiac toxicity, cough, SOB, altered taste, nausea and vomiting, diarrhea, constipation, elevated LFTs myalgia and arthralgias, hair loss or thinning, rash, skin dryness, nail changes, peripheral neuropathy, discolored urine, delayed wound healing, mental changes (Chemo brain), increased risk of infections, weight loss.  Reviewed infusion room and office policy and procedure and phone numbers 24 hours x 7 days a week.   Reviewed when to call the office with any concerns or problems.  Transport planner given.  Discussed portacath insertion and EMLA cream administration.  Antiemetic protocol and chemotherapy schedule reviewed. Patient verbalized understanding of chemotherapy indications and possible side effects.  Teachback done

## 2023-02-19 ENCOUNTER — Ambulatory Visit: Payer: No Typology Code available for payment source

## 2023-02-19 ENCOUNTER — Other Ambulatory Visit: Payer: Self-pay | Admitting: Hematology & Oncology

## 2023-02-19 ENCOUNTER — Other Ambulatory Visit: Payer: Self-pay

## 2023-02-19 DIAGNOSIS — C50912 Malignant neoplasm of unspecified site of left female breast: Secondary | ICD-10-CM

## 2023-02-19 NOTE — Progress Notes (Signed)
Francesco Sor FMLA papers completed and faxed with confirmation received.

## 2023-02-20 ENCOUNTER — Inpatient Hospital Stay: Payer: No Typology Code available for payment source

## 2023-02-20 ENCOUNTER — Other Ambulatory Visit: Payer: Self-pay

## 2023-02-20 ENCOUNTER — Encounter: Payer: Self-pay | Admitting: *Deleted

## 2023-02-20 ENCOUNTER — Other Ambulatory Visit: Payer: Self-pay | Admitting: Family

## 2023-02-20 VITALS — BP 125/78 | HR 78

## 2023-02-20 DIAGNOSIS — Z21 Asymptomatic human immunodeficiency virus [HIV] infection status: Secondary | ICD-10-CM | POA: Diagnosis not present

## 2023-02-20 DIAGNOSIS — C50912 Malignant neoplasm of unspecified site of left female breast: Secondary | ICD-10-CM

## 2023-02-20 DIAGNOSIS — D508 Other iron deficiency anemias: Secondary | ICD-10-CM

## 2023-02-20 LAB — CMP (CANCER CENTER ONLY)
ALT: 19 U/L (ref 0–44)
AST: 17 U/L (ref 15–41)
Albumin: 3.8 g/dL (ref 3.5–5.0)
Alkaline Phosphatase: 109 U/L (ref 38–126)
Anion gap: 9 (ref 5–15)
BUN: 15 mg/dL (ref 6–20)
CO2: 23 mmol/L (ref 22–32)
Calcium: 9.3 mg/dL (ref 8.9–10.3)
Chloride: 101 mmol/L (ref 98–111)
Creatinine: 0.75 mg/dL (ref 0.44–1.00)
GFR, Estimated: >60 mL/min (ref >60)
Glucose, Bld: 159 mg/dL — ABNORMAL HIGH (ref 70–99)
Potassium: 3.2 mmol/L — ABNORMAL LOW (ref 3.5–5.1)
Sodium: 133 mmol/L — ABNORMAL LOW (ref 135–145)
Total Bilirubin: 0.4 mg/dL (ref 0.3–1.2)
Total Protein: 7.3 g/dL (ref 6.5–8.1)

## 2023-02-20 LAB — CBC WITH DIFFERENTIAL (CANCER CENTER ONLY)
Abs Immature Granulocytes: 0.07 10*3/uL (ref 0.00–0.07)
Basophils Absolute: 0.1 10*3/uL (ref 0.0–0.1)
Basophils Relative: 0 %
Eosinophils Absolute: 0.3 10*3/uL (ref 0.0–0.5)
Eosinophils Relative: 2 %
HCT: 36.2 % (ref 36.0–46.0)
Hemoglobin: 11.1 g/dL — ABNORMAL LOW (ref 12.0–15.0)
Immature Granulocytes: 1 %
Lymphocytes Relative: 24 %
Lymphs Abs: 3.4 10*3/uL (ref 0.7–4.0)
MCH: 23.1 pg — ABNORMAL LOW (ref 26.0–34.0)
MCHC: 30.7 g/dL (ref 30.0–36.0)
MCV: 75.4 fL — ABNORMAL LOW (ref 80.0–100.0)
Monocytes Absolute: 0.8 10*3/uL (ref 0.1–1.0)
Monocytes Relative: 5 %
Neutro Abs: 9.8 10*3/uL — ABNORMAL HIGH (ref 1.7–7.7)
Neutrophils Relative %: 68 %
Platelet Count: 200 10*3/uL (ref 150–400)
RBC: 4.8 MIL/uL (ref 3.87–5.11)
RDW: 16.9 % — ABNORMAL HIGH (ref 11.5–15.5)
WBC Count: 14.4 10*3/uL — ABNORMAL HIGH (ref 4.0–10.5)
nRBC: 0 % (ref 0.0–0.2)

## 2023-02-20 LAB — PREGNANCY, URINE: Preg Test, Ur: NEGATIVE

## 2023-02-20 LAB — TSH: TSH: <0.01 u[IU]/mL — ABNORMAL LOW (ref 0.350–4.500)

## 2023-02-20 MED ORDER — FAMOTIDINE IN NACL 20-0.9 MG/50ML-% IV SOLN
20.0000 mg | Freq: Once | INTRAVENOUS | Status: AC
  Administered 2023-02-20: 20 mg via INTRAVENOUS
  Filled 2023-02-20: qty 50

## 2023-02-20 MED ORDER — SODIUM CHLORIDE 0.9 % IV SOLN
150.0000 mg | Freq: Once | INTRAVENOUS | Status: AC
  Administered 2023-02-20: 150 mg via INTRAVENOUS
  Filled 2023-02-20: qty 150

## 2023-02-20 MED ORDER — SODIUM CHLORIDE 0.9 % IV SOLN
80.0000 mg/m2 | Freq: Once | INTRAVENOUS | Status: AC
  Administered 2023-02-20: 156 mg via INTRAVENOUS
  Filled 2023-02-20: qty 26

## 2023-02-20 MED ORDER — LIDOCAINE-PRILOCAINE 2.5-2.5 % EX CREA
1.0000 | TOPICAL_CREAM | CUTANEOUS | 0 refills | Status: AC | PRN
Start: 2023-02-20 — End: ?

## 2023-02-20 MED ORDER — HEPARIN SOD (PORK) LOCK FLUSH 100 UNIT/ML IV SOLN
500.0000 [IU] | Freq: Once | INTRAVENOUS | Status: AC | PRN
  Administered 2023-02-20: 500 [IU]

## 2023-02-20 MED ORDER — SODIUM CHLORIDE 0.9 % IV SOLN
510.0000 mg | Freq: Once | INTRAVENOUS | Status: AC
  Administered 2023-02-20: 510 mg via INTRAVENOUS
  Filled 2023-02-20: qty 17

## 2023-02-20 MED ORDER — SODIUM CHLORIDE 0.9 % IV SOLN
729.0000 mg | Freq: Once | INTRAVENOUS | Status: AC
  Administered 2023-02-20: 730 mg via INTRAVENOUS
  Filled 2023-02-20: qty 73

## 2023-02-20 MED ORDER — SODIUM CHLORIDE 0.9 % IV SOLN: Freq: Once | INTRAVENOUS | Status: AC

## 2023-02-20 MED ORDER — SODIUM CHLORIDE 0.9 % IV SOLN
200.0000 mg | Freq: Once | INTRAVENOUS | Status: AC
  Administered 2023-02-20: 200 mg via INTRAVENOUS
  Filled 2023-02-20: qty 8

## 2023-02-20 MED ORDER — PALONOSETRON HCL INJECTION 0.25 MG/5ML
0.2500 mg | Freq: Once | INTRAVENOUS | Status: AC
  Administered 2023-02-20: 0.25 mg via INTRAVENOUS
  Filled 2023-02-20: qty 5

## 2023-02-20 MED ORDER — SODIUM CHLORIDE 0.9 % IV SOLN
10.0000 mg | Freq: Once | INTRAVENOUS | Status: AC
  Administered 2023-02-20: 10 mg via INTRAVENOUS
  Filled 2023-02-20: qty 10

## 2023-02-20 MED ORDER — DIPHENHYDRAMINE HCL 50 MG/ML IJ SOLN
50.0000 mg | Freq: Once | INTRAMUSCULAR | Status: AC
  Administered 2023-02-20: 50 mg via INTRAVENOUS
  Filled 2023-02-20: qty 1

## 2023-02-20 MED ORDER — SODIUM CHLORIDE 0.9% FLUSH
10.0000 mL | INTRAVENOUS | Status: DC | PRN
  Administered 2023-02-20: 10 mL

## 2023-02-20 NOTE — Patient Instructions (Signed)
[facility]  Discharge Instructions: Thank you for choosing [facility] to provide your oncology and hematology care.   If you have a lab appointment with the Cancer Center, please go directly to the Cancer Center and check in at the registration area.  Wear comfortable clothing and clothing appropriate for easy access to any Portacath or PICC line.   We strive to give you quality time with your provider. You may need to reschedule your appointment if you arrive late (15 or more minutes).  Arriving late affects you and other patients whose appointments are after yours.  Also, if you miss three or more appointments without notifying the office, you may be dismissed from the clinic at the provider's discretion.      For prescription refill requests, have your pharmacy contact our office and allow 72 hours for refills to be completed.    Today you received the following chemotherapy and/or immunotherapy agents Taxol, Carbo, Keytruda, Feraheme      To help prevent nausea and vomiting after your treatment, we encourage you to take your nausea medication as directed.  BELOW ARE SYMPTOMS THAT SHOULD BE REPORTED IMMEDIATELY: *FEVER GREATER THAN 100.4 F (38 C) OR HIGHER *CHILLS OR SWEATING *NAUSEA AND VOMITING THAT IS NOT CONTROLLED WITH YOUR NAUSEA MEDICATION *UNUSUAL SHORTNESS OF BREATH *UNUSUAL BRUISING OR BLEEDING *URINARY PROBLEMS (pain or burning when urinating, or frequent urination) *BOWEL PROBLEMS (unusual diarrhea, constipation, pain near the anus) TENDERNESS IN MOUTH AND THROAT WITH OR WITHOUT PRESENCE OF ULCERS (sore throat, sores in mouth, or a toothache) UNUSUAL RASH, SWELLING OR PAIN  UNUSUAL VAGINAL DISCHARGE OR ITCHING   Items with * indicate a potential emergency and should be followed up as soon as possible or go to the Emergency Department if any problems should occur.  Please show the CHEMOTHERAPY ALERT CARD or  IMMUNOTHERAPY ALERT CARD at check-in to the Emergency Department and triage nurse. Should you have questions after your visit or need to cancel or reschedule your appointment, please contact [facility]  [phone] and follow the prompts.  Office hours are 8:00 a.m. to 4:30 p.m. Monday - Friday. Please note that voicemails left after 4:00 p.m. may not be returned until the following business day.  We are closed weekends and major holidays. You have access to a nurse at all times for urgent questions. Please call the main number to the clinic [phone] and follow the prompts.  For any non-urgent questions, you may also contact your provider using MyChart. We now offer e-Visits for anyone 54 and older to request care online for non-urgent symptoms. For details visit [email or web address].   Also download the MyChart app! Go to the app store, search "MyChart", open the app, select [facility], and log in with your MyChart username and password.

## 2023-02-20 NOTE — Progress Notes (Signed)
Patient is here to begin treatment. She is ready to proceed. She had chemo education last week.  Patient qualifies for genetic testing. Reviewed genetic testing and benefits with patient. Genetic Flyer given to patient. She will think about this and let me know if she'd like to proceed or not.   Oncology Nurse Navigator Documentation     02/20/2023   10:00 AM  Oncology Nurse Navigator Flowsheets  Planned Course of Treatment Chemotherapy  Phase of Treatment Chemo  Chemotherapy Actual Start Date: 02/20/2023  Chemotherapy Expected End Date: 07/16/2023  Navigator Follow Up Date: 03/12/2023  Navigator Follow Up Reason: Follow-up Appointment;Chemotherapy  Navigator Location [facility]  Navigator Encounter Type Treatment  Treatment Initiated Date 02/20/2023  Patient Visit Type MedOnc  Treatment Phase First Chemo Tx  Barriers/Navigation Needs Coordination of Care;Education  Education Other  Interventions Education;Psycho-Social Support  Acuity Level 2-Minimal Needs (1-2 Barriers Identified)  Education Method Verbal;Written  Support Groups/Services Friends and Family  Time Spent with Patient 30

## 2023-02-20 NOTE — Patient Instructions (Signed)

## 2023-02-22 LAB — T4: T4, Total: 9 ug/dL (ref 4.5–12.0)

## 2023-02-25 ENCOUNTER — Ambulatory Visit: Payer: No Typology Code available for payment source | Admitting: Family Medicine

## 2023-02-26 ENCOUNTER — Inpatient Hospital Stay: Payer: No Typology Code available for payment source

## 2023-02-26 DIAGNOSIS — Z21 Asymptomatic human immunodeficiency virus [HIV] infection status: Secondary | ICD-10-CM | POA: Diagnosis not present

## 2023-02-26 DIAGNOSIS — C50912 Malignant neoplasm of unspecified site of left female breast: Secondary | ICD-10-CM

## 2023-02-26 LAB — CMP (CANCER CENTER ONLY)
ALT: 21 U/L (ref 0–44)
AST: 17 U/L (ref 15–41)
Albumin: 3.8 g/dL (ref 3.5–5.0)
Alkaline Phosphatase: 119 U/L (ref 38–126)
Anion gap: 10 (ref 5–15)
BUN: 22 mg/dL — ABNORMAL HIGH (ref 6–20)
CO2: 23 mmol/L (ref 22–32)
Calcium: 9.4 mg/dL (ref 8.9–10.3)
Chloride: 99 mmol/L (ref 98–111)
Creatinine: 0.78 mg/dL (ref 0.44–1.00)
GFR, Estimated: >60 mL/min (ref >60)
Glucose, Bld: 192 mg/dL — ABNORMAL HIGH (ref 70–99)
Potassium: 3 mmol/L — ABNORMAL LOW (ref 3.5–5.1)
Sodium: 132 mmol/L — ABNORMAL LOW (ref 135–145)
Total Bilirubin: 0.2 mg/dL — ABNORMAL LOW (ref 0.3–1.2)
Total Protein: 7.1 g/dL (ref 6.5–8.1)

## 2023-02-26 LAB — CBC WITH DIFFERENTIAL (CANCER CENTER ONLY)
Abs Immature Granulocytes: 0.07 10*3/uL (ref 0.00–0.07)
Basophils Absolute: 0 10*3/uL (ref 0.0–0.1)
Basophils Relative: 0 %
Eosinophils Absolute: 0.2 10*3/uL (ref 0.0–0.5)
Eosinophils Relative: 2 %
HCT: 39.4 % (ref 36.0–46.0)
Hemoglobin: 12.2 g/dL (ref 12.0–15.0)
Immature Granulocytes: 1 %
Lymphocytes Relative: 29 %
Lymphs Abs: 3.3 10*3/uL (ref 0.7–4.0)
MCH: 23.5 pg — ABNORMAL LOW (ref 26.0–34.0)
MCHC: 31 g/dL (ref 30.0–36.0)
MCV: 75.8 fL — ABNORMAL LOW (ref 80.0–100.0)
Monocytes Absolute: 0.3 10*3/uL (ref 0.1–1.0)
Monocytes Relative: 3 %
Neutro Abs: 7.3 10*3/uL (ref 1.7–7.7)
Neutrophils Relative %: 65 %
Platelet Count: 291 10*3/uL (ref 150–400)
RBC: 5.2 MIL/uL — ABNORMAL HIGH (ref 3.87–5.11)
RDW: 16.4 % — ABNORMAL HIGH (ref 11.5–15.5)
WBC Count: 11.2 10*3/uL — ABNORMAL HIGH (ref 4.0–10.5)
nRBC: 0 % (ref 0.0–0.2)

## 2023-02-26 MED ORDER — SODIUM CHLORIDE 0.9 % IV SOLN: Freq: Once | INTRAVENOUS | Status: AC

## 2023-02-26 MED ORDER — HEPARIN SOD (PORK) LOCK FLUSH 100 UNIT/ML IV SOLN: 500.0000 [IU] | Freq: Once | INTRAVENOUS | Status: DC | PRN

## 2023-02-26 MED ORDER — FAMOTIDINE IN NACL 20-0.9 MG/50ML-% IV SOLN
20.0000 mg | Freq: Once | INTRAVENOUS | Status: AC
  Administered 2023-02-26: 20 mg via INTRAVENOUS
  Filled 2023-02-26: qty 50

## 2023-02-26 MED ORDER — DIPHENHYDRAMINE HCL 50 MG/ML IJ SOLN
50.0000 mg | Freq: Once | INTRAMUSCULAR | Status: AC
  Administered 2023-02-26: 50 mg via INTRAVENOUS
  Filled 2023-02-26: qty 1

## 2023-02-26 MED ORDER — SODIUM CHLORIDE 0.9 % IV SOLN
10.0000 mg | Freq: Once | INTRAVENOUS | Status: AC
  Administered 2023-02-26: 10 mg via INTRAVENOUS
  Filled 2023-02-26: qty 10

## 2023-02-26 MED ORDER — SODIUM CHLORIDE 0.9 % IV SOLN
80.0000 mg/m2 | Freq: Once | INTRAVENOUS | Status: AC
  Administered 2023-02-26: 156 mg via INTRAVENOUS
  Filled 2023-02-26: qty 26

## 2023-02-26 MED ORDER — SODIUM CHLORIDE 0.9% FLUSH: 10.0000 mL | INTRAVENOUS | Status: DC | PRN

## 2023-02-26 NOTE — Addendum Note (Signed)
Encounter addended by: Satira Anis, RN on: 02/26/2023 1:06 PM  Actions taken: Charge Capture section accepted

## 2023-02-27 ENCOUNTER — Ambulatory Visit (INDEPENDENT_AMBULATORY_CARE_PROVIDER_SITE_OTHER): Payer: No Typology Code available for payment source | Admitting: Family Medicine

## 2023-02-27 ENCOUNTER — Encounter: Payer: Self-pay | Admitting: Family Medicine

## 2023-02-27 VITALS — BP 126/81 | HR 90 | Ht 65.0 in | Wt 184.0 lb

## 2023-02-27 DIAGNOSIS — C773 Secondary and unspecified malignant neoplasm of axilla and upper limb lymph nodes: Secondary | ICD-10-CM | POA: Diagnosis not present

## 2023-02-27 DIAGNOSIS — I10 Essential (primary) hypertension: Secondary | ICD-10-CM | POA: Diagnosis not present

## 2023-02-27 DIAGNOSIS — C50912 Malignant neoplasm of unspecified site of left female breast: Secondary | ICD-10-CM

## 2023-02-27 NOTE — Progress Notes (Signed)
   Established Patient Office Visit  Subjective   Patient ID: Briana [name], female    DOB: 1975-12-20  Age: 47 y.o. MRN: [id number]  Chief Complaint  Patient presents with   Hypertension     Discussed the use of AI scribe software for clinical note transcription with the patient, who gave verbal consent to proceed.  History of Present Illness   The patient, with a history of hypertension and currently undergoing chemotherapy, presents for a blood pressure check. They report feeling 'pretty good' overall, but note that the first few days following chemotherapy are particularly challenging. They describe feeling as though they've 'hit a wall' on these days, but are unsure if this is due to the chemotherapy, the heat, or a combination of both. They deny any side effects or problems with their current antihypertensive medications, amlodipine 10 mg daily and hydrochlorothiazide 12.5 mg daily. They deny chest pain, shortness of breath, vision changes, and headaches.  Despite their ongoing treatment, they continue to work, although they have taken intermittent FMLA to accommodate their treatment schedule and any days when they are feeling particularly unwell.             ROS All review of systems negative except what is listed in the HPI    Objective:     BP 126/81   Pulse 90   Ht 5\' 5"  (1.651 m)   Wt 184 lb (83.5 kg)   LMP 01/27/2023 (Exact Date) Comment: UCG deferred per PA Allred  SpO2 99%   BMI 30.62 kg/m    Physical Exam Vitals reviewed.  Constitutional:      Appearance: Normal appearance.  Cardiovascular:     Rate and Rhythm: Normal rate and regular rhythm.     Pulses: Normal pulses.  Pulmonary:     Effort: Pulmonary effort is normal.     Breath sounds: Normal breath sounds.  Skin:    General: Skin is warm and dry.  Neurological:     Mental Status: She is alert and oriented to person, place, and time.  Psychiatric:        Mood and Affect: Mood normal.         Behavior: Behavior normal.        Thought Content: Thought content normal.        Judgment: Judgment normal.      No results found for any visits on 02/27/23.    The 10-year ASCVD risk score (Arnett DK, et al., 2019) is: 2%    Assessment & Plan:   Problem List Items Addressed This Visit     HTN (hypertension) - Primary    Hypertension: Initial high reading in office, but normalized after rest. Patient is currently on Amlodipine 10mg  daily and Hydrochlorothiazide 12.5mg  daily with no reported side effects. -Continue current medications. -Advise patient to monitor blood pressure at home and report if consistently elevated.      Breast cancer metastasized to axillary lymph node, left (HCC) (Chronic)    Cancer (type unspecified): Patient is currently undergoing chemotherapy with reported fatigue and weakness post-treatment. -Continue with current oncology treatment plan. -Encourage rest and self-care during treatment period. Patient is currently working while undergoing chemotherapy. Has intermittent FMLA in place. -Encourage use of FMLA as needed for rest and recovery. -Offer support with any necessary work-related documentation.             Return in about 3 months (around 05/30/2023) for routine follow-up.    Briana Dana, NP

## 2023-02-27 NOTE — Assessment & Plan Note (Signed)
Hypertension: Initial high reading in office, but normalized after rest. Patient is currently on Amlodipine 10mg  daily and Hydrochlorothiazide 12.5mg  daily with no reported side effects. -Continue current medications. -Advise patient to monitor blood pressure at home and report if consistently elevated.

## 2023-02-27 NOTE — Assessment & Plan Note (Signed)
Cancer (type unspecified): Patient is currently undergoing chemotherapy with reported fatigue and weakness post-treatment. -Continue with current oncology treatment plan. -Encourage rest and self-care during treatment period. Patient is currently working while undergoing chemotherapy. Has intermittent FMLA in place. -Encourage use of FMLA as needed for rest and recovery. -Offer support with any necessary work-related documentation.

## 2023-03-03 ENCOUNTER — Telehealth: Payer: Self-pay

## 2023-03-03 ENCOUNTER — Other Ambulatory Visit: Payer: Self-pay

## 2023-03-03 DIAGNOSIS — C50912 Malignant neoplasm of unspecified site of left female breast: Secondary | ICD-10-CM

## 2023-03-03 MED ORDER — TRAMADOL HCL 50 MG PO TABS
50.0000 mg | ORAL_TABLET | Freq: Four times a day (QID) | ORAL | 0 refills | Status: DC | PRN
Start: 2023-03-03 — End: 2023-07-10

## 2023-03-03 NOTE — Telephone Encounter (Signed)
Received call from pt reporting bilateral knee pain. She describes the pain to be achy in nature and radiates up and down both legs. Pain started this am. Pt took Tylenol 650mg  without relief about an hour ago. Denies injury. Discussed with Dr Myna Hidalgo who will order Tramadol for pt. Pt aware and will contact office if pain does not improve with treatment. dph

## 2023-03-06 ENCOUNTER — Inpatient Hospital Stay: Payer: No Typology Code available for payment source

## 2023-03-06 ENCOUNTER — Inpatient Hospital Stay: Payer: No Typology Code available for payment source | Attending: Hematology & Oncology

## 2023-03-06 ENCOUNTER — Encounter: Payer: Self-pay | Admitting: Hematology & Oncology

## 2023-03-06 ENCOUNTER — Inpatient Hospital Stay (HOSPITAL_BASED_OUTPATIENT_CLINIC_OR_DEPARTMENT_OTHER): Payer: No Typology Code available for payment source | Admitting: Hematology & Oncology

## 2023-03-06 VITALS — HR 131 | Temp 101.3°F

## 2023-03-06 DIAGNOSIS — Z5111 Encounter for antineoplastic chemotherapy: Secondary | ICD-10-CM | POA: Insufficient documentation

## 2023-03-06 DIAGNOSIS — Z171 Estrogen receptor negative status [ER-]: Secondary | ICD-10-CM | POA: Insufficient documentation

## 2023-03-06 DIAGNOSIS — E876 Hypokalemia: Secondary | ICD-10-CM | POA: Diagnosis not present

## 2023-03-06 DIAGNOSIS — R5081 Fever presenting with conditions classified elsewhere: Secondary | ICD-10-CM | POA: Diagnosis not present

## 2023-03-06 DIAGNOSIS — Z21 Asymptomatic human immunodeficiency virus [HIV] infection status: Secondary | ICD-10-CM | POA: Insufficient documentation

## 2023-03-06 DIAGNOSIS — C50912 Malignant neoplasm of unspecified site of left female breast: Secondary | ICD-10-CM

## 2023-03-06 DIAGNOSIS — Z79899 Other long term (current) drug therapy: Secondary | ICD-10-CM | POA: Insufficient documentation

## 2023-03-06 LAB — CBC WITH DIFFERENTIAL (CANCER CENTER ONLY)
Abs Immature Granulocytes: 0.11 10*3/uL — ABNORMAL HIGH (ref 0.00–0.07)
Basophils Absolute: 0 10*3/uL (ref 0.0–0.1)
Basophils Relative: 0 %
Eosinophils Absolute: 0 10*3/uL (ref 0.0–0.5)
Eosinophils Relative: 1 %
HCT: 38.9 % (ref 36.0–46.0)
Hemoglobin: 12.4 g/dL (ref 12.0–15.0)
Immature Granulocytes: 1 %
Lymphocytes Relative: 24 %
Lymphs Abs: 1.8 10*3/uL (ref 0.7–4.0)
MCH: 23.8 pg — ABNORMAL LOW (ref 26.0–34.0)
MCHC: 31.9 g/dL (ref 30.0–36.0)
MCV: 74.7 fL — ABNORMAL LOW (ref 80.0–100.0)
Monocytes Absolute: 0.3 10*3/uL (ref 0.1–1.0)
Monocytes Relative: 4 %
Neutro Abs: 5.3 10*3/uL (ref 1.7–7.7)
Neutrophils Relative %: 70 %
Platelet Count: 160 10*3/uL (ref 150–400)
RBC: 5.21 MIL/uL — ABNORMAL HIGH (ref 3.87–5.11)
RDW: 16.9 % — ABNORMAL HIGH (ref 11.5–15.5)
WBC Count: 7.6 10*3/uL (ref 4.0–10.5)
nRBC: 0 % (ref 0.0–0.2)

## 2023-03-06 LAB — CMP (CANCER CENTER ONLY)
ALT: 36 U/L (ref 0–44)
AST: 35 U/L (ref 15–41)
Albumin: 3.6 g/dL (ref 3.5–5.0)
Alkaline Phosphatase: 127 U/L — ABNORMAL HIGH (ref 38–126)
Anion gap: 13 (ref 5–15)
BUN: 14 mg/dL (ref 6–20)
CO2: 22 mmol/L (ref 22–32)
Calcium: 8.9 mg/dL (ref 8.9–10.3)
Chloride: 90 mmol/L — ABNORMAL LOW (ref 98–111)
Creatinine: 1.16 mg/dL — ABNORMAL HIGH (ref 0.44–1.00)
GFR, Estimated: 60 mL/min — ABNORMAL LOW (ref >60)
Glucose, Bld: 161 mg/dL — ABNORMAL HIGH (ref 70–99)
Potassium: 2.8 mmol/L — ABNORMAL LOW (ref 3.5–5.1)
Sodium: 125 mmol/L — ABNORMAL LOW (ref 135–145)
Total Bilirubin: 0.3 mg/dL (ref 0.3–1.2)
Total Protein: 7.4 g/dL (ref 6.5–8.1)

## 2023-03-06 MED ORDER — POTASSIUM CHLORIDE 10 MEQ/100ML IV SOLN
10.0000 meq | INTRAVENOUS | Status: AC
  Administered 2023-03-06 (×4): 10 meq via INTRAVENOUS
  Filled 2023-03-06: qty 100

## 2023-03-06 MED ORDER — SODIUM CHLORIDE 0.9 % IV SOLN: INTRAVENOUS | Status: DC

## 2023-03-06 NOTE — Progress Notes (Signed)
Covid test was done using Flowflex Covid-19 Antigen [employer],  Lot # [id number], Exp. 2024-05-19. Result was NEGATIVE.

## 2023-03-06 NOTE — Patient Instructions (Signed)

## 2023-03-06 NOTE — Patient Instructions (Signed)
Hypokalemia Hypokalemia means that the amount of potassium in the blood is lower than normal. Potassium is a mineral (electrolyte) that helps regulate the amount of fluid in the body. It also stimulates muscle tightening (contraction) and helps nerves work properly. Normally, most of the body's potassium is inside cells, and only a very small amount is in the blood. Because the amount in the blood is so small, minor changes to potassium levels in the blood can be life-threatening. What are the causes? This condition may be caused by: Antibiotic medicine. Diarrhea or vomiting. Taking too much of a medicine that helps you have a bowel movement (laxative) can cause diarrhea and lead to hypokalemia. Chronic kidney disease (CKD). Medicines that help the body get rid of excess fluid (diuretics). Eating disorders, such as anorexia or bulimia. Low magnesium levels in the body. Sweating a lot. What are the signs or symptoms? Symptoms of this condition include: Weakness. Constipation. Fatigue. Muscle cramps. Mental confusion. Skipped heartbeats or irregular heartbeat (palpitations). Tingling or numbness. How is this diagnosed? This condition is diagnosed with a blood test. How is this treated? This condition may be treated by: Taking potassium supplements. Adjusting the medicines that you take. Eating more foods that contain a lot of potassium. If your potassium level is very low, you may need to get potassium through an IV and be monitored in the hospital. Follow these instructions at home: Eating and drinking  Eat a healthy diet. A healthy diet includes fresh fruits and vegetables, whole grains, healthy fats, and lean proteins. If told, eat more foods that contain a lot of potassium. These include: Nuts, such as peanuts and pistachios. Seeds, such as sunflower seeds and pumpkin seeds. Peas, lentils, and lima beans. Whole grain and bran cereals and breads. Fresh fruits and vegetables,  such as apricots, avocado, bananas, cantaloupe, kiwi, oranges, tomatoes, asparagus, and potatoes. Juices, such as orange, tomato, and prune. Lean meats, including fish. Milk and milk products, such as yogurt. General instructions Take over-the-counter and prescription medicines only as told by your health care provider. This includes vitamins, natural food products, and supplements. Keep all follow-up visits. This is important. Contact a health care provider if: You have weakness that gets worse. You feel your heart pounding or racing. You vomit. You have diarrhea. You have diabetes and you have trouble keeping your blood sugar in your target range. Get help right away if: You have chest pain. You have shortness of breath. You have vomiting or diarrhea that lasts for more than 2 days. You faint. These symptoms may be an emergency. Get help right away. Call 911. Do not wait to see if the symptoms will go away. Do not drive yourself to the hospital. Summary Hypokalemia means that the amount of potassium in the blood is lower than normal. This condition is diagnosed with a blood test. Hypokalemia may be treated by taking potassium supplements, adjusting the medicines that you take, or eating more foods that are high in potassium. If your potassium level is very low, you may need to get potassium through an IV and be monitored in the hospital. This information is not intended to replace advice given to you by your health care provider. Make sure you discuss any questions you have with your health care provider. Document Revised: 05/02/2021 Document Reviewed: 05/02/2021 Elsevier Patient Education  2024 [employer] Inc.  

## 2023-03-06 NOTE — Progress Notes (Signed)
Hematology and Oncology Follow Up Visit  Briana [name] [id number] 1976-04-21 47 y.o. 03/06/2023   Principle Diagnosis:  Stage IIIC (T4N3M0) TRIPLE NEGATIVE infiltrating ductal carcinoma of the left breast  Current Therapy:   Neoadjuvant chemoimmunotherapy -carboplatin/Taxol/pembrolizumab --Adriamycin/Cytoxan/pembrolizumab --start on 02/19/2023     Interim History:  Ms. Delzell is back for an unscheduled visit.  She apparently has not done all that well with the first cycle of treatment.  She is dehydrated.  She has had some diarrhea.  She is clearly hypokalemic.  She also has quite significant tachycardia.  She had her first cycle of chemotherapy last week.  Again, we are not going to give her Taxol today.  She has had a fever.  Today her temperature of 101.2.  We had to have her tested for COVID.  Thankfully she was COVID negative.  There is no cough.  We are checking a urinalysis on her.  She is not neutropenic so that is a blessing.  She has had no obvious bleeding.  There is been no leg swelling.  Currently, I would say performance status is probably ECOG 1.  .    Medications:  Current Outpatient Medications:    amLODipine (NORVASC) 10 MG tablet, Take 1 tablet (10 mg total) by mouth daily., Disp: 90 tablet, Rfl: 1   bictegravir-emtricitabine-tenofovir AF (BIKTARVY) 50-200-25 MG TABS tablet, TAKE 1 TABLET DAILY, Disp: 30 tablet, Rfl: 11   dexamethasone (DECADRON) 4 MG tablet, Take 2 tablets once a day for 3 days after chemotherapy. Take with food., Disp: 30 tablet, Rfl: 1   ferrous sulfate 325 (65 FE) MG EC tablet, Take 325 mg by mouth daily with breakfast., Disp: , Rfl:    hydrochlorothiazide (HYDRODIURIL) 12.5 MG tablet, Take 1 tablet (12.5 mg total) by mouth daily., Disp: 90 tablet, Rfl: 3   lidocaine-prilocaine (EMLA) cream, Apply 1 Application topically as needed. Apply to [facility] 1 hour prior to procedure., Disp: 30 g, Rfl: 0   ondansetron (ZOFRAN) 8 MG tablet, Take 1 tablet  (8 mg total) by mouth every 8 (eight) hours as needed for nausea or vomiting. Start on the third day after chemotherapy., Disp: 30 tablet, Rfl: 1   prochlorperazine (COMPAZINE) 10 MG tablet, Take 1 tablet (10 mg total) by mouth every 6 (six) hours as needed for nausea or vomiting., Disp: 30 tablet, Rfl: 1   traMADol (ULTRAM) 50 MG tablet, Take 1 tablet (50 mg total) by mouth every 6 (six) hours as needed., Disp: 90 tablet, Rfl: 0   triamcinolone (KENALOG) 0.025 % ointment, APPLY TOPICALLY TWICE DAILY AS NEEDED, Disp: 454 g, Rfl: 1   predniSONE (DELTASONE) 20 MG tablet, Take 20 mg by mouth daily with breakfast. (Patient not taking: Reported on 03/06/2023), Disp: , Rfl:  No current facility-administered medications for this visit.  Facility-Administered Medications Ordered in Other Visits:    0.9 %  sodium chloride infusion, , Intravenous, Continuous, Zaniyah Wernette, Rose Phi, MD, Stopped at 03/06/23 1439  Allergies:  Allergies  Allergen Reactions   Pumpkin Flavor Swelling    ONLY Lip swelling after eating pumpkin pie    Past Medical History, Surgical history, Social history, and Family History were reviewed and updated.  Review of Systems: Review of Systems  Constitutional: Negative.   HENT:  Negative.    Eyes: Negative.   Respiratory: Negative.    Cardiovascular: Negative.   Gastrointestinal: Negative.   Endocrine: Negative.   Genitourinary: Negative.    Musculoskeletal: Negative.   Skin: Negative.   Neurological: Negative.  Hematological: Negative.   Psychiatric/Behavioral: Negative.      Physical Exam:  oral temperature is 101.3 F (38.5 C) (abnormal). Her pulse is 131 (abnormal).   Wt Readings from Last 3 Encounters:  02/27/23 184 lb (83.5 kg)  02/12/23 188 lb 1.3 oz (85.3 kg)  02/11/23 190 lb (86.2 kg)    Physical Exam Vitals reviewed.  Constitutional:      Comments: Right breast shows no masses edema or erythema.  There is no right axillary adenopathy.  Left breast shows  a mass at the 2 o'clock position.  This probably measures about 6 cm.  It is firm and nontender.  It is not mobile.  There is a little bit of fullness in the left axilla.  HENT:     Head: Normocephalic and atraumatic.  Eyes:     Pupils: Pupils are equal, round, and reactive to light.  Cardiovascular:     Rate and Rhythm: Normal rate and regular rhythm.     Heart sounds: Normal heart sounds.  Pulmonary:     Effort: Pulmonary effort is normal.     Breath sounds: Normal breath sounds.  Abdominal:     General: Bowel sounds are normal.     Palpations: Abdomen is soft.  Musculoskeletal:        General: No tenderness or deformity. Normal range of motion.     Cervical back: Normal range of motion.  Lymphadenopathy:     Cervical: No cervical adenopathy.  Skin:    General: Skin is warm and dry.     Findings: No erythema or rash.  Neurological:     Mental Status: She is alert and oriented to person, place, and time.  Psychiatric:        Behavior: Behavior normal.        Thought Content: Thought content normal.        Judgment: Judgment normal.      Lab Results  Component Value Date   WBC 7.6 03/06/2023   HGB 12.4 03/06/2023   HCT 38.9 03/06/2023   MCV 74.7 (L) 03/06/2023   PLT 160 03/06/2023     Chemistry      Component Value Date/Time   NA 125 (L) 03/06/2023 0850   K 2.8 (L) 03/06/2023 0850   CL 90 (L) 03/06/2023 0850   CO2 22 03/06/2023 0850   BUN 14 03/06/2023 0850   CREATININE 1.16 (H) 03/06/2023 0850   CREATININE 0.62 10/17/2022 0947      Component Value Date/Time   CALCIUM 8.9 03/06/2023 0850   ALKPHOS 127 (H) 03/06/2023 0850   AST 35 03/06/2023 0850   ALT 36 03/06/2023 0850   BILITOT 0.3 03/06/2023 0850     Impression and Plan: Ms. Auman is a very nice 47 year old premenopausal Afro-American female with TRIPLE NEGATIVE breast cancer.  She has aggressive disease in my opinion.  We will go ahead with neoadjuvant therapy.  We will use a combination of  chemotherapy and immunotherapy.  I think this would be very reasonable.  Again, we cannot treat her today.  I do think there is too much going on.  We had to correct the hypokalemia.  Will give her some IV potassium.  She will get a IV fluid.  We will, again, not treated today.  She will come back next week for the start of her second cycle of treatment.  Josph Macho, MD 7/5/20243:48 PM

## 2023-03-09 ENCOUNTER — Encounter: Payer: Self-pay | Admitting: *Deleted

## 2023-03-09 NOTE — Progress Notes (Signed)
Patient was in the office for her next treatment, however she was significantly symptomatic from her first cycle. She was instead treated with symptom management and rescheduled for this week to reconsider.   Oncology Nurse Navigator Documentation     03/09/2023    1:30 PM  Oncology Nurse Navigator Flowsheets  Navigator Follow Up Date: 03/12/2023  Navigator Follow Up Reason: Follow-up Appointment;Chemotherapy  Navigator Location [facility]  Navigator Encounter Type Appt/Treatment Plan Review  Patient Visit Type MedOnc  Treatment Phase Active Tx  Barriers/Navigation Needs Coordination of Care;Education  Interventions None Required  Acuity Level 2-Minimal Needs (1-2 Barriers Identified)  Support Groups/Services Friends and Family  Time Spent with Patient 15

## 2023-03-12 ENCOUNTER — Inpatient Hospital Stay: Payer: No Typology Code available for payment source

## 2023-03-12 ENCOUNTER — Encounter: Payer: Self-pay | Admitting: Hematology & Oncology

## 2023-03-12 ENCOUNTER — Inpatient Hospital Stay: Payer: No Typology Code available for payment source | Admitting: Hematology & Oncology

## 2023-03-12 ENCOUNTER — Encounter: Payer: Self-pay | Admitting: *Deleted

## 2023-03-12 VITALS — BP 122/74 | HR 97 | Temp 98.8°F | Resp 18 | Ht 65.0 in | Wt 185.8 lb

## 2023-03-12 VITALS — BP 105/70

## 2023-03-12 DIAGNOSIS — C50912 Malignant neoplasm of unspecified site of left female breast: Secondary | ICD-10-CM

## 2023-03-12 DIAGNOSIS — Z21 Asymptomatic human immunodeficiency virus [HIV] infection status: Secondary | ICD-10-CM | POA: Diagnosis not present

## 2023-03-12 DIAGNOSIS — R5081 Fever presenting with conditions classified elsewhere: Secondary | ICD-10-CM

## 2023-03-12 DIAGNOSIS — C773 Secondary and unspecified malignant neoplasm of axilla and upper limb lymph nodes: Secondary | ICD-10-CM | POA: Diagnosis not present

## 2023-03-12 LAB — URINALYSIS, COMPLETE (UACMP) WITH MICROSCOPIC
Bilirubin Urine: NEGATIVE
Glucose, UA: NEGATIVE mg/dL
Hgb urine dipstick: NEGATIVE
Ketones, ur: NEGATIVE mg/dL
Leukocytes,Ua: NEGATIVE
Nitrite: NEGATIVE
Protein, ur: NEGATIVE mg/dL
Specific Gravity, Urine: 1.02 (ref 1.005–1.030)
pH: 7 (ref 5.0–8.0)

## 2023-03-12 LAB — IRON AND IRON BINDING CAPACITY (CC-WL,HP ONLY)
Iron: 106 ug/dL (ref 28–170)
Saturation Ratios: 36 % — ABNORMAL HIGH (ref 10.4–31.8)
TIBC: 295 ug/dL (ref 250–450)
UIBC: 189 ug/dL (ref 148–442)

## 2023-03-12 LAB — FERRITIN: Ferritin: 720 ng/mL — ABNORMAL HIGH (ref 11–307)

## 2023-03-12 LAB — CMP (CANCER CENTER ONLY)
ALT: 53 U/L — ABNORMAL HIGH (ref 0–44)
AST: 42 U/L — ABNORMAL HIGH (ref 15–41)
Albumin: 3.7 g/dL (ref 3.5–5.0)
Alkaline Phosphatase: 181 U/L — ABNORMAL HIGH (ref 38–126)
Anion gap: 12 (ref 5–15)
BUN: 10 mg/dL (ref 6–20)
CO2: 25 mmol/L (ref 22–32)
Calcium: 9.3 mg/dL (ref 8.9–10.3)
Chloride: 98 mmol/L (ref 98–111)
Creatinine: 0.85 mg/dL (ref 0.44–1.00)
GFR, Estimated: >60 mL/min (ref >60)
Glucose, Bld: 153 mg/dL — ABNORMAL HIGH (ref 70–99)
Potassium: 2.9 mmol/L — ABNORMAL LOW (ref 3.5–5.1)
Sodium: 135 mmol/L (ref 135–145)
Total Bilirubin: 0.3 mg/dL (ref 0.3–1.2)
Total Protein: 7.4 g/dL (ref 6.5–8.1)

## 2023-03-12 LAB — CBC WITH DIFFERENTIAL (CANCER CENTER ONLY)
Abs Immature Granulocytes: 0.11 10*3/uL — ABNORMAL HIGH (ref 0.00–0.07)
Basophils Absolute: 0.1 10*3/uL (ref 0.0–0.1)
Basophils Relative: 1 %
Eosinophils Absolute: 0.6 10*3/uL — ABNORMAL HIGH (ref 0.0–0.5)
Eosinophils Relative: 7 %
HCT: 34.1 % — ABNORMAL LOW (ref 36.0–46.0)
Hemoglobin: 10.6 g/dL — ABNORMAL LOW (ref 12.0–15.0)
Immature Granulocytes: 1 %
Lymphocytes Relative: 38 %
Lymphs Abs: 3.2 10*3/uL (ref 0.7–4.0)
MCH: 24.1 pg — ABNORMAL LOW (ref 26.0–34.0)
MCHC: 31.1 g/dL (ref 30.0–36.0)
MCV: 77.7 fL — ABNORMAL LOW (ref 80.0–100.0)
Monocytes Absolute: 0.5 10*3/uL (ref 0.1–1.0)
Monocytes Relative: 5 %
Neutro Abs: 4 10*3/uL (ref 1.7–7.7)
Neutrophils Relative %: 48 %
Platelet Count: 241 10*3/uL (ref 150–400)
RBC: 4.39 MIL/uL (ref 3.87–5.11)
RDW: 17.3 % — ABNORMAL HIGH (ref 11.5–15.5)
WBC Count: 8.3 10*3/uL (ref 4.0–10.5)
nRBC: 0 % (ref 0.0–0.2)

## 2023-03-12 LAB — LACTATE DEHYDROGENASE: LDH: 533 U/L — ABNORMAL HIGH (ref 98–192)

## 2023-03-12 MED ORDER — POTASSIUM CHLORIDE CRYS ER 20 MEQ PO TBCR: 40.0000 meq | EXTENDED_RELEASE_TABLET | Freq: Two times a day (BID) | ORAL | 5 refills | Status: DC

## 2023-03-12 MED ORDER — SODIUM CHLORIDE 0.9% FLUSH
10.0000 mL | INTRAVENOUS | Status: DC | PRN
  Administered 2023-03-12: 10 mL

## 2023-03-12 MED ORDER — SODIUM CHLORIDE 0.9 % IV SOLN: Freq: Once | INTRAVENOUS | Status: AC

## 2023-03-12 MED ORDER — DIPHENHYDRAMINE HCL 50 MG/ML IJ SOLN
50.0000 mg | Freq: Once | INTRAMUSCULAR | Status: AC
  Administered 2023-03-12: 50 mg via INTRAVENOUS
  Filled 2023-03-12: qty 1

## 2023-03-12 MED ORDER — PALONOSETRON HCL INJECTION 0.25 MG/5ML
0.2500 mg | Freq: Once | INTRAVENOUS | Status: AC
  Administered 2023-03-12: 0.25 mg via INTRAVENOUS
  Filled 2023-03-12: qty 5

## 2023-03-12 MED ORDER — SODIUM CHLORIDE 0.9 % IV SOLN
80.0000 mg/m2 | Freq: Once | INTRAVENOUS | Status: AC
  Administered 2023-03-12: 156 mg via INTRAVENOUS
  Filled 2023-03-12: qty 26

## 2023-03-12 MED ORDER — FAMOTIDINE IN NACL 20-0.9 MG/50ML-% IV SOLN
20.0000 mg | Freq: Once | INTRAVENOUS | Status: AC
  Administered 2023-03-12: 20 mg via INTRAVENOUS
  Filled 2023-03-12: qty 50

## 2023-03-12 MED ORDER — SODIUM CHLORIDE 0.9 % IV SOLN
693.5000 mg | Freq: Once | INTRAVENOUS | Status: AC
  Administered 2023-03-12: 690 mg via INTRAVENOUS
  Filled 2023-03-12: qty 69

## 2023-03-12 MED ORDER — SODIUM CHLORIDE 0.9 % IV SOLN
150.0000 mg | Freq: Once | INTRAVENOUS | Status: AC
  Administered 2023-03-12: 150 mg via INTRAVENOUS
  Filled 2023-03-12: qty 150

## 2023-03-12 MED ORDER — SODIUM CHLORIDE 0.9 % IV SOLN
200.0000 mg | Freq: Once | INTRAVENOUS | Status: AC
  Administered 2023-03-12: 200 mg via INTRAVENOUS
  Filled 2023-03-12: qty 8

## 2023-03-12 MED ORDER — HEPARIN SOD (PORK) LOCK FLUSH 100 UNIT/ML IV SOLN
500.0000 [IU] | Freq: Once | INTRAVENOUS | Status: AC | PRN
  Administered 2023-03-12: 500 [IU]

## 2023-03-12 MED ORDER — SODIUM CHLORIDE 0.9 % IV SOLN
10.0000 mg | Freq: Once | INTRAVENOUS | Status: AC
  Administered 2023-03-12: 10 mg via INTRAVENOUS
  Filled 2023-03-12: qty 10

## 2023-03-12 NOTE — Patient Instructions (Addendum)
[facility]  Discharge Instructions: Thank you for choosing [facility] to provide your oncology and hematology care.   If you have a lab appointment with the Cancer Center, please go directly to the Cancer Center and check in at the registration area.  Wear comfortable clothing and clothing appropriate for easy access to any Portacath or PICC line.   We strive to give you quality time with your provider. You may need to reschedule your appointment if you arrive late (15 or more minutes).  Arriving late affects you and other patients whose appointments are after yours.  Also, if you miss three or more appointments without notifying the office, you may be dismissed from the clinic at the provider's discretion.      For prescription refill requests, have your pharmacy contact our office and allow 72 hours for refills to be completed.    Today you received the following chemotherapy and/or immunotherapy agents Keytruda/Taxol/Carboplatin    To help prevent nausea and vomiting after your treatment, we encourage you to take your nausea medication as directed.  BELOW ARE SYMPTOMS THAT SHOULD BE REPORTED IMMEDIATELY: *FEVER GREATER THAN 100.4 F (38 C) OR HIGHER *CHILLS OR SWEATING *NAUSEA AND VOMITING THAT IS NOT CONTROLLED WITH YOUR NAUSEA MEDICATION *UNUSUAL SHORTNESS OF BREATH *UNUSUAL BRUISING OR BLEEDING *URINARY PROBLEMS (pain or burning when urinating, or frequent urination) *BOWEL PROBLEMS (unusual diarrhea, constipation, pain near the anus) TENDERNESS IN MOUTH AND THROAT WITH OR WITHOUT PRESENCE OF ULCERS (sore throat, sores in mouth, or a toothache) UNUSUAL RASH, SWELLING OR PAIN  UNUSUAL VAGINAL DISCHARGE OR ITCHING   Items with * indicate a potential emergency and should be followed up as soon as possible or go to the Emergency Department if any problems should occur.  Please show the CHEMOTHERAPY ALERT CARD or IMMUNOTHERAPY ALERT  CARD at check-in to the Emergency Department and triage nurse. Should you have questions after your visit or need to cancel or reschedule your appointment, please contact [facility]  [phone] and follow the prompts.  Office hours are 8:00 a.m. to 4:30 p.m. Monday - Friday. Please note that voicemails left after 4:00 p.m. may not be returned until the following business day.  We are closed weekends and major holidays. You have access to a nurse at all times for urgent questions. Please call the main number to the clinic [phone] and follow the prompts.  For any non-urgent questions, you may also contact your provider using MyChart. We now offer e-Visits for anyone 36 and older to request care online for non-urgent symptoms. For details visit [email or web address].   Also download the MyChart app! Go to the app store, search "MyChart", open the app, select [place], and log in with your MyChart username and password.

## 2023-03-12 NOTE — Progress Notes (Signed)
Hematology and Oncology Follow Up Visit  Briana [name] [id number] 06/11/1976 47 y.o. 03/12/2023   Principle Diagnosis:  Stage IIIC (T4N3M0) TRIPLE NEGATIVE infiltrating ductal carcinoma of the left breast  Current Therapy:   Neoadjuvant chemoimmunotherapy -carboplatin/Taxol/pembrolizumab --Adriamycin/Cytoxan/pembrolizumab --start on 02/19/2023     Interim History:  [name] is back for follow-up.  We saw her last week when she was quite dehydrated.  She is doing a whole lot better.  She does not have any obvious diarrhea.  She is still having some hypokalemia.  Will have to get her on some oral potassium.  She feels that the chemotherapy has helped with the spread to her breast cancer.  She feels that the breast cancer is less prominent in the left breast.  She has had no cough or shortness of breath.  She has had no nausea or vomiting.  She is eating pretty well.  There is been no leg pain.  She has had no neuropathy in the feet.  Is been no rashes.  Overall, I would say that her performance status today is probably ECOG 0.  Medications:  Current Outpatient Medications:    amLODipine (NORVASC) 10 MG tablet, Take 1 tablet (10 mg total) by mouth daily., Disp: 90 tablet, Rfl: 1   bictegravir-emtricitabine-tenofovir AF (BIKTARVY) 50-200-25 MG TABS tablet, TAKE 1 TABLET DAILY, Disp: 30 tablet, Rfl: 11   dexamethasone (DECADRON) 4 MG tablet, Take 2 tablets once a day for 3 days after chemotherapy. Take with food., Disp: 30 tablet, Rfl: 1   ferrous sulfate 325 (65 FE) MG EC tablet, Take 325 mg by mouth daily with breakfast., Disp: , Rfl:    hydrochlorothiazide (HYDRODIURIL) 12.5 MG tablet, Take 1 tablet (12.5 mg total) by mouth daily., Disp: 90 tablet, Rfl: 3   lidocaine-prilocaine (EMLA) cream, Apply 1 Application topically as needed. Apply to [facility] 1 hour prior to procedure., Disp: 30 g, Rfl: 0   ondansetron (ZOFRAN) 8 MG tablet, Take 1 tablet (8 mg total) by mouth every 8 (eight)  hours as needed for nausea or vomiting. Start on the third day after chemotherapy., Disp: 30 tablet, Rfl: 1   prochlorperazine (COMPAZINE) 10 MG tablet, Take 1 tablet (10 mg total) by mouth every 6 (six) hours as needed for nausea or vomiting., Disp: 30 tablet, Rfl: 1   traMADol (ULTRAM) 50 MG tablet, Take 1 tablet (50 mg total) by mouth every 6 (six) hours as needed., Disp: 90 tablet, Rfl: 0   triamcinolone (KENALOG) 0.025 % ointment, APPLY TOPICALLY TWICE DAILY AS NEEDED, Disp: 454 g, Rfl: 1   predniSONE (DELTASONE) 20 MG tablet, Take 20 mg by mouth daily with breakfast., Disp: , Rfl:   Allergies:  Allergies  Allergen Reactions   Pumpkin Flavor Swelling    ONLY Lip swelling after eating pumpkin pie    Past Medical History, Surgical history, Social history, and Family History were reviewed and updated.  Review of Systems: Review of Systems  Constitutional: Negative.   HENT:  Negative.    Eyes: Negative.   Respiratory: Negative.    Cardiovascular: Negative.   Gastrointestinal: Negative.   Endocrine: Negative.   Genitourinary: Negative.    Musculoskeletal: Negative.   Skin: Negative.   Neurological: Negative.   Hematological: Negative.   Psychiatric/Behavioral: Negative.      Physical Exam:  height is 5\' 5"  (1.651 m) and weight is 185 lb 12 oz (84.3 kg). Her oral temperature is 98.8 F (37.1 C). Her blood pressure is 122/74 and her pulse is  97. Her respiration is 18 and oxygen saturation is 99%.   Wt Readings from Last 3 Encounters:  03/12/23 185 lb 12 oz (84.3 kg)  02/27/23 184 lb (83.5 kg)  02/12/23 188 lb 1.3 oz (85.3 kg)    Physical Exam Vitals reviewed.  Constitutional:      Comments: Right breast shows no masses edema or erythema.  There is no right axillary adenopathy.  Left breast shows a mass at the 2 o'clock position.  This probably measures about 6 cm.  It is firm and nontender.  It is not mobile.  There is a little bit of fullness in the left axilla.  HENT:      Head: Normocephalic and atraumatic.  Eyes:     Pupils: Pupils are equal, round, and reactive to light.  Cardiovascular:     Rate and Rhythm: Normal rate and regular rhythm.     Heart sounds: Normal heart sounds.  Pulmonary:     Effort: Pulmonary effort is normal.     Breath sounds: Normal breath sounds.  Abdominal:     General: Bowel sounds are normal.     Palpations: Abdomen is soft.  Musculoskeletal:        General: No tenderness or deformity. Normal range of motion.     Cervical back: Normal range of motion.  Lymphadenopathy:     Cervical: No cervical adenopathy.  Skin:    General: Skin is warm and dry.     Findings: No erythema or rash.  Neurological:     Mental Status: She is alert and oriented to person, place, and time.  Psychiatric:        Behavior: Behavior normal.        Thought Content: Thought content normal.        Judgment: Judgment normal.     Lab Results  Component Value Date   WBC 8.3 03/12/2023   HGB 10.6 (L) 03/12/2023   HCT 34.1 (L) 03/12/2023   MCV 77.7 (L) 03/12/2023   PLT 241 03/12/2023     Chemistry      Component Value Date/Time   NA 135 03/12/2023 0830   K 2.9 (L) 03/12/2023 0830   CL 98 03/12/2023 0830   CO2 25 03/12/2023 0830   BUN 10 03/12/2023 0830   CREATININE 0.85 03/12/2023 0830   CREATININE 0.62 10/17/2022 0947      Component Value Date/Time   CALCIUM 9.3 03/12/2023 0830   ALKPHOS 181 (H) 03/12/2023 0830   AST 42 (H) 03/12/2023 0830   ALT 53 (H) 03/12/2023 0830   BILITOT 0.3 03/12/2023 0830     Impression and Plan: Briana [name] is a very nice 47 year old premenopausal Afro-American female with TRIPLE NEGATIVE breast cancer.  She has aggressive disease in my opinion.  Will go ahead with her second cycle of neoadjuvant therapy.  We will go ahead and omit day 15 of Taxol.  I am unsure if she could handle 3 weeks of Taxol.  I am glad that she is feeling a lot better.  Her potassium is still on the low side.  We will give her  some oral potassium in the clinic today.  I will send in a prescription for some oral potassium at home (40 mg p.o. twice daily).  Hopefully, we will see that she is responding well with your next set of scans.  I probably would do 4 cycles of treatment and then repeat the MRI at least.  We probably will also need to have a  PET scan.  At least, she is doing much better right now.  Will have her come back next week for day 8 of treatment.  I will plan to see her back myself in 3 weeks when she has her third cycle of treatment.   Josph Macho, MD 7/11/20249:23 AM

## 2023-03-12 NOTE — Progress Notes (Signed)
Days 15-18 removed from Pembro/Carbo/Paclitaxel careplan per Dr. Gustavo Lah instructions.

## 2023-03-12 NOTE — Patient Instructions (Signed)

## 2023-03-12 NOTE — Progress Notes (Signed)
Patient will proceed with her second cycle with some modifications due to toxicity of cycle one. She will not need scans at this time.   Oncology Nurse Navigator Documentation     03/12/2023   11:30 AM  Oncology Nurse Navigator Flowsheets  Navigator Follow Up Date: 03/19/2023  Navigator Follow Up Reason: Follow-up Appointment;Chemotherapy  Navigator Location [facility]  Navigator Encounter Type Treatment;Appt/Treatment Plan Review  Patient Visit Type MedOnc  Treatment Phase Active Tx  Barriers/Navigation Needs Coordination of Care;Education  Interventions Psycho-Social Support  Acuity Level 2-Minimal Needs (1-2 Barriers Identified)  Support Groups/Services Friends and Family  Time Spent with Patient 15

## 2023-03-18 ENCOUNTER — Telehealth: Payer: Self-pay

## 2023-03-18 NOTE — Telephone Encounter (Signed)
PA approved for [facility] 03/18/23-08/31/38. Will fax approval to pharmacy. Juanita Laster, RMA

## 2023-03-18 NOTE — Telephone Encounter (Signed)
Initiated PA for [employer] through Rx benefits.  Juanita Laster, RMA

## 2023-03-19 ENCOUNTER — Inpatient Hospital Stay: Payer: No Typology Code available for payment source

## 2023-03-19 ENCOUNTER — Inpatient Hospital Stay (HOSPITAL_BASED_OUTPATIENT_CLINIC_OR_DEPARTMENT_OTHER): Payer: No Typology Code available for payment source | Admitting: Hematology & Oncology

## 2023-03-19 ENCOUNTER — Encounter: Payer: Self-pay | Admitting: Hematology & Oncology

## 2023-03-19 ENCOUNTER — Encounter: Payer: Self-pay | Admitting: *Deleted

## 2023-03-19 VITALS — BP 125/90 | HR 100 | Temp 97.8°F | Resp 20 | Ht 65.0 in | Wt 186.0 lb

## 2023-03-19 VITALS — BP 112/79 | HR 97

## 2023-03-19 DIAGNOSIS — C773 Secondary and unspecified malignant neoplasm of axilla and upper limb lymph nodes: Secondary | ICD-10-CM

## 2023-03-19 DIAGNOSIS — C50912 Malignant neoplasm of unspecified site of left female breast: Secondary | ICD-10-CM

## 2023-03-19 DIAGNOSIS — Z21 Asymptomatic human immunodeficiency virus [HIV] infection status: Secondary | ICD-10-CM | POA: Diagnosis not present

## 2023-03-19 LAB — CMP (CANCER CENTER ONLY)
ALT: 33 U/L (ref 0–44)
AST: 19 U/L (ref 15–41)
Albumin: 3.5 g/dL (ref 3.5–5.0)
Alkaline Phosphatase: 137 U/L — ABNORMAL HIGH (ref 38–126)
Anion gap: 11 (ref 5–15)
BUN: 25 mg/dL — ABNORMAL HIGH (ref 6–20)
CO2: 20 mmol/L — ABNORMAL LOW (ref 22–32)
Calcium: 8.9 mg/dL (ref 8.9–10.3)
Chloride: 98 mmol/L (ref 98–111)
Creatinine: 0.98 mg/dL (ref 0.44–1.00)
GFR, Estimated: >60 mL/min (ref >60)
Glucose, Bld: 158 mg/dL — ABNORMAL HIGH (ref 70–99)
Potassium: 3.4 mmol/L — ABNORMAL LOW (ref 3.5–5.1)
Sodium: 129 mmol/L — ABNORMAL LOW (ref 135–145)
Total Bilirubin: 0.1 mg/dL — ABNORMAL LOW (ref 0.3–1.2)
Total Protein: 7.7 g/dL (ref 6.5–8.1)

## 2023-03-19 LAB — CBC WITH DIFFERENTIAL (CANCER CENTER ONLY)
Abs Immature Granulocytes: 0.04 10*3/uL (ref 0.00–0.07)
Basophils Absolute: 0.1 10*3/uL (ref 0.0–0.1)
Basophils Relative: 1 %
Eosinophils Absolute: 0.1 10*3/uL (ref 0.0–0.5)
Eosinophils Relative: 1 %
HCT: 35.6 % — ABNORMAL LOW (ref 36.0–46.0)
Hemoglobin: 11.3 g/dL — ABNORMAL LOW (ref 12.0–15.0)
Immature Granulocytes: 1 %
Lymphocytes Relative: 45 %
Lymphs Abs: 3.7 10*3/uL (ref 0.7–4.0)
MCH: 24.7 pg — ABNORMAL LOW (ref 26.0–34.0)
MCHC: 31.7 g/dL (ref 30.0–36.0)
MCV: 77.7 fL — ABNORMAL LOW (ref 80.0–100.0)
Monocytes Absolute: 0.3 10*3/uL (ref 0.1–1.0)
Monocytes Relative: 4 %
Neutro Abs: 4 10*3/uL (ref 1.7–7.7)
Neutrophils Relative %: 48 %
Platelet Count: 206 10*3/uL (ref 150–400)
RBC: 4.58 MIL/uL (ref 3.87–5.11)
RDW: 18 % — ABNORMAL HIGH (ref 11.5–15.5)
WBC Count: 8.1 10*3/uL (ref 4.0–10.5)
nRBC: 0 % (ref 0.0–0.2)

## 2023-03-19 MED ORDER — HEPARIN SOD (PORK) LOCK FLUSH 100 UNIT/ML IV SOLN
500.0000 [IU] | Freq: Once | INTRAVENOUS | Status: AC | PRN
  Administered 2023-03-19: 500 [IU]

## 2023-03-19 MED ORDER — SODIUM CHLORIDE 0.9% FLUSH
10.0000 mL | INTRAVENOUS | Status: DC | PRN
  Administered 2023-03-19: 10 mL

## 2023-03-19 MED ORDER — FAMOTIDINE IN NACL 20-0.9 MG/50ML-% IV SOLN
20.0000 mg | Freq: Once | INTRAVENOUS | Status: AC
  Administered 2023-03-19: 20 mg via INTRAVENOUS
  Filled 2023-03-19: qty 50

## 2023-03-19 MED ORDER — DIPHENHYDRAMINE HCL 50 MG/ML IJ SOLN
50.0000 mg | Freq: Once | INTRAMUSCULAR | Status: AC
  Administered 2023-03-19: 50 mg via INTRAVENOUS
  Filled 2023-03-19: qty 1

## 2023-03-19 MED ORDER — SODIUM CHLORIDE 0.9 % IV SOLN
80.0000 mg/m2 | Freq: Once | INTRAVENOUS | Status: AC
  Administered 2023-03-19: 156 mg via INTRAVENOUS
  Filled 2023-03-19: qty 26

## 2023-03-19 MED ORDER — SODIUM CHLORIDE 0.9 % IV SOLN
10.0000 mg | Freq: Once | INTRAVENOUS | Status: AC
  Administered 2023-03-19: 10 mg via INTRAVENOUS
  Filled 2023-03-19: qty 10

## 2023-03-19 MED ORDER — SODIUM CHLORIDE 0.9 % IV SOLN: Freq: Once | INTRAVENOUS | Status: AC

## 2023-03-19 NOTE — Progress Notes (Unsigned)
Patient is tolerating treatment well after modification. She will not need any scans until after cycle four.   Oncology Nurse Navigator Documentation     03/19/2023    8:30 AM  Oncology Nurse Navigator Flowsheets  Navigator Follow Up Date: 04/02/2023  Navigator Follow Up Reason: Follow-up Appointment;Chemotherapy  Navigator Location [facility]  Navigator Encounter Type Treatment;Appt/Treatment Plan Review  Patient Visit Type MedOnc  Treatment Phase Active Tx  Barriers/Navigation Needs Coordination of Care;Education  Interventions Psycho-Social Support  Acuity Level 2-Minimal Needs (1-2 Barriers Identified)  Support Groups/Services Friends and Family  Time Spent with Patient 15

## 2023-03-19 NOTE — Progress Notes (Signed)
Is here Hematology and Oncology Follow Up Visit  Briana [name] [id number] [birth year] 46 y.o. 03/19/2023   Principle Diagnosis:  Stage IIIC (T4N3M0) TRIPLE NEGATIVE infiltrating ductal carcinoma of the left breast  Current Therapy:   Neoadjuvant chemoimmunotherapy -carboplatin/Taxol/pembrolizumab --Adriamycin/Cytoxan/pembrolizumab --s/p cycle #1 - start on 02/19/2023     Interim History:  [name] is back for follow-up.  She is doing quite nicely right now.  She is tolerating chemotherapy quite well.  She feels that the mass in the left breast is getting better.  She has had no problems with nausea or vomiting.  She has had no mouth sores.  She has had no diarrhea.  Potassium is a little better.  She is on oral potassium at home.  She has had no rashes.  There is been no fever.  She has had no leg swelling.  Overall, I would say that her performance status is probably ECOG 1.     Medications:  Current Outpatient Medications:    amLODipine (NORVASC) 10 MG tablet, Take 1 tablet (10 mg total) by mouth daily., Disp: 90 tablet, Rfl: 1   bictegravir-emtricitabine-tenofovir AF (BIKTARVY) 50-200-25 MG TABS tablet, TAKE 1 TABLET DAILY, Disp: 30 tablet, Rfl: 11   dexamethasone (DECADRON) 4 MG tablet, Take 2 tablets once a day for 3 days after chemotherapy. Take with food., Disp: 30 tablet, Rfl: 1   ferrous sulfate 325 (65 FE) MG EC tablet, Take 325 mg by mouth daily with breakfast., Disp: , Rfl:    hydrochlorothiazide (HYDRODIURIL) 12.5 MG tablet, Take 1 tablet (12.5 mg total) by mouth daily., Disp: 90 tablet, Rfl: 3   lidocaine-prilocaine (EMLA) cream, Apply 1 Application topically as needed. Apply to [facility] 1 hour prior to procedure., Disp: 30 g, Rfl: 0   potassium chloride SA (KLOR-CON M) 20 MEQ tablet, Take 2 tablets (40 mEq total) by mouth 2 (two) times daily., Disp: 60 tablet, Rfl: 5   traMADol (ULTRAM) 50 MG tablet, Take 1 tablet (50 mg total) by mouth every 6 (six) hours as needed., Disp:  90 tablet, Rfl: 0   triamcinolone (KENALOG) 0.025 % ointment, APPLY TOPICALLY TWICE DAILY AS NEEDED, Disp: 454 g, Rfl: 1   ondansetron (ZOFRAN) 8 MG tablet, Take 1 tablet (8 mg total) by mouth every 8 (eight) hours as needed for nausea or vomiting. Start on the third day after chemotherapy. (Patient not taking: Reported on 03/19/2023), Disp: 30 tablet, Rfl: 1   prochlorperazine (COMPAZINE) 10 MG tablet, Take 1 tablet (10 mg total) by mouth every 6 (six) hours as needed for nausea or vomiting. (Patient not taking: Reported on 03/19/2023), Disp: 30 tablet, Rfl: 1  Allergies:  Allergies  Allergen Reactions   Pumpkin Flavor Swelling    ONLY Lip swelling after eating pumpkin pie    Past Medical History, Surgical history, Social history, and Family History were reviewed and updated.  Review of Systems: Review of Systems  Constitutional: Negative.   HENT:  Negative.    Eyes: Negative.   Respiratory: Negative.    Cardiovascular: Negative.   Gastrointestinal: Negative.   Endocrine: Negative.   Genitourinary: Negative.    Musculoskeletal: Negative.   Skin: Negative.   Neurological: Negative.   Hematological: Negative.   Psychiatric/Behavioral: Negative.      Physical Exam:  height is 5\' 5"  (1.651 m) and weight is 186 lb (84.4 kg). Her oral temperature is 97.8 F (36.6 C). Her blood pressure is 125/90 (abnormal) and her pulse is 100. Her respiration is 20 and oxygen  saturation is 100%.   Wt Readings from Last 3 Encounters:  03/19/23 186 lb (84.4 kg)  03/12/23 185 lb 12 oz (84.3 kg)  02/27/23 184 lb (83.5 kg)    Physical Exam Vitals reviewed.  Constitutional:      Comments: Right breast shows no masses edema or erythema.  There is no right axillary adenopathy.  Left breast shows a mass at the 2 o'clock position.  This probably measures about 6 cm.  It is firm and nontender.  It is not mobile.  There is a little bit of fullness in the left axilla.  HENT:     Head: Normocephalic and  atraumatic.  Eyes:     Pupils: Pupils are equal, round, and reactive to light.  Cardiovascular:     Rate and Rhythm: Normal rate and regular rhythm.     Heart sounds: Normal heart sounds.  Pulmonary:     Effort: Pulmonary effort is normal.     Breath sounds: Normal breath sounds.  Abdominal:     General: Bowel sounds are normal.     Palpations: Abdomen is soft.  Musculoskeletal:        General: No tenderness or deformity. Normal range of motion.     Cervical back: Normal range of motion.  Lymphadenopathy:     Cervical: No cervical adenopathy.  Skin:    General: Skin is warm and dry.     Findings: No erythema or rash.  Neurological:     Mental Status: She is alert and oriented to person, place, and time.  Psychiatric:        Behavior: Behavior normal.        Thought Content: Thought content normal.        Judgment: Judgment normal.      Lab Results  Component Value Date   WBC 8.1 03/19/2023   HGB 11.3 (L) 03/19/2023   HCT 35.6 (L) 03/19/2023   MCV 77.7 (L) 03/19/2023   PLT 206 03/19/2023     Chemistry      Component Value Date/Time   NA 129 (L) 03/19/2023 0816   K 3.4 (L) 03/19/2023 0816   CL 98 03/19/2023 0816   CO2 20 (L) 03/19/2023 0816   BUN 25 (H) 03/19/2023 0816   CREATININE 0.98 03/19/2023 0816   CREATININE 0.62 10/17/2022 0947      Component Value Date/Time   CALCIUM 8.9 03/19/2023 0816   ALKPHOS 137 (H) 03/19/2023 0816   AST 19 03/19/2023 0816   ALT 33 03/19/2023 0816   BILITOT 0.1 (L) 03/19/2023 0816     Impression and Plan: [name] is a very nice 47 year old premenopausal Afro-American female with TRIPLE NEGATIVE breast cancer.  She has aggressive disease in my opinion.  She will complete her second cycle of chemotherapy today.  We omitted the day 15 of Taxol just because of toxicity issues.  Again I think she is responding.  I really cannot palpate any adenopathy in the left axilla.  Again we will have her come back in couple more weeks  for the start of her third cycle of treatment.  After 4 cycles, we will then do our restaging studies.   Josph Macho, MD 7/18/20248:50 AM

## 2023-03-19 NOTE — Progress Notes (Signed)
BP 125/90 with 2nd reading. Instructed to take BP at home and notify PCP if remains elevated. Verbalized understanding.

## 2023-03-19 NOTE — Patient Instructions (Signed)

## 2023-03-19 NOTE — Patient Instructions (Signed)
[facility]  Discharge Instructions: Thank you for choosing [facility] to provide your oncology and hematology care.   If you have a lab appointment with the Cancer Center, please go directly to the Cancer Center and check in at the registration area.  Wear comfortable clothing and clothing appropriate for easy access to any Portacath or PICC line.   We strive to give you quality time with your provider. You may need to reschedule your appointment if you arrive late (15 or more minutes).  Arriving late affects you and other patients whose appointments are after yours.  Also, if you miss three or more appointments without notifying the office, you may be dismissed from the clinic at the provider's discretion.      For prescription refill requests, have your pharmacy contact our office and allow 72 hours for refills to be completed.    Today you received the following chemotherapy and/or immunotherapy agents Taxol.   To help prevent nausea and vomiting after your treatment, we encourage you to take your nausea medication as directed.  BELOW ARE SYMPTOMS THAT SHOULD BE REPORTED IMMEDIATELY: *FEVER GREATER THAN 100.4 F (38 C) OR HIGHER *CHILLS OR SWEATING *NAUSEA AND VOMITING THAT IS NOT CONTROLLED WITH YOUR NAUSEA MEDICATION *UNUSUAL SHORTNESS OF BREATH *UNUSUAL BRUISING OR BLEEDING *URINARY PROBLEMS (pain or burning when urinating, or frequent urination) *BOWEL PROBLEMS (unusual diarrhea, constipation, pain near the anus) TENDERNESS IN MOUTH AND THROAT WITH OR WITHOUT PRESENCE OF ULCERS (sore throat, sores in mouth, or a toothache) UNUSUAL RASH, SWELLING OR PAIN  UNUSUAL VAGINAL DISCHARGE OR ITCHING   Items with * indicate a potential emergency and should be followed up as soon as possible or go to the Emergency Department if any problems should occur.  Please show the CHEMOTHERAPY ALERT CARD or IMMUNOTHERAPY ALERT CARD at check-in to  the Emergency Department and triage nurse. Should you have questions after your visit or need to cancel or reschedule your appointment, please contact [facility]  [phone] and follow the prompts.  Office hours are 8:00 a.m. to 4:30 p.m. Monday - Friday. Please note that voicemails left after 4:00 p.m. may not be returned until the following business day.  We are closed weekends and major holidays. You have access to a nurse at all times for urgent questions. Please call the main number to the clinic [phone] and follow the prompts.  For any non-urgent questions, you may also contact your provider using MyChart. We now offer e-Visits for anyone 18 and older to request care online for non-urgent symptoms. For details visit [email or web address].   Also download the MyChart app! Go to the app store, search "MyChart", open the app, select , and log in with your MyChart username and password.   

## 2023-03-25 ENCOUNTER — Encounter: Payer: Self-pay | Admitting: Family

## 2023-03-25 ENCOUNTER — Encounter: Payer: Self-pay | Admitting: Hematology & Oncology

## 2023-04-02 ENCOUNTER — Other Ambulatory Visit: Payer: Self-pay

## 2023-04-02 ENCOUNTER — Encounter: Payer: Self-pay | Admitting: Hematology & Oncology

## 2023-04-02 ENCOUNTER — Inpatient Hospital Stay: Payer: No Typology Code available for payment source

## 2023-04-02 ENCOUNTER — Inpatient Hospital Stay: Payer: No Typology Code available for payment source | Attending: Hematology & Oncology

## 2023-04-02 ENCOUNTER — Inpatient Hospital Stay: Payer: No Typology Code available for payment source | Admitting: Hematology & Oncology

## 2023-04-02 DIAGNOSIS — Z171 Estrogen receptor negative status [ER-]: Secondary | ICD-10-CM | POA: Insufficient documentation

## 2023-04-02 DIAGNOSIS — Z21 Asymptomatic human immunodeficiency virus [HIV] infection status: Secondary | ICD-10-CM | POA: Insufficient documentation

## 2023-04-02 DIAGNOSIS — Z79899 Other long term (current) drug therapy: Secondary | ICD-10-CM | POA: Diagnosis not present

## 2023-04-02 DIAGNOSIS — C50912 Malignant neoplasm of unspecified site of left female breast: Secondary | ICD-10-CM

## 2023-04-02 DIAGNOSIS — C773 Secondary and unspecified malignant neoplasm of axilla and upper limb lymph nodes: Secondary | ICD-10-CM

## 2023-04-02 DIAGNOSIS — Z5111 Encounter for antineoplastic chemotherapy: Secondary | ICD-10-CM | POA: Insufficient documentation

## 2023-04-02 LAB — CBC WITH DIFFERENTIAL (CANCER CENTER ONLY)
Abs Immature Granulocytes: 0.05 10*3/uL (ref 0.00–0.07)
Basophils Absolute: 0 10*3/uL (ref 0.0–0.1)
Basophils Relative: 0 %
Eosinophils Absolute: 0.2 10*3/uL (ref 0.0–0.5)
Eosinophils Relative: 2 %
HCT: 31.2 % — ABNORMAL LOW (ref 36.0–46.0)
Hemoglobin: 10 g/dL — ABNORMAL LOW (ref 12.0–15.0)
Immature Granulocytes: 1 %
Lymphocytes Relative: 38 %
Lymphs Abs: 3.2 10*3/uL (ref 0.7–4.0)
MCH: 26.1 pg (ref 26.0–34.0)
MCHC: 32.1 g/dL (ref 30.0–36.0)
MCV: 81.5 fL (ref 80.0–100.0)
Monocytes Absolute: 0.7 10*3/uL (ref 0.1–1.0)
Monocytes Relative: 8 %
Neutro Abs: 4.2 10*3/uL (ref 1.7–7.7)
Neutrophils Relative %: 51 %
Platelet Count: 170 10*3/uL (ref 150–400)
RBC: 3.83 MIL/uL — ABNORMAL LOW (ref 3.87–5.11)
RDW: 21 % — ABNORMAL HIGH (ref 11.5–15.5)
WBC Count: 8.4 10*3/uL (ref 4.0–10.5)
nRBC: 0 % (ref 0.0–0.2)

## 2023-04-02 LAB — CMP (CANCER CENTER ONLY)
ALT: 22 U/L (ref 0–44)
AST: 23 U/L (ref 15–41)
Albumin: 4 g/dL (ref 3.5–5.0)
Alkaline Phosphatase: 115 U/L (ref 38–126)
Anion gap: 9 (ref 5–15)
BUN: 9 mg/dL (ref 6–20)
CO2: 24 mmol/L (ref 22–32)
Calcium: 8.9 mg/dL (ref 8.9–10.3)
Chloride: 102 mmol/L (ref 98–111)
Creatinine: 1.03 mg/dL — ABNORMAL HIGH (ref 0.44–1.00)
GFR, Estimated: >60 mL/min (ref >60)
Glucose, Bld: 97 mg/dL (ref 70–99)
Potassium: 3.1 mmol/L — ABNORMAL LOW (ref 3.5–5.1)
Sodium: 135 mmol/L (ref 135–145)
Total Bilirubin: 0.3 mg/dL (ref 0.3–1.2)
Total Protein: 7.7 g/dL (ref 6.5–8.1)

## 2023-04-02 LAB — TSH: TSH: 0.083 u[IU]/mL — ABNORMAL LOW (ref 0.350–4.500)

## 2023-04-02 LAB — MAGNESIUM: Magnesium: 1.7 mg/dL (ref 1.7–2.4)

## 2023-04-02 LAB — LACTATE DEHYDROGENASE: LDH: 331 U/L — ABNORMAL HIGH (ref 98–192)

## 2023-04-02 MED ORDER — SODIUM CHLORIDE 0.9% FLUSH
10.0000 mL | INTRAVENOUS | Status: DC | PRN
  Administered 2023-04-02: 10 mL

## 2023-04-02 MED ORDER — SODIUM CHLORIDE 0.9 % IV SOLN
594.5000 mg | Freq: Once | INTRAVENOUS | Status: AC
  Administered 2023-04-02: 590 mg via INTRAVENOUS
  Filled 2023-04-02: qty 59

## 2023-04-02 MED ORDER — SODIUM CHLORIDE 0.9 % IV SOLN
10.0000 mg | Freq: Once | INTRAVENOUS | Status: AC
  Administered 2023-04-02: 10 mg via INTRAVENOUS
  Filled 2023-04-02: qty 10

## 2023-04-02 MED ORDER — PALONOSETRON HCL INJECTION 0.25 MG/5ML
0.2500 mg | Freq: Once | INTRAVENOUS | Status: AC
  Administered 2023-04-02: 0.25 mg via INTRAVENOUS
  Filled 2023-04-02: qty 5

## 2023-04-02 MED ORDER — SODIUM CHLORIDE 0.9 % IV SOLN: Freq: Once | INTRAVENOUS | Status: AC

## 2023-04-02 MED ORDER — HEPARIN SOD (PORK) LOCK FLUSH 100 UNIT/ML IV SOLN
500.0000 [IU] | Freq: Once | INTRAVENOUS | Status: AC | PRN
  Administered 2023-04-02: 500 [IU]

## 2023-04-02 MED ORDER — SODIUM CHLORIDE 0.9 % IV SOLN
80.0000 mg/m2 | Freq: Once | INTRAVENOUS | Status: AC
  Administered 2023-04-02: 156 mg via INTRAVENOUS
  Filled 2023-04-02: qty 26

## 2023-04-02 MED ORDER — SODIUM CHLORIDE 0.9 % IV SOLN
150.0000 mg | Freq: Once | INTRAVENOUS | Status: AC
  Administered 2023-04-02: 150 mg via INTRAVENOUS
  Filled 2023-04-02: qty 150

## 2023-04-02 MED ORDER — SODIUM CHLORIDE 0.9 % IV SOLN
200.0000 mg | Freq: Once | INTRAVENOUS | Status: AC
  Administered 2023-04-02: 200 mg via INTRAVENOUS
  Filled 2023-04-02: qty 8

## 2023-04-02 MED ORDER — FAMOTIDINE IN NACL 20-0.9 MG/50ML-% IV SOLN
20.0000 mg | Freq: Once | INTRAVENOUS | Status: AC
  Administered 2023-04-02: 20 mg via INTRAVENOUS
  Filled 2023-04-02: qty 50

## 2023-04-02 MED ORDER — DIPHENHYDRAMINE HCL 50 MG/ML IJ SOLN
50.0000 mg | Freq: Once | INTRAMUSCULAR | Status: AC
  Administered 2023-04-02: 50 mg via INTRAVENOUS
  Filled 2023-04-02: qty 1

## 2023-04-02 NOTE — Progress Notes (Signed)
OK to treat today with HR of 106 per order of Dr. Myna Hidalgo.

## 2023-04-02 NOTE — Patient Instructions (Signed)

## 2023-04-02 NOTE — Patient Instructions (Signed)
[place] [facility] [facility]  Discharge Instructions: Thank you for choosing [facility] to provide your oncology and hematology care.   If you have a lab appointment with the Cancer Center, please go directly to the Cancer Center and check in at the registration area.  Wear comfortable clothing and clothing appropriate for easy access to any Portacath or PICC line.   We strive to give you quality time with your provider. You may need to reschedule your appointment if you arrive late (15 or more minutes).  Arriving late affects you and other patients whose appointments are after yours.  Also, if you miss three or more appointments without notifying the office, you may be dismissed from the clinic at the provider's discretion.      For prescription refill requests, have your pharmacy contact our office and allow 72 hours for refills to be completed.    Today you received the following chemotherapy and/or immunotherapy agents keytruda, taxol, carboplatin      To help prevent nausea and vomiting after your treatment, we encourage you to take your nausea medication as directed.  BELOW ARE SYMPTOMS THAT SHOULD BE REPORTED IMMEDIATELY: *FEVER GREATER THAN 100.4 F (38 C) OR HIGHER *CHILLS OR SWEATING *NAUSEA AND VOMITING THAT IS NOT CONTROLLED WITH YOUR NAUSEA MEDICATION *UNUSUAL SHORTNESS OF BREATH *UNUSUAL BRUISING OR BLEEDING *URINARY PROBLEMS (pain or burning when urinating, or frequent urination) *BOWEL PROBLEMS (unusual diarrhea, constipation, pain near the anus) TENDERNESS IN MOUTH AND THROAT WITH OR WITHOUT PRESENCE OF ULCERS (sore throat, sores in mouth, or a toothache) UNUSUAL RASH, SWELLING OR PAIN  UNUSUAL VAGINAL DISCHARGE OR ITCHING   Items with * indicate a potential emergency and should be followed up as soon as possible or go to the Emergency Department if any problems should occur.  Please show the CHEMOTHERAPY ALERT CARD or IMMUNOTHERAPY  ALERT CARD at check-in to the Emergency Department and triage nurse. Should you have questions after your visit or need to cancel or reschedule your appointment, please contact [place] [facility]  [phone] and follow the prompts.  Office hours are 8:00 a.m. to 4:30 p.m. Monday - Friday. Please note that voicemails left after 4:00 p.m. may not be returned until the following business day.  We are closed weekends and major holidays. You have access to a nurse at all times for urgent questions. Please call the main number to the clinic [phone] and follow the prompts.  For any non-urgent questions, you may also contact your provider using MyChart. We now offer e-Visits for anyone 56 and older to request care online for non-urgent symptoms. For details visit [email or web address].   Also download the MyChart app! Go to the app store, search "MyChart", open the app, select [facility], and log in with your MyChart username and password.

## 2023-04-02 NOTE — Progress Notes (Signed)
Is here Hematology and Oncology Follow Up Visit  Briana [name] [id number] 1976/08/04 47 y.o. 04/02/2023   Principle Diagnosis:  Stage IIIC (T4N3M0) TRIPLE NEGATIVE infiltrating ductal carcinoma of the left breast  Current Therapy:   Neoadjuvant chemoimmunotherapy -carboplatin/Taxol/pembrolizumab --Adriamycin/Cytoxan/pembrolizumab --s/p cycle #2 - start on 02/19/2023     Interim History:  Briana [name] is back for follow-up.  She is doing quite nicely right now.  She is tolerating chemotherapy quite well.  She feels that the mass in the left breast is getting better.  She has had no problems with nausea or vomiting.  She has had no mouth sores.  She has had no diarrhea.   She is back to work.  She works in Office manager at [employer].    Overall, I would say that her performance status is probably ECOG 1.     Medications:  Current Outpatient Medications:    amLODipine (NORVASC) 10 MG tablet, Take 1 tablet (10 mg total) by mouth daily., Disp: 90 tablet, Rfl: 1   bictegravir-emtricitabine-tenofovir AF (BIKTARVY) 50-200-25 MG TABS tablet, TAKE 1 TABLET DAILY, Disp: 30 tablet, Rfl: 11   dexamethasone (DECADRON) 4 MG tablet, Take 2 tablets once a day for 3 days after chemotherapy. Take with food., Disp: 30 tablet, Rfl: 1   ferrous sulfate 325 (65 FE) MG EC tablet, Take 325 mg by mouth daily with breakfast., Disp: , Rfl:    hydrochlorothiazide (HYDRODIURIL) 12.5 MG tablet, Take 1 tablet (12.5 mg total) by mouth daily., Disp: 90 tablet, Rfl: 3   lidocaine-prilocaine (EMLA) cream, Apply 1 Application topically as needed. Apply to [facility] 1 hour prior to procedure., Disp: 30 g, Rfl: 0   ondansetron (ZOFRAN) 8 MG tablet, Take 1 tablet (8 mg total) by mouth every 8 (eight) hours as needed for nausea or vomiting. Start on the third day after chemotherapy., Disp: 30 tablet, Rfl: 1   potassium chloride SA (KLOR-CON M) 20 MEQ tablet, Take 2 tablets (40 mEq total) by mouth 2 (two) times daily., Disp: 60  tablet, Rfl: 5   traMADol (ULTRAM) 50 MG tablet, Take 1 tablet (50 mg total) by mouth every 6 (six) hours as needed., Disp: 90 tablet, Rfl: 0   triamcinolone (KENALOG) 0.025 % ointment, APPLY TOPICALLY TWICE DAILY AS NEEDED, Disp: 454 g, Rfl: 1   prochlorperazine (COMPAZINE) 10 MG tablet, Take 1 tablet (10 mg total) by mouth every 6 (six) hours as needed for nausea or vomiting. (Patient not taking: Reported on 03/19/2023), Disp: 30 tablet, Rfl: 1  Allergies:  Allergies  Allergen Reactions   Pumpkin Flavor Swelling    ONLY Lip swelling after eating pumpkin pie    Past Medical History, Surgical history, Social history, and Family History were reviewed and updated.  Review of Systems: Review of Systems  Constitutional: Negative.   HENT:  Negative.    Eyes: Negative.   Respiratory: Negative.    Cardiovascular: Negative.   Gastrointestinal: Negative.   Endocrine: Negative.   Genitourinary: Negative.    Musculoskeletal: Negative.   Skin: Negative.   Neurological: Negative.   Hematological: Negative.   Psychiatric/Behavioral: Negative.      Physical Exam:  height is 5\' 5"  (1.651 m) and weight is 191 lb 1.9 oz (86.7 kg). Her oral temperature is 98.1 F (36.7 C). Her blood pressure is 130/93 (abnormal) and her pulse is 106 (abnormal). Her respiration is 19 and oxygen saturation is 100%.   Wt Readings from Last 3 Encounters:  04/02/23 191 lb 1.9 oz (86.7  kg)  03/19/23 186 lb (84.4 kg)  03/12/23 185 lb 12 oz (84.3 kg)    Physical Exam Vitals reviewed.  Constitutional:      Comments: Right breast shows no masses edema or erythema.  There is no right axillary adenopathy.  Left breast shows a mass at the 2 o'clock position.  This probably measures about 6 cm.  It is firm and nontender.  It is not mobile.  There is a little bit of fullness in the left axilla.  HENT:     Head: Normocephalic and atraumatic.  Eyes:     Pupils: Pupils are equal, round, and reactive to light.   Cardiovascular:     Rate and Rhythm: Normal rate and regular rhythm.     Heart sounds: Normal heart sounds.  Pulmonary:     Effort: Pulmonary effort is normal.     Breath sounds: Normal breath sounds.  Abdominal:     General: Bowel sounds are normal.     Palpations: Abdomen is soft.  Musculoskeletal:        General: No tenderness or deformity. Normal range of motion.     Cervical back: Normal range of motion.  Lymphadenopathy:     Cervical: No cervical adenopathy.  Skin:    General: Skin is warm and dry.     Findings: No erythema or rash.  Neurological:     Mental Status: She is alert and oriented to person, place, and time.  Psychiatric:        Behavior: Behavior normal.        Thought Content: Thought content normal.        Judgment: Judgment normal.      Lab Results  Component Value Date   WBC 8.4 04/02/2023   HGB 10.0 (L) 04/02/2023   HCT 31.2 (L) 04/02/2023   MCV 81.5 04/02/2023   PLT 170 04/02/2023     Chemistry      Component Value Date/Time   NA 129 (L) 03/19/2023 0816   K 3.4 (L) 03/19/2023 0816   CL 98 03/19/2023 0816   CO2 20 (L) 03/19/2023 0816   BUN 25 (H) 03/19/2023 0816   CREATININE 0.98 03/19/2023 0816   CREATININE 0.62 10/17/2022 0947      Component Value Date/Time   CALCIUM 8.9 03/19/2023 0816   ALKPHOS 137 (H) 03/19/2023 0816   AST 19 03/19/2023 0816   ALT 33 03/19/2023 0816   BILITOT 0.1 (L) 03/19/2023 0816     Impression and Plan: Briana [name] is a very nice 47 year old premenopausal Afro-American female with TRIPLE NEGATIVE breast cancer.  She has aggressive disease in my opinion.   We will start her third cycle of chemotherapy today.  After the fourth cycle of chemotherapy, we will then repeat her staging studies and hopefully she will then go to surgery.  I have to believe that she is responding.  We will plan to get her back in 3 weeks.   Josph Macho, MD 8/1/20249:18 AM

## 2023-04-07 ENCOUNTER — Encounter: Payer: Self-pay | Admitting: *Deleted

## 2023-04-07 NOTE — Progress Notes (Signed)
Patient cleared for cycle three. She will need restaging after her fourth cycle and then possibly move onto surgery.   Oncology Nurse Navigator Documentation     04/07/2023    2:15 PM  Oncology Nurse Navigator Flowsheets  Navigator Follow Up Date: 04/23/2023  Navigator Follow Up Reason: Follow-up Appointment;Chemotherapy  Navigator Location [facility]  Navigator Encounter Type Appt/Treatment Plan Review  Patient Visit Type MedOnc  Treatment Phase Active Tx  Barriers/Navigation Needs Coordination of Care;Education  Interventions None Required  Acuity Level 2-Minimal Needs (1-2 Barriers Identified)  Support Groups/Services Friends and Family  Time Spent with Patient 15

## 2023-04-09 ENCOUNTER — Inpatient Hospital Stay: Payer: No Typology Code available for payment source | Admitting: Hematology & Oncology

## 2023-04-09 ENCOUNTER — Inpatient Hospital Stay: Payer: No Typology Code available for payment source

## 2023-04-09 ENCOUNTER — Encounter: Payer: Self-pay | Admitting: Hematology & Oncology

## 2023-04-09 DIAGNOSIS — C50912 Malignant neoplasm of unspecified site of left female breast: Secondary | ICD-10-CM

## 2023-04-09 DIAGNOSIS — C773 Secondary and unspecified malignant neoplasm of axilla and upper limb lymph nodes: Secondary | ICD-10-CM

## 2023-04-09 DIAGNOSIS — Z21 Asymptomatic human immunodeficiency virus [HIV] infection status: Secondary | ICD-10-CM | POA: Diagnosis not present

## 2023-04-09 LAB — CBC WITH DIFFERENTIAL (CANCER CENTER ONLY)
Abs Immature Granulocytes: 0.09 10*3/uL — ABNORMAL HIGH (ref 0.00–0.07)
Basophils Absolute: 0 10*3/uL (ref 0.0–0.1)
Basophils Relative: 0 %
Eosinophils Absolute: 0.1 10*3/uL (ref 0.0–0.5)
Eosinophils Relative: 1 %
HCT: 35.4 % — ABNORMAL LOW (ref 36.0–46.0)
Hemoglobin: 11.7 g/dL — ABNORMAL LOW (ref 12.0–15.0)
Immature Granulocytes: 1 %
Lymphocytes Relative: 51 %
Lymphs Abs: 5 10*3/uL — ABNORMAL HIGH (ref 0.7–4.0)
MCH: 26.7 pg (ref 26.0–34.0)
MCHC: 33.1 g/dL (ref 30.0–36.0)
MCV: 80.8 fL (ref 80.0–100.0)
Monocytes Absolute: 0.3 10*3/uL (ref 0.1–1.0)
Monocytes Relative: 3 %
Neutro Abs: 4.4 10*3/uL (ref 1.7–7.7)
Neutrophils Relative %: 44 %
Platelet Count: 313 10*3/uL (ref 150–400)
RBC: 4.38 MIL/uL (ref 3.87–5.11)
RDW: 19.6 % — ABNORMAL HIGH (ref 11.5–15.5)
Smear Review: NORMAL
WBC Count: 9.9 10*3/uL (ref 4.0–10.5)
nRBC: 0 % (ref 0.0–0.2)

## 2023-04-09 LAB — CMP (CANCER CENTER ONLY)
ALT: 18 U/L (ref 0–44)
AST: 15 U/L (ref 15–41)
Albumin: 4.1 g/dL (ref 3.5–5.0)
Alkaline Phosphatase: 133 U/L — ABNORMAL HIGH (ref 38–126)
Anion gap: 13 (ref 5–15)
BUN: 11 mg/dL (ref 6–20)
CO2: 22 mmol/L (ref 22–32)
Calcium: 9.1 mg/dL (ref 8.9–10.3)
Chloride: 97 mmol/L — ABNORMAL LOW (ref 98–111)
Creatinine: 0.88 mg/dL (ref 0.44–1.00)
GFR, Estimated: >60 mL/min (ref >60)
Glucose, Bld: 167 mg/dL — ABNORMAL HIGH (ref 70–99)
Potassium: 3 mmol/L — ABNORMAL LOW (ref 3.5–5.1)
Sodium: 132 mmol/L — ABNORMAL LOW (ref 135–145)
Total Bilirubin: 0.3 mg/dL (ref 0.3–1.2)
Total Protein: 8.2 g/dL — ABNORMAL HIGH (ref 6.5–8.1)

## 2023-04-09 LAB — LACTATE DEHYDROGENASE: LDH: 275 U/L — ABNORMAL HIGH (ref 98–192)

## 2023-04-09 LAB — MAGNESIUM: Magnesium: 1.9 mg/dL (ref 1.7–2.4)

## 2023-04-09 MED ORDER — DIPHENHYDRAMINE HCL 50 MG/ML IJ SOLN
25.0000 mg | Freq: Once | INTRAMUSCULAR | Status: AC
  Administered 2023-04-09: 25 mg via INTRAVENOUS
  Filled 2023-04-09: qty 1

## 2023-04-09 MED ORDER — HEPARIN SOD (PORK) LOCK FLUSH 100 UNIT/ML IV SOLN
500.0000 [IU] | Freq: Once | INTRAVENOUS | Status: AC | PRN
  Administered 2023-04-09: 500 [IU]

## 2023-04-09 MED ORDER — SODIUM CHLORIDE 0.9% FLUSH
10.0000 mL | INTRAVENOUS | Status: DC | PRN
  Administered 2023-04-09: 10 mL

## 2023-04-09 MED ORDER — SODIUM CHLORIDE 0.9 % IV SOLN
10.0000 mg | Freq: Once | INTRAVENOUS | Status: AC
  Administered 2023-04-09: 10 mg via INTRAVENOUS
  Filled 2023-04-09: qty 10

## 2023-04-09 MED ORDER — FAMOTIDINE IN NACL 20-0.9 MG/50ML-% IV SOLN
20.0000 mg | Freq: Once | INTRAVENOUS | Status: AC
  Administered 2023-04-09: 20 mg via INTRAVENOUS
  Filled 2023-04-09: qty 50

## 2023-04-09 MED ORDER — SODIUM CHLORIDE 0.9 % IV SOLN: Freq: Once | INTRAVENOUS | Status: AC

## 2023-04-09 MED ORDER — SODIUM CHLORIDE 0.9 % IV SOLN
80.0000 mg/m2 | Freq: Once | INTRAVENOUS | Status: AC
  Administered 2023-04-09: 156 mg via INTRAVENOUS
  Filled 2023-04-09: qty 26

## 2023-04-09 NOTE — Progress Notes (Signed)
Is here Hematology and Oncology Follow Up Visit  Briana [name] [id number] 1975/10/26 46 y.o. 04/09/2023   Principle Diagnosis:  Stage IIIC (T4N3M0) TRIPLE NEGATIVE infiltrating ductal carcinoma of the left breast  Current Therapy:   Neoadjuvant chemoimmunotherapy -carboplatin/Taxol/pembrolizumab --Adriamycin/Cytoxan/pembrolizumab --s/p cycle #2 - start on 02/19/2023     Interim History:  Briana [name] is back for follow-up.  She is doing quite nicely right now.  She is tolerating chemotherapy quite well.  She feels that the mass in the left breast is getting better.  She has had no problems with nausea or vomiting.  She has had no mouth sores.  She has had no diarrhea.   She is back to work.  She works in Office manager at [employer].    Overall, I would say that her performance status is probably ECOG 1.     Medications:  Current Outpatient Medications:    amLODipine (NORVASC) 10 MG tablet, Take 1 tablet (10 mg total) by mouth daily., Disp: 90 tablet, Rfl: 1   bictegravir-emtricitabine-tenofovir AF (BIKTARVY) 50-200-25 MG TABS tablet, TAKE 1 TABLET DAILY, Disp: 30 tablet, Rfl: 11   dexamethasone (DECADRON) 4 MG tablet, Take 2 tablets once a day for 3 days after chemotherapy. Take with food., Disp: 30 tablet, Rfl: 1   ferrous sulfate 325 (65 FE) MG EC tablet, Take 325 mg by mouth daily with breakfast., Disp: , Rfl:    hydrochlorothiazide (HYDRODIURIL) 12.5 MG tablet, Take 1 tablet (12.5 mg total) by mouth daily., Disp: 90 tablet, Rfl: 3   lidocaine-prilocaine (EMLA) cream, Apply 1 Application topically as needed. Apply to [facility] 1 hour prior to procedure., Disp: 30 g, Rfl: 0   potassium chloride SA (KLOR-CON M) 20 MEQ tablet, Take 2 tablets (40 mEq total) by mouth 2 (two) times daily., Disp: 60 tablet, Rfl: 5   traMADol (ULTRAM) 50 MG tablet, Take 1 tablet (50 mg total) by mouth every 6 (six) hours as needed., Disp: 90 tablet, Rfl: 0   triamcinolone (KENALOG) 0.025 % ointment, APPLY  TOPICALLY TWICE DAILY AS NEEDED, Disp: 454 g, Rfl: 1   ondansetron (ZOFRAN) 8 MG tablet, Take 1 tablet (8 mg total) by mouth every 8 (eight) hours as needed for nausea or vomiting. Start on the third day after chemotherapy. (Patient not taking: Reported on 04/09/2023), Disp: 30 tablet, Rfl: 1   prochlorperazine (COMPAZINE) 10 MG tablet, Take 1 tablet (10 mg total) by mouth every 6 (six) hours as needed for nausea or vomiting. (Patient not taking: Reported on 03/19/2023), Disp: 30 tablet, Rfl: 1  Allergies:  Allergies  Allergen Reactions   Pumpkin Flavor Swelling    ONLY Lip swelling after eating pumpkin pie    Past Medical History, Surgical history, Social history, and Family History were reviewed and updated.  Review of Systems: Review of Systems  Constitutional: Negative.   HENT:  Negative.    Eyes: Negative.   Respiratory: Negative.    Cardiovascular: Negative.   Gastrointestinal: Negative.   Endocrine: Negative.   Genitourinary: Negative.    Musculoskeletal: Negative.   Skin: Negative.   Neurological: Negative.   Hematological: Negative.   Psychiatric/Behavioral: Negative.      Physical Exam:  weight is 185 lb 1.9 oz (84 kg). Her oral temperature is 98.5 F (36.9 C). Her blood pressure is 122/82 and her pulse is 101 (abnormal). Her respiration is 20 and oxygen saturation is 100%.   Wt Readings from Last 3 Encounters:  04/09/23 185 lb 1.9 oz (84 kg)  04/02/23 191 lb 1.9 oz (86.7 kg)  03/19/23 186 lb (84.4 kg)    Physical Exam Vitals reviewed.  Constitutional:      Comments: Right breast shows no masses edema or erythema.  There is no right axillary adenopathy.  Left breast shows a mass at the 2 o'clock position.  This probably measures about 6 cm.  It is firm and nontender.  It is not mobile.  There is a little bit of fullness in the left axilla.  HENT:     Head: Normocephalic and atraumatic.  Eyes:     Pupils: Pupils are equal, round, and reactive to light.   Cardiovascular:     Rate and Rhythm: Normal rate and regular rhythm.     Heart sounds: Normal heart sounds.  Pulmonary:     Effort: Pulmonary effort is normal.     Breath sounds: Normal breath sounds.  Abdominal:     General: Bowel sounds are normal.     Palpations: Abdomen is soft.  Musculoskeletal:        General: No tenderness or deformity. Normal range of motion.     Cervical back: Normal range of motion.  Lymphadenopathy:     Cervical: No cervical adenopathy.  Skin:    General: Skin is warm and dry.     Findings: No erythema or rash.  Neurological:     Mental Status: She is alert and oriented to person, place, and time.  Psychiatric:        Behavior: Behavior normal.        Thought Content: Thought content normal.        Judgment: Judgment normal.      Lab Results  Component Value Date   WBC 9.9 04/09/2023   HGB 11.7 (L) 04/09/2023   HCT 35.4 (L) 04/09/2023   MCV 80.8 04/09/2023   PLT 313 04/09/2023     Chemistry      Component Value Date/Time   NA 135 04/02/2023 0841   K 3.1 (L) 04/02/2023 0841   CL 102 04/02/2023 0841   CO2 24 04/02/2023 0841   BUN 9 04/02/2023 0841   CREATININE 1.03 (H) 04/02/2023 0841   CREATININE 0.62 10/17/2022 0947      Component Value Date/Time   CALCIUM 8.9 04/02/2023 0841   ALKPHOS 115 04/02/2023 0841   AST 23 04/02/2023 0841   ALT 22 04/02/2023 0841   BILITOT 0.3 04/02/2023 0841     Impression and Plan: Briana [name] is a very nice 47 year old premenopausal Afro-American female with TRIPLE NEGATIVE breast cancer.  She has aggressive disease in my opinion.   We will start her third cycle of chemotherapy today.  After the fourth cycle of chemotherapy, we will then repeat her staging studies and hopefully she will then go to surgery.  I have to believe that she is responding.  We will plan to get her back in 2 weeks.   Josph Macho, MD 8/8/20249:16 AM

## 2023-04-09 NOTE — Progress Notes (Signed)
Okay to treat with HR 101 per Dr. Marin Olp.

## 2023-04-09 NOTE — Patient Instructions (Signed)

## 2023-04-09 NOTE — Patient Instructions (Signed)
[facility]  Discharge Instructions: Thank you for choosing [facility] to provide your oncology and hematology care.   If you have a lab appointment with the Cancer Center, please go directly to the Cancer Center and check in at the registration area.  Wear comfortable clothing and clothing appropriate for easy access to any Portacath or PICC line.   We strive to give you quality time with your provider. You may need to reschedule your appointment if you arrive late (15 or more minutes).  Arriving late affects you and other patients whose appointments are after yours.  Also, if you miss three or more appointments without notifying the office, you may be dismissed from the clinic at the provider's discretion.      For prescription refill requests, have your pharmacy contact our office and allow 72 hours for refills to be completed.    Today you received the following chemotherapy and/or immunotherapy agents taxol      To help prevent nausea and vomiting after your treatment, we encourage you to take your nausea medication as directed.  BELOW ARE SYMPTOMS THAT SHOULD BE REPORTED IMMEDIATELY: *FEVER GREATER THAN 100.4 F (38 C) OR HIGHER *CHILLS OR SWEATING *NAUSEA AND VOMITING THAT IS NOT CONTROLLED WITH YOUR NAUSEA MEDICATION *UNUSUAL SHORTNESS OF BREATH *UNUSUAL BRUISING OR BLEEDING *URINARY PROBLEMS (pain or burning when urinating, or frequent urination) *BOWEL PROBLEMS (unusual diarrhea, constipation, pain near the anus) TENDERNESS IN MOUTH AND THROAT WITH OR WITHOUT PRESENCE OF ULCERS (sore throat, sores in mouth, or a toothache) UNUSUAL RASH, SWELLING OR PAIN  UNUSUAL VAGINAL DISCHARGE OR ITCHING   Items with * indicate a potential emergency and should be followed up as soon as possible or go to the Emergency Department if any problems should occur.  Please show the CHEMOTHERAPY ALERT CARD or IMMUNOTHERAPY ALERT CARD at check-in to  the Emergency Department and triage nurse. Should you have questions after your visit or need to cancel or reschedule your appointment, please contact [facility]  [phone] and follow the prompts.  Office hours are 8:00 a.m. to 4:30 p.m. Monday - Friday. Please note that voicemails left after 4:00 p.m. may not be returned until the following business day.  We are closed weekends and major holidays. You have access to a nurse at all times for urgent questions. Please call the main number to the clinic [phone] and follow the prompts.  For any non-urgent questions, you may also contact your provider using MyChart. We now offer e-Visits for anyone 55 and older to request care online for non-urgent symptoms. For details visit [email or web address].   Also download the MyChart app! Go to the app store, search "MyChart", open the app, select [facility], and log in with your MyChart username and password.

## 2023-04-13 NOTE — Progress Notes (Signed)
She is getting cardiotoxic chemotherapy for her breast cancer.  This is standard of care.  Cindee Lame

## 2023-04-16 ENCOUNTER — Ambulatory Visit: Payer: No Typology Code available for payment source | Admitting: Gastroenterology

## 2023-04-16 ENCOUNTER — Encounter (INDEPENDENT_AMBULATORY_CARE_PROVIDER_SITE_OTHER): Payer: Self-pay

## 2023-04-23 ENCOUNTER — Encounter: Payer: Self-pay | Admitting: Hematology & Oncology

## 2023-04-23 ENCOUNTER — Inpatient Hospital Stay: Payer: No Typology Code available for payment source

## 2023-04-23 ENCOUNTER — Encounter: Payer: Self-pay | Admitting: *Deleted

## 2023-04-23 ENCOUNTER — Inpatient Hospital Stay: Payer: No Typology Code available for payment source | Admitting: Hematology & Oncology

## 2023-04-23 VITALS — BP 122/87 | HR 99 | Resp 17

## 2023-04-23 VITALS — BP 126/85 | HR 98 | Temp 99.0°F | Resp 20 | Ht 65.0 in | Wt 184.0 lb

## 2023-04-23 DIAGNOSIS — C50912 Malignant neoplasm of unspecified site of left female breast: Secondary | ICD-10-CM | POA: Diagnosis not present

## 2023-04-23 DIAGNOSIS — C773 Secondary and unspecified malignant neoplasm of axilla and upper limb lymph nodes: Secondary | ICD-10-CM

## 2023-04-23 DIAGNOSIS — Z21 Asymptomatic human immunodeficiency virus [HIV] infection status: Secondary | ICD-10-CM | POA: Diagnosis not present

## 2023-04-23 LAB — CMP (CANCER CENTER ONLY)
ALT: 37 U/L (ref 0–44)
AST: 53 U/L — ABNORMAL HIGH (ref 15–41)
Albumin: 4.2 g/dL (ref 3.5–5.0)
Alkaline Phosphatase: 109 U/L (ref 38–126)
Anion gap: 10 (ref 5–15)
BUN: 6 mg/dL (ref 6–20)
CO2: 26 mmol/L (ref 22–32)
Calcium: 9.7 mg/dL (ref 8.9–10.3)
Chloride: 99 mmol/L (ref 98–111)
Creatinine: 0.86 mg/dL (ref 0.44–1.00)
GFR, Estimated: >60 mL/min (ref >60)
Glucose, Bld: 146 mg/dL — ABNORMAL HIGH (ref 70–99)
Potassium: 2.9 mmol/L — ABNORMAL LOW (ref 3.5–5.1)
Sodium: 135 mmol/L (ref 135–145)
Total Bilirubin: 0.5 mg/dL (ref 0.3–1.2)
Total Protein: 8 g/dL (ref 6.5–8.1)

## 2023-04-23 LAB — CBC WITH DIFFERENTIAL (CANCER CENTER ONLY)
Abs Immature Granulocytes: 0.02 10*3/uL (ref 0.00–0.07)
Basophils Absolute: 0 10*3/uL (ref 0.0–0.1)
Basophils Relative: 0 %
Eosinophils Absolute: 0.2 10*3/uL (ref 0.0–0.5)
Eosinophils Relative: 3 %
HCT: 34.2 % — ABNORMAL LOW (ref 36.0–46.0)
Hemoglobin: 11.2 g/dL — ABNORMAL LOW (ref 12.0–15.0)
Immature Granulocytes: 0 %
Lymphocytes Relative: 32 %
Lymphs Abs: 2.4 10*3/uL (ref 0.7–4.0)
MCH: 27.7 pg (ref 26.0–34.0)
MCHC: 32.7 g/dL (ref 30.0–36.0)
MCV: 84.4 fL (ref 80.0–100.0)
Monocytes Absolute: 0.7 10*3/uL (ref 0.1–1.0)
Monocytes Relative: 9 %
Neutro Abs: 4.3 10*3/uL (ref 1.7–7.7)
Neutrophils Relative %: 56 %
Platelet Count: 187 10*3/uL (ref 150–400)
RBC: 4.05 MIL/uL (ref 3.87–5.11)
RDW: 21 % — ABNORMAL HIGH (ref 11.5–15.5)
WBC Count: 7.6 10*3/uL (ref 4.0–10.5)
nRBC: 0 % (ref 0.0–0.2)

## 2023-04-23 LAB — LACTATE DEHYDROGENASE: LDH: 342 U/L — ABNORMAL HIGH (ref 98–192)

## 2023-04-23 MED ORDER — HEPARIN SOD (PORK) LOCK FLUSH 100 UNIT/ML IV SOLN
500.0000 [IU] | Freq: Once | INTRAVENOUS | Status: AC | PRN
  Administered 2023-04-23: 500 [IU]

## 2023-04-23 MED ORDER — SODIUM CHLORIDE 0.9 % IV SOLN
150.0000 mg | Freq: Once | INTRAVENOUS | Status: AC
  Administered 2023-04-23: 150 mg via INTRAVENOUS
  Filled 2023-04-23: qty 150

## 2023-04-23 MED ORDER — FAMOTIDINE IN NACL 20-0.9 MG/50ML-% IV SOLN
20.0000 mg | Freq: Once | INTRAVENOUS | Status: AC
  Administered 2023-04-23: 20 mg via INTRAVENOUS
  Filled 2023-04-23: qty 50

## 2023-04-23 MED ORDER — POTASSIUM CHLORIDE CRYS ER 20 MEQ PO TBCR: 40.0000 meq | EXTENDED_RELEASE_TABLET | Freq: Two times a day (BID) | ORAL | Status: DC

## 2023-04-23 MED ORDER — SODIUM CHLORIDE 0.9 % IV SOLN
10.0000 mg | Freq: Once | INTRAVENOUS | Status: AC
  Administered 2023-04-23: 10 mg via INTRAVENOUS
  Filled 2023-04-23: qty 10

## 2023-04-23 MED ORDER — SODIUM CHLORIDE 0.9 % IV SOLN: Freq: Once | INTRAVENOUS | Status: AC

## 2023-04-23 MED ORDER — POTASSIUM CHLORIDE CRYS ER 20 MEQ PO TBCR
40.0000 meq | EXTENDED_RELEASE_TABLET | Freq: Once | ORAL | Status: AC
  Administered 2023-04-23: 40 meq via ORAL
  Filled 2023-04-23: qty 2

## 2023-04-23 MED ORDER — PALONOSETRON HCL INJECTION 0.25 MG/5ML
0.2500 mg | Freq: Once | INTRAVENOUS | Status: AC
  Administered 2023-04-23: 0.25 mg via INTRAVENOUS
  Filled 2023-04-23: qty 5

## 2023-04-23 MED ORDER — SODIUM CHLORIDE 0.9 % IV SOLN
687.0000 mg | Freq: Once | INTRAVENOUS | Status: AC
  Administered 2023-04-23: 690 mg via INTRAVENOUS
  Filled 2023-04-23: qty 69

## 2023-04-23 MED ORDER — SODIUM CHLORIDE 0.9% FLUSH
10.0000 mL | INTRAVENOUS | Status: DC | PRN
  Administered 2023-04-23: 10 mL

## 2023-04-23 MED ORDER — DIPHENHYDRAMINE HCL 50 MG/ML IJ SOLN
25.0000 mg | Freq: Once | INTRAMUSCULAR | Status: AC
  Administered 2023-04-23: 25 mg via INTRAVENOUS
  Filled 2023-04-23: qty 1

## 2023-04-23 MED ORDER — SODIUM CHLORIDE 0.9 % IV SOLN
200.0000 mg | Freq: Once | INTRAVENOUS | Status: AC
  Administered 2023-04-23: 200 mg via INTRAVENOUS
  Filled 2023-04-23: qty 8

## 2023-04-23 MED ORDER — SODIUM CHLORIDE 0.9 % IV SOLN
80.0000 mg/m2 | Freq: Once | INTRAVENOUS | Status: AC
  Administered 2023-04-23: 156 mg via INTRAVENOUS
  Filled 2023-04-23: qty 26

## 2023-04-23 NOTE — Patient Instructions (Signed)

## 2023-04-23 NOTE — Progress Notes (Signed)
Patient cleared for her last treatment cycle. She will get an MRI after this cycle and hopefully proceed to surgery.   Oncology Nurse Navigator Documentation     04/23/2023   12:00 PM  Oncology Nurse Navigator Flowsheets  Navigator Follow Up Date: 04/30/2023  Navigator Follow Up Reason: Follow-up Appointment;Chemotherapy  Navigator Location [facility]  Navigator Encounter Type Treatment;Appt/Treatment Plan Review  Patient Visit Type MedOnc  Treatment Phase Active Tx  Barriers/Navigation Needs Coordination of Care;Education  Interventions Psycho-Social Support;Education  Acuity Level 2-Minimal Needs (1-2 Barriers Identified)  Education Method Other  Support Groups/Services Friends and Family  Time Spent with Patient 15

## 2023-04-23 NOTE — Progress Notes (Signed)
Is here Hematology and Oncology Follow Up Visit  [name] [id number] Nov 26, 1975 47 y.o. 04/23/2023   Principle Diagnosis:  Stage IIIC (T4N3M0) TRIPLE NEGATIVE infiltrating ductal carcinoma of the left breast  Current Therapy:   Neoadjuvant  chemoimmunotherapy --- carboplatin/Taxol/pembrolizumab ---s/p cycle #3 - start on 02/19/2023     Interim History:  [name] is back for follow-up.  I would like to hope that this will be her last cycle of neoadjuvant chemoimmunotherapy.  So far, she has done well.  She is doing quite nicely right now.  She is tolerating chemotherapy quite well.  She feels that the mass in the left breast is getting better.  She has had no problems with nausea or vomiting.  She has had no mouth sores.  She has had no diarrhea.   She is back to work.  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  She actually tells me stories about some of the more adventure some students.  Overall, I would say that her performance status is probably ECOG 1.     Medications:  Current Outpatient Medications:    amLODipine (NORVASC) 10 MG tablet, Take 1 tablet (10 mg total) by mouth daily., Disp: 90 tablet, Rfl: 1   bictegravir-emtricitabine-tenofovir AF (BIKTARVY) 50-200-25 MG TABS tablet, TAKE 1 TABLET DAILY, Disp: 30 tablet, Rfl: 11   dexamethasone (DECADRON) 4 MG tablet, Take 2 tablets once a day for 3 days after chemotherapy. Take with food., Disp: 30 tablet, Rfl: 1   ferrous sulfate 325 (65 FE) MG EC tablet, Take 325 mg by mouth daily with breakfast., Disp: , Rfl:    hydrochlorothiazide (HYDRODIURIL) 12.5 MG tablet, Take 1 tablet (12.5 mg total) by mouth daily., Disp: 90 tablet, Rfl: 3   lidocaine-prilocaine (EMLA) cream, Apply 1 Application topically as needed. Apply to [facility] 1 hour prior to procedure., Disp: 30 g, Rfl: 0   loperamide (IMODIUM A-D) 2 MG tablet, Take 2 mg by mouth 4 (four) times daily as needed  for diarrhea or loose stools., Disp: , Rfl:    potassium chloride SA (KLOR-CON M) 20 MEQ tablet, Take 2 tablets (40 mEq total) by mouth 2 (two) times daily., Disp: 60 tablet, Rfl: 5   predniSONE (DELTASONE) 10 MG tablet, Take 10 mg by mouth daily with breakfast., Disp: , Rfl:    traMADol (ULTRAM) 50 MG tablet, Take 1 tablet (50 mg total) by mouth every 6 (six) hours as needed., Disp: 90 tablet, Rfl: 0   triamcinolone (KENALOG) 0.025 % ointment, APPLY TOPICALLY TWICE DAILY AS NEEDED, Disp: 454 g, Rfl: 1   clobetasol cream (TEMOVATE) 0.05 %, Apply 1 Application topically 2 (two) times daily. (Patient not taking: Reported on 04/23/2023), Disp: , Rfl:    ondansetron (ZOFRAN) 8 MG tablet, Take 1 tablet (8 mg total) by mouth every 8 (eight) hours as needed for nausea or vomiting. Start on the third day after chemotherapy. (Patient not taking: Reported on 04/09/2023), Disp: 30 tablet, Rfl: 1   prochlorperazine (COMPAZINE) 10 MG tablet, Take 1 tablet (10 mg total) by mouth every 6 (six) hours as needed for nausea or vomiting. (Patient not taking: Reported on 03/19/2023), Disp: 30 tablet, Rfl: 1  Allergies:  Allergies  Allergen Reactions   Pumpkin Flavor Swelling    ONLY Lip swelling after eating pumpkin pie    Past Medical History, Surgical history, Social history, and Family History were reviewed and updated.  Review of Systems:  Review of Systems  Constitutional: Negative.   HENT:  Negative.    Eyes: Negative.   Respiratory: Negative.    Cardiovascular: Negative.   Gastrointestinal: Negative.   Endocrine: Negative.   Genitourinary: Negative.    Musculoskeletal: Negative.   Skin: Negative.   Neurological: Negative.   Hematological: Negative.   Psychiatric/Behavioral: Negative.      Physical Exam:  height is 5\' 5"  (1.651 m) and weight is 184 lb (83.5 kg). Her oral temperature is 99 F (37.2 C). Her blood pressure is 126/85 and her pulse is 98. Her respiration is 20 and oxygen saturation is  100%.   Wt Readings from Last 3 Encounters:  04/23/23 184 lb (83.5 kg)  04/09/23 185 lb 1.9 oz (84 kg)  04/02/23 191 lb 1.9 oz (86.7 kg)    Physical Exam Vitals reviewed.  Constitutional:      Comments: Right breast shows no masses edema or erythema.  There is no right axillary adenopathy.  Left breast really does not show any masses.  I cannot palpate any distinct mass in the left breast now.  I cannot palpate any left axilla lymph nodes.    HENT:     Head: Normocephalic and atraumatic.  Eyes:     Pupils: Pupils are equal, round, and reactive to light.  Cardiovascular:     Rate and Rhythm: Normal rate and regular rhythm.     Heart sounds: Normal heart sounds.  Pulmonary:     Effort: Pulmonary effort is normal.     Breath sounds: Normal breath sounds.  Abdominal:     General: Bowel sounds are normal.     Palpations: Abdomen is soft.  Musculoskeletal:        General: No tenderness or deformity. Normal range of motion.     Cervical back: Normal range of motion.  Lymphadenopathy:     Cervical: No cervical adenopathy.  Skin:    General: Skin is warm and dry.     Findings: No erythema or rash.  Neurological:     Mental Status: She is alert and oriented to person, place, and time.  Psychiatric:        Behavior: Behavior normal.        Thought Content: Thought content normal.        Judgment: Judgment normal.      Lab Results  Component Value Date   WBC 7.6 04/23/2023   HGB 11.2 (L) 04/23/2023   HCT 34.2 (L) 04/23/2023   MCV 84.4 04/23/2023   PLT 187 04/23/2023     Chemistry      Component Value Date/Time   NA 132 (L) 04/09/2023 0841   K 3.0 (L) 04/09/2023 0841   CL 97 (L) 04/09/2023 0841   CO2 22 04/09/2023 0841   BUN 11 04/09/2023 0841   CREATININE 0.88 04/09/2023 0841   CREATININE 0.62 10/17/2022 0947      Component Value Date/Time   CALCIUM 9.1 04/09/2023 0841   ALKPHOS 133 (H) 04/09/2023 0841   AST 15 04/09/2023 0841   ALT 18 04/09/2023 0841   BILITOT  0.3 04/09/2023 0841     Impression and Plan: [name] is a very nice 47 year old premenopausal Afro-American female with TRIPLE NEGATIVE breast cancer.  She has aggressive disease in my opinion.  This will be her fourth and final cycle of neoadjuvant chemoimmunotherapy.  I think she has responded well.  I cannot palpate anything in the left breast now.  I am sure there is probably still some  tumor there.  However, I would like to hope that this will be able to be resected out much more easily.  We will see about a MRI to be done in a couple weeks.  I think she sees the surgeon next week.  Hopefully, she will be able to have surgery sometime in September.  Again, I think she really has responded nicely to treatment.  I am just very impressed as to how well she is tolerated therapy.  I will plan to see her back myself in about a month.  I suppose we might be able to see her back after she has her surgery.  Again I am not sure when surgery will be scheduled.   Josph Macho, MD 8/22/20248:48 AM

## 2023-04-24 ENCOUNTER — Encounter: Payer: Self-pay | Admitting: Hematology & Oncology

## 2023-04-24 ENCOUNTER — Encounter: Payer: Self-pay | Admitting: Family

## 2023-04-30 ENCOUNTER — Inpatient Hospital Stay (HOSPITAL_BASED_OUTPATIENT_CLINIC_OR_DEPARTMENT_OTHER): Payer: No Typology Code available for payment source | Admitting: Hematology & Oncology

## 2023-04-30 ENCOUNTER — Encounter: Payer: Self-pay | Admitting: *Deleted

## 2023-04-30 ENCOUNTER — Inpatient Hospital Stay: Payer: No Typology Code available for payment source

## 2023-04-30 ENCOUNTER — Encounter: Payer: Self-pay | Admitting: Hematology & Oncology

## 2023-04-30 ENCOUNTER — Other Ambulatory Visit: Payer: Self-pay

## 2023-04-30 VITALS — BP 141/99 | HR 92 | Resp 17

## 2023-04-30 DIAGNOSIS — C50912 Malignant neoplasm of unspecified site of left female breast: Secondary | ICD-10-CM

## 2023-04-30 DIAGNOSIS — Z21 Asymptomatic human immunodeficiency virus [HIV] infection status: Secondary | ICD-10-CM | POA: Diagnosis not present

## 2023-04-30 DIAGNOSIS — C773 Secondary and unspecified malignant neoplasm of axilla and upper limb lymph nodes: Secondary | ICD-10-CM

## 2023-04-30 LAB — CBC WITH DIFFERENTIAL (CANCER CENTER ONLY)
Abs Immature Granulocytes: 0.11 10*3/uL — ABNORMAL HIGH (ref 0.00–0.07)
Basophils Absolute: 0 10*3/uL (ref 0.0–0.1)
Basophils Relative: 0 %
Eosinophils Absolute: 0 10*3/uL (ref 0.0–0.5)
Eosinophils Relative: 0 %
HCT: 34.2 % — ABNORMAL LOW (ref 36.0–46.0)
Hemoglobin: 11.4 g/dL — ABNORMAL LOW (ref 12.0–15.0)
Immature Granulocytes: 1 %
Lymphocytes Relative: 35 %
Lymphs Abs: 5 10*3/uL — ABNORMAL HIGH (ref 0.7–4.0)
MCH: 28.1 pg (ref 26.0–34.0)
MCHC: 33.3 g/dL (ref 30.0–36.0)
MCV: 84.2 fL (ref 80.0–100.0)
Monocytes Absolute: 0.4 10*3/uL (ref 0.1–1.0)
Monocytes Relative: 3 %
Neutro Abs: 8.9 10*3/uL — ABNORMAL HIGH (ref 1.7–7.7)
Neutrophils Relative %: 61 %
Platelet Count: 148 10*3/uL — ABNORMAL LOW (ref 150–400)
RBC: 4.06 MIL/uL (ref 3.87–5.11)
RDW: 19.9 % — ABNORMAL HIGH (ref 11.5–15.5)
WBC Count: 14.5 10*3/uL — ABNORMAL HIGH (ref 4.0–10.5)
nRBC: 0 % (ref 0.0–0.2)

## 2023-04-30 LAB — CMP (CANCER CENTER ONLY)
ALT: 35 U/L (ref 0–44)
AST: 18 U/L (ref 15–41)
Albumin: 4.4 g/dL (ref 3.5–5.0)
Alkaline Phosphatase: 105 U/L (ref 38–126)
Anion gap: 11 (ref 5–15)
BUN: 17 mg/dL (ref 6–20)
CO2: 24 mmol/L (ref 22–32)
Calcium: 9.8 mg/dL (ref 8.9–10.3)
Chloride: 100 mmol/L (ref 98–111)
Creatinine: 0.77 mg/dL (ref 0.44–1.00)
GFR, Estimated: >60 mL/min (ref >60)
Glucose, Bld: 106 mg/dL — ABNORMAL HIGH (ref 70–99)
Potassium: 3.3 mmol/L — ABNORMAL LOW (ref 3.5–5.1)
Sodium: 135 mmol/L (ref 135–145)
Total Bilirubin: 0.2 mg/dL — ABNORMAL LOW (ref 0.3–1.2)
Total Protein: 8 g/dL (ref 6.5–8.1)

## 2023-04-30 LAB — LACTATE DEHYDROGENASE: LDH: 345 U/L — ABNORMAL HIGH (ref 98–192)

## 2023-04-30 MED ORDER — FAMOTIDINE IN NACL 20-0.9 MG/50ML-% IV SOLN
20.0000 mg | Freq: Once | INTRAVENOUS | Status: AC
  Administered 2023-04-30: 20 mg via INTRAVENOUS
  Filled 2023-04-30: qty 50

## 2023-04-30 MED ORDER — HEPARIN SOD (PORK) LOCK FLUSH 100 UNIT/ML IV SOLN
500.0000 [IU] | Freq: Once | INTRAVENOUS | Status: AC | PRN
  Administered 2023-04-30: 500 [IU]

## 2023-04-30 MED ORDER — SODIUM CHLORIDE 0.9 % IV SOLN
80.0000 mg/m2 | Freq: Once | INTRAVENOUS | Status: AC
  Administered 2023-04-30: 156 mg via INTRAVENOUS
  Filled 2023-04-30: qty 26

## 2023-04-30 MED ORDER — SODIUM CHLORIDE 0.9 % IV SOLN
10.0000 mg | Freq: Once | INTRAVENOUS | Status: AC
  Administered 2023-04-30: 10 mg via INTRAVENOUS
  Filled 2023-04-30: qty 10

## 2023-04-30 MED ORDER — SODIUM CHLORIDE 0.9% FLUSH
10.0000 mL | INTRAVENOUS | Status: DC | PRN
  Administered 2023-04-30: 10 mL

## 2023-04-30 MED ORDER — SODIUM CHLORIDE 0.9 % IV SOLN: Freq: Once | INTRAVENOUS | Status: AC

## 2023-04-30 MED ORDER — DIPHENHYDRAMINE HCL 50 MG/ML IJ SOLN
25.0000 mg | Freq: Once | INTRAMUSCULAR | Status: AC
  Administered 2023-04-30: 25 mg via INTRAVENOUS
  Filled 2023-04-30: qty 1

## 2023-04-30 NOTE — Progress Notes (Signed)
Patient will receive her last dose of neoadjuvant chemo today! She is scheduled to see her surgeon this week and then an MRI next week.   Oncology Nurse Navigator Documentation     04/30/2023    8:45 AM  Oncology Nurse Navigator Flowsheets  Phase of Treatment Chemo  Chemotherapy Actual End Date: 04/30/2023  Navigator Follow Up Date: 05/06/2023  Navigator Follow Up Reason: Scan Review  Navigator Location [facility]  Navigator Encounter Type Treatment;Appt/Treatment Plan Review  Patient Visit Type MedOnc  Treatment Phase Final Chemo TX  Barriers/Navigation Needs Coordination of Care;Education  Interventions Psycho-Social Support  Acuity Level 2-Minimal Needs (1-2 Barriers Identified)  Support Groups/Services Friends and Family  Time Spent with Patient 15

## 2023-04-30 NOTE — Progress Notes (Signed)
Is here Hematology and Oncology Follow Up Visit  ARAN RENIER [id number] December 23, 1975 47 y.o. 04/30/2023   Principle Diagnosis:  Stage IIIC (T4N3M0) TRIPLE NEGATIVE infiltrating ductal carcinoma of the left breast  Current Therapy:   Neoadjuvant  chemoimmunotherapy --- carboplatin/Taxol/pembrolizumab ---s/p cycle #3 - start on 02/19/2023     Interim History:  Ms. Casasanta is back for follow-up.  I would like to hope that this will be her last cycle of neoadjuvant chemoimmunotherapy.  So far, she has done well.  She is doing quite nicely right now.  She is tolerating chemotherapy quite well.  She feels that the mass in the left breast is getting better.  She has had no problems with nausea or vomiting.  She has had no mouth sores.  She has had no diarrhea.   She is back to work.  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  She actually tells me stories about some of the more adventure some students.  Overall, I would say that her performance status is probably ECOG 1.     Medications:  Current Outpatient Medications:    amLODipine (NORVASC) 10 MG tablet, Take 1 tablet (10 mg total) by mouth daily., Disp: 90 tablet, Rfl: 1   bictegravir-emtricitabine-tenofovir AF (BIKTARVY) 50-200-25 MG TABS tablet, TAKE 1 TABLET DAILY, Disp: 30 tablet, Rfl: 11   dexamethasone (DECADRON) 4 MG tablet, Take 2 tablets once a day for 3 days after chemotherapy. Take with food., Disp: 30 tablet, Rfl: 1   ferrous sulfate 325 (65 FE) MG EC tablet, Take 325 mg by mouth daily with breakfast., Disp: , Rfl:    hydrochlorothiazide (HYDRODIURIL) 12.5 MG tablet, Take 1 tablet (12.5 mg total) by mouth daily., Disp: 90 tablet, Rfl: 3   lidocaine-prilocaine (EMLA) cream, Apply 1 Application topically as needed. Apply to [facility] 1 hour prior to procedure., Disp: 30 g, Rfl: 0   potassium chloride SA (KLOR-CON M) 20 MEQ tablet, Take 2 tablets (40 mEq total) by  mouth 2 (two) times daily., Disp: 60 tablet, Rfl: 5   predniSONE (DELTASONE) 10 MG tablet, Take 10 mg by mouth daily with breakfast., Disp: , Rfl:    traMADol (ULTRAM) 50 MG tablet, Take 1 tablet (50 mg total) by mouth every 6 (six) hours as needed., Disp: 90 tablet, Rfl: 0   triamcinolone (KENALOG) 0.025 % ointment, APPLY TOPICALLY TWICE DAILY AS NEEDED, Disp: 454 g, Rfl: 1   clobetasol cream (TEMOVATE) 0.05 %, Apply 1 Application topically 2 (two) times daily. (Patient not taking: Reported on 04/23/2023), Disp: , Rfl:    loperamide (IMODIUM A-D) 2 MG tablet, Take 2 mg by mouth 4 (four) times daily as needed for diarrhea or loose stools. (Patient not taking: Reported on 04/30/2023), Disp: , Rfl:    ondansetron (ZOFRAN) 8 MG tablet, Take 1 tablet (8 mg total) by mouth every 8 (eight) hours as needed for nausea or vomiting. Start on the third day after chemotherapy. (Patient not taking: Reported on 04/09/2023), Disp: 30 tablet, Rfl: 1   prochlorperazine (COMPAZINE) 10 MG tablet, Take 1 tablet (10 mg total) by mouth every 6 (six) hours as needed for nausea or vomiting. (Patient not taking: Reported on 03/19/2023), Disp: 30 tablet, Rfl: 1  Allergies:  Allergies  Allergen Reactions   Pumpkin Flavor Swelling    ONLY Lip swelling after eating pumpkin pie    Past Medical History, Surgical history, Social history, and Family History were reviewed  and updated.  Review of Systems: Review of Systems  Constitutional: Negative.   HENT:  Negative.    Eyes: Negative.   Respiratory: Negative.    Cardiovascular: Negative.   Gastrointestinal: Negative.   Endocrine: Negative.   Genitourinary: Negative.    Musculoskeletal: Negative.   Skin: Negative.   Neurological: Negative.   Hematological: Negative.   Psychiatric/Behavioral: Negative.      Physical Exam:  height is 5\' 5"  (1.651 m) and weight is 185 lb (83.9 kg). Her oral temperature is 98.3 F (36.8 C). Her blood pressure is 157/88 (abnormal) and her  pulse is 93. Her respiration is 18 and oxygen saturation is 100%.   Wt Readings from Last 3 Encounters:  04/30/23 185 lb (83.9 kg)  04/23/23 184 lb (83.5 kg)  04/09/23 185 lb 1.9 oz (84 kg)    Physical Exam Vitals reviewed.  Constitutional:      Comments: Right breast shows no masses edema or erythema.  There is no right axillary adenopathy.  Left breast really does not show any masses.  I cannot palpate any distinct mass in the left breast now.  I cannot palpate any left axilla lymph nodes.    HENT:     Head: Normocephalic and atraumatic.  Eyes:     Pupils: Pupils are equal, round, and reactive to light.  Cardiovascular:     Rate and Rhythm: Normal rate and regular rhythm.     Heart sounds: Normal heart sounds.  Pulmonary:     Effort: Pulmonary effort is normal.     Breath sounds: Normal breath sounds.  Abdominal:     General: Bowel sounds are normal.     Palpations: Abdomen is soft.  Musculoskeletal:        General: No tenderness or deformity. Normal range of motion.     Cervical back: Normal range of motion.  Lymphadenopathy:     Cervical: No cervical adenopathy.  Skin:    General: Skin is warm and dry.     Findings: No erythema or rash.  Neurological:     Mental Status: She is alert and oriented to person, place, and time.  Psychiatric:        Behavior: Behavior normal.        Thought Content: Thought content normal.        Judgment: Judgment normal.      Lab Results  Component Value Date   WBC 14.5 (H) 04/30/2023   HGB 11.4 (L) 04/30/2023   HCT 34.2 (L) 04/30/2023   MCV 84.2 04/30/2023   PLT 148 (L) 04/30/2023     Chemistry      Component Value Date/Time   NA 135 04/30/2023 0815   K 3.3 (L) 04/30/2023 0815   CL 100 04/30/2023 0815   CO2 24 04/30/2023 0815   BUN 17 04/30/2023 0815   CREATININE 0.77 04/30/2023 0815   CREATININE 0.62 10/17/2022 0947      Component Value Date/Time   CALCIUM 9.8 04/30/2023 0815   ALKPHOS 105 04/30/2023 0815   AST 18  04/30/2023 0815   ALT 35 04/30/2023 0815   BILITOT 0.2 (L) 04/30/2023 0815     Impression and Plan: Ms. Cabezas is a very nice 47 year old premenopausal Afro-American female with TRIPLE NEGATIVE breast cancer.  She has aggressive disease in my opinion.  This will be her fourth and final cycle of neoadjuvant chemoimmunotherapy.  I think she has responded well.  I cannot palpate anything in the left breast now.  I am sure  there is probably still some tumor there.  However, I would like to hope that this will be able to be resected out much more easily.  We will see about a MRI to be done in a couple weeks.  I think she sees the surgeon tomorrow.  Hopefully, she will be able to have surgery sometime in September.  Again, I think she really has responded nicely to treatment.  I am just very impressed as to how well she is tolerated therapy.  I will plan to see her back myself in about a month.  I suppose we might be able to see her back after she has her surgery.  Again I am not sure when surgery will be scheduled.   Josph Macho, MD 8/29/20249:09 AM

## 2023-04-30 NOTE — Patient Instructions (Signed)

## 2023-04-30 NOTE — Patient Instructions (Signed)
[facility]  Discharge Instructions: Thank you for choosing [facility] to provide your oncology and hematology care.   If you have a lab appointment with the Cancer Center, please go directly to the Cancer Center and check in at the registration area.  Wear comfortable clothing and clothing appropriate for easy access to any Portacath or PICC line.   We strive to give you quality time with your provider. You may need to reschedule your appointment if you arrive late (15 or more minutes).  Arriving late affects you and other patients whose appointments are after yours.  Also, if you miss three or more appointments without notifying the office, you may be dismissed from the clinic at the provider's discretion.      For prescription refill requests, have your pharmacy contact our office and allow 72 hours for refills to be completed.    Today you received the following chemotherapy and/or immunotherapy agents Taxol.   To help prevent nausea and vomiting after your treatment, we encourage you to take your nausea medication as directed.  BELOW ARE SYMPTOMS THAT SHOULD BE REPORTED IMMEDIATELY: *FEVER GREATER THAN 100.4 F (38 C) OR HIGHER *CHILLS OR SWEATING *NAUSEA AND VOMITING THAT IS NOT CONTROLLED WITH YOUR NAUSEA MEDICATION *UNUSUAL SHORTNESS OF BREATH *UNUSUAL BRUISING OR BLEEDING *URINARY PROBLEMS (pain or burning when urinating, or frequent urination) *BOWEL PROBLEMS (unusual diarrhea, constipation, pain near the anus) TENDERNESS IN MOUTH AND THROAT WITH OR WITHOUT PRESENCE OF ULCERS (sore throat, sores in mouth, or a toothache) UNUSUAL RASH, SWELLING OR PAIN  UNUSUAL VAGINAL DISCHARGE OR ITCHING   Items with * indicate a potential emergency and should be followed up as soon as possible or go to the Emergency Department if any problems should occur.  Please show the CHEMOTHERAPY ALERT CARD or IMMUNOTHERAPY ALERT CARD at check-in to  the Emergency Department and triage nurse. Should you have questions after your visit or need to cancel or reschedule your appointment, please contact [facility]  [phone] and follow the prompts.  Office hours are 8:00 a.m. to 4:30 p.m. Monday - Friday. Please note that voicemails left after 4:00 p.m. may not be returned until the following business day.  We are closed weekends and major holidays. You have access to a nurse at all times for urgent questions. Please call the main number to the clinic [phone] and follow the prompts.  For any non-urgent questions, you may also contact your provider using MyChart. We now offer e-Visits for anyone 18 and older to request care online for non-urgent symptoms. For details visit [email or web address].   Also download the MyChart app! Go to the app store, search "MyChart", open the app, select , and log in with your MyChart username and password.   

## 2023-05-01 LAB — CANCER ANTIGEN 27.29: CA 27.29: 23.3 U/mL (ref 0.0–38.6)

## 2023-05-06 ENCOUNTER — Ambulatory Visit (HOSPITAL_COMMUNITY)
Admission: RE | Admit: 2023-05-06 | Discharge: 2023-05-06 | Disposition: A | Discharge status: Home / Self Care | Payer: No Typology Code available for payment source | Source: Ambulatory Visit | Attending: Hematology & Oncology | Admitting: Hematology & Oncology

## 2023-05-06 ENCOUNTER — Encounter: Payer: Self-pay | Admitting: *Deleted

## 2023-05-06 DIAGNOSIS — C50912 Malignant neoplasm of unspecified site of left female breast: Secondary | ICD-10-CM | POA: Insufficient documentation

## 2023-05-06 DIAGNOSIS — C773 Secondary and unspecified malignant neoplasm of axilla and upper limb lymph nodes: Secondary | ICD-10-CM | POA: Diagnosis present

## 2023-05-06 MED ORDER — GADOBUTROL 1 MMOL/ML IV SOLN
8.0000 mL | Freq: Once | INTRAVENOUS | Status: AC | PRN
  Administered 2023-05-06: 8 mL via INTRAVENOUS

## 2023-05-07 ENCOUNTER — Encounter: Payer: Self-pay | Admitting: *Deleted

## 2023-05-07 NOTE — Progress Notes (Signed)
Reviewed MRI which shows excellent treatment response.   Oncology Nurse Navigator Documentation     05/07/2023   11:15 AM  Oncology Nurse Navigator Flowsheets  Navigator Follow Up Date: 05/28/2023  Navigator Follow Up Reason: Follow-up Appointment  Navigator Location [facility]  Navigator Encounter Type Scan Review  Patient Visit Type MedOnc  Treatment Phase Active Tx  Barriers/Navigation Needs Coordination of Care;Education  Interventions None Required  Acuity Level 2-Minimal Needs (1-2 Barriers Identified)  Support Groups/Services Friends and Family  Time Spent with Patient 15

## 2023-05-12 ENCOUNTER — Other Ambulatory Visit: Payer: Self-pay | Admitting: Surgery

## 2023-05-15 NOTE — Progress Notes (Signed)
The 10-year ASCVD risk score (Arnett DK, et al., 2019) is: 3.6%   Values used to calculate the score:     Age: 47 years     Sex: Female     Is Non-Hispanic African American: Yes     Diabetic: No     Tobacco smoker: No     Systolic Blood Pressure: 142 mmHg     Is BP treated: Yes     HDL Cholesterol: 52 mg/dL     Total Cholesterol: 180 mg/dL  Sandie Ano, RN

## 2023-05-18 ENCOUNTER — Other Ambulatory Visit: Payer: Self-pay | Admitting: Surgery

## 2023-05-18 DIAGNOSIS — C773 Secondary and unspecified malignant neoplasm of axilla and upper limb lymph nodes: Secondary | ICD-10-CM

## 2023-05-19 ENCOUNTER — Other Ambulatory Visit: Payer: Self-pay | Admitting: Surgery

## 2023-05-19 DIAGNOSIS — C773 Secondary and unspecified malignant neoplasm of axilla and upper limb lymph nodes: Secondary | ICD-10-CM

## 2023-05-20 ENCOUNTER — Encounter: Payer: Self-pay | Admitting: Pharmacist

## 2023-05-28 ENCOUNTER — Inpatient Hospital Stay: Payer: No Typology Code available for payment source

## 2023-05-28 ENCOUNTER — Encounter: Payer: Self-pay | Admitting: *Deleted

## 2023-05-28 ENCOUNTER — Inpatient Hospital Stay: Payer: No Typology Code available for payment source | Admitting: Hematology & Oncology

## 2023-05-28 ENCOUNTER — Inpatient Hospital Stay: Payer: No Typology Code available for payment source | Attending: Hematology & Oncology

## 2023-05-28 NOTE — Progress Notes (Signed)
Patient scheduled for surgery on 06/08/2023.  Oncology Nurse Navigator Documentation     05/28/2023    9:00 AM  Oncology Nurse Navigator Flowsheets  Navigator Follow Up Date: 06/08/2023  Navigator Follow Up Reason: Surgery  Navigator Location [facility]  Navigator Encounter Type Appt/Treatment Plan Review  Patient Visit Type MedOnc  Treatment Phase Active Tx  Barriers/Navigation Needs Coordination of Care;Education  Interventions None Required  Acuity Level 2-Minimal Needs (1-2 Barriers Identified)  Support Groups/Services Friends and Family  Time Spent with Patient 15

## 2023-05-29 ENCOUNTER — Ambulatory Visit: Payer: No Typology Code available for payment source | Admitting: Family Medicine

## 2023-06-02 ENCOUNTER — Encounter (HOSPITAL_BASED_OUTPATIENT_CLINIC_OR_DEPARTMENT_OTHER): Payer: Self-pay | Admitting: Surgery

## 2023-06-02 ENCOUNTER — Other Ambulatory Visit: Payer: Self-pay

## 2023-06-03 NOTE — H&P (Signed)
Subjective Patient ID: Briana [name] is a 47 y.o. female.     HPI   Returns for follow up discussion breast reconstruction. Presented with palpable left breast mass. Diagnostic MMG/US showed left UOQ 6.4 x 5.4 cm mass. Distortion posterior to the mass associated with calcifications. Calcifications extend anterior to the mass as well, and there is asymmetrically dense tissue extending from the mass to the base of the nipple. The mass, calcifications and asymmetry span approximately 12 cm. A separate 8 mm group of calcifications in the central mid depth of the left breast. [place] left axilla demonstrated  at least 4 markedly abnormal LN. Biopsies labeled left breast 1 o clock showed triple negative IDC, LN positive for metastatic disease.    MRI showed lobular mass within the lateral/upper outer left breast compatible with biopsy-proven malignancy. NME surrounds mass and extends anterior to the level of the NAC as well as medially to involve the anterior upper inner quadrant of the left breast. In total these findings measure 12.3 x 9.7 cm. Additionally there is a 7 mm oval enhancing mass within the anterior lower inner left breast. Overall disease burden likely involves 3 quadrants of the left breast. Note however that there has only been 1 biopsy of the large mass within the upper-outer left breast.Extensive metastatic left axillary adenopathy present.    PET scan demonstrated hypermetabolic left axillary, left subpectoral and left supraclavicular LN, consistent with nodal metastatic disease; no distant disease noted.   Completed neoadjuvant chemotherapy. Final MRI showed persistent NME left UOQ  spanning 8.1 x 5.2 x 4.0 cm. Edema in the left lower axilla noted. "Although no enlarged nodes are identified, the axillary regions are not included on this exam."   She will receive adjuvant RT.   No genetic testing to date.   Current 40 DD. Wt stable   PMH significant for HIV followed by [facility].  Undetectable viral load since 1996.   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 Lives with teenage daughter. Has 3 other adult children- two in area. Mother also in area to assist with post operative care.    Review of Systems  Skin:  Positive for rash.  Allergic/Immunologic: Positive for food allergies.  All other systems reviewed and are negative.         Objective Physical Exam  Cardiovascular: Normal rate, regular rhythm and normal heart sounds.    Pulmonary/Chest Effort normal and breath sounds normal.    Skin   Fitzpatrick 6    Breasts: grade 3 ptosis bilateral, rash beneath breasts Sn to nipple R 29 L 30 cm BW R 24 L 24 cm Nipple to IMF R 13 L 15 cm       Assessment/Plan Left breast cancer metastasized to lymph node Neoadjuvant chemotherapy   Plan left mastectomy with immediate tissue expander acellular dermis reconstruction.    Reviewed options prosthesis and provided brochure for [facility]. Discussed immediate vs delayed, autologous vs implant based reconstruction. Reviewed incisions, drains, OR length, hospital stay and recovery for each. Discussed process of expansion and implant based risks including rupture, MRI surveillance for silicone implants, infection requiring surgery or removal, contracture. Discussed asymmetry one can expect with implant unilateral and natural breast on opposite. Discussed TRAM vs DIEP, latter would require treatment at tertiary center. Discussed abdominal based complications including failure flap, abdominal bulge or hernia. Discussed future surgery dependent on adjuvant treatments. This includes RT. Reviewed timing of future surgery 3-6 mo  from end RT. At that time can elect for implant exchange alone, implant exchange with LD flap over left, or consider referral to microsurgeon to discuss purely autologous options. Reviewed increased risks reconstruction in setting RT including wound healing problems  and capsular contracture.   Reviewed reconstruction will be asensate and not stimulate. Reviewed with both risks mastectomy flap necrosis requiring additional surgery. Given breast size and ptosis if she proceeds with immediate reconstruction recommend SRM. Reviewed anchor type scar.   Discussed use of acellular dermis in reconstruction, cadaveric source, incorporation over several weeks, risk that if has seroma or infection can act as additional nidus for infection if not incorporated. Reviewed this is off label use of ADM.   Discussed prepectoral vs sub pectoral reconstruction. Discussed with patient and benefit of this is no animation deformity, may be less pain. Risk may be more visible rippling over upper poles, greater need of ADM. Reviewed pre pectoral would require larger amount acellular dermis, more drains. Discussed any type reconstruction also risks long term displacement implant and visible rippling. If prepectoral counseled I would recommend she be comfortable with silicone implants as more options that have less rippling. She agrees to prepectoral placement.   Additional risks including but not limited to bleeding, seroma, hematoma, damage to adjacent structures, infection, asymmetry, damage to adjacent structures, need for additional procedures, unacceptable cosmetic result, blood clots in legs or lungs reviewed.    Drain teaching completed. Rx for tramadol bactrim and robaxin given.   Glenna Fellows, MD [facility] Plastic & Reconstructive Surgery  Office/ physician access line after hours [phone]

## 2023-06-04 ENCOUNTER — Ambulatory Visit
Admission: RE | Admit: 2023-06-04 | Discharge: 2023-06-04 | Disposition: A | Discharge status: Home / Self Care | Payer: No Typology Code available for payment source | Source: Ambulatory Visit | Attending: Surgery | Admitting: Surgery

## 2023-06-04 ENCOUNTER — Other Ambulatory Visit: Payer: Self-pay | Admitting: Surgery

## 2023-06-04 DIAGNOSIS — C773 Secondary and unspecified malignant neoplasm of axilla and upper limb lymph nodes: Secondary | ICD-10-CM

## 2023-06-04 HISTORY — PX: BREAST BIOPSY: SHX20

## 2023-06-05 ENCOUNTER — Encounter (HOSPITAL_BASED_OUTPATIENT_CLINIC_OR_DEPARTMENT_OTHER)
Admission: RE | Admit: 2023-06-05 | Discharge: 2023-06-05 | Disposition: A | Discharge status: Home / Self Care | Payer: No Typology Code available for payment source | Source: Ambulatory Visit | Attending: Plastic Surgery

## 2023-06-05 DIAGNOSIS — Z79899 Other long term (current) drug therapy: Secondary | ICD-10-CM | POA: Insufficient documentation

## 2023-06-05 DIAGNOSIS — Z01818 Encounter for other preprocedural examination: Secondary | ICD-10-CM | POA: Insufficient documentation

## 2023-06-05 LAB — BASIC METABOLIC PANEL
Anion gap: 9 (ref 5–15)
BUN: 10 mg/dL (ref 6–20)
CO2: 27 mmol/L (ref 22–32)
Calcium: 9.9 mg/dL (ref 8.9–10.3)
Chloride: 99 mmol/L (ref 98–111)
Creatinine, Ser: 0.93 mg/dL (ref 0.44–1.00)
GFR, Estimated: >60 mL/min (ref >60)
Glucose, Bld: 90 mg/dL (ref 70–99)
Potassium: 3.4 mmol/L — ABNORMAL LOW (ref 3.5–5.1)
Sodium: 135 mmol/L (ref 135–145)

## 2023-06-05 MED ORDER — CHLORHEXIDINE GLUCONATE CLOTH 2 % EX PADS: 6.0000 | MEDICATED_PAD | Freq: Once | CUTANEOUS | Status: DC

## 2023-06-05 MED ORDER — ENSURE PRE-SURGERY PO LIQD: 296.0000 mL | Freq: Once | ORAL | Status: DC

## 2023-06-05 NOTE — Progress Notes (Signed)

## 2023-06-07 NOTE — H&P (Signed)
PROVIDER: Wayne Both, MD  MRN: [id number] DOB: 03-Dec-1975  Subjective   Chief Complaint: Follow-up (LEFT breast IMC, LEFT Lymph node metastatic carcinoma)   History of Present Illness: Briana [name] is a 47 y.o. female who is seen  for a follow-up regarding her left breast cancer. She has been receiving neoadjuvant chemotherapy. She reports she is doing well and has had normal symptoms from her chemotherapy..    Review of Systems: A complete review of systems was obtained from the patient. I have reviewed this information and discussed as appropriate with the patient. See HPI as well for other ROS.  ROS   Medical History: Past Medical History:  Diagnosis Date  Anemia   There is no problem list on file for this patient.  Past Surgical History:  Procedure Laterality Date  CESAREAN SECTION  01/12/2007    Allergies  Allergen Reactions  Pumpkin Flavor [Flavoring Agent (Bulk)] Other (See Comments)  LIPS SWELL   Current Outpatient Medications on File Prior to Visit  Medication Sig Dispense Refill  amLODIPine (NORVASC) 5 MG tablet Take 5 mg by mouth once daily  BIKTARVY 50-200-25 mg tablet Take 1 tablet by mouth once daily  ferrous sulfate 325 (65 FE) MG EC tablet Take 325 mg by mouth daily with breakfast  hydroCHLOROthiazide (HYDRODIURIL) 12.5 MG tablet Take 12.5 mg by mouth once daily  predniSONE (DELTASONE) 10 MG tablet Take 10 mg by mouth daily with breakfast   No current facility-administered medications on file prior to visit.   History reviewed. No pertinent family history.   Social History   Tobacco Use  Smoking Status Never  Smokeless Tobacco Never    Social History   Socioeconomic History  Marital status: Single  Tobacco Use  Smoking status: Never  Smokeless tobacco: Never  Vaping Use  Vaping status: Never Used  Substance and Sexual Activity  Alcohol use: Yes  Drug use: Never   Social Determinants of Health   Food Insecurity: No  Food Insecurity (01/23/2023)  Received from [facility]  Hunger Vital Sign  Worried About Running Out of Food in the Last Year: Never true  Ran Out of Food in the Last Year: Never true  Transportation Needs: No Transportation Needs (01/23/2023)  Received from [facility] - Transportation  Lack of Transportation (Medical): No  Lack of Transportation (Non-Medical): No   Objective:   Vitals:   PainSc: 0-No pain   There is no height or weight on file to calculate BMI.  Physical Exam   She appears well on exam  I can no longer palpate any mass in her left breast and there are no enlarged lymph nodes in the left axilla  Labs, Imaging and Diagnostic Testing:  MRI is pending  Assessment and Plan:   Diagnoses and all orders for this visit:  Invasive ductal carcinoma of breast, left (CMS/HHS-HCC) - Ambulatory Referral to Plastic Surgery    At this point, clinically, she has had an excellent response to chemotherapy. She is scheduled for MRI and I will call her back with the results. We will then determine next course of action. We will go ahead tentatively refer her to plastic surgery as we still may need to do a mastectomy with reconstruction. She understands and agrees with the plans.   Addendum: MRI was performed and she had a significant response to chemotherapy but still has non-mass like enhancement measuring at least 8 cm.  A left mastectomy recommended as well as a targeted lymph node dissection.  She has seen plastic surgery preop and reconstruction is planned. I discussed the risks of surgery which includes but is not limited to bleeding, infection, injury to surrounding structures, arm swelling or numbness, the need for further procedures, cardiopulmonary issues, etc.  She agrees to proceed.

## 2023-06-08 ENCOUNTER — Encounter (HOSPITAL_BASED_OUTPATIENT_CLINIC_OR_DEPARTMENT_OTHER): Payer: Self-pay | Admitting: Surgery

## 2023-06-08 ENCOUNTER — Ambulatory Visit
Admission: RE | Admit: 2023-06-08 | Discharge: 2023-06-08 | Disposition: A | Discharge status: Home / Self Care | Payer: No Typology Code available for payment source | Source: Ambulatory Visit | Attending: Surgery | Admitting: Surgery

## 2023-06-08 ENCOUNTER — Other Ambulatory Visit: Payer: Self-pay

## 2023-06-08 ENCOUNTER — Ambulatory Visit (HOSPITAL_BASED_OUTPATIENT_CLINIC_OR_DEPARTMENT_OTHER)
Admission: RE | Admit: 2023-06-08 | Discharge: 2023-06-09 | Disposition: A | Discharge status: Home / Self Care | Payer: No Typology Code available for payment source | Attending: Plastic Surgery | Admitting: Plastic Surgery

## 2023-06-08 ENCOUNTER — Encounter (HOSPITAL_BASED_OUTPATIENT_CLINIC_OR_DEPARTMENT_OTHER)
Admission: RE | Disposition: A | Discharge status: Home / Self Care | Payer: Self-pay | Source: Home / Self Care | Attending: Plastic Surgery

## 2023-06-08 ENCOUNTER — Ambulatory Visit (HOSPITAL_BASED_OUTPATIENT_CLINIC_OR_DEPARTMENT_OTHER): Payer: Self-pay | Admitting: Anesthesiology

## 2023-06-08 ENCOUNTER — Ambulatory Visit (HOSPITAL_BASED_OUTPATIENT_CLINIC_OR_DEPARTMENT_OTHER): Payer: No Typology Code available for payment source | Admitting: Anesthesiology

## 2023-06-08 ENCOUNTER — Encounter: Payer: Self-pay | Admitting: *Deleted

## 2023-06-08 DIAGNOSIS — C50912 Malignant neoplasm of unspecified site of left female breast: Secondary | ICD-10-CM | POA: Diagnosis present

## 2023-06-08 DIAGNOSIS — Z79899 Other long term (current) drug therapy: Secondary | ICD-10-CM | POA: Diagnosis not present

## 2023-06-08 DIAGNOSIS — C50919 Malignant neoplasm of unspecified site of unspecified female breast: Secondary | ICD-10-CM | POA: Diagnosis present

## 2023-06-08 DIAGNOSIS — C773 Secondary and unspecified malignant neoplasm of axilla and upper limb lymph nodes: Secondary | ICD-10-CM

## 2023-06-08 DIAGNOSIS — Z01818 Encounter for other preprocedural examination: Secondary | ICD-10-CM

## 2023-06-08 DIAGNOSIS — N189 Chronic kidney disease, unspecified: Secondary | ICD-10-CM

## 2023-06-08 DIAGNOSIS — I129 Hypertensive chronic kidney disease with stage 1 through stage 4 chronic kidney disease, or unspecified chronic kidney disease: Secondary | ICD-10-CM

## 2023-06-08 DIAGNOSIS — Z21 Asymptomatic human immunodeficiency virus [HIV] infection status: Secondary | ICD-10-CM | POA: Insufficient documentation

## 2023-06-08 DIAGNOSIS — I10 Essential (primary) hypertension: Secondary | ICD-10-CM | POA: Insufficient documentation

## 2023-06-08 DIAGNOSIS — C50412 Malignant neoplasm of upper-outer quadrant of left female breast: Secondary | ICD-10-CM | POA: Diagnosis not present

## 2023-06-08 HISTORY — PX: BREAST RECONSTRUCTION WITH PLACEMENT OF TISSUE EXPANDER AND ALLODERM: SHX6805

## 2023-06-08 HISTORY — PX: MASTECTOMY WITH AXILLARY LYMPH NODE DISSECTION: SHX5661

## 2023-06-08 LAB — POCT PREGNANCY, URINE: Preg Test, Ur: NEGATIVE

## 2023-06-08 SURGERY — MASTECTOMY WITH AXILLARY LYMPH NODE DISSECTION
Anesthesia: General | Site: Breast | Laterality: Left

## 2023-06-08 MED ORDER — HYDROCHLOROTHIAZIDE 12.5 MG PO TABS
12.5000 mg | ORAL_TABLET | Freq: Every day | ORAL | Status: DC
  Administered 2023-06-08: 12.5 mg via ORAL
  Filled 2023-06-08: qty 1

## 2023-06-08 MED ORDER — HYDROMORPHONE HCL 1 MG/ML IJ SOLN
0.2500 mg | INTRAMUSCULAR | Status: DC | PRN
  Administered 2023-06-08 (×2): 0.5 mg via INTRAVENOUS

## 2023-06-08 MED ORDER — LIDOCAINE 2% (20 MG/ML) 5 ML SYRINGE
INTRAMUSCULAR | Status: DC | PRN
  Administered 2023-06-08: 60 mg via INTRAVENOUS

## 2023-06-08 MED ORDER — ACETAMINOPHEN 500 MG PO TABS
1000.0000 mg | ORAL_TABLET | ORAL | Status: AC
  Administered 2023-06-08: 1000 mg via ORAL

## 2023-06-08 MED ORDER — SODIUM CHLORIDE 0.9 % IV SOLN
INTRAVENOUS | Status: AC
  Filled 2023-06-08: qty 10

## 2023-06-08 MED ORDER — HYDROMORPHONE HCL 1 MG/ML IJ SOLN
INTRAMUSCULAR | Status: AC
  Filled 2023-06-08: qty 0.5

## 2023-06-08 MED ORDER — ONDANSETRON HCL 4 MG/2ML IJ SOLN
INTRAMUSCULAR | Status: AC
  Filled 2023-06-08: qty 2

## 2023-06-08 MED ORDER — PROPOFOL 10 MG/ML IV BOLUS
INTRAVENOUS | Status: AC
  Filled 2023-06-08: qty 20

## 2023-06-08 MED ORDER — LIDOCAINE 2% (20 MG/ML) 5 ML SYRINGE
INTRAMUSCULAR | Status: AC
  Filled 2023-06-08: qty 5

## 2023-06-08 MED ORDER — ENOXAPARIN SODIUM 40 MG/0.4ML IJ SOSY
40.0000 mg | PREFILLED_SYRINGE | INTRAMUSCULAR | Status: DC
  Administered 2023-06-09: 40 mg via SUBCUTANEOUS
  Filled 2023-06-08: qty 0.4

## 2023-06-08 MED ORDER — FENTANYL CITRATE (PF) 100 MCG/2ML IJ SOLN
INTRAMUSCULAR | Status: AC
  Filled 2023-06-08: qty 2

## 2023-06-08 MED ORDER — KETAMINE HCL 50 MG/5ML IJ SOSY
PREFILLED_SYRINGE | INTRAMUSCULAR | Status: DC | PRN
Start: 2023-06-08 — End: 2023-06-08
  Administered 2023-06-08 (×2): 20 mg via INTRAVENOUS
  Administered 2023-06-08: 10 mg via INTRAVENOUS

## 2023-06-08 MED ORDER — TRAMADOL HCL 50 MG PO TABS: 50.0000 mg | ORAL_TABLET | Freq: Four times a day (QID) | ORAL | Status: DC | PRN

## 2023-06-08 MED ORDER — LACTATED RINGERS IV SOLN: INTRAVENOUS | Status: DC

## 2023-06-08 MED ORDER — MIDAZOLAM HCL 2 MG/2ML IJ SOLN
INTRAMUSCULAR | Status: AC
  Filled 2023-06-08: qty 2

## 2023-06-08 MED ORDER — TRANEXAMIC ACID 1000 MG/10ML IV SOLN
INTRAVENOUS | Status: AC
  Filled 2023-06-08: qty 30

## 2023-06-08 MED ORDER — POTASSIUM CHLORIDE CRYS ER 20 MEQ PO TBCR
40.0000 meq | EXTENDED_RELEASE_TABLET | Freq: Two times a day (BID) | ORAL | Status: DC
  Administered 2023-06-08: 40 meq via ORAL
  Filled 2023-06-08: qty 2

## 2023-06-08 MED ORDER — ONDANSETRON HCL 4 MG/2ML IJ SOLN: 4.0000 mg | Freq: Four times a day (QID) | INTRAMUSCULAR | Status: DC | PRN

## 2023-06-08 MED ORDER — KETOROLAC TROMETHAMINE 30 MG/ML IJ SOLN
30.0000 mg | Freq: Three times a day (TID) | INTRAMUSCULAR | Status: DC
  Administered 2023-06-08 – 2023-06-09 (×2): 30 mg via INTRAVENOUS
  Filled 2023-06-08 (×2): qty 1

## 2023-06-08 MED ORDER — METHOCARBAMOL 500 MG PO TABS
500.0000 mg | ORAL_TABLET | Freq: Four times a day (QID) | ORAL | Status: DC | PRN
  Administered 2023-06-08: 500 mg via ORAL
  Filled 2023-06-08: qty 1

## 2023-06-08 MED ORDER — MIDAZOLAM HCL 2 MG/2ML IJ SOLN
2.0000 mg | Freq: Once | INTRAMUSCULAR | Status: AC
  Administered 2023-06-08: 2 mg via INTRAVENOUS

## 2023-06-08 MED ORDER — POVIDONE-IODINE 10 % EX SOLN
CUTANEOUS | Status: DC | PRN
  Administered 2023-06-08: 1 via TOPICAL

## 2023-06-08 MED ORDER — SODIUM CHLORIDE 0.9 % IV SOLN: 12.5000 mg | INTRAVENOUS | Status: DC | PRN

## 2023-06-08 MED ORDER — ACETAMINOPHEN 500 MG PO TABS
ORAL_TABLET | ORAL | Status: AC
  Filled 2023-06-08: qty 2

## 2023-06-08 MED ORDER — OXYCODONE HCL 5 MG PO TABS
5.0000 mg | ORAL_TABLET | Freq: Once | ORAL | Status: DC | PRN
  Filled 2023-06-08: qty 1

## 2023-06-08 MED ORDER — ONDANSETRON 4 MG PO TBDP: 4.0000 mg | ORAL_TABLET | Freq: Four times a day (QID) | ORAL | Status: DC | PRN

## 2023-06-08 MED ORDER — CEFAZOLIN SODIUM-DEXTROSE 2-4 GM/100ML-% IV SOLN
INTRAVENOUS | Status: AC
  Filled 2023-06-08: qty 100

## 2023-06-08 MED ORDER — CEFAZOLIN SODIUM-DEXTROSE 2-4 GM/100ML-% IV SOLN
2.0000 g | INTRAVENOUS | Status: AC
  Administered 2023-06-08: 2 g via INTRAVENOUS

## 2023-06-08 MED ORDER — DEXAMETHASONE SODIUM PHOSPHATE 10 MG/ML IJ SOLN
INTRAMUSCULAR | Status: DC | PRN
  Administered 2023-06-08: 8 mg via INTRAVENOUS

## 2023-06-08 MED ORDER — KETAMINE HCL 50 MG/5ML IJ SOSY
PREFILLED_SYRINGE | INTRAMUSCULAR | Status: AC
  Filled 2023-06-08: qty 5

## 2023-06-08 MED ORDER — AMLODIPINE BESYLATE 10 MG PO TABS
10.0000 mg | ORAL_TABLET | Freq: Every day | ORAL | Status: DC
  Filled 2023-06-08: qty 1

## 2023-06-08 MED ORDER — CEFAZOLIN SODIUM-DEXTROSE 2-4 GM/100ML-% IV SOLN
2.0000 g | Freq: Three times a day (TID) | INTRAVENOUS | Status: DC
  Administered 2023-06-08 – 2023-06-09 (×2): 2 g via INTRAVENOUS
  Filled 2023-06-08 (×2): qty 100

## 2023-06-08 MED ORDER — MEPERIDINE HCL 25 MG/ML IJ SOLN: 6.2500 mg | INTRAMUSCULAR | Status: DC | PRN

## 2023-06-08 MED ORDER — SODIUM CHLORIDE 0.9 % IV SOLN
INTRAVENOUS | Status: DC | PRN
  Administered 2023-06-08: 500 mL

## 2023-06-08 MED ORDER — FENTANYL CITRATE (PF) 250 MCG/5ML IJ SOLN
INTRAMUSCULAR | Status: DC | PRN
  Administered 2023-06-08: 25 ug via INTRAVENOUS
  Administered 2023-06-08: 100 ug via INTRAVENOUS
  Administered 2023-06-08 (×3): 25 ug via INTRAVENOUS

## 2023-06-08 MED ORDER — DEXAMETHASONE SODIUM PHOSPHATE 10 MG/ML IJ SOLN
INTRAMUSCULAR | Status: AC
  Filled 2023-06-08: qty 1

## 2023-06-08 MED ORDER — AMISULPRIDE (ANTIEMETIC) 5 MG/2ML IV SOLN: 10.0000 mg | Freq: Once | INTRAVENOUS | Status: DC | PRN

## 2023-06-08 MED ORDER — OXYCODONE HCL 5 MG PO TABS: 5.0000 mg | ORAL_TABLET | ORAL | Status: DC | PRN

## 2023-06-08 MED ORDER — OXYCODONE HCL 5 MG/5ML PO SOLN: 5.0000 mg | Freq: Once | ORAL | Status: DC | PRN

## 2023-06-08 MED ORDER — BICTEGRAVIR-EMTRICITAB-TENOFOV 50-200-25 MG PO TABS
1.0000 | ORAL_TABLET | Freq: Every day | ORAL | Status: DC
  Filled 2023-06-08: qty 1

## 2023-06-08 MED ORDER — ROPIVACAINE HCL 5 MG/ML IJ SOLN
INTRAMUSCULAR | Status: DC | PRN
Start: 2023-06-08 — End: 2023-06-08
  Administered 2023-06-08: 30 mL via PERINEURAL

## 2023-06-08 MED ORDER — HYDROMORPHONE HCL 1 MG/ML IJ SOLN: 0.5000 mg | INTRAMUSCULAR | Status: DC | PRN

## 2023-06-08 MED ORDER — TRANEXAMIC ACID 1000 MG/10ML IV SOLN
Status: DC | PRN
  Administered 2023-06-08: 3000 mg via TOPICAL

## 2023-06-08 MED ORDER — PROPOFOL 10 MG/ML IV BOLUS
INTRAVENOUS | Status: DC | PRN
  Administered 2023-06-08: 200 mg via INTRAVENOUS

## 2023-06-08 MED ORDER — FENTANYL CITRATE (PF) 100 MCG/2ML IJ SOLN
100.0000 ug | Freq: Once | INTRAMUSCULAR | Status: AC
  Administered 2023-06-08: 100 ug via INTRAVENOUS

## 2023-06-08 MED ORDER — ONDANSETRON HCL 4 MG/2ML IJ SOLN
INTRAMUSCULAR | Status: DC | PRN
  Administered 2023-06-08: 4 mg via INTRAVENOUS

## 2023-06-08 SURGICAL SUPPLY — 89 items
ADH SKN CLS APL DERMABOND .7 (GAUZE/BANDAGES/DRESSINGS) ×2
ALLOGRAFT PERF 16X20 1.6+/-0.4 (Tissue) IMPLANT
APL PRP STRL LF DISP 70% ISPRP (MISCELLANEOUS) ×2
APPLIER CLIP 9.375 MED OPEN (MISCELLANEOUS) ×1
APR CLP MED 9.3 20 MLT OPN (MISCELLANEOUS) ×1
BAG DECANTER FOR FLEXI CONT (MISCELLANEOUS) ×1 IMPLANT
BINDER BREAST LRG (GAUZE/BANDAGES/DRESSINGS) IMPLANT
BINDER BREAST MEDIUM (GAUZE/BANDAGES/DRESSINGS) IMPLANT
BINDER BREAST XLRG (GAUZE/BANDAGES/DRESSINGS) IMPLANT
BINDER BREAST XXLRG (GAUZE/BANDAGES/DRESSINGS) IMPLANT
BLADE SURG 10 STRL SS (BLADE) ×1 IMPLANT
BLADE SURG 15 STRL LF DISP TIS (BLADE) ×2 IMPLANT
BLADE SURG 15 STRL SS (BLADE) ×2
BNDG GAUZE DERMACEA FLUFF 4 (GAUZE/BANDAGES/DRESSINGS) IMPLANT
BNDG GZE DERMACEA 4 6PLY (GAUZE/BANDAGES/DRESSINGS)
CANISTER SUCT 1200ML W/VALVE (MISCELLANEOUS) ×1 IMPLANT
CHLORAPREP W/TINT 26 (MISCELLANEOUS) ×1 IMPLANT
CLIP APPLIE 9.375 MED OPEN (MISCELLANEOUS) ×1 IMPLANT
COVER BACK TABLE 60X90IN (DRAPES) ×1 IMPLANT
COVER MAYO STAND STRL (DRAPES) ×1 IMPLANT
COVER PROBE W GEL 5X96 (DRAPES) IMPLANT
DERMABOND ADVANCED .7 DNX12 (GAUZE/BANDAGES/DRESSINGS) ×1 IMPLANT
DRAIN CHANNEL 15F RND FF W/TCR (WOUND CARE) IMPLANT
DRAIN CHANNEL 19F RND (DRAIN) ×2 IMPLANT
DRAPE INCISE IOBAN 66X45 STRL (DRAPES) IMPLANT
DRAPE LAPAROSCOPIC ABDOMINAL (DRAPES) ×1 IMPLANT
DRAPE TOP ARMCOVERS (MISCELLANEOUS) ×1 IMPLANT
DRAPE U-SHAPE 76X120 STRL (DRAPES) ×1 IMPLANT
DRAPE UTILITY XL STRL (DRAPES) ×1 IMPLANT
DRSG TEGADERM 2-3/8X2-3/4 SM (GAUZE/BANDAGES/DRESSINGS) IMPLANT
DRSG TEGADERM 4X10 (GAUZE/BANDAGES/DRESSINGS) IMPLANT
ELECT BLADE 4.0 EZ CLEAN MEGAD (MISCELLANEOUS) ×1
ELECT COATED BLADE 2.86 ST (ELECTRODE) ×1 IMPLANT
ELECT REM PT RETURN 9FT ADLT (ELECTROSURGICAL) ×1
ELECTRODE BLDE 4.0 EZ CLN MEGD (MISCELLANEOUS) ×1 IMPLANT
ELECTRODE REM PT RTRN 9FT ADLT (ELECTROSURGICAL) ×1 IMPLANT
EVACUATOR SILICONE 100CC (DRAIN) ×2 IMPLANT
EXPANDER TISSUE FV FOURTE 500 (Prosthesis & Implant Plastic) IMPLANT
GAUZE PAD ABD 8X10 STRL (GAUZE/BANDAGES/DRESSINGS) ×2 IMPLANT
GLOVE BIO SURGEON STRL SZ 6 (GLOVE) ×1 IMPLANT
GLOVE BIOGEL PI IND STRL 7.0 (GLOVE) IMPLANT
GLOVE BIOGEL PI IND STRL 7.5 (GLOVE) IMPLANT
GLOVE SURG SIGNA 7.5 PF LTX (GLOVE) ×1 IMPLANT
GLOVE SURG SS PI 7.0 STRL IVOR (GLOVE) IMPLANT
GOWN STRL REUS W/ TWL LRG LVL3 (GOWN DISPOSABLE) ×2 IMPLANT
GOWN STRL REUS W/ TWL XL LVL3 (GOWN DISPOSABLE) ×1 IMPLANT
GOWN STRL REUS W/TWL LRG LVL3 (GOWN DISPOSABLE) ×2
GOWN STRL REUS W/TWL XL LVL3 (GOWN DISPOSABLE) ×1
HEMOSTAT SURGICEL 2X14 (HEMOSTASIS) IMPLANT
MARKER SKIN DUAL TIP RULER LAB (MISCELLANEOUS) IMPLANT
NDL HYPO 25X1 1.5 SAFETY (NEEDLE) ×1 IMPLANT
NDL SAFETY ECLIPSE 18X1.5 (NEEDLE) ×1 IMPLANT
NEEDLE HYPO 25X1 1.5 SAFETY (NEEDLE) ×1 IMPLANT
NS IRRIG 1000ML POUR BTL (IV SOLUTION) ×1 IMPLANT
PACK BASIN DAY SURGERY FS (CUSTOM PROCEDURE TRAY) ×1 IMPLANT
PENCIL SMOKE EVACUATOR (MISCELLANEOUS) ×1 IMPLANT
PIN SAFETY STERILE (MISCELLANEOUS) ×1 IMPLANT
PUNCH BIOPSY 4MM DISP (MISCELLANEOUS) IMPLANT
SHEET MEDIUM DRAPE 40X70 STRL (DRAPES) ×1 IMPLANT
SLEEVE SCD COMPRESS KNEE MED (STOCKING) ×1 IMPLANT
SPIKE FLUID TRANSFER (MISCELLANEOUS) IMPLANT
SPONGE T-LAP 18X18 ~~LOC~~+RFID (SPONGE) ×2 IMPLANT
SPONGE T-LAP 4X18 ~~LOC~~+RFID (SPONGE) ×2 IMPLANT
STAPLER VISISTAT 35W (STAPLE) ×1 IMPLANT
SUT CHROMIC 4 0 PS 2 18 (SUTURE) IMPLANT
SUT CHROMIC 4 0 RB 1X27 (SUTURE) IMPLANT
SUT ETHILON 2 0 FS 18 (SUTURE) ×1 IMPLANT
SUT MNCRL AB 4-0 PS2 18 (SUTURE) ×1 IMPLANT
SUT PDS AB 2-0 CT2 27 (SUTURE) IMPLANT
SUT SILK 2 0 SH (SUTURE) IMPLANT
SUT SILK 3 0 SH 30 (SUTURE) IMPLANT
SUT VIC AB 3-0 SH 27 (SUTURE) ×2
SUT VIC AB 3-0 SH 27X BRD (SUTURE) ×1 IMPLANT
SUT VIC AB 4-0 PS2 18 (SUTURE) IMPLANT
SUT VICRYL 0 CT-2 (SUTURE) IMPLANT
SUT VLOC 180 0 24IN GS25 (SUTURE) IMPLANT
SYR 10ML LL (SYRINGE) ×1 IMPLANT
SYR 50ML LL SCALE MARK (SYRINGE) ×1 IMPLANT
SYR BULB EAR ULCER 3OZ GRN STR (SYRINGE) ×1 IMPLANT
SYR BULB IRRIG 60ML STRL (SYRINGE) ×1 IMPLANT
SYR CONTROL 10ML LL (SYRINGE) ×1 IMPLANT
TAPE MEASURE VINYL STERILE (MISCELLANEOUS) IMPLANT
TISSUE EXPNDR FV FOURTE 500 (Prosthesis & Implant Plastic) ×1 IMPLANT
TOWEL GREEN STERILE FF (TOWEL DISPOSABLE) ×1 IMPLANT
TRACER MAGTRACE VIAL (MISCELLANEOUS) IMPLANT
TRAY DSU PREP LF (CUSTOM PROCEDURE TRAY) IMPLANT
TUBE CONNECTING 20X1/4 (TUBING) ×1 IMPLANT
UNDERPAD 30X36 HEAVY ABSORB (UNDERPADS AND DIAPERS) ×2 IMPLANT
YANKAUER SUCT BULB TIP NO VENT (SUCTIONS) ×1 IMPLANT

## 2023-06-08 NOTE — Anesthesia Procedure Notes (Signed)
Procedure Name: LMA Insertion Date/Time: 06/08/2023 7:36 AM  Performed by: Demetrio Lapping, CRNAPre-anesthesia Checklist: Patient identified, Emergency Drugs available, Suction available and Patient being monitored Patient Re-evaluated:Patient Re-evaluated prior to induction Oxygen Delivery Method: Circle System Utilized Preoxygenation: Pre-oxygenation with 100% oxygen Induction Type: IV induction Ventilation: Mask ventilation without difficulty LMA: LMA inserted LMA Size: 4.0 Number of attempts: 1 Airway Equipment and Method: Bite block Placement Confirmation: positive ETCO2 Tube secured with: Tape Dental Injury: Teeth and Oropharynx as per pre-operative assessment

## 2023-06-08 NOTE — Op Note (Signed)
Operative Note   DATE OF OPERATION: 10.7.2024  LOCATION: [facility]-observation  SURGICAL DIVISION: Plastic Surgery  PREOPERATIVE DIAGNOSES:  1.Left breast cancer metastasized to lymph node   POSTOPERATIVE DIAGNOSES:  same  PROCEDURE:  1. Left breast reconstruction with tissue expander 2. Acellular dermis (Alloderm) to left chest 300 cm2  SURGEON: Glenna Fellows MD MBA  ASSISTANT: none  ANESTHESIA:  General.   EBL: 150 ml for entire procedure  COMPLICATIONS: None immediate.   INDICATIONS FOR PROCEDURE:  The patient, Briana [name], is a 47 y.o. female born on 1976-02-05, is here for immediate prepectoral tissue expander acellular dermis reconstruction following skin reduction pattern mastectomy.   FINDINGS: Natrelle 133S FV-13-T 500 ml tissue expander placed initial fill volume 180 ml air SN [id number]   DESCRIPTION OF PROCEDURE:  The patient was marked to mark sternal notch, chest midline, anterior axillary lines and inframammary folds. Patient was marked for skin reduction mastectomy with most superior portion nipple areola marked on breast meridian. Vertical limbs marked by breast displacement and set at 10 cm length. The patient was taken to the operating room. SCDs were placed and IV antibiotics were given. . The patient's operative site was prepped and draped in a sterile fashion. A time out was performed and all information was confirmed to be correct. In supine position, the lateral limbs for resection marked and area over lower pole preserved as inferiorly based dermal pedicle. Skin de epithelialized in this area. I assisted in mastectomy in retraction and exposure.    The cavity was irrigated with solution containing Ancef, gentamicin, and bacitracin. Hemostasis was ensured. A 19 Fr drain was placed in subcutaneous position laterally and a 15 Fr drain placed along inframammary fold. Each secured to skin with 2-0 nylon. Cavity irrigated with saline solution containing  Ancef, gentamicin, and Betadine. The tissue expander was prepared on back table prior in insertion. The expander was filled with air. Perforated acellular dermis was  draped over anterior surface expander. The ADM was then secured to itself over posterior surface of expander with 4-0 chromic. Redundant folds acellular dermis excised so that the ADM lay flat without folds over air filled expander. The expander was secured to medial insertion pectoralis with a 0 vicryl. The superior and lateral tabs also secured to pectoralis muscle with 0-vicryl. The ADM was secured to pectoralis muscle and chest wall along inferior border at inframammary fold with 0 V lock. Laterally the mastectomy flap over posterior axillary line was advanced anteriorly and the subcutaneous tissue and superficial fascia was secured to chest wall with 0-vicryl. The inferiorly based dermal pedicle was redraped superiorly over expander and acellular dermis and secured to pectoralis with interrupted 0-vicryl. Skin closure completed with 3-0 vicryl in fascial layer and 4-0 vicryl in dermis. Skin closure completed with 4-0 monocryl subcuticular. Two areas of thermal injury left axilla following lymph node dissection were directly excised full thickness skin.  Layered closure completed with 3-0 vicryl in dermis and 4 running simple 4-0 monocryl skin closure for each. Tissue adhesive applied. Tegaderms applied, followed by dry dressing and breast binder.   The patient was allowed to wake from anesthesia, extubated and taken to the recovery room in satisfactory condition.   SPECIMENS: additional left breast tissue  DRAINS: 15 Fr and 19 Fr JP in left subcutaneous chest   Glenna Fellows, MD [facility] Plastic & Reconstructive Surgery  Office/ physician access line after hours [phone]

## 2023-06-08 NOTE — Anesthesia Preprocedure Evaluation (Signed)
Anesthesia Evaluation  Patient identified by MRN, date of birth, ID band Patient awake    Reviewed: Allergy & Precautions, H&P , NPO status , Patient's Chart, lab work & pertinent test results  Airway Mallampati: II  TM Distance: >3 FB Neck ROM: Full    Dental no notable dental hx.    Pulmonary neg pulmonary ROS   Pulmonary exam normal breath sounds clear to auscultation       Cardiovascular hypertension, Normal cardiovascular exam Rhythm:Regular Rate:Normal     Neuro/Psych negative neurological ROS  negative psych ROS   GI/Hepatic negative GI ROS, Neg liver ROS,,,  Endo/Other  negative endocrine ROS    Renal/GU Renal InsufficiencyRenal disease  negative genitourinary   Musculoskeletal negative musculoskeletal ROS (+)    Abdominal   Peds negative pediatric ROS (+)  Hematology  (+) Blood dyscrasia, anemia , HIV  Anesthesia Other Findings   Reproductive/Obstetrics negative OB ROS                             Anesthesia Physical Anesthesia Plan  ASA: 3  Anesthesia Plan: General   Post-op Pain Management: Regional block* and Tylenol PO (pre-op)*   Induction: Intravenous  PONV Risk Score and Plan: 3 and Ondansetron, Dexamethasone, Midazolam and Treatment may vary due to age or medical condition  Airway Management Planned: LMA  Additional Equipment:   Intra-op Plan:   Post-operative Plan: Extubation in OR  Informed Consent: I have reviewed the patients History and Physical, chart, labs and discussed the procedure including the risks, benefits and alternatives for the proposed anesthesia with the patient or authorized representative who has indicated his/her understanding and acceptance.     Dental advisory given  Plan Discussed with: CRNA  Anesthesia Plan Comments:        Anesthesia Quick Evaluation

## 2023-06-08 NOTE — Anesthesia Postprocedure Evaluation (Signed)
Anesthesia Post Note  Patient: Briana [name]  Procedure(s) Performed: LEFT MASTECTOMY WITH TARGETED LYMPH NODE DISSECTION (Left: Breast) LEFT BREAST RECONSTRUCTION WITH PLACEMENT OF TISSUE EXPANDER AND ALLODERM (Left: Breast)     Patient location during evaluation: PACU Anesthesia Type: General Level of consciousness: awake and alert Pain management: pain level controlled Vital Signs Assessment: post-procedure vital signs reviewed and stable Respiratory status: spontaneous breathing, nonlabored ventilation and respiratory function stable Cardiovascular status: blood pressure returned to baseline and stable Postop Assessment: no apparent nausea or vomiting Anesthetic complications: no   No notable events documented.  Last Vitals:  Vitals:   06/08/23 1145 06/08/23 1200  BP: (!) 155/100 (!) 144/109  Pulse: (!) 101 (!) 102  Resp: 17 19  Temp:    SpO2: 99% 97%    Last Pain:  Vitals:   06/08/23 1200  TempSrc:   PainSc: 4                  Lowella Curb

## 2023-06-08 NOTE — Transfer of Care (Signed)
Immediate Anesthesia Transfer of Care Note  Patient: Briana [name]  Procedure(s) Performed: LEFT MASTECTOMY WITH TARGETED LYMPH NODE DISSECTION (Left: Breast) LEFT BREAST RECONSTRUCTION WITH PLACEMENT OF TISSUE EXPANDER AND ALLODERM (Left: Breast)  Patient Location: PACU  Anesthesia Type:General  Level of Consciousness: drowsy  Airway & Oxygen Therapy: Patient Spontanous Breathing and Patient connected to face mask oxygen  Post-op Assessment: Report given to RN and Post -op Vital signs reviewed and stable  Post vital signs: Reviewed and stable  Last Vitals:  Vitals Value Taken Time  BP    Temp    Pulse    Resp    SpO2      Last Pain:  Vitals:   06/08/23 0636  TempSrc: Temporal  PainSc: 0-No pain      Patients Stated Pain Goal: 3 (06/08/23 0636)  Complications: No notable events documented.

## 2023-06-08 NOTE — Progress Notes (Signed)
Patient had her L mastectomy with TLND along with placement of tissue expander.   Will follow for final pathology.  Oncology Nurse Navigator Documentation     06/08/2023    1:15 PM  Oncology Nurse Navigator Flowsheets  Phase of Treatment Surgery  Surgery Actual Start Date: 06/08/2023  Navigator Follow Up Date: 06/12/2023  Navigator Follow Up Reason: Pathology  Navigator Location [facility]  Navigator Encounter Type Appt/Treatment Plan Review  Patient Visit Type MedOnc  Treatment Phase Active Tx  Barriers/Navigation Needs No Barriers At This Time  Interventions None Required  Acuity Level 1-No Barriers  Support Groups/Services Friends and Family  Time Spent with Patient 15

## 2023-06-08 NOTE — Interval H&P Note (Signed)
History and Physical Interval Note:no change in H and P  06/08/2023 7:04 AM  Briana [name]  has presented today for surgery, with the diagnosis of LEFT BREAST CANCER.  The various methods of treatment have been discussed with the patient and family. After consideration of risks, benefits and other options for treatment, the patient has consented to  Procedure(s) with comments: LEFT MASTECTOMY WITH TARGETED LYMPH NODE DISSECTION (Left) - LMA PEC BLOCK LEFT BREAST RECONSTRUCTION WITH PLACEMENT OF TISSUE EXPANDER AND ALLODERM (Left) as a surgical intervention.  The patient's history has been reviewed, patient examined, no change in status, stable for surgery.  I have reviewed the patient's chart and labs.  Questions were answered to the patient's satisfaction.     [name]

## 2023-06-08 NOTE — Progress Notes (Signed)
Assisted Dr. Miller with left, pectoralis, ultrasound guided block. Side rails up, monitors on throughout procedure. See vital signs in flow sheet. Tolerated Procedure well. 

## 2023-06-08 NOTE — Op Note (Signed)
LEFT MASTECTOMY WITH TARGETED LYMPH NODE DISSECTION  Procedure Note  Briana [name] 06/08/2023   Pre-op Diagnosis: LEFT BREAST CANCER     Post-op Diagnosis: same  Procedure(s): LEFT MASTECTOMY WITH TARGETED LYMPH NODE DISSECTION  Surgeon: [name], MD   Anesthesia: General  Staff:  Circulator: Lenn Cal, RN Scrub Person: Raliegh Scarlet, RN Circulator Assistant: McDonough-Hughes, Maceo Pro, RN; Konrad Felix, RN  Estimated Blood Loss: Minimal               Specimens: sent to path  Indications: This is a 47 year old female was undergone neurologic chemotherapy for node positive left breast cancer.  She now presents for a total mastectomy with targeted lymph node dissection and immediate reconstruction by plastic surgery  Procedure: The patient was brought to the operating identifies correct patient.  She was placed upon the operating room table and general anesthesia was induced.  Under sterile technique I injected mag trace underneath the left nipple areolar complex and massaged the breast.  Her bilateral breast and axilla were then prepped and draped in usual sterile fashion.  I made a triangular incision around the left nipple areolar complex with a scalpel as marked by the plastic surgeons.  I then dissected down to the breast tissue circumferentially with electrocautery.  I then created skin flaps in a triangular pattern trying to stay just underneath the skin and dermis and going down to the chest wall and then up to the upper chest near the clavicle and the inframammary ridge working my way toward the left axilla.  Once I reached the axilla I then dissected the breast off the chest wall from lateral to medial with the electrocautery at the pectoralis fascia.  Once the mastectomy was completed I marked the lateral margin with a silk suture. With the aid of the neoprobe I then dissected down into the left axilla and was finally able to identify the targeted  lymph node which I excised with the cautery.  I could see mag trace going into lymphatic vessels but had minimal uptake of mag trace in any further lymph nodes.  There were palpable lymph nodes which I excised in this area with the cautery and sent to pathology for evaluation.  No other enlarged lymph nodes were identified in the left axilla.  I achieved hemostasis with surgical clips and a 3-0 silk suture in the axilla.  We then irrigated the chest wall and axilla with saline and hemostasis appeared to be achieved.  At this point Dr. Leta Baptist continued with her portion of the immediate reconstruction.  She remained stable under general anesthesia.          [name]   Date: 06/08/2023  Time: 8:51 AM

## 2023-06-08 NOTE — Interval H&P Note (Signed)
History and Physical Interval Note:  06/08/2023 7:04 AM  Briana [name]  has presented today for surgery, with the diagnosis of LEFT BREAST CANCER.  The various methods of treatment have been discussed with the patient and family. After consideration of risks, benefits and other options for treatment, the patient has consented to  Procedure(s) with comments: LEFT MASTECTOMY WITH TARGETED LYMPH NODE DISSECTION (Left) - LMA PEC BLOCK LEFT BREAST RECONSTRUCTION WITH PLACEMENT OF TISSUE EXPANDER AND ALLODERM (Left) as a surgical intervention.  The patient's history has been reviewed, patient examined, no change in status, stable for surgery.  I have reviewed the patient's chart and labs.  Questions were answered to the patient's satisfaction.     Irean Hong Maximus Hoffert

## 2023-06-08 NOTE — Anesthesia Procedure Notes (Signed)
Anesthesia Regional Block: Pectoralis block   Pre-Anesthetic Checklist: , timeout performed,  Correct Patient, Correct Site, Correct Laterality,  Correct Procedure, Correct Position, site marked,  Risks and benefits discussed,  Surgical consent,  Pre-op evaluation,  At surgeon's request and post-op pain management  Laterality: Left  Prep: chloraprep       Needles:  Injection technique: Single-shot  Needle Type: Stimiplex     Needle Length: 9cm  Needle Gauge: 21     Additional Needles:   Procedures:,,,, ultrasound used (permanent image in chart),,    Narrative:  Start time: 06/08/2023 7:07 AM End time: 06/08/2023 7:12 AM Injection made incrementally with aspirations every 5 mL.  Performed by: Personally  Anesthesiologist: Lowella Curb, MD

## 2023-06-09 ENCOUNTER — Encounter (HOSPITAL_BASED_OUTPATIENT_CLINIC_OR_DEPARTMENT_OTHER): Payer: Self-pay | Admitting: Surgery

## 2023-06-09 DIAGNOSIS — C50412 Malignant neoplasm of upper-outer quadrant of left female breast: Secondary | ICD-10-CM | POA: Diagnosis not present

## 2023-06-09 MED ORDER — SODIUM CHLORIDE FLUSH 0.9 % IV SOLN
INTRAVENOUS | Status: AC
  Filled 2023-06-09: qty 10

## 2023-06-09 NOTE — Discharge Instructions (Signed)

## 2023-06-09 NOTE — Discharge Summary (Signed)
Physician Discharge Summary  Patient ID: Briana [name] MRN: [id number] DOB/AGE: [birth year] 47 y.o.  Admit date: 06/08/2023 Discharge date: 06/09/2023  Admission Diagnoses: Left breast cancer  Discharge Diagnoses:  Principal Problem:   Breast cancer, left breast [facility])   Discharged Condition: stable  Hospital Course: Post operatively patient did well tolerating diet ambulatory with minimal assist and pain controlled. Instructed on bathing and drain care.  Treatments: surgery: left mastectomy sentinel node breast reconstruction with tissue expander acellular dermis 10.7.2024  Discharge Exam: Blood pressure (!) 136/92, pulse 79, temperature 97.8 F (36.6 C), resp. rate 18, height 5\' 5"  (1.651 m), weight 79.2 kg, SpO2 100%. Incision/Wound: chest soft incisions dry intact Tegaderms in place drains serosanguinous  Disposition:    Allergies as of 06/09/2023       Reactions   Pumpkin Flavor Swelling   ONLY Lip swelling after eating pumpkin pie        Medication List     TAKE these medications    amLODipine 10 MG tablet Commonly known as: NORVASC Take 1 tablet (10 mg total) by mouth daily.   Biktarvy 50-200-25 MG Tabs tablet Generic drug: bictegravir-emtricitabine-tenofovir AF TAKE 1 TABLET DAILY   ferrous sulfate 325 (65 FE) MG EC tablet Take 325 mg by mouth daily with breakfast.   hydrochlorothiazide 12.5 MG tablet Commonly known as: HYDRODIURIL Take 1 tablet (12.5 mg total) by mouth daily.   loperamide 2 MG tablet Commonly known as: IMODIUM A-D Take 2 mg by mouth 4 (four) times daily as needed for diarrhea or loose stools.   ondansetron 8 MG tablet Commonly known as: Zofran Take 1 tablet (8 mg total) by mouth every 8 (eight) hours as needed for nausea or vomiting. Start on the third day after chemotherapy.   potassium chloride SA 20 MEQ tablet Commonly known as: KLOR-CON M Take 2 tablets (40 mEq total) by mouth 2 (two) times daily.   prochlorperazine 10  MG tablet Commonly known as: COMPAZINE Take 1 tablet (10 mg total) by mouth every 6 (six) hours as needed for nausea or vomiting.   traMADol 50 MG tablet Commonly known as: ULTRAM Take 1 tablet (50 mg total) by mouth every 6 (six) hours as needed.   triamcinolone 0.025 % ointment Commonly known as: KENALOG APPLY TOPICALLY TWICE DAILY AS NEEDED        Follow-up Information     Glenna Fellows, MD Follow up on 06/12/2023.   Specialty: Plastic Surgery Why: as scheduled Contact information: [address] [place] [address] [phone]         [name], MD. Schedule an appointment as soon as possible for a visit.   Specialty: General Surgery Why: 2-3 weeks Contact information: [address] [place] [address] [phone]                 Signed: Glenna Fellows 06/09/2023, 7:20 AM

## 2023-06-12 ENCOUNTER — Encounter: Payer: Self-pay | Admitting: *Deleted

## 2023-06-12 NOTE — Progress Notes (Signed)
Reviewed path with Dr Myna Hidalgo. No additional path needs at this time. Message sent to scheduling for follow up appointment.   Oncology Nurse Navigator Documentation     06/12/2023   11:30 AM  Oncology Nurse Navigator Flowsheets  Navigator Follow Up Date: 07/20/2023  Navigator Follow Up Reason: Follow-up Appointment  Navigator Location [facility]  Navigator Encounter Type Pathology Review  Patient Visit Type MedOnc  Treatment Phase Active Tx  Barriers/Navigation Needs No Barriers At This Time  Interventions Coordination of Care  Acuity Level 1-No Barriers  Coordination of Care Appts  Support Groups/Services Friends and Family  Time Spent with Patient 15

## 2023-06-15 ENCOUNTER — Telehealth: Payer: Self-pay | Admitting: Hematology & Oncology

## 2023-06-15 LAB — SURGICAL PATHOLOGY

## 2023-06-15 NOTE — Telephone Encounter (Signed)
Left vm for pt regarding change of appt per staff message from [place]. Provided name and call back number if new date and time does not work.

## 2023-06-16 ENCOUNTER — Other Ambulatory Visit: Payer: Self-pay

## 2023-06-16 ENCOUNTER — Encounter: Payer: Self-pay | Admitting: Hematology & Oncology

## 2023-06-16 ENCOUNTER — Encounter: Payer: Self-pay | Admitting: Family

## 2023-06-17 ENCOUNTER — Other Ambulatory Visit: Payer: No Typology Code available for payment source

## 2023-06-17 ENCOUNTER — Telehealth: Payer: Self-pay | Admitting: *Deleted

## 2023-06-17 ENCOUNTER — Other Ambulatory Visit: Payer: Self-pay

## 2023-06-17 ENCOUNTER — Ambulatory Visit: Payer: No Typology Code available for payment source | Admitting: Hematology & Oncology

## 2023-06-17 NOTE — Telephone Encounter (Signed)
Patient called inquiring whether she is going to receive radiation therapy.  Discussed with dr Myna Hidalgo.  He pulled patients surgical pathology and will consult Dr Roselind Messier in [place] to discuss plan. Patient aware and appreciative of call

## 2023-06-23 ENCOUNTER — Other Ambulatory Visit: Payer: Self-pay

## 2023-06-23 ENCOUNTER — Other Ambulatory Visit: Payer: No Typology Code available for payment source

## 2023-06-23 DIAGNOSIS — B20 Human immunodeficiency virus [HIV] disease: Secondary | ICD-10-CM

## 2023-06-24 LAB — T-HELPER CELL (CD4) - (RCID CLINIC ONLY)
CD4 % Helper T Cell: 25 % — ABNORMAL LOW (ref 33–65)
CD4 T Cell Abs: 512 /uL (ref 400–1790)

## 2023-06-25 LAB — HIV-1 RNA QUANT-NO REFLEX-BLD
HIV 1 RNA Quant: <20 {copies}/mL — ABNORMAL HIGH
HIV-1 RNA Quant, Log: <1.3 {Log_copies}/mL — ABNORMAL HIGH

## 2023-07-01 ENCOUNTER — Telehealth: Payer: Self-pay | Admitting: Radiation Oncology

## 2023-07-01 NOTE — Telephone Encounter (Signed)
Left message for patient to call back to schedule consult per 10/30 referral.

## 2023-07-06 ENCOUNTER — Other Ambulatory Visit: Payer: Self-pay

## 2023-07-06 ENCOUNTER — Ambulatory Visit: Payer: No Typology Code available for payment source | Attending: Plastic Surgery | Admitting: Physical Therapy

## 2023-07-06 DIAGNOSIS — C50912 Malignant neoplasm of unspecified site of left female breast: Secondary | ICD-10-CM | POA: Diagnosis present

## 2023-07-06 DIAGNOSIS — Z9012 Acquired absence of left breast and nipple: Secondary | ICD-10-CM | POA: Insufficient documentation

## 2023-07-06 DIAGNOSIS — C773 Secondary and unspecified malignant neoplasm of axilla and upper limb lymph nodes: Secondary | ICD-10-CM | POA: Insufficient documentation

## 2023-07-06 DIAGNOSIS — Z483 Aftercare following surgery for neoplasm: Secondary | ICD-10-CM | POA: Insufficient documentation

## 2023-07-06 NOTE — Patient Instructions (Signed)
Flexion (Eccentric) - Active-Assist (Cane)          Cancer Rehab [phone]    Use unaffected arm to push affected arm forward. Avoid hiking shoulder (shoulder should NOT touch cheek). Keep palm relaxed. Slowly lower affected arm. Hold stretch for _5_ seconds repeating _5-10_ times, _1-2_ times a day.  Abduction (Eccentric) - Active-Assist (Cane)    Use unaffected arm to push affected arm out to side. Avoid hiking shoulder (shoulder should NOT touch cheek). Keep palm relaxed. Slowly lower affected arm. Hold stretch _5_ seconds repeating _5-10_ times, _1-2_ times a day.  Cane Exercise: Extension   Stand holding cane behind back with both hands palm-up. Lift the cane away from body until gentle stretch felt. Do NOT lean forward.  Hold __5__ seconds. Repeat _5-10___ times. Do _1-2___ sessions per day.  CHEST: Doorway, Bilateral - Standing    Standing in doorway, place hands on wall with elbows bent at shoulder height and place one foot in front of other. Shift weight onto front foot. Hold _10-20__ seconds. Do _3-5_ times, _1-2_ times a day.     

## 2023-07-06 NOTE — Therapy (Signed)
[facility] Specialty Rehab [address] [place], [place], [address] Phone: [phone]   Fax:  [phone]  Patient Details  Name: Briana [name] MRN: [id number] Date of Birth: 1976/05/30 Referring Provider:  Glenna Fellows, MD  Encounter Date: 07/06/2023   OUTPATIENT PHYSICAL THERAPY ONCOLOGY EVALUATION  Patient Name: Briana [name] MRN: [id number] DOB:November 18, 1975, 47 y.o., female Today's Date: 07/06/2023  END OF SESSION:  PT End of Session - 07/06/23 1223     Visit Number 1    Number of Visits 1    PT Start Time 1007    PT Stop Time 1055    PT Time Calculation (min) 48 min    Activity Tolerance Patient tolerated treatment well    Behavior During Therapy [facility] for tasks assessed/performed             Past Medical History:  Diagnosis Date   Abnormal Pap smear    s/p colposcopy    Anemia    Breast cancer (HCC) 12/2022   Breast cancer metastasized to axillary lymph node, left (HCC) 02/12/2023   Chronic kidney disease 03/01/2013   kidney infection   HIV infection (HCC)    Screening for malignant neoplasm of the cervix    Past Surgical History:  Procedure Laterality Date   BREAST BIOPSY Left 01/20/2023   [place] LT BREAST BX W LOC DEV 1ST LESION IMG BX SPEC US GUIDE 01/20/2023 [facility]   BREAST BIOPSY Left 06/04/2023   [place] LT RADIOACTIVE SEED LOC 06/04/2023 [facility] MAMMOGRAPHY   BREAST RECONSTRUCTION WITH PLACEMENT OF TISSUE EXPANDER AND ALLODERM Left 06/08/2023   Procedure: LEFT BREAST RECONSTRUCTION WITH PLACEMENT OF TISSUE EXPANDER AND ALLODERM;  Surgeon: Glenna Fellows, MD;  Location: [facility];  Service: Plastics;  Laterality: Left;   CESAREAN SECTION     2000/2008   IR IMAGING GUIDED PORT INSERTION  02/06/2023   MASTECTOMY WITH AXILLARY LYMPH NODE DISSECTION Left 06/08/2023   Procedure: LEFT MASTECTOMY WITH TARGETED LYMPH NODE DISSECTION;  Surgeon: [name], MD;  Location: [facility];   Service: General;  Laterality: Left;  LMA PEC BLOCK   TUBAL LIGATION     Patient Active Problem List   Diagnosis Date Noted   Breast cancer, left breast (HCC) 06/08/2023   Breast cancer metastasized to axillary lymph node, left (HCC) 02/12/2023   Breast lump in female 01/08/2023   HTN (hypertension) 04/29/2022   Routine screening for STI (sexually transmitted infection) 10/03/2020   History of long-term treatment with high-risk medication 10/03/2020   Impetigo 10/03/2020   Screening for cervical cancer 05/23/2019   Heavy menstrual bleeding 05/23/2019   Thrombocytosis 11/26/2016   Eczema 09/11/2011   History of abnormal cervical Pap smear 01/24/2011   Iron deficiency anemia 01/23/2011   Human immunodeficiency virus (HIV) disease (HCC) 10/19/2006    PCP: Hyman Hopes   REFERRING PROVIDER: Glenna Fellows   REFERRING DIAG: [phone] (ICD-10-CM) - Malignant neoplasm of unspecified site of left female breast C77.3 (ICD-10-CM) - Secondary and unspecified malignant neoplasm of axilla and upper limb lymph nodes Z90.12 (ICD-10-CM) - Acquired absence of left breast and nipple  THERAPY DIAG:  Aftercare following surgery for neoplasm  ONSET DATE: May 2024  Rationale for Evaluation and Treatment: Habilitation  SUBJECTIVE:  SUBJECTIVE STATEMENT: Pt says she is doing well.  She hasn't really been having any trouble with range and movement   PERTINENT HISTORY: Pt had chemo, will have radiation that will start soon, possibly more surgery. 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 No PRECAUTIONS: no precautions , at risk for lymphedemda   WEIGHT BEARING RESTRICTIONS: No  FALLS:  Has patient fallen in last 6 months? 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 Will consider weight lifting program in the future   HAND DOMINANCE: right   PRIOR LEVEL OF FUNCTION: Independent  PATIENT GOALS: to get information and find out what she needs to return to normal function    OBJECTIVE: Note: Objective measures were completed at Evaluation unless otherwise noted.  COGNITION: Overall cognitive status: Within functional limits for tasks assessed   PALPATION: Mild fullness in bilateral axilla   OBSERVATIONS / OTHER ASSESSMENTS: pt has well healed incisions on left chest with expander in place.  Healing incisions in left axilla with minimal glue still intact.  She has fullness in left axilla, but has similar body contour on right side   SENSATION: WFL  POSTURE: mild forward head and increased kyphosis   UPPER EXTREMITY AROM/PROM:  A/PROM RIGHT   eval   Shoulder extension   Shoulder flexion 170  Shoulder abduction 175  Shoulder internal rotation   Shoulder external rotation 95    (Blank rows = not tested)  A/PROM LEFT   eval  Shoulder extension   Shoulder flexion 170  Shoulder abduction 175  Shoulder internal rotation   Shoulder external rotation 93    (Blank rows = not tested)  CERVICAL AROM: All within normal limits:    UPPER EXTREMITY STRENGTH: WFL with pt reporting no deficits    LYMPHEDEMA ASSESSMENTS:   SURGERY TYPE/DATE: left mastectomy and sentinel node biopsy with immediate expander placement   NUMBER OF LYMPH NODES REMOVED: sentinel nodes  CHEMOTHERAPY: yes  RADIATION: planned to start soon   HORMONE TREATMENT: no  INFECTIONS: no   LYMPHEDEMA ASSESSMENTS:   LANDMARK RIGHT  eval  10 cm proximal to olecranon process 28.5  Olecranon process 24.5  10 cm proximal to ulnar styloid process 20.5  Just  proximal to ulnar styloid process 15.5  Across hand at thumb web space 19  At base of 2nd digit 5.5  (Blank rows = not tested)  LANDMARK LEFT  eval  10 cm proximal to olecranon process 29  Olecranon process 24.7  10 cm proximal to ulnar styloid process 20  Just proximal to ulnar styloid process 15.3  Across hand at thumb web space 19  At base of 2nd digit 5.5  (Blank rows = not tested)    QUICK DASH SURVEY: 2.27    TODAY'S TREATMENT:  DATE: 07/06/2023 Pt instructed in and performed dowel exercise in supine and standing to maintain shoulder ROM, scheduled for ABC Class and suggested she pursue working out at [employer] after treatment is all completed with a trainer there to start low and progress slowly. Pt was also given information about [facility] [employer] and asked to call Dr. Leta Baptist for advice about what kind of bra she should get and a presciption   PATIENT EDUCATION:  Education details: as above  Person educated: Patient and Parent Education method: Explanation, Demonstration, and Handouts Education comprehension: verbalized understanding and returned demonstration  HOME EXERCISE PROGRAM: Standing dowel program   ASSESSMENT:  CLINICAL IMPRESSION: Patient is a 47 y.o. female  who was seen today for physical therapy evaluation and treatment for aftercare after surgery for left breast cancer and immedicate expander placement .   OBJECTIVE IMPAIRMENTS: none, pt in process of healing without difficulty   ACTIVITY LIMITATIONS: none  PARTICIPATION LIMITATIONS: none   CLINICAL DECISION MAKING: Stable/uncomplicated  EVALUATION COMPLEXITY: Low  GOALS: Goals reviewed with patient? Yes  SHORT TERM GOALS: Target date: 11/4 completed on eval   Pt will be able to perfom simple stretching home exercise for shoulders to maintain shoulder ROM  during and after radiation  Goal status: MET  2.  Pt will be scheduled for ABC class to be educated in lymphedema risk reduction practices   Goal status: MET  pt scheduled for Nov. 18   3.  Pt will be knowledgable about where to get a compression bra   Goal status: MET   PLAN:    PLAN FOR NEXT SESSION: Pt does not need PT follow up at this time.  She was informed to call us should she develop any issues that we could help her with in the future    Donnetta Hail, PT 07/06/2023, 12:24 PM  Donnetta Hail, PT 07/06/2023, 12:24 PM  [facility] Specialty Rehab [address] [place], [place], [address] Phone: [phone]   Fax:  [phone]

## 2023-07-07 ENCOUNTER — Encounter: Payer: Self-pay | Admitting: Internal Medicine

## 2023-07-07 ENCOUNTER — Ambulatory Visit: Payer: No Typology Code available for payment source | Admitting: Internal Medicine

## 2023-07-07 ENCOUNTER — Other Ambulatory Visit: Payer: Self-pay

## 2023-07-07 VITALS — BP 134/91 | HR 101 | Temp 98.1°F | Ht 65.0 in | Wt 166.0 lb

## 2023-07-07 DIAGNOSIS — B20 Human immunodeficiency virus [HIV] disease: Secondary | ICD-10-CM

## 2023-07-07 DIAGNOSIS — Z23 Encounter for immunization: Secondary | ICD-10-CM

## 2023-07-07 DIAGNOSIS — C50912 Malignant neoplasm of unspecified site of left female breast: Secondary | ICD-10-CM

## 2023-07-07 NOTE — Progress Notes (Signed)
   Subjective:    Patient ID: Briana [name], female    DOB: Oct 19, 1975, 47 y.o.   MRN: [id number]  HPI Briana [name] is here for follow up of HIV She is undergoing treatment for metastatic breast cancer and is s/p chemotherapy and mastectomy and starting radiation next week.  She has had no issues with Biktarvy and no issues getting or taking it.  No complaints today.    Review of Systems  Constitutional:  Negative for fatigue.  Gastrointestinal:  Negative for diarrhea and nausea.       Objective:   Physical Exam Eyes:     General: No scleral icterus. Pulmonary:     Effort: Pulmonary effort is normal.  Neurological:     Mental Status: She is alert.   SH: no tobacco        Assessment & Plan:

## 2023-07-07 NOTE — Assessment & Plan Note (Signed)
Discussed the flu shot and given today 

## 2023-07-07 NOTE — Assessment & Plan Note (Signed)
She is doing well on her regimen with no concerns.  Labs reviewed with her and no changes indicated.   Rtc in 6 months.

## 2023-07-07 NOTE — Assessment & Plan Note (Addendum)
She continues in care and going to radiation next week.   Support provided

## 2023-07-08 NOTE — Progress Notes (Signed)
Location of Breast Cancer:Left mastectomy   Histology per Pathology Report:   SURGICAL PATHOLOGY **** THIS IS AN ADDENDUM REPORT **** CASE: [id number] PATIENT: Briana [name] Surgical Pathology Report **********Addendum **********  Reason for Addendum #1:  Breast Biomarker Results  Clinical History: left breast cancer (cm)     FINAL MICROSCOPIC DIAGNOSIS:  A. BREAST, LEFT, MASTECTOMY:      Invasive carcinoma with mixed ductal and lobular features, 3.1 cm           Invasive ductal carcinoma component: Grade 3 (3+3+2)           Invasive lobular carcinoma component: Grade 2 (3+2+1) Ductal carcinoma in situ:  Solid and cribriform types with necrosis, high nuclear grade Lobular carcinoma in situ: Intermediate nuclear grade Margins, invasive:  Negative for invasive ductal and lobular carcinoma      Closest, invasive:  IDC: > 10 mm from all margins        ILC:  1.8 mm from anterior-inferior margin Margins, DCIS and LCIS:  Negative for ductal and lobular carcinoma in situ           Closest, DCIS and LCIS:  DCIS: 5 mm from anterior-inferior margin                               LCIS: 9 mm from anterior-inferior margin Lymphovascular invasion:  Not identified Biopsy site and clip (Venus shaped clip): Identified Prognostic markers: ER: 0%, negative PR: 0%, negative Her2: Negative by FISH Ki-67: 90% Other:  Fibroadenomatoid changes with adenosis See oncology table and comment  B. LYMPH NODE, LEFT AXILLARY TARGETED, EXCISION:      Benign fibroadipose tissue with prior biopsy site changes.      No lymphoid tissue identified.      Biopsy site change and spiral shaped clip identified.  C. LYMPH NODE, LEFT AXILLARY, EXCISION:      Two lymph nodes, negative for residual metastatic carcinoma (0/2).              (One lymph node was positive for metastatic carcinoma on prior biopsy [id number])      Marked granulomatous inflammation consistent with prior biopsy changes and post  treatment effect.  D. BREAST, LEFT ADDITIONAL TISSUE, EXCISION:      Benign breast tissue, negative for atypia or malignancy.    Receptor Status: ER(0%, negative), PR (0%, negative), Her2-neu (Negative by FISH), Ki-67(90%)  Did patient present with symptoms (if so, please note symptoms) or was this found on screening mammography?:   Past/Anticipated interventions by surgeon, if any:   Past/Anticipated interventions by medical oncology, if any: Chemotherapy  Impression and Plan:  Briana [name] is a very nice 47 year old premenopausal Afro-American female with TRIPLE NEGATIVE breast cancer.  She has aggressive disease in my opinion.   This will be her fourth and final cycle of neoadjuvant chemoimmunotherapy.  I think she has responded well.  I cannot palpate anything in the left breast now.  I am sure there is probably still some tumor there.  However, I would like to hope that this will be able to be resected out much more easily.   We will see about a MRI to be done in a couple weeks.   I think she sees the surgeon tomorrow.  Hopefully, she will be able to have surgery sometime in September.   Again, I think she really has responded nicely to treatment.  I am just very impressed as to how  well she is tolerated therapy.   I will plan to see her back myself in about a month.  I suppose we might be able to see her back after she has her surgery.  Again I am not sure when surgery will be scheduled.     Josph Macho, MD 8/29/20249:09 AM  Lymphedema issues, if any:  {:18581} {t:21944}   Pain issues, if any:  {:18581} {PAIN DESCRIPTION:21022940}  SAFETY ISSUES: Prior radiation? {:18581} Pacemaker/ICD? {:18581} Possible current pregnancy?{:18581} Is the patient on methotrexate? {:18581}  Current Complaints / other details:  ***

## 2023-07-08 NOTE — Progress Notes (Signed)
Radiation Oncology         [phone] ________________________________  Initial Outpatient Consultation  Name: Briana [name] MRN: [id number]  Date: 07/09/2023  DOB: 1975-09-11  WU:JWJX, Lollie Marrow, NP  Josph Macho, MD   REFERRING PHYSICIAN: Josph Macho, MD  DIAGNOSIS: {There were no encounter diagnoses. (Refresh or delete this SmartLink)}  S/p neoadjuvant chemoimmunotherapy and left breast mastectomy with left axillary TAD : Mixed invasive ductal and invasive lobular carcinoma of the left breast, along with DCIS and LCIS - clear margins and negative nodes -- Invasive lobular carcinoma : ER +/ PR+ /  Her2- / Grade 2.  -- Invasive ductal carcinoma : ER+ / PR+ / Her2 - / Grade 3.   Pre-treatment: Left Breast, Invasive ductal carcinoma with left axillary LN involvement, ER- / PR- / Her2- / Grade 3  HISTORY OF PRESENT ILLNESS::Briana [name] is a 47 y.o. female who is accompanied by ***. she is seen as a courtesy of Dr. Myna Hidalgo for an opinion concerning radiation therapy as part of management for her recently diagnosed left breast cancer.   This past May, the patient noticed a palpable lump in her left breast. She subsequently presented for a bilateral diagnostic mammogram and left breast ultrasound at [facility] on 01/19/23 which showed: a highly suspicious mass in the upper outer left breast, with associated distortions, calcifications, and an area of asymmetry, collectively spanning an area measuring approximately 12 cm. Imaging also revealed a 1.6 cm group of calcifications in the central left breast, and at least 4 abnormal left axillary lymph nodes.   Biopsy of the 1 o'clock (upper outer) left breast on 01/20/23 showed grade 3 invasive ductal carcinoma measuring 1.5 cm in the greatest linear extent of the sample; positive for LVI. Prognostic indicators significant for: estrogen receptor 0% negative; progesterone receptor 0% negative; Proliferation marker Ki67 at 90%;  Her2 status negative; Grade 3.  A left axillary lymph node biopsy was also performed which came back positive for metastatic carcinoma.   Bilateral breast MRI on 01/30/23 showed: the large lobular mass within the lateral upper outer left breast consistent with the biopsy proven malignancy, extensive non mass enhancement surrounding this mass as well as extending anterior to the level of the nipple-areolar complex as well as medially to involve the anterior upper inner quadrant of the left breast, a 7 mm oval mass in the anterior lower inner left breast, and extensive metastatic left axillary adenopathy.   PET scan on 02/03/23 showed: the hypermetabolic left breast mass, consistent with the primary breast carcinoma; hypermetabolic left axillary, left subpectoral and left supraclavicular lymph nodes, consistent with nodal metastatic disease; and a tiny hypermetabolic left intramammary lymph node, consistent with an additional site of nodal metastatic disease. PET otherwise showed no evidence of metastatic disease in the abdomen or pelvis.   Based on her triple negative disease (and high proliferation index and high grade tumor), she was referred to Dr. Myna Hidalgo and began neoadjuvant chemoimmunotherapy consisting of Taxol, Carbo, and Keytruda on 02/20/23.   She really struggled with her first cycle of chemotherapy. She developed diarrhea, fevers, hypokalemia, dehydration, and tachycardia. Her second cycle was subsequently delayed until 03/19/23. Her symptoms gradually improved and she completed her 4th and final cycle of chemoimmunotherapy on 04/23/23.   Bilateral breast MRI on 05/06/23 (s/p neoadjuvant chemoimmunotherapy) showed evidence of a positive response to neoadjuvant treatment, and a persistent but minimal non mass enhancement in the upper outer left breast spanning 8.1 cm in  the greatest extent.  Accordingly, she was referred to Dr. Magnus Ivan and opted to proceed with a left breast mastectomy and  left axillary TAD with left breast reconstructive surgery on 06/08/23. Pathology from the procedure revealed: tumor the size of 3.1 cm; histology of invasive carcinoma with mixed ductal and lobular features (grade 3 invasive ductal carcinoma and grade 2 invasive lobular carcinoma), high grade DCIS, along with intermediate grade LCIS; all margins negative for invasive and in situ carcinoma; nodal status of 2/2 left axillary SLN excisions negative for carcinoma,  -- Invasive lobular carcinoma : Prognostic indicators significant for: estrogen receptor 95% positive with moderate to strong staining intensity; progesterone receptor 100% positive with strong staining intensity; Proliferation marker Ki67 at 10%; Her2 status negative; Grade 2.  -- Invasive ductal carcinoma : Prognostic indicators significant for: estrogen receptor 90% positive with moderate to strong staining intensity; progesterone receptor 100% positive with strong staining intensity; Proliferation marker Ki67 at 10%; Her2 status negative; Grade 3.   Post-operatively, she developed a left axillary seroma at the site of the tissue expander. This was aspirated by Dr. Leta Baptist on 06/29/23 and yielded 100 cc's of fluid.    PREVIOUS RADIATION THERAPY: No  PAST MEDICAL HISTORY:  Past Medical History:  Diagnosis Date   Abnormal Pap smear    s/p colposcopy    Anemia    Breast cancer (HCC) 12/2022   Breast cancer metastasized to axillary lymph node, left (HCC) 02/12/2023   Chronic kidney disease 03/01/2013   kidney infection   HIV infection (HCC)    Screening for malignant neoplasm of the cervix     PAST SURGICAL HISTORY: Past Surgical History:  Procedure Laterality Date   BREAST BIOPSY Left 01/20/2023   [place] LT BREAST BX W LOC DEV 1ST LESION IMG BX SPEC US GUIDE 01/20/2023 [facility]   BREAST BIOPSY Left 06/04/2023   [place] LT RADIOACTIVE SEED LOC 06/04/2023 [facility]   BREAST RECONSTRUCTION WITH PLACEMENT OF TISSUE EXPANDER  AND ALLODERM Left 06/08/2023   Procedure: LEFT BREAST RECONSTRUCTION WITH PLACEMENT OF TISSUE EXPANDER AND ALLODERM;  Surgeon: Glenna Fellows, MD;  Location: [facility];  Service: Plastics;  Laterality: Left;   CESAREAN SECTION     2000/2008   IR IMAGING GUIDED PORT INSERTION  02/06/2023   MASTECTOMY WITH AXILLARY LYMPH NODE DISSECTION Left 06/08/2023   Procedure: LEFT MASTECTOMY WITH TARGETED LYMPH NODE DISSECTION;  Surgeon: [name], MD;  Location: [facility];  Service: General;  Laterality: Left;  LMA PEC BLOCK   TUBAL LIGATION      FAMILY HISTORY:  Family History  Problem Relation Age of Onset   Eczema Mother    Diabetes Father    Hypertension Father    Diabetes Maternal Grandmother    Hypertension Maternal Grandmother    Diabetes Maternal Grandfather    Hypertension Maternal Grandfather    Diabetes Paternal Grandmother    Hypertension Paternal Grandmother    Diabetes Paternal Grandfather    Hypertension Paternal Grandfather     SOCIAL HISTORY:  Social History   Tobacco Use   Smoking status: Never   Smokeless tobacco: Never  Vaping Use   Vaping status: Never Used  Substance Use Topics   Alcohol use: No   Drug use: No    ALLERGIES:  Allergies  Allergen Reactions   Pumpkin Flavor Swelling    ONLY Lip swelling after eating pumpkin pie    MEDICATIONS:  Current Outpatient Medications  Medication Sig Dispense Refill   amLODipine (  NORVASC) 10 MG tablet Take 1 tablet (10 mg total) by mouth daily. 90 tablet 1   bictegravir-emtricitabine-tenofovir AF (BIKTARVY) 50-200-25 MG TABS tablet TAKE 1 TABLET DAILY 30 tablet 11   ferrous sulfate 325 (65 FE) MG EC tablet Take 325 mg by mouth daily with breakfast.     hydrochlorothiazide (HYDRODIURIL) 12.5 MG tablet Take 1 tablet (12.5 mg total) by mouth daily. 90 tablet 3   loperamide (IMODIUM A-D) 2 MG tablet Take 2 mg by mouth 4 (four) times daily as needed for diarrhea or loose stools.  (Patient not taking: Reported on 04/30/2023)     ondansetron (ZOFRAN) 8 MG tablet Take 1 tablet (8 mg total) by mouth every 8 (eight) hours as needed for nausea or vomiting. Start on the third day after chemotherapy. (Patient not taking: Reported on 04/09/2023) 30 tablet 1   potassium chloride SA (KLOR-CON M) 20 MEQ tablet Take 2 tablets (40 mEq total) by mouth 2 (two) times daily. (Patient not taking: Reported on 07/07/2023) 60 tablet 5   prochlorperazine (COMPAZINE) 10 MG tablet Take 1 tablet (10 mg total) by mouth every 6 (six) hours as needed for nausea or vomiting. (Patient not taking: Reported on 03/19/2023) 30 tablet 1   traMADol (ULTRAM) 50 MG tablet Take 1 tablet (50 mg total) by mouth every 6 (six) hours as needed. 90 tablet 0   triamcinolone (KENALOG) 0.025 % ointment APPLY TOPICALLY TWICE DAILY AS NEEDED 454 g 1   No current facility-administered medications for this encounter.    REVIEW OF SYSTEMS:  A 10+ POINT REVIEW OF SYSTEMS WAS OBTAINED including neurology, dermatology, psychiatry, cardiac, respiratory, lymph, extremities, GI, GU, musculoskeletal, constitutional, reproductive, HEENT. ***   PHYSICAL EXAM:  vitals were not taken for this visit.   General: Alert and oriented, in no acute distress HEENT: Head is normocephalic. Extraocular movements are intact. Oropharynx is clear. Neck: Neck is supple, no palpable cervical or supraclavicular lymphadenopathy. Heart: Regular in rate and rhythm with no murmurs, rubs, or gallops. Chest: Clear to auscultation bilaterally, with no rhonchi, wheezes, or rales. Abdomen: Soft, nontender, nondistended, with no rigidity or guarding. Extremities: No cyanosis or edema. Lymphatics: see Neck Exam Skin: No concerning lesions. Musculoskeletal: symmetric strength and muscle tone throughout. Neurologic: Cranial nerves II through XII are grossly intact. No obvious focalities. Speech is fluent. Coordination is intact. Psychiatric: Judgment and insight are  intact. Affect is appropriate.  Right Breast: no palpable mass, nipple discharge or bleeding. Left Breast: ***  ECOG = ***  0 - Asymptomatic (Fully active, able to carry on all predisease activities without restriction)  1 - Symptomatic but completely ambulatory (Restricted in physically strenuous activity but ambulatory and able to carry out work of a light or sedentary nature. For example, light housework, office work)  2 - Symptomatic, <50% in bed during the day (Ambulatory and capable of all self care but unable to carry out any work activities. Up and about more than 50% of waking hours)  3 - Symptomatic, >50% in bed, but not bedbound (Capable of only limited self-care, confined to bed or chair 50% or more of waking hours)  4 - Bedbound (Completely disabled. Cannot carry on any self-care. Totally confined to bed or chair)  5 - Death   Santiago Glad MM, Creech RH, Tormey DC, et al. [phone]). "Toxicity and response criteria of the [facility]". Am. Evlyn Clines. Oncol. 5 (6): 649-55  LABORATORY DATA:  Lab Results  Component Value Date   WBC 14.5 (H)  04/30/2023   HGB 11.4 (L) 04/30/2023   HCT 34.2 (L) 04/30/2023   MCV 84.2 04/30/2023   PLT 148 (L) 04/30/2023   NEUTROABS 8.9 (H) 04/30/2023   Lab Results  Component Value Date   NA 135 06/05/2023   K 3.4 (L) 06/05/2023   CL 99 06/05/2023   CO2 27 06/05/2023   GLUCOSE 90 06/05/2023   BUN 10 06/05/2023   CREATININE 0.93 06/05/2023   CALCIUM 9.9 06/05/2023      RADIOGRAPHY: No results found.    IMPRESSION: S/p neoadjuvant chemoimmunotherapy and left breast mastectomy with left axillary TAD : Mixed invasive ductal and invasive lobular carcinoma of the left breast, along with DCIS and LCIS - clear margins and negative nodes -- Invasive lobular carcinoma : ER +/ PR+ /  Her2- / Grade 2.  -- Invasive ductal carcinoma : ER+ / PR+ / Her2 - / Grade 3.   ***  Today, I talked to the patient and family about the findings  and work-up thus far.  We discussed the natural history of *** and general treatment, highlighting the role of radiotherapy in the management.  We discussed the available radiation techniques, and focused on the details of logistics and delivery.  We reviewed the anticipated acute and late sequelae associated with radiation in this setting.  The patient was encouraged to ask questions that I answered to the best of my ability. *** A patient consent form was discussed and signed.  We retained a copy for our records.  The patient would like to proceed with radiation and will be scheduled for CT simulation.  PLAN: ***    *** minutes of total time was spent for this patient encounter, including preparation, face-to-face counseling with the patient and coordination of care, physical exam, and documentation of the encounter.   ------------------------------------------------  Billie Lade, PhD, MD  This document serves as a record of services personally performed by Antony Blackbird, MD. It was created on his behalf by Neena Rhymes, a trained medical scribe. The creation of this record is based on the scribe's personal observations and the provider's statements to them. This document has been checked and approved by the attending provider.

## 2023-07-09 ENCOUNTER — Encounter: Payer: Self-pay | Admitting: Radiation Oncology

## 2023-07-09 ENCOUNTER — Ambulatory Visit
Admission: RE | Admit: 2023-07-09 | Discharge: 2023-07-09 | Disposition: A | Discharge status: Home / Self Care | Payer: No Typology Code available for payment source | Source: Ambulatory Visit | Attending: Radiation Oncology | Admitting: Radiation Oncology

## 2023-07-09 ENCOUNTER — Other Ambulatory Visit: Payer: Self-pay

## 2023-07-09 VITALS — BP 127/96 | HR 109 | Temp 97.6°F | Resp 18 | Ht 65.0 in | Wt 161.0 lb

## 2023-07-09 DIAGNOSIS — C50912 Malignant neoplasm of unspecified site of left female breast: Secondary | ICD-10-CM

## 2023-07-10 ENCOUNTER — Ambulatory Visit (INDEPENDENT_AMBULATORY_CARE_PROVIDER_SITE_OTHER): Payer: No Typology Code available for payment source | Admitting: Family Medicine

## 2023-07-10 ENCOUNTER — Ambulatory Visit (HOSPITAL_BASED_OUTPATIENT_CLINIC_OR_DEPARTMENT_OTHER)
Admission: RE | Admit: 2023-07-10 | Discharge: 2023-07-10 | Disposition: A | Discharge status: Home / Self Care | Payer: No Typology Code available for payment source | Source: Ambulatory Visit | Attending: Family Medicine

## 2023-07-10 ENCOUNTER — Encounter: Payer: Self-pay | Admitting: Family Medicine

## 2023-07-10 VITALS — BP 134/93 | HR 98 | Ht 65.0 in | Wt 162.0 lb

## 2023-07-10 DIAGNOSIS — M25562 Pain in left knee: Secondary | ICD-10-CM | POA: Insufficient documentation

## 2023-07-10 NOTE — Progress Notes (Signed)
   Acute Office Visit  Subjective:     Patient ID: Briana [name], female    DOB: Oct 03, 1975, 47 y.o.   MRN: [id number]  Chief Complaint  Patient presents with   Knee Pain    Discussed the use of AI scribe software for clinical note transcription with the patient, who gave verbal consent to proceed.  History of Present Illness   The patient, with a recent history of breast cancer, underwent a mastectomy approximately a month ago. She reports minimal post-operative pain. Doing well overall. Starting radiation soon.   In addition to her cancer treatment, the patient reports persistent knee pain for 1-2 months at least. The pain is described as an 'achy' sensation, primarily located in the front of the left knee, sometimes radiating slightly upwards. The pain is intermittent, occurring multiple times a day, and often requires Tylenol for relief. The patient reports occasional popping in the knee, which sometimes provides temporary relief. The patient denies any specific injury or incident that may have triggered the pain. She also denies any swelling, instability, or locking of the knee. The pain is not associated with specific movements or activities and can occur even at rest.          ROS All review of systems negative except what is listed in the HPI      Objective:    BP (!) 134/93   Pulse 98   Ht 5\' 5"  (1.651 m)   Wt 162 lb (73.5 kg)   SpO2 100%   BMI 26.96 kg/m    Physical Exam Vitals reviewed.  Constitutional:      Appearance: Normal appearance.  Musculoskeletal:     Comments: Left knee with some pain on palpation of patellar tendon; no popping/clicking; normal ROM; negative anterior/posterior drawer, valgus/varus stress  Skin:    General: Skin is warm and dry.  Neurological:     Mental Status: She is alert and oriented to person, place, and time.     Gait: Gait normal.  Psychiatric:        Mood and Affect: Mood normal.        Behavior: Behavior normal.         Thought Content: Thought content normal.        Judgment: Judgment normal.      No results found for any visits on 07/10/23.      Assessment & Plan:   Problem List Items Addressed This Visit   None Visit Diagnoses     Acute pain of left knee    -  Primary   Relevant Orders   DG Knee 1-2 Views Left   Ambulatory referral to Sports Medicine     Xray today to look at the joint given duration of symptoms Start with heat, ice, rest, home exercises (handout provided) Continue over-the-counter pain management since this is working well.  If not making progress in the next 2-4 weeks, see sports medicine (referral has already been placed)  BP mildly elevated today. Patient to send list of readings in 2 weeks. Consider increasing HCTZ if still elevated. Patient aware of signs/symptoms requiring further/urgent evaluation.     No orders of the defined types were placed in this encounter.   Return if symptoms worsen or fail to improve.  Clayborne Dana, NP

## 2023-07-10 NOTE — Patient Instructions (Signed)
Xray today to look at the joint given duration of symptoms Start with heat, ice, rest, home exercises (handout provided) Continue over-the-counter pain management since this is working well.  If not making progress in the next 2-4 weeks, see sports medicine (referral has already been placed)

## 2023-07-14 ENCOUNTER — Ambulatory Visit: Payer: No Typology Code available for payment source | Admitting: Sports Medicine

## 2023-07-15 ENCOUNTER — Ambulatory Visit
Admission: RE | Admit: 2023-07-15 | Discharge: 2023-07-15 | Disposition: A | Discharge status: Home / Self Care | Payer: No Typology Code available for payment source | Source: Ambulatory Visit | Attending: Radiation Oncology | Admitting: Radiation Oncology

## 2023-07-15 DIAGNOSIS — Z510 Encounter for antineoplastic radiation therapy: Secondary | ICD-10-CM | POA: Diagnosis present

## 2023-07-15 DIAGNOSIS — C50912 Malignant neoplasm of unspecified site of left female breast: Secondary | ICD-10-CM | POA: Diagnosis present

## 2023-07-15 DIAGNOSIS — Z171 Estrogen receptor negative status [ER-]: Secondary | ICD-10-CM | POA: Diagnosis present

## 2023-07-20 ENCOUNTER — Inpatient Hospital Stay: Payer: No Typology Code available for payment source

## 2023-07-20 ENCOUNTER — Encounter: Payer: Self-pay | Admitting: Hematology & Oncology

## 2023-07-20 ENCOUNTER — Encounter: Payer: Self-pay | Admitting: *Deleted

## 2023-07-20 ENCOUNTER — Other Ambulatory Visit: Payer: Self-pay

## 2023-07-20 ENCOUNTER — Inpatient Hospital Stay: Payer: No Typology Code available for payment source | Attending: Hematology & Oncology

## 2023-07-20 ENCOUNTER — Inpatient Hospital Stay (HOSPITAL_BASED_OUTPATIENT_CLINIC_OR_DEPARTMENT_OTHER): Payer: No Typology Code available for payment source | Admitting: Hematology & Oncology

## 2023-07-20 VITALS — BP 117/90 | HR 103 | Temp 98.2°F | Resp 18 | Ht 65.0 in | Wt 160.0 lb

## 2023-07-20 DIAGNOSIS — Z79899 Other long term (current) drug therapy: Secondary | ICD-10-CM | POA: Diagnosis not present

## 2023-07-20 DIAGNOSIS — C773 Secondary and unspecified malignant neoplasm of axilla and upper limb lymph nodes: Secondary | ICD-10-CM | POA: Diagnosis not present

## 2023-07-20 DIAGNOSIS — Z9221 Personal history of antineoplastic chemotherapy: Secondary | ICD-10-CM | POA: Insufficient documentation

## 2023-07-20 DIAGNOSIS — Z853 Personal history of malignant neoplasm of breast: Secondary | ICD-10-CM | POA: Insufficient documentation

## 2023-07-20 DIAGNOSIS — C50912 Malignant neoplasm of unspecified site of left female breast: Secondary | ICD-10-CM

## 2023-07-20 DIAGNOSIS — Z21 Asymptomatic human immunodeficiency virus [HIV] infection status: Secondary | ICD-10-CM | POA: Insufficient documentation

## 2023-07-20 DIAGNOSIS — Z9012 Acquired absence of left breast and nipple: Secondary | ICD-10-CM | POA: Diagnosis not present

## 2023-07-20 DIAGNOSIS — Z79624 Long term (current) use of inhibitors of nucleotide synthesis: Secondary | ICD-10-CM | POA: Insufficient documentation

## 2023-07-20 LAB — CMP (CANCER CENTER ONLY)
ALT: 9 U/L (ref 0–44)
AST: 16 U/L (ref 15–41)
Albumin: 4.4 g/dL (ref 3.5–5.0)
Alkaline Phosphatase: 127 U/L — ABNORMAL HIGH (ref 38–126)
Anion gap: 9 (ref 5–15)
BUN: 9 mg/dL (ref 6–20)
CO2: 25 mmol/L (ref 22–32)
Calcium: 9.6 mg/dL (ref 8.9–10.3)
Chloride: 104 mmol/L (ref 98–111)
Creatinine: 0.75 mg/dL (ref 0.44–1.00)
GFR, Estimated: >60 mL/min (ref >60)
Glucose, Bld: 92 mg/dL (ref 70–99)
Potassium: 3 mmol/L — ABNORMAL LOW (ref 3.5–5.1)
Sodium: 138 mmol/L (ref 135–145)
Total Bilirubin: 0.3 mg/dL (ref <1.2)
Total Protein: 8.2 g/dL — ABNORMAL HIGH (ref 6.5–8.1)

## 2023-07-20 LAB — CBC WITH DIFFERENTIAL (CANCER CENTER ONLY)
Abs Immature Granulocytes: 0.02 10*3/uL (ref 0.00–0.07)
Basophils Absolute: 0 10*3/uL (ref 0.0–0.1)
Basophils Relative: 1 %
Eosinophils Absolute: 0.3 10*3/uL (ref 0.0–0.5)
Eosinophils Relative: 4 %
HCT: 32 % — ABNORMAL LOW (ref 36.0–46.0)
Hemoglobin: 10.7 g/dL — ABNORMAL LOW (ref 12.0–15.0)
Immature Granulocytes: 0 %
Lymphocytes Relative: 31 %
Lymphs Abs: 2.3 10*3/uL (ref 0.7–4.0)
MCH: 28.6 pg (ref 26.0–34.0)
MCHC: 33.4 g/dL (ref 30.0–36.0)
MCV: 85.6 fL (ref 80.0–100.0)
Monocytes Absolute: 0.6 10*3/uL (ref 0.1–1.0)
Monocytes Relative: 8 %
Neutro Abs: 4.1 10*3/uL (ref 1.7–7.7)
Neutrophils Relative %: 56 %
Platelet Count: 277 10*3/uL (ref 150–400)
RBC: 3.74 MIL/uL — ABNORMAL LOW (ref 3.87–5.11)
RDW: 12.3 % (ref 11.5–15.5)
WBC Count: 7.3 10*3/uL (ref 4.0–10.5)
nRBC: 0 % (ref 0.0–0.2)

## 2023-07-20 LAB — LACTATE DEHYDROGENASE: LDH: 195 U/L — ABNORMAL HIGH (ref 98–192)

## 2023-07-20 MED ORDER — HEPARIN SOD (PORK) LOCK FLUSH 100 UNIT/ML IV SOLN
500.0000 [IU] | Freq: Once | INTRAVENOUS | Status: AC
  Administered 2023-07-20: 500 [IU] via INTRAVENOUS

## 2023-07-20 MED ORDER — SODIUM CHLORIDE 0.9% FLUSH
10.0000 mL | INTRAVENOUS | Status: DC | PRN
  Administered 2023-07-20: 10 mL via INTRAVENOUS

## 2023-07-20 NOTE — Progress Notes (Signed)
Is here Hematology and Oncology Follow Up Visit  Briana [name] [id number] April 02, 1976 47 y.o. 07/20/2023   Principle Diagnosis:  Stage IIIC (T4N3M0) TRIPLE NEGATIVE infiltrating ductal carcinoma of the left breast HIV (+)  Current Therapy:   Neoadjuvant  chemoimmunotherapy --- carboplatin/Taxol/pembrolizumab ---s/p cycle #3 - start on 02/19/2023 S/p LEFT MRM - 06/08/2023 --  Residual breast cancer - 3/3 nodes (-) XRT -- Start on 07/22/2023 Xeloda - start AFTER XRT     Interim History:  Briana [name] is back for follow-up.  She is looking quite good.  She feels good.  She has noticed heart her radiation therapy this Thursday.  She had a mastectomy that was done on 06/08/2023.  The pathology report [phone]) showed that she had a very very good response.  She did have his residual 3.1 cm mixed ductal/lobular carcinoma.  All lymph nodes (3) were negative.  All margins were negative.  There was no lymphovascular space invasion.  I would have said that she did have a very nice response to neoadjuvant therapy.  She hopefully will be able to go back to work soon.  She works at a [employer].  She really would like to go back there.  She has had no problems with swelling of the left arm.  She is going to have breast reconstruction surgery about 3 months after the radiotherapy.  She has had no cough or shortness of breath.  She has had no nausea or vomiting.  She has had no change in bowel or bladder habits.  There has been no problems with bleeding.   She is HIV positive.  However, this is very well control.  She had no issues with respect to the chemo/immuno therapy that were used preop.  Overall, I would say her performance status is probably ECOG 0.       Medications:  Current Outpatient Medications:    amLODipine (NORVASC) 10 MG tablet, Take 1 tablet (10 mg total) by mouth daily., Disp: 90 tablet, Rfl: 1   bictegravir-emtricitabine-tenofovir AF (BIKTARVY) 50-200-25 MG  TABS tablet, TAKE 1 TABLET DAILY, Disp: 30 tablet, Rfl: 11   ferrous sulfate 325 (65 FE) MG EC tablet, Take 325 mg by mouth daily with breakfast., Disp: , Rfl:    hydrochlorothiazide (HYDRODIURIL) 12.5 MG tablet, Take 1 tablet (12.5 mg total) by mouth daily., Disp: 90 tablet, Rfl: 3   potassium chloride SA (KLOR-CON M) 20 MEQ tablet, Take 2 tablets (40 mEq total) by mouth 2 (two) times daily., Disp: 60 tablet, Rfl: 5   triamcinolone (KENALOG) 0.025 % ointment, APPLY TOPICALLY TWICE DAILY AS NEEDED, Disp: 454 g, Rfl: 1  Allergies:  Allergies  Allergen Reactions   Pumpkin Flavor Swelling    ONLY Lip swelling after eating pumpkin pie    Past Medical History, Surgical history, Social history, and Family History were reviewed and updated.  Review of Systems: Review of Systems  Constitutional: Negative.   HENT:  Negative.    Eyes: Negative.   Respiratory: Negative.    Cardiovascular: Negative.   Gastrointestinal: Negative.   Endocrine: Negative.   Genitourinary: Negative.    Musculoskeletal: Negative.   Skin: Negative.   Neurological: Negative.   Hematological: Negative.   Psychiatric/Behavioral: Negative.      Physical Exam:  height is 5\' 5"  (1.651 m) and weight is 160 lb (72.6 kg). Her oral temperature is 98.2 F (36.8 C). Her blood pressure is 117/90 (abnormal) and her pulse is 103 (abnormal). Her respiration is 18 and  oxygen saturation is 100%.   Wt Readings from Last 3 Encounters:  07/20/23 160 lb (72.6 kg)  07/10/23 162 lb (73.5 kg)  07/09/23 161 lb (73 kg)    Physical Exam Vitals reviewed.  Constitutional:      Comments: Right breast shows no masses edema or erythema.  There is no right axillary adenopathy.  Left breast really does not show any masses.  I cannot palpate any distinct mass in the left breast now.  I cannot palpate any left axilla lymph nodes.    HENT:     Head: Normocephalic and atraumatic.  Eyes:     Pupils: Pupils are equal, round, and reactive to  light.  Cardiovascular:     Rate and Rhythm: Normal rate and regular rhythm.     Heart sounds: Normal heart sounds.  Pulmonary:     Effort: Pulmonary effort is normal.     Breath sounds: Normal breath sounds.  Abdominal:     General: Bowel sounds are normal.     Palpations: Abdomen is soft.  Musculoskeletal:        General: No tenderness or deformity. Normal range of motion.     Cervical back: Normal range of motion.  Lymphadenopathy:     Cervical: No cervical adenopathy.  Skin:    General: Skin is warm and dry.     Findings: No erythema or rash.  Neurological:     Mental Status: She is alert and oriented to person, place, and time.  Psychiatric:        Behavior: Behavior normal.        Thought Content: Thought content normal.        Judgment: Judgment normal.      Lab Results  Component Value Date   WBC 14.5 (H) 04/30/2023   HGB 11.4 (L) 04/30/2023   HCT 34.2 (L) 04/30/2023   MCV 84.2 04/30/2023   PLT 148 (L) 04/30/2023     Chemistry      Component Value Date/Time   NA 135 06/05/2023 1130   K 3.4 (L) 06/05/2023 1130   CL 99 06/05/2023 1130   CO2 27 06/05/2023 1130   BUN 10 06/05/2023 1130   CREATININE 0.93 06/05/2023 1130   CREATININE 0.77 04/30/2023 0815   CREATININE 0.62 10/17/2022 0947      Component Value Date/Time   CALCIUM 9.9 06/05/2023 1130   ALKPHOS 105 04/30/2023 0815   AST 18 04/30/2023 0815   ALT 35 04/30/2023 0815   BILITOT 0.2 (L) 04/30/2023 0815     Impression and Plan: Briana [name] is a very nice 47 year old premenopausal Afro-American female with TRIPLE NEGATIVE breast cancer.  She has aggressive disease in my opinion.  She completed all of her had neoadjuvant chemotherapy.  She did very well with the chemotherapy/immunotherapy.  In my opinion, I think she really had a good response.  She now is gone have radiotherapy.  I do think she would be a good candidate for Xeloda after radiotherapy.  I talked to her about this..  She is going to  have some more reconstruction for the left breast.  We will have to arrange for this to be done with regard to her adjuvant Xeloda.  The Xeloda in the adjuvant setting would be benefit for her.  This is from the CREATE-X trial.  I will probably give her 6 cycles of Xeloda once radiotherapy is completed.  I would like to have her come back to see [place] in about 4 weeks or so.  By then, she should be close to finishing radiotherapy.   Josph Macho, MD 11/18/202411:02 AM

## 2023-07-20 NOTE — Progress Notes (Signed)
Patient has recovered well from surgery. She is now scheduled to start radiation on 07/23/2023. After radiation, she will receive 6 cycles of adjuvant Xeloda.  Oncology Nurse Navigator Documentation     07/20/2023   10:30 AM  Oncology Nurse Navigator Flowsheets  Phase of Treatment Radiation  Radiation Actual Start Date: 07/23/2023  Radiation Expected End Date: 09/10/2023  Navigator Follow Up Date: 08/31/2023  Navigator Follow Up Reason: Follow-up Appointment  Navigator Location [facility]  Navigator Encounter Type Follow-up Appt;Appt/Treatment Plan Review  Patient Visit Type MedOnc  Treatment Phase Active Tx  Barriers/Navigation Needs No Barriers At This Time  Interventions Psycho-Social Support  Acuity Level 1-No Barriers  Support Groups/Services Friends and Family  Time Spent with Patient 15

## 2023-07-20 NOTE — Patient Instructions (Signed)

## 2023-07-21 ENCOUNTER — Ambulatory Visit: Payer: No Typology Code available for payment source | Admitting: Sports Medicine

## 2023-07-21 LAB — CANCER ANTIGEN 27.29: CA 27.29: <9 U/mL (ref 0.0–38.6)

## 2023-07-22 DIAGNOSIS — Z510 Encounter for antineoplastic radiation therapy: Secondary | ICD-10-CM | POA: Diagnosis not present

## 2023-07-23 ENCOUNTER — Other Ambulatory Visit: Payer: Self-pay

## 2023-07-23 ENCOUNTER — Ambulatory Visit
Admission: RE | Admit: 2023-07-23 | Discharge: 2023-07-23 | Disposition: A | Discharge status: Home / Self Care | Payer: No Typology Code available for payment source | Source: Ambulatory Visit | Attending: Radiation Oncology | Admitting: Radiation Oncology

## 2023-07-23 DIAGNOSIS — Z510 Encounter for antineoplastic radiation therapy: Secondary | ICD-10-CM | POA: Diagnosis not present

## 2023-07-23 DIAGNOSIS — C50912 Malignant neoplasm of unspecified site of left female breast: Secondary | ICD-10-CM

## 2023-07-23 LAB — RAD ONC ARIA SESSION SUMMARY

## 2023-07-24 ENCOUNTER — Ambulatory Visit
Admission: RE | Admit: 2023-07-24 | Discharge: 2023-07-24 | Disposition: A | Discharge status: Home / Self Care | Payer: No Typology Code available for payment source | Source: Ambulatory Visit | Attending: Radiation Oncology | Admitting: Radiation Oncology

## 2023-07-24 ENCOUNTER — Other Ambulatory Visit: Payer: Self-pay

## 2023-07-24 DIAGNOSIS — Z510 Encounter for antineoplastic radiation therapy: Secondary | ICD-10-CM | POA: Diagnosis not present

## 2023-07-24 LAB — RAD ONC ARIA SESSION SUMMARY

## 2023-07-26 ENCOUNTER — Ambulatory Visit
Admission: RE | Admit: 2023-07-26 | Discharge: 2023-07-26 | Disposition: A | Discharge status: Home / Self Care | Payer: No Typology Code available for payment source | Source: Ambulatory Visit | Attending: Radiation Oncology | Admitting: Radiation Oncology

## 2023-07-26 ENCOUNTER — Ambulatory Visit: Payer: No Typology Code available for payment source

## 2023-07-26 ENCOUNTER — Other Ambulatory Visit: Payer: Self-pay

## 2023-07-26 DIAGNOSIS — Z510 Encounter for antineoplastic radiation therapy: Secondary | ICD-10-CM | POA: Diagnosis not present

## 2023-07-26 LAB — RAD ONC ARIA SESSION SUMMARY
Course Elapsed Days: 3
Plan Fractions Treated to Date: 2
Plan Fractions Treated to Date: 3
Plan Prescribed Dose Per Fraction: 1.8 Gy
Plan Prescribed Dose Per Fraction: 1.8 Gy
Plan Total Fractions Prescribed: 14
Plan Total Fractions Prescribed: 28
Plan Total Prescribed Dose: 25.2 Gy
Plan Total Prescribed Dose: 50.4 Gy
Reference Point Dosage Given to Date: 3.6 Gy
Reference Point Dosage Given to Date: 5.4 Gy
Reference Point Session Dosage Given: 1.8 Gy
Reference Point Session Dosage Given: 1.8 Gy
Session Number: 3

## 2023-07-27 ENCOUNTER — Ambulatory Visit
Admission: RE | Admit: 2023-07-27 | Discharge: 2023-07-27 | Disposition: A | Discharge status: Home / Self Care | Payer: No Typology Code available for payment source | Source: Ambulatory Visit | Attending: Radiation Oncology | Admitting: Radiation Oncology

## 2023-07-27 ENCOUNTER — Ambulatory Visit: Payer: No Typology Code available for payment source

## 2023-07-27 ENCOUNTER — Other Ambulatory Visit: Payer: Self-pay

## 2023-07-27 DIAGNOSIS — Z510 Encounter for antineoplastic radiation therapy: Secondary | ICD-10-CM | POA: Diagnosis not present

## 2023-07-27 LAB — RAD ONC ARIA SESSION SUMMARY
Course Elapsed Days: 4
Plan Fractions Treated to Date: 2
Plan Fractions Treated to Date: 4
Plan Prescribed Dose Per Fraction: 1.8 Gy
Plan Prescribed Dose Per Fraction: 1.8 Gy
Plan Total Fractions Prescribed: 14
Plan Total Fractions Prescribed: 28
Plan Total Prescribed Dose: 25.2 Gy
Plan Total Prescribed Dose: 50.4 Gy
Reference Point Dosage Given to Date: 3.6 Gy
Reference Point Dosage Given to Date: 7.2 Gy
Reference Point Session Dosage Given: 1.8 Gy
Reference Point Session Dosage Given: 1.8 Gy
Session Number: 4

## 2023-07-28 ENCOUNTER — Ambulatory Visit
Admission: RE | Admit: 2023-07-28 | Discharge: 2023-07-28 | Disposition: A | Discharge status: Home / Self Care | Payer: No Typology Code available for payment source | Source: Ambulatory Visit | Attending: Radiation Oncology | Admitting: Radiation Oncology

## 2023-07-28 ENCOUNTER — Other Ambulatory Visit: Payer: Self-pay

## 2023-07-28 DIAGNOSIS — C50412 Malignant neoplasm of upper-outer quadrant of left female breast: Secondary | ICD-10-CM

## 2023-07-28 DIAGNOSIS — Z510 Encounter for antineoplastic radiation therapy: Secondary | ICD-10-CM | POA: Diagnosis not present

## 2023-07-28 LAB — RAD ONC ARIA SESSION SUMMARY
Course Elapsed Days: 5
Plan Fractions Treated to Date: 3
Plan Fractions Treated to Date: 5
Plan Prescribed Dose Per Fraction: 1.8 Gy
Plan Prescribed Dose Per Fraction: 1.8 Gy
Plan Total Fractions Prescribed: 14
Plan Total Fractions Prescribed: 28
Plan Total Prescribed Dose: 25.2 Gy
Plan Total Prescribed Dose: 50.4 Gy
Reference Point Dosage Given to Date: 5.4 Gy
Reference Point Dosage Given to Date: 9 Gy
Reference Point Session Dosage Given: 1.8 Gy
Reference Point Session Dosage Given: 1.8 Gy
Session Number: 5

## 2023-07-28 MED ORDER — RADIAPLEXRX EX GEL: Freq: Once | CUTANEOUS | Status: AC

## 2023-07-28 MED ORDER — ALRA NON-METALLIC DEODORANT (RAD-ONC)
1.0000 | Freq: Once | TOPICAL | Status: AC
  Administered 2023-07-28: 1 via TOPICAL

## 2023-07-29 ENCOUNTER — Other Ambulatory Visit: Payer: Self-pay

## 2023-07-29 ENCOUNTER — Ambulatory Visit
Admission: RE | Admit: 2023-07-29 | Discharge: 2023-07-29 | Disposition: A | Discharge status: Home / Self Care | Payer: No Typology Code available for payment source | Source: Ambulatory Visit | Attending: Radiation Oncology | Admitting: Radiation Oncology

## 2023-07-29 DIAGNOSIS — Z510 Encounter for antineoplastic radiation therapy: Secondary | ICD-10-CM | POA: Diagnosis not present

## 2023-07-29 LAB — RAD ONC ARIA SESSION SUMMARY
Course Elapsed Days: 6
Plan Fractions Treated to Date: 3
Plan Fractions Treated to Date: 6
Plan Prescribed Dose Per Fraction: 1.8 Gy
Plan Prescribed Dose Per Fraction: 1.8 Gy
Plan Total Fractions Prescribed: 14
Plan Total Fractions Prescribed: 28
Plan Total Prescribed Dose: 25.2 Gy
Plan Total Prescribed Dose: 50.4 Gy
Reference Point Dosage Given to Date: 10.8 Gy
Reference Point Dosage Given to Date: 5.4 Gy
Reference Point Session Dosage Given: 1.8 Gy
Reference Point Session Dosage Given: 1.8 Gy
Session Number: 6

## 2023-08-02 ENCOUNTER — Encounter (HOSPITAL_BASED_OUTPATIENT_CLINIC_OR_DEPARTMENT_OTHER): Payer: Self-pay

## 2023-08-02 ENCOUNTER — Emergency Department (HOSPITAL_BASED_OUTPATIENT_CLINIC_OR_DEPARTMENT_OTHER)
Admission: EM | Admit: 2023-08-02 | Discharge: 2023-08-02 | Disposition: A | Discharge status: Home / Self Care | Payer: No Typology Code available for payment source | Attending: Emergency Medicine | Admitting: Emergency Medicine

## 2023-08-02 ENCOUNTER — Ambulatory Visit: Payer: No Typology Code available for payment source

## 2023-08-02 ENCOUNTER — Other Ambulatory Visit: Payer: Self-pay

## 2023-08-02 DIAGNOSIS — L309 Dermatitis, unspecified: Secondary | ICD-10-CM | POA: Insufficient documentation

## 2023-08-02 DIAGNOSIS — Z21 Asymptomatic human immunodeficiency virus [HIV] infection status: Secondary | ICD-10-CM | POA: Diagnosis not present

## 2023-08-02 DIAGNOSIS — R21 Rash and other nonspecific skin eruption: Secondary | ICD-10-CM | POA: Diagnosis present

## 2023-08-02 DIAGNOSIS — R000 Tachycardia, unspecified: Secondary | ICD-10-CM | POA: Diagnosis not present

## 2023-08-02 DIAGNOSIS — B029 Zoster without complications: Secondary | ICD-10-CM | POA: Insufficient documentation

## 2023-08-02 DIAGNOSIS — E876 Hypokalemia: Secondary | ICD-10-CM | POA: Insufficient documentation

## 2023-08-02 LAB — I-STAT CHEM 8, ED
BUN: 17 mg/dL (ref 6–20)
Calcium, Ion: 1.08 mmol/L — ABNORMAL LOW (ref 1.15–1.40)
Chloride: 97 mmol/L — ABNORMAL LOW (ref 98–111)
Creatinine, Ser: 1 mg/dL (ref 0.44–1.00)
Glucose, Bld: 122 mg/dL — ABNORMAL HIGH (ref 70–99)
HCT: 38 % (ref 36.0–46.0)
Hemoglobin: 12.9 g/dL (ref 12.0–15.0)
Potassium: 2.6 mmol/L — CL (ref 3.5–5.1)
Sodium: 133 mmol/L — ABNORMAL LOW (ref 135–145)
TCO2: 21 mmol/L — ABNORMAL LOW (ref 22–32)

## 2023-08-02 LAB — HEPATIC FUNCTION PANEL
ALT: 21 U/L (ref 0–44)
AST: 38 U/L (ref 15–41)
Albumin: 4 g/dL (ref 3.5–5.0)
Alkaline Phosphatase: 134 U/L — ABNORMAL HIGH (ref 38–126)
Bilirubin, Direct: 0.1 mg/dL (ref 0.0–0.2)
Indirect Bilirubin: 0.7 mg/dL (ref 0.3–0.9)
Total Bilirubin: 0.8 mg/dL (ref <1.2)
Total Protein: 8.8 g/dL — ABNORMAL HIGH (ref 6.5–8.1)

## 2023-08-02 LAB — CBC WITH DIFFERENTIAL/PLATELET
Abs Immature Granulocytes: 0.02 10*3/uL (ref 0.00–0.07)
Basophils Absolute: 0 10*3/uL (ref 0.0–0.1)
Basophils Relative: 0 %
Eosinophils Absolute: 0 10*3/uL (ref 0.0–0.5)
Eosinophils Relative: 1 %
HCT: 35 % — ABNORMAL LOW (ref 36.0–46.0)
Hemoglobin: 11.7 g/dL — ABNORMAL LOW (ref 12.0–15.0)
Immature Granulocytes: 0 %
Lymphocytes Relative: 21 %
Lymphs Abs: 1.3 10*3/uL (ref 0.7–4.0)
MCH: 27.3 pg (ref 26.0–34.0)
MCHC: 33.4 g/dL (ref 30.0–36.0)
MCV: 81.8 fL (ref 80.0–100.0)
Monocytes Absolute: 0.8 10*3/uL (ref 0.1–1.0)
Monocytes Relative: 13 %
Neutro Abs: 4 10*3/uL (ref 1.7–7.7)
Neutrophils Relative %: 65 %
Platelets: 204 10*3/uL (ref 150–400)
RBC: 4.28 MIL/uL (ref 3.87–5.11)
RDW: 12.4 % (ref 11.5–15.5)
WBC: 6.1 10*3/uL (ref 4.0–10.5)
nRBC: 0 % (ref 0.0–0.2)

## 2023-08-02 LAB — MAGNESIUM: Magnesium: 2.1 mg/dL (ref 1.7–2.4)

## 2023-08-02 LAB — HCG, SERUM, QUALITATIVE: Preg, Serum: NEGATIVE

## 2023-08-02 MED ORDER — IBUPROFEN 400 MG PO TABS
600.0000 mg | ORAL_TABLET | Freq: Once | ORAL | Status: AC
  Administered 2023-08-02: 600 mg via ORAL
  Filled 2023-08-02: qty 1

## 2023-08-02 MED ORDER — HYDROCODONE-ACETAMINOPHEN 5-325 MG PO TABS: 1.0000 | ORAL_TABLET | ORAL | 0 refills | Status: DC | PRN

## 2023-08-02 MED ORDER — LACTATED RINGERS IV BOLUS
1000.0000 mL | Freq: Once | INTRAVENOUS | Status: AC
  Administered 2023-08-02: 1000 mL via INTRAVENOUS

## 2023-08-02 MED ORDER — VALACYCLOVIR HCL 500 MG PO TABS
1000.0000 mg | ORAL_TABLET | Freq: Once | ORAL | Status: AC
  Administered 2023-08-02: 1000 mg via ORAL
  Filled 2023-08-02: qty 2

## 2023-08-02 MED ORDER — VALACYCLOVIR HCL 1 G PO TABS: 1000.0000 mg | ORAL_TABLET | Freq: Three times a day (TID) | ORAL | 0 refills | Status: DC

## 2023-08-02 MED ORDER — POTASSIUM CHLORIDE CRYS ER 20 MEQ PO TBCR: 40.0000 meq | EXTENDED_RELEASE_TABLET | Freq: Two times a day (BID) | ORAL | 0 refills | Status: DC

## 2023-08-02 MED ORDER — POTASSIUM CHLORIDE CRYS ER 20 MEQ PO TBCR
40.0000 meq | EXTENDED_RELEASE_TABLET | Freq: Once | ORAL | Status: AC
  Administered 2023-08-02: 40 meq via ORAL
  Filled 2023-08-02: qty 2

## 2023-08-02 NOTE — ED Triage Notes (Signed)
Pt c/o blisters on her right upper back coming around to under her right breast area. Pt does receive radiation for breast CA on the left side. Denies any fevers.

## 2023-08-02 NOTE — ED Provider Notes (Signed)
Briana [name] Provider Note   CSN: [id number] Arrival date & time: 08/02/23  7829     History  Chief Complaint  Patient presents with   Blister    Briana [name] is a 47 y.o. female.  HPI 47 year old female with a history of HIV presents with a rash and pain.  Starts in her right back and wraps around to her right chest.  Has been there starting 2 days ago.  No fevers, chest pain, shortness of breath.  She has chronic eczema for which she follows with dermatology and is currently on prednisone for this.  The rash is painful, an 8 out of 10 at baseline but worse if something touches it.  Tylenol has not provided relief.  She does not have any other rash that is new besides her eczema and no oral lesions.  She otherwise denies any recent illness including no fever, cough, vomiting, diarrhea, etc.  Took all of her typical morning meds this morning.  Home Medications Prior to Admission medications   Medication Sig Start Date End Date Taking? Authorizing Provider  HYDROcodone-acetaminophen (NORCO) 5-325 MG tablet Take 1 tablet by mouth every 4 (four) hours as needed for severe pain (pain score 7-10). 08/02/23  Yes Pricilla Loveless, MD  valACYclovir (VALTREX) 1000 MG tablet Take 1 tablet (1,000 mg total) by mouth 3 (three) times daily. 08/02/23  Yes Pricilla Loveless, MD  amLODipine (NORVASC) 10 MG tablet Take 1 tablet (10 mg total) by mouth daily. 02/05/23   Clayborne Dana, NP  bictegravir-emtricitabine-tenofovir AF (BIKTARVY) 50-200-25 MG TABS tablet TAKE 1 TABLET DAILY 01/08/23   Comer, Belia Heman, MD  ferrous sulfate 325 (65 FE) MG EC tablet Take 325 mg by mouth daily with breakfast.    [provider]  hydrochlorothiazide (HYDRODIURIL) 12.5 MG tablet Take 1 tablet (12.5 mg total) by mouth daily. 02/11/23   Clayborne Dana, NP  potassium chloride SA (KLOR-CON M) 20 MEQ tablet Take 2 tablets (40 mEq total) by mouth 2 (two) times daily. 08/02/23    Pricilla Loveless, MD  triamcinolone (KENALOG) 0.025 % ointment APPLY TOPICALLY TWICE DAILY AS NEEDED 01/19/23   Comer, Belia Heman, MD      Allergies    Pumpkin flavor    Review of Systems   Review of Systems  Constitutional:  Negative for fever.  Respiratory:  Negative for cough and shortness of breath.   Cardiovascular:  Negative for chest pain.  Gastrointestinal:  Negative for diarrhea and vomiting.  Skin:  Positive for rash.    Physical Exam Updated Vital Signs BP (!) 134/91 (BP Location: Right Arm)   Pulse 97   Temp 98 F (36.7 C) (Oral)   Resp 18   SpO2 99%  Physical Exam Vitals and nursing note reviewed.  Constitutional:      General: She is not in acute distress.    Appearance: She is well-developed. She is not ill-appearing or diaphoretic.     Comments: Comfortable, resting  HENT:     Head: Normocephalic and atraumatic.     Mouth/Throat:     Comments: No oral lesions Cardiovascular:     Rate and Rhythm: Regular rhythm. Tachycardia present.     Heart sounds: Normal heart sounds.  Pulmonary:     Effort: Pulmonary effort is normal.     Breath sounds: Normal breath sounds.  Abdominal:     Palpations: Abdomen is soft.     Tenderness: There is no abdominal tenderness.  Skin:    General: Skin is warm and dry.     Comments: Patient has diffuse dry skin/eczema.  However on her right thoracic back wrapping around towards her right chest she has what appears to be a zoster rash.  See the picture.  Neurological:     Mental Status: She is alert.     ED Results / Procedures / Treatments   Labs (all labs ordered are listed, but only abnormal results are displayed) Labs Reviewed  CBC WITH DIFFERENTIAL/PLATELET - Abnormal; Notable for the following components:      Result Value   Hemoglobin 11.7 (*)    HCT 35.0 (*)    All other components within normal limits  HEPATIC FUNCTION PANEL - Abnormal; Notable for the following components:   Total Protein 8.8 (*)    Alkaline  Phosphatase 134 (*)    All other components within normal limits  I-STAT CHEM 8, ED - Abnormal; Notable for the following components:   Sodium 133 (*)    Potassium 2.6 (*)    Chloride 97 (*)    Glucose, Bld 122 (*)    Calcium, Ion 1.08 (*)    TCO2 21 (*)    All other components within normal limits  HCG, SERUM, QUALITATIVE  MAGNESIUM    EKG EKG Interpretation Date/Time:  Sunday August 02 2023 08:14:01 EST Ventricular Rate:  105 PR Interval:  142 QRS Duration:  86 QT Interval:  337 QTC Calculation: 446 R Axis:   42  Text Interpretation: Sinus tachycardia Probable left atrial enlargement HR is faster, otherwise unchanged since Oct 2024 Confirmed by Pricilla Loveless [phone]) on 08/02/2023 8:32:03 AM  Radiology No results found.  Procedures Procedures    Medications Ordered in ED Medications  lactated ringers bolus 1,000 mL (1,000 mLs Intravenous New Bag/Given 08/02/23 0806)  ibuprofen (ADVIL) tablet 600 mg (600 mg Oral Given 08/02/23 0813)  valACYclovir (VALTREX) tablet 1,000 mg (1,000 mg Oral Given 08/02/23 0812)  potassium chloride SA (KLOR-CON M) CR tablet 40 mEq (40 mEq Oral Given 08/02/23 0840)    ED Course/ Medical Decision Making/ A&P                                 Medical Decision Making Amount and/or Complexity of Data Reviewed External Data Reviewed: labs and notes.    Details: Last CD4 over 500, viral load undetectable Labs: ordered.    Details: Potassium of 2.6.  Normal WBC.  Unremarkable renal function. ECG/medicine tests: ordered and independent interpretation performed.    Details: Tachycardia without QTc prolongation.  Risk Prescription drug management.   Patient presents with a rash that seems consistent with zoster.  She has HIV but has been compliant with her meds and her last CD4 was over 500 and her viral load was undetectable.  I do not think she has any symptoms that would indicate disseminated zoster.  Her heart rate is elevated on exam but  after some fluids and pain control with ibuprofen (she drove), her heart rate has come down to 97 on my most recent exam.  She feels fine.  She does have hypokalemia but it seems like this is a recurrent problem and when I talk to her she states she has run out of her most recent prescription and she is unsure when the last time she took it was.  Will give her oral replacement here and replace it as an outpatient.  She  otherwise appears stable for discharge home to follow-up with PCP and ID.  Will treat with valacyclovir.        Final Clinical Impression(s) / ED Diagnoses Final diagnoses:  Herpes zoster without complication  Hypokalemia    Rx / DC Orders ED Discharge Orders          Ordered    potassium chloride SA (KLOR-CON M) 20 MEQ tablet  2 times daily        08/02/23 0910    valACYclovir (VALTREX) 1000 MG tablet  3 times daily        08/02/23 0910    HYDROcodone-acetaminophen (NORCO) 5-325 MG tablet  Every 4 hours PRN        08/02/23 0910              Pricilla Loveless, MD 08/02/23 [phone]

## 2023-08-02 NOTE — Discharge Instructions (Signed)
You have an infection called shingles.  This is contagious, especially to people whose immune system is not strong, pregnant women, and people who have never had shingles or chickenpox before.  Please cover up the rash when you are around other people.  Your potassium is low, we are refilling your potassium prescription but you will also need to follow-up with your primary care physician for more long-term potassium prescription.  You need to get your potassium rechecked, ideally this week.  We are prescribing you antiviral medicine to treat the shingles as well as hydrocodone for severe pain.  Do not drive or.  Heavy machinery while on this.  If you develop fever, new or worsening pain, or any other new/concerning symptoms then return to the ER or call 911.

## 2023-08-02 NOTE — ED Notes (Signed)
Panic chem8 result given to Dr Criss Alvine before results uploaded.

## 2023-08-03 ENCOUNTER — Ambulatory Visit: Payer: No Typology Code available for payment source

## 2023-08-03 ENCOUNTER — Telehealth: Payer: Self-pay

## 2023-08-03 DIAGNOSIS — Z171 Estrogen receptor negative status [ER-]: Secondary | ICD-10-CM | POA: Insufficient documentation

## 2023-08-03 DIAGNOSIS — C50912 Malignant neoplasm of unspecified site of left female breast: Secondary | ICD-10-CM | POA: Insufficient documentation

## 2023-08-03 DIAGNOSIS — Z510 Encounter for antineoplastic radiation therapy: Secondary | ICD-10-CM | POA: Insufficient documentation

## 2023-08-03 NOTE — Telephone Encounter (Signed)
Patient called in to report being diagnosed with Shingles on yesterday. Patient reports rash being on her right back extending around to breast. Patient asking if she can come in for treatment. Per radiation therapist patient may come in and will be contact and airborne precautions will be started. Patient voiced understanding.

## 2023-08-04 ENCOUNTER — Ambulatory Visit
Admission: RE | Admit: 2023-08-04 | Discharge: 2023-08-04 | Disposition: A | Discharge status: Home / Self Care | Payer: No Typology Code available for payment source | Source: Ambulatory Visit | Attending: Radiation Oncology | Admitting: Radiation Oncology

## 2023-08-04 ENCOUNTER — Ambulatory Visit: Payer: No Typology Code available for payment source

## 2023-08-04 ENCOUNTER — Other Ambulatory Visit: Payer: Self-pay

## 2023-08-04 DIAGNOSIS — Z21 Asymptomatic human immunodeficiency virus [HIV] infection status: Secondary | ICD-10-CM | POA: Diagnosis present

## 2023-08-04 DIAGNOSIS — C50912 Malignant neoplasm of unspecified site of left female breast: Secondary | ICD-10-CM | POA: Insufficient documentation

## 2023-08-04 DIAGNOSIS — Z79899 Other long term (current) drug therapy: Secondary | ICD-10-CM | POA: Insufficient documentation

## 2023-08-04 DIAGNOSIS — Z510 Encounter for antineoplastic radiation therapy: Secondary | ICD-10-CM | POA: Diagnosis present

## 2023-08-04 DIAGNOSIS — Z171 Estrogen receptor negative status [ER-]: Secondary | ICD-10-CM | POA: Insufficient documentation

## 2023-08-04 LAB — RAD ONC ARIA SESSION SUMMARY
Course Elapsed Days: 12
Plan Fractions Treated to Date: 4
Plan Fractions Treated to Date: 7
Plan Prescribed Dose Per Fraction: 1.8 Gy
Plan Prescribed Dose Per Fraction: 1.8 Gy
Plan Total Fractions Prescribed: 14
Plan Total Fractions Prescribed: 28
Plan Total Prescribed Dose: 25.2 Gy
Plan Total Prescribed Dose: 50.4 Gy
Reference Point Dosage Given to Date: 12.6 Gy
Reference Point Dosage Given to Date: 7.2 Gy
Reference Point Session Dosage Given: 1.8 Gy
Reference Point Session Dosage Given: 1.8 Gy
Session Number: 7

## 2023-08-05 ENCOUNTER — Ambulatory Visit
Admission: RE | Admit: 2023-08-05 | Discharge: 2023-08-05 | Disposition: A | Discharge status: Home / Self Care | Payer: No Typology Code available for payment source | Source: Ambulatory Visit | Attending: Radiation Oncology | Admitting: Radiation Oncology

## 2023-08-05 ENCOUNTER — Other Ambulatory Visit: Payer: Self-pay

## 2023-08-05 DIAGNOSIS — Z21 Asymptomatic human immunodeficiency virus [HIV] infection status: Secondary | ICD-10-CM | POA: Diagnosis not present

## 2023-08-05 LAB — RAD ONC ARIA SESSION SUMMARY
Course Elapsed Days: 13
Plan Fractions Treated to Date: 4
Plan Fractions Treated to Date: 8
Plan Prescribed Dose Per Fraction: 1.8 Gy
Plan Prescribed Dose Per Fraction: 1.8 Gy
Plan Total Fractions Prescribed: 14
Plan Total Fractions Prescribed: 28
Plan Total Prescribed Dose: 25.2 Gy
Plan Total Prescribed Dose: 50.4 Gy
Reference Point Dosage Given to Date: 14.4 Gy
Reference Point Dosage Given to Date: 7.2 Gy
Reference Point Session Dosage Given: 1.8 Gy
Reference Point Session Dosage Given: 1.8 Gy
Session Number: 8

## 2023-08-06 ENCOUNTER — Ambulatory Visit
Admission: RE | Admit: 2023-08-06 | Discharge: 2023-08-06 | Disposition: A | Discharge status: Home / Self Care | Payer: No Typology Code available for payment source | Source: Ambulatory Visit | Attending: Radiation Oncology | Admitting: Radiation Oncology

## 2023-08-06 ENCOUNTER — Other Ambulatory Visit: Payer: Self-pay

## 2023-08-06 DIAGNOSIS — Z21 Asymptomatic human immunodeficiency virus [HIV] infection status: Secondary | ICD-10-CM | POA: Diagnosis not present

## 2023-08-06 LAB — RAD ONC ARIA SESSION SUMMARY
Course Elapsed Days: 14
Plan Fractions Treated to Date: 5
Plan Prescribed Dose Per Fraction: 1.8 Gy
Plan Total Fractions Prescribed: 14
Plan Total Prescribed Dose: 25.2 Gy
Reference Point Dosage Given to Date: 9 Gy
Reference Point Session Dosage Given: 0.3968 Gy
Session Number: 9

## 2023-08-07 ENCOUNTER — Other Ambulatory Visit: Payer: Self-pay

## 2023-08-07 ENCOUNTER — Ambulatory Visit
Admission: RE | Admit: 2023-08-07 | Discharge: 2023-08-07 | Disposition: A | Discharge status: Home / Self Care | Payer: No Typology Code available for payment source | Source: Ambulatory Visit | Attending: Radiation Oncology | Admitting: Radiation Oncology

## 2023-08-07 DIAGNOSIS — Z21 Asymptomatic human immunodeficiency virus [HIV] infection status: Secondary | ICD-10-CM | POA: Diagnosis not present

## 2023-08-07 LAB — RAD ONC ARIA SESSION SUMMARY
Course Elapsed Days: 15
Plan Fractions Treated to Date: 10
Plan Fractions Treated to Date: 5
Plan Prescribed Dose Per Fraction: 1.8 Gy
Plan Prescribed Dose Per Fraction: 1.8 Gy
Plan Total Fractions Prescribed: 14
Plan Total Fractions Prescribed: 28
Plan Total Prescribed Dose: 25.2 Gy
Plan Total Prescribed Dose: 50.4 Gy
Reference Point Dosage Given to Date: 18 Gy
Reference Point Dosage Given to Date: 9 Gy
Reference Point Session Dosage Given: 1.8 Gy
Reference Point Session Dosage Given: 1.8 Gy
Session Number: 10

## 2023-08-10 ENCOUNTER — Ambulatory Visit: Payer: No Typology Code available for payment source

## 2023-08-10 ENCOUNTER — Ambulatory Visit
Admission: RE | Admit: 2023-08-10 | Discharge: 2023-08-10 | Disposition: A | Discharge status: Home / Self Care | Payer: No Typology Code available for payment source | Source: Ambulatory Visit | Attending: Radiation Oncology | Admitting: Radiation Oncology

## 2023-08-10 ENCOUNTER — Other Ambulatory Visit: Payer: Self-pay

## 2023-08-10 DIAGNOSIS — Z21 Asymptomatic human immunodeficiency virus [HIV] infection status: Secondary | ICD-10-CM | POA: Diagnosis not present

## 2023-08-10 LAB — RAD ONC ARIA SESSION SUMMARY
Course Elapsed Days: 18
Plan Fractions Treated to Date: 11
Plan Fractions Treated to Date: 6
Plan Prescribed Dose Per Fraction: 1.8 Gy
Plan Prescribed Dose Per Fraction: 1.8 Gy
Plan Total Fractions Prescribed: 14
Plan Total Fractions Prescribed: 28
Plan Total Prescribed Dose: 25.2 Gy
Plan Total Prescribed Dose: 50.4 Gy
Reference Point Dosage Given to Date: 10.8 Gy
Reference Point Dosage Given to Date: 19.8 Gy
Reference Point Session Dosage Given: 1.8 Gy
Reference Point Session Dosage Given: 1.8 Gy
Session Number: 11

## 2023-08-11 ENCOUNTER — Ambulatory Visit: Payer: No Typology Code available for payment source

## 2023-08-11 ENCOUNTER — Ambulatory Visit
Admission: RE | Admit: 2023-08-11 | Discharge: 2023-08-11 | Disposition: A | Discharge status: Home / Self Care | Payer: No Typology Code available for payment source | Source: Ambulatory Visit | Attending: Radiation Oncology | Admitting: Radiation Oncology

## 2023-08-11 ENCOUNTER — Other Ambulatory Visit: Payer: Self-pay

## 2023-08-11 DIAGNOSIS — Z21 Asymptomatic human immunodeficiency virus [HIV] infection status: Secondary | ICD-10-CM | POA: Diagnosis not present

## 2023-08-11 LAB — RAD ONC ARIA SESSION SUMMARY
Course Elapsed Days: 19
Plan Fractions Treated to Date: 12
Plan Fractions Treated to Date: 6
Plan Prescribed Dose Per Fraction: 1.8 Gy
Plan Prescribed Dose Per Fraction: 1.8 Gy
Plan Total Fractions Prescribed: 14
Plan Total Fractions Prescribed: 28
Plan Total Prescribed Dose: 25.2 Gy
Plan Total Prescribed Dose: 50.4 Gy
Reference Point Dosage Given to Date: 10.8 Gy
Reference Point Dosage Given to Date: 21.6 Gy
Reference Point Session Dosage Given: 1.8 Gy
Reference Point Session Dosage Given: 1.8 Gy
Session Number: 12

## 2023-08-12 ENCOUNTER — Ambulatory Visit: Payer: No Typology Code available for payment source

## 2023-08-12 ENCOUNTER — Ambulatory Visit
Admission: RE | Admit: 2023-08-12 | Discharge: 2023-08-12 | Disposition: A | Discharge status: Home / Self Care | Payer: No Typology Code available for payment source | Source: Ambulatory Visit | Attending: Radiation Oncology | Admitting: Radiation Oncology

## 2023-08-12 ENCOUNTER — Other Ambulatory Visit: Payer: Self-pay

## 2023-08-12 DIAGNOSIS — Z21 Asymptomatic human immunodeficiency virus [HIV] infection status: Secondary | ICD-10-CM | POA: Diagnosis not present

## 2023-08-12 LAB — RAD ONC ARIA SESSION SUMMARY
Course Elapsed Days: 20
Plan Fractions Treated to Date: 13
Plan Fractions Treated to Date: 7
Plan Prescribed Dose Per Fraction: 1.8 Gy
Plan Prescribed Dose Per Fraction: 1.8 Gy
Plan Total Fractions Prescribed: 14
Plan Total Fractions Prescribed: 28
Plan Total Prescribed Dose: 25.2 Gy
Plan Total Prescribed Dose: 50.4 Gy
Reference Point Dosage Given to Date: 12.6 Gy
Reference Point Dosage Given to Date: 23.4 Gy
Reference Point Session Dosage Given: 1.8 Gy
Reference Point Session Dosage Given: 1.8 Gy
Session Number: 13

## 2023-08-13 ENCOUNTER — Ambulatory Visit
Admission: RE | Admit: 2023-08-13 | Discharge: 2023-08-13 | Disposition: A | Discharge status: Home / Self Care | Payer: No Typology Code available for payment source | Source: Ambulatory Visit | Attending: Radiation Oncology | Admitting: Radiation Oncology

## 2023-08-13 ENCOUNTER — Other Ambulatory Visit: Payer: Self-pay

## 2023-08-13 DIAGNOSIS — Z21 Asymptomatic human immunodeficiency virus [HIV] infection status: Secondary | ICD-10-CM | POA: Diagnosis not present

## 2023-08-13 LAB — RAD ONC ARIA SESSION SUMMARY
Course Elapsed Days: 21
Plan Fractions Treated to Date: 14
Plan Fractions Treated to Date: 7
Plan Prescribed Dose Per Fraction: 1.8 Gy
Plan Prescribed Dose Per Fraction: 1.8 Gy
Plan Total Fractions Prescribed: 14
Plan Total Fractions Prescribed: 28
Plan Total Prescribed Dose: 25.2 Gy
Plan Total Prescribed Dose: 50.4 Gy
Reference Point Dosage Given to Date: 12.6 Gy
Reference Point Dosage Given to Date: 25.2 Gy
Reference Point Session Dosage Given: 1.8 Gy
Reference Point Session Dosage Given: 1.8 Gy
Session Number: 14

## 2023-08-14 ENCOUNTER — Other Ambulatory Visit: Payer: Self-pay

## 2023-08-14 ENCOUNTER — Ambulatory Visit
Admission: RE | Admit: 2023-08-14 | Discharge: 2023-08-14 | Disposition: A | Discharge status: Home / Self Care | Payer: No Typology Code available for payment source | Source: Ambulatory Visit | Attending: Radiation Oncology | Admitting: Radiation Oncology

## 2023-08-14 DIAGNOSIS — Z21 Asymptomatic human immunodeficiency virus [HIV] infection status: Secondary | ICD-10-CM | POA: Diagnosis not present

## 2023-08-14 LAB — RAD ONC ARIA SESSION SUMMARY
Course Elapsed Days: 22
Plan Fractions Treated to Date: 15
Plan Fractions Treated to Date: 8
Plan Prescribed Dose Per Fraction: 1.8 Gy
Plan Prescribed Dose Per Fraction: 1.8 Gy
Plan Total Fractions Prescribed: 14
Plan Total Fractions Prescribed: 28
Plan Total Prescribed Dose: 25.2 Gy
Plan Total Prescribed Dose: 50.4 Gy
Reference Point Dosage Given to Date: 14.4 Gy
Reference Point Dosage Given to Date: 27 Gy
Reference Point Session Dosage Given: 1.8 Gy
Reference Point Session Dosage Given: 1.8 Gy
Session Number: 15

## 2023-08-17 ENCOUNTER — Other Ambulatory Visit: Payer: Self-pay

## 2023-08-17 ENCOUNTER — Ambulatory Visit
Admission: RE | Admit: 2023-08-17 | Discharge: 2023-08-17 | Disposition: A | Discharge status: Home / Self Care | Payer: No Typology Code available for payment source | Source: Ambulatory Visit | Attending: Radiation Oncology | Admitting: Radiation Oncology

## 2023-08-17 DIAGNOSIS — Z21 Asymptomatic human immunodeficiency virus [HIV] infection status: Secondary | ICD-10-CM | POA: Diagnosis not present

## 2023-08-17 LAB — RAD ONC ARIA SESSION SUMMARY
Course Elapsed Days: 25
Plan Fractions Treated to Date: 16
Plan Fractions Treated to Date: 8
Plan Prescribed Dose Per Fraction: 1.8 Gy
Plan Prescribed Dose Per Fraction: 1.8 Gy
Plan Total Fractions Prescribed: 14
Plan Total Fractions Prescribed: 28
Plan Total Prescribed Dose: 25.2 Gy
Plan Total Prescribed Dose: 50.4 Gy
Reference Point Dosage Given to Date: 14.4 Gy
Reference Point Dosage Given to Date: 28.8 Gy
Reference Point Session Dosage Given: 1.8 Gy
Reference Point Session Dosage Given: 1.8 Gy
Session Number: 16

## 2023-08-18 ENCOUNTER — Ambulatory Visit
Admission: RE | Admit: 2023-08-18 | Discharge: 2023-08-18 | Disposition: A | Discharge status: Home / Self Care | Payer: No Typology Code available for payment source | Source: Ambulatory Visit | Attending: Radiation Oncology | Admitting: Radiation Oncology

## 2023-08-18 ENCOUNTER — Other Ambulatory Visit: Payer: Self-pay

## 2023-08-18 DIAGNOSIS — Z21 Asymptomatic human immunodeficiency virus [HIV] infection status: Secondary | ICD-10-CM | POA: Diagnosis not present

## 2023-08-18 LAB — RAD ONC ARIA SESSION SUMMARY
Course Elapsed Days: 26
Plan Fractions Treated to Date: 17
Plan Fractions Treated to Date: 9
Plan Prescribed Dose Per Fraction: 1.8 Gy
Plan Prescribed Dose Per Fraction: 1.8 Gy
Plan Total Fractions Prescribed: 14
Plan Total Fractions Prescribed: 28
Plan Total Prescribed Dose: 25.2 Gy
Plan Total Prescribed Dose: 50.4 Gy
Reference Point Dosage Given to Date: 16.2 Gy
Reference Point Dosage Given to Date: 30.6 Gy
Reference Point Session Dosage Given: 1.8 Gy
Reference Point Session Dosage Given: 1.8 Gy
Session Number: 17

## 2023-08-19 ENCOUNTER — Other Ambulatory Visit: Payer: Self-pay

## 2023-08-19 ENCOUNTER — Ambulatory Visit
Admission: RE | Admit: 2023-08-19 | Discharge: 2023-08-19 | Disposition: A | Discharge status: Home / Self Care | Payer: No Typology Code available for payment source | Source: Ambulatory Visit | Attending: Radiation Oncology | Admitting: Radiation Oncology

## 2023-08-19 DIAGNOSIS — Z21 Asymptomatic human immunodeficiency virus [HIV] infection status: Secondary | ICD-10-CM | POA: Diagnosis not present

## 2023-08-19 LAB — RAD ONC ARIA SESSION SUMMARY
Course Elapsed Days: 27
Plan Fractions Treated to Date: 18
Plan Fractions Treated to Date: 9
Plan Prescribed Dose Per Fraction: 1.8 Gy
Plan Prescribed Dose Per Fraction: 1.8 Gy
Plan Total Fractions Prescribed: 14
Plan Total Fractions Prescribed: 28
Plan Total Prescribed Dose: 25.2 Gy
Plan Total Prescribed Dose: 50.4 Gy
Reference Point Dosage Given to Date: 16.2 Gy
Reference Point Dosage Given to Date: 32.4 Gy
Reference Point Session Dosage Given: 1.8 Gy
Reference Point Session Dosage Given: 1.8 Gy
Session Number: 18

## 2023-08-20 ENCOUNTER — Telehealth: Payer: Self-pay

## 2023-08-20 ENCOUNTER — Other Ambulatory Visit: Payer: Self-pay

## 2023-08-20 ENCOUNTER — Ambulatory Visit
Admission: RE | Admit: 2023-08-20 | Discharge: 2023-08-20 | Disposition: A | Discharge status: Home / Self Care | Payer: No Typology Code available for payment source | Source: Ambulatory Visit | Attending: Radiation Oncology | Admitting: Radiation Oncology

## 2023-08-20 DIAGNOSIS — Z21 Asymptomatic human immunodeficiency virus [HIV] infection status: Secondary | ICD-10-CM | POA: Diagnosis not present

## 2023-08-20 LAB — RAD ONC ARIA SESSION SUMMARY
Course Elapsed Days: 28
Plan Fractions Treated to Date: 10
Plan Fractions Treated to Date: 19
Plan Prescribed Dose Per Fraction: 1.8 Gy
Plan Prescribed Dose Per Fraction: 1.8 Gy
Plan Total Fractions Prescribed: 14
Plan Total Fractions Prescribed: 28
Plan Total Prescribed Dose: 25.2 Gy
Plan Total Prescribed Dose: 50.4 Gy
Reference Point Dosage Given to Date: 18 Gy
Reference Point Dosage Given to Date: 34.2 Gy
Reference Point Session Dosage Given: 1.8 Gy
Reference Point Session Dosage Given: 1.8 Gy
Session Number: 19

## 2023-08-20 NOTE — Telephone Encounter (Signed)
Notified Patient by voicemail of completion of Disability Attending Physician Statement. Fax transmission confirmation received. Copy of form emailed to Patient as requested. No other needs or concerns noted at this time.

## 2023-08-21 ENCOUNTER — Other Ambulatory Visit: Payer: Self-pay

## 2023-08-21 ENCOUNTER — Ambulatory Visit
Admission: RE | Admit: 2023-08-21 | Discharge: 2023-08-21 | Disposition: A | Discharge status: Home / Self Care | Payer: No Typology Code available for payment source | Source: Ambulatory Visit | Attending: Radiation Oncology | Admitting: Radiation Oncology

## 2023-08-21 DIAGNOSIS — Z21 Asymptomatic human immunodeficiency virus [HIV] infection status: Secondary | ICD-10-CM | POA: Diagnosis not present

## 2023-08-21 LAB — RAD ONC ARIA SESSION SUMMARY
Course Elapsed Days: 29
Plan Fractions Treated to Date: 10
Plan Fractions Treated to Date: 20
Plan Prescribed Dose Per Fraction: 1.8 Gy
Plan Prescribed Dose Per Fraction: 1.8 Gy
Plan Total Fractions Prescribed: 14
Plan Total Fractions Prescribed: 28
Plan Total Prescribed Dose: 25.2 Gy
Plan Total Prescribed Dose: 50.4 Gy
Reference Point Dosage Given to Date: 18 Gy
Reference Point Dosage Given to Date: 36 Gy
Reference Point Session Dosage Given: 1.8 Gy
Reference Point Session Dosage Given: 1.8 Gy
Session Number: 20

## 2023-08-23 ENCOUNTER — Encounter: Payer: Self-pay | Admitting: Family

## 2023-08-23 ENCOUNTER — Encounter: Payer: Self-pay | Admitting: Hematology & Oncology

## 2023-08-24 ENCOUNTER — Ambulatory Visit
Admission: RE | Admit: 2023-08-24 | Discharge: 2023-08-24 | Disposition: A | Discharge status: Home / Self Care | Payer: No Typology Code available for payment source | Source: Ambulatory Visit | Attending: Radiation Oncology | Admitting: Radiation Oncology

## 2023-08-24 ENCOUNTER — Other Ambulatory Visit: Payer: Self-pay

## 2023-08-24 DIAGNOSIS — Z21 Asymptomatic human immunodeficiency virus [HIV] infection status: Secondary | ICD-10-CM | POA: Diagnosis not present

## 2023-08-24 LAB — RAD ONC ARIA SESSION SUMMARY
Course Elapsed Days: 32
Plan Fractions Treated to Date: 11
Plan Fractions Treated to Date: 21
Plan Prescribed Dose Per Fraction: 1.8 Gy
Plan Prescribed Dose Per Fraction: 1.8 Gy
Plan Total Fractions Prescribed: 14
Plan Total Fractions Prescribed: 28
Plan Total Prescribed Dose: 25.2 Gy
Plan Total Prescribed Dose: 50.4 Gy
Reference Point Dosage Given to Date: 19.8 Gy
Reference Point Dosage Given to Date: 37.8 Gy
Reference Point Session Dosage Given: 1.8 Gy
Reference Point Session Dosage Given: 1.8 Gy
Session Number: 21

## 2023-08-25 ENCOUNTER — Other Ambulatory Visit: Payer: Self-pay

## 2023-08-25 ENCOUNTER — Ambulatory Visit
Admission: RE | Admit: 2023-08-25 | Discharge: 2023-08-25 | Disposition: A | Discharge status: Home / Self Care | Payer: No Typology Code available for payment source | Source: Ambulatory Visit | Attending: Radiation Oncology | Admitting: Radiation Oncology

## 2023-08-25 DIAGNOSIS — Z21 Asymptomatic human immunodeficiency virus [HIV] infection status: Secondary | ICD-10-CM | POA: Diagnosis not present

## 2023-08-25 LAB — RAD ONC ARIA SESSION SUMMARY
Course Elapsed Days: 33
Plan Fractions Treated to Date: 11
Plan Fractions Treated to Date: 22
Plan Prescribed Dose Per Fraction: 1.8 Gy
Plan Prescribed Dose Per Fraction: 1.8 Gy
Plan Total Fractions Prescribed: 14
Plan Total Fractions Prescribed: 28
Plan Total Prescribed Dose: 25.2 Gy
Plan Total Prescribed Dose: 50.4 Gy
Reference Point Dosage Given to Date: 19.8 Gy
Reference Point Dosage Given to Date: 39.6 Gy
Reference Point Session Dosage Given: 1.8 Gy
Reference Point Session Dosage Given: 1.8 Gy
Session Number: 22

## 2023-08-27 ENCOUNTER — Other Ambulatory Visit: Payer: Self-pay

## 2023-08-27 ENCOUNTER — Ambulatory Visit
Admission: RE | Admit: 2023-08-27 | Discharge: 2023-08-27 | Disposition: A | Discharge status: Home / Self Care | Payer: No Typology Code available for payment source | Source: Ambulatory Visit | Attending: Radiation Oncology | Admitting: Radiation Oncology

## 2023-08-27 DIAGNOSIS — Z21 Asymptomatic human immunodeficiency virus [HIV] infection status: Secondary | ICD-10-CM | POA: Diagnosis not present

## 2023-08-27 LAB — RAD ONC ARIA SESSION SUMMARY
Course Elapsed Days: 35
Plan Fractions Treated to Date: 12
Plan Fractions Treated to Date: 23
Plan Prescribed Dose Per Fraction: 1.8 Gy
Plan Prescribed Dose Per Fraction: 1.8 Gy
Plan Total Fractions Prescribed: 14
Plan Total Fractions Prescribed: 28
Plan Total Prescribed Dose: 25.2 Gy
Plan Total Prescribed Dose: 50.4 Gy
Reference Point Dosage Given to Date: 21.6 Gy
Reference Point Dosage Given to Date: 41.4 Gy
Reference Point Session Dosage Given: 1.8 Gy
Reference Point Session Dosage Given: 1.8 Gy
Session Number: 23

## 2023-08-28 ENCOUNTER — Ambulatory Visit
Admission: RE | Admit: 2023-08-28 | Discharge: 2023-08-28 | Disposition: A | Discharge status: Home / Self Care | Payer: No Typology Code available for payment source | Source: Ambulatory Visit | Attending: Radiation Oncology | Admitting: Radiation Oncology

## 2023-08-28 ENCOUNTER — Other Ambulatory Visit: Payer: Self-pay

## 2023-08-28 DIAGNOSIS — Z21 Asymptomatic human immunodeficiency virus [HIV] infection status: Secondary | ICD-10-CM | POA: Diagnosis not present

## 2023-08-28 LAB — RAD ONC ARIA SESSION SUMMARY
Course Elapsed Days: 36
Plan Fractions Treated to Date: 12
Plan Fractions Treated to Date: 24
Plan Prescribed Dose Per Fraction: 1.8 Gy
Plan Prescribed Dose Per Fraction: 1.8 Gy
Plan Total Fractions Prescribed: 14
Plan Total Fractions Prescribed: 28
Plan Total Prescribed Dose: 25.2 Gy
Plan Total Prescribed Dose: 50.4 Gy
Reference Point Dosage Given to Date: 21.6 Gy
Reference Point Dosage Given to Date: 43.2 Gy
Reference Point Session Dosage Given: 1.8 Gy
Reference Point Session Dosage Given: 1.8 Gy
Session Number: 24

## 2023-08-31 ENCOUNTER — Other Ambulatory Visit: Payer: Self-pay

## 2023-08-31 ENCOUNTER — Encounter: Payer: Self-pay | Admitting: *Deleted

## 2023-08-31 ENCOUNTER — Inpatient Hospital Stay: Payer: No Typology Code available for payment source | Attending: Hematology & Oncology

## 2023-08-31 ENCOUNTER — Ambulatory Visit
Admission: RE | Admit: 2023-08-31 | Discharge: 2023-08-31 | Disposition: A | Discharge status: Home / Self Care | Payer: No Typology Code available for payment source | Source: Ambulatory Visit | Attending: Radiation Oncology | Admitting: Radiation Oncology

## 2023-08-31 ENCOUNTER — Inpatient Hospital Stay (HOSPITAL_BASED_OUTPATIENT_CLINIC_OR_DEPARTMENT_OTHER): Payer: No Typology Code available for payment source | Admitting: Hematology & Oncology

## 2023-08-31 ENCOUNTER — Inpatient Hospital Stay: Payer: No Typology Code available for payment source

## 2023-08-31 ENCOUNTER — Encounter: Payer: Self-pay | Admitting: Hematology & Oncology

## 2023-08-31 VITALS — BP 135/80 | HR 125 | Temp 97.8°F | Resp 18 | Ht 65.0 in | Wt 157.0 lb

## 2023-08-31 DIAGNOSIS — Z79899 Other long term (current) drug therapy: Secondary | ICD-10-CM | POA: Insufficient documentation

## 2023-08-31 DIAGNOSIS — Z21 Asymptomatic human immunodeficiency virus [HIV] infection status: Secondary | ICD-10-CM | POA: Insufficient documentation

## 2023-08-31 DIAGNOSIS — C50912 Malignant neoplasm of unspecified site of left female breast: Secondary | ICD-10-CM | POA: Insufficient documentation

## 2023-08-31 DIAGNOSIS — C773 Secondary and unspecified malignant neoplasm of axilla and upper limb lymph nodes: Secondary | ICD-10-CM | POA: Diagnosis not present

## 2023-08-31 DIAGNOSIS — Z510 Encounter for antineoplastic radiation therapy: Secondary | ICD-10-CM | POA: Insufficient documentation

## 2023-08-31 DIAGNOSIS — Z171 Estrogen receptor negative status [ER-]: Secondary | ICD-10-CM | POA: Insufficient documentation

## 2023-08-31 LAB — RAD ONC ARIA SESSION SUMMARY
Course Elapsed Days: 39
Plan Fractions Treated to Date: 13
Plan Fractions Treated to Date: 25
Plan Prescribed Dose Per Fraction: 1.8 Gy
Plan Prescribed Dose Per Fraction: 1.8 Gy
Plan Total Fractions Prescribed: 14
Plan Total Fractions Prescribed: 28
Plan Total Prescribed Dose: 25.2 Gy
Plan Total Prescribed Dose: 50.4 Gy
Reference Point Dosage Given to Date: 23.4 Gy
Reference Point Dosage Given to Date: 45 Gy
Reference Point Session Dosage Given: 1.8 Gy
Reference Point Session Dosage Given: 1.8 Gy
Session Number: 25

## 2023-08-31 LAB — CMP (CANCER CENTER ONLY)
ALT: 9 U/L (ref 0–44)
AST: 14 U/L — ABNORMAL LOW (ref 15–41)
Albumin: 3.6 g/dL (ref 3.5–5.0)
Alkaline Phosphatase: 136 U/L — ABNORMAL HIGH (ref 38–126)
Anion gap: 10 (ref 5–15)
BUN: 5 mg/dL — ABNORMAL LOW (ref 6–20)
CO2: 23 mmol/L (ref 22–32)
Calcium: 8.7 mg/dL — ABNORMAL LOW (ref 8.9–10.3)
Chloride: 106 mmol/L (ref 98–111)
Creatinine: 0.75 mg/dL (ref 0.44–1.00)
GFR, Estimated: >60 mL/min (ref >60)
Glucose, Bld: 132 mg/dL — ABNORMAL HIGH (ref 70–99)
Potassium: 3.1 mmol/L — ABNORMAL LOW (ref 3.5–5.1)
Sodium: 139 mmol/L (ref 135–145)
Total Bilirubin: 0.3 mg/dL (ref 0.0–1.2)
Total Protein: 6.6 g/dL (ref 6.5–8.1)

## 2023-08-31 LAB — CBC WITH DIFFERENTIAL (CANCER CENTER ONLY)
Abs Immature Granulocytes: 0.04 10*3/uL (ref 0.00–0.07)
Basophils Absolute: 0 10*3/uL (ref 0.0–0.1)
Basophils Relative: 0 %
Eosinophils Absolute: 0.5 10*3/uL (ref 0.0–0.5)
Eosinophils Relative: 6 %
HCT: 34.3 % — ABNORMAL LOW (ref 36.0–46.0)
Hemoglobin: 11.3 g/dL — ABNORMAL LOW (ref 12.0–15.0)
Immature Granulocytes: 1 %
Lymphocytes Relative: 16 %
Lymphs Abs: 1.2 10*3/uL (ref 0.7–4.0)
MCH: 27.4 pg (ref 26.0–34.0)
MCHC: 32.9 g/dL (ref 30.0–36.0)
MCV: 83.1 fL (ref 80.0–100.0)
Monocytes Absolute: 0.6 10*3/uL (ref 0.1–1.0)
Monocytes Relative: 7 %
Neutro Abs: 5.3 10*3/uL (ref 1.7–7.7)
Neutrophils Relative %: 70 %
Platelet Count: 225 10*3/uL (ref 150–400)
RBC: 4.13 MIL/uL (ref 3.87–5.11)
RDW: 13.8 % (ref 11.5–15.5)
WBC Count: 7.6 10*3/uL (ref 4.0–10.5)
nRBC: 0 % (ref 0.0–0.2)

## 2023-08-31 MED ORDER — SODIUM CHLORIDE 0.9% FLUSH
10.0000 mL | INTRAVENOUS | Status: DC | PRN
Start: 2023-08-31 — End: 2023-08-31
  Administered 2023-08-31: 10 mL via INTRAVENOUS

## 2023-08-31 MED ORDER — HEPARIN SOD (PORK) LOCK FLUSH 100 UNIT/ML IV SOLN
500.0000 [IU] | Freq: Once | INTRAVENOUS | Status: AC
  Administered 2023-08-31: 500 [IU] via INTRAVENOUS

## 2023-08-31 NOTE — Patient Instructions (Signed)

## 2023-08-31 NOTE — Progress Notes (Signed)
Is here Hematology and Oncology Follow Up Visit  Briana [name] [id number] 03-02-1976 47 y.o. 08/31/2023   Principle Diagnosis:  Stage IIIC (T4N3M0) TRIPLE NEGATIVE infiltrating ductal carcinoma of the left breast HIV (+)  Current Therapy:   Neoadjuvant  chemoimmunotherapy --- carboplatin/Taxol/pembrolizumab ---s/p cycle #3 - start on 02/19/2023 S/p LEFT MRM - 06/08/2023 --  Residual breast cancer - 3/3 nodes (-) XRT -- Start on 07/22/2023 Xeloda - start AFTER XRT     Interim History:  Briana [name] is back for follow-up.  Briana [name] is getting radiation therapy right now.  I think Briana [name] will finish up on January 10.  Briana [name]  does have some radiation dermatitis overall in the left breast area.  Her eczema is also flaring up.  Briana [name] is going need to see her dermatologist.  Otherwise, Briana [name] is doing okay.  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  Briana [name] was quite busy over the Christmas holiday with the Christmas light display of that [place] had.  Her appetite is doing okay.  Briana [name] had a good Christmas dinner.  It sounds like Briana [name] will have a quad New Years.  Has been no problem with fever.  Briana [name] has had no change in bowel or bladder habits.  Briana [name] has had no cough or shortness of breath.  Has been no dysphagia or odynophagia.  Overall, I would say that her performance status is probably ECOG 1.    Medications:  Current Outpatient Medications:    amLODipine (NORVASC) 10 MG tablet, Take 1 tablet (10 mg total) by mouth daily., Disp: 90 tablet, Rfl: 1   bictegravir-emtricitabine-tenofovir AF (BIKTARVY) 50-200-25 MG TABS tablet, TAKE 1 TABLET DAILY, Disp: 30 tablet, Rfl: 11   ferrous sulfate 325 (65 FE) MG EC tablet, Take 325 mg by mouth daily with breakfast., Disp: , Rfl:    hydrochlorothiazide (HYDRODIURIL) 12.5 MG tablet, Take 1 tablet (12.5 mg total) by mouth daily., Disp: 90 tablet, Rfl: 3   HYDROcodone-acetaminophen (NORCO) 5-325 MG tablet, Take 1 tablet by mouth every 4 (four)  hours as needed for severe pain (pain score 7-10)., Disp: 10 tablet, Rfl: 0   potassium chloride SA (KLOR-CON M) 20 MEQ tablet, Take 2 tablets (40 mEq total) by mouth 2 (two) times daily., Disp: 60 tablet, Rfl: 0   triamcinolone (KENALOG) 0.025 % ointment, APPLY TOPICALLY TWICE DAILY AS NEEDED, Disp: 454 g, Rfl: 1   valACYclovir (VALTREX) 1000 MG tablet, Take 1 tablet (1,000 mg total) by mouth 3 (three) times daily., Disp: 20 tablet, Rfl: 0  Allergies:  Allergies  Allergen Reactions   Pumpkin Flavoring Agent (Non-Screening) Swelling    ONLY Lip swelling after eating pumpkin pie    Past Medical History, Surgical history, Social history, and Family History were reviewed and updated.  Review of Systems: Review of Systems  Constitutional: Negative.   HENT:  Negative.    Eyes: Negative.   Respiratory: Negative.    Cardiovascular: Negative.   Gastrointestinal: Negative.   Endocrine: Negative.   Genitourinary: Negative.    Musculoskeletal: Negative.   Skin: Negative.   Neurological: Negative.   Hematological: Negative.   Psychiatric/Behavioral: Negative.      Physical Exam: Temperature is 97.8.  Pulse 108.  Blood pressure 135/80.  Weight is 157 pounds.  Wt Readings from Last 3 Encounters:  07/20/23 160 lb (72.6 kg)  07/10/23 162 lb (73.5 kg)  07/09/23 161 lb (73 kg)    Physical Exam Vitals reviewed.  Constitutional:      Comments:  Right breast shows no masses edema or erythema.  There is no right axillary adenopathy.  Left breast really does not show any masses.  Briana [name] has a lot of radiation dermatitis on the left breast area.  There is some slight skin breakdown.  There is no exudate.  I cannot palpate any distinct mass in the left breast.   I cannot palpate any left axillary lymph nodes.    HENT:     Head: Normocephalic and atraumatic.  Eyes:     Pupils: Pupils are equal, round, and reactive to light.  Cardiovascular:     Rate and Rhythm: Normal rate and regular rhythm.      Heart sounds: Normal heart sounds.  Pulmonary:     Effort: Pulmonary effort is normal.     Breath sounds: Normal breath sounds.  Abdominal:     General: Bowel sounds are normal.     Palpations: Abdomen is soft.  Musculoskeletal:        General: No tenderness or deformity. Normal range of motion.     Cervical back: Normal range of motion.  Lymphadenopathy:     Cervical: No cervical adenopathy.  Skin:    General: Skin is warm and dry.     Findings: No erythema or rash.  Neurological:     Mental Status: Briana [name] is alert and oriented to person, place, and time.  Psychiatric:        Behavior: Behavior normal.        Thought Content: Thought content normal.        Judgment: Judgment normal.      Lab Results  Component Value Date   WBC 7.6 08/31/2023   HGB 11.3 (L) 08/31/2023   HCT 34.3 (L) 08/31/2023   MCV 83.1 08/31/2023   PLT 225 08/31/2023     Chemistry      Component Value Date/Time   NA 133 (L) 08/02/2023 0813   K 2.6 (LL) 08/02/2023 0813   CL 97 (L) 08/02/2023 0813   CO2 25 07/20/2023 1030   BUN 17 08/02/2023 0813   CREATININE 1.00 08/02/2023 0813   CREATININE 0.75 07/20/2023 1030   CREATININE 0.62 10/17/2022 0947      Component Value Date/Time   CALCIUM 9.6 07/20/2023 1030   ALKPHOS 134 (H) 08/02/2023 0805   AST 38 08/02/2023 0805   AST 16 07/20/2023 1030   ALT 21 08/02/2023 0805   ALT 9 07/20/2023 1030   BILITOT 0.8 08/02/2023 0805   BILITOT 0.3 07/20/2023 1030     Impression and Plan: Briana [name] is a very nice 47 year old premenopausal Afro-American female with TRIPLE NEGATIVE breast cancer.  Briana [name] has aggressive disease in my opinion.  Briana [name] completed all of her had neoadjuvant chemotherapy.  Briana [name] did very well with the chemotherapy/immunotherapy.  In my opinion, I think Briana [name] really had a good response.  Briana [name] is doing radiotherapy right now.  Briana [name] is doing okay with this.  Briana [name] has the skin breakdown which is not all that surprising.  I still think Briana [name] would be a  good candidate for Xeloda.  We we will think about getting this started after her radiotherapy is completed.  I would like to see her back probably in about 1 month.  At that point in time, I will then talk to her about Xeloda.  I know Briana [name] is going to have some reconstruction with respect to the left breast.  I am sure that we can work with the Engineer, petroleum.   Rose Phi  Myna Hidalgo, MD 12/30/20242:01 PM

## 2023-09-01 ENCOUNTER — Ambulatory Visit: Payer: No Typology Code available for payment source

## 2023-09-01 ENCOUNTER — Encounter: Payer: Self-pay | Admitting: Family

## 2023-09-01 ENCOUNTER — Encounter: Payer: Self-pay | Admitting: Hematology & Oncology

## 2023-09-01 ENCOUNTER — Ambulatory Visit: Payer: No Typology Code available for payment source | Admitting: Radiation Oncology

## 2023-09-01 LAB — LUTEINIZING HORMONE: LH: 8.8 m[IU]/mL

## 2023-09-01 LAB — FOLLICLE STIMULATING HORMONE: FSH: 6.9 m[IU]/mL

## 2023-09-01 LAB — CANCER ANTIGEN 27.29: CA 27.29: <9 U/mL (ref 0.0–38.6)

## 2023-09-02 LAB — ESTRADIOL, ULTRA SENS: Estradiol, Sensitive: 157.1 pg/mL

## 2023-09-03 ENCOUNTER — Ambulatory Visit: Payer: No Typology Code available for payment source | Admitting: Radiation Oncology

## 2023-09-03 ENCOUNTER — Ambulatory Visit
Admission: RE | Admit: 2023-09-03 | Discharge: 2023-09-03 | Disposition: A | Discharge status: Home / Self Care | Payer: No Typology Code available for payment source | Source: Ambulatory Visit | Attending: Radiation Oncology | Admitting: Radiation Oncology

## 2023-09-03 ENCOUNTER — Other Ambulatory Visit: Payer: Self-pay

## 2023-09-03 DIAGNOSIS — Z510 Encounter for antineoplastic radiation therapy: Secondary | ICD-10-CM | POA: Insufficient documentation

## 2023-09-03 DIAGNOSIS — Z171 Estrogen receptor negative status [ER-]: Secondary | ICD-10-CM | POA: Diagnosis present

## 2023-09-03 DIAGNOSIS — C50912 Malignant neoplasm of unspecified site of left female breast: Secondary | ICD-10-CM | POA: Diagnosis present

## 2023-09-03 LAB — RAD ONC ARIA SESSION SUMMARY
Course Elapsed Days: 42
Plan Fractions Treated to Date: 13
Plan Fractions Treated to Date: 26
Plan Prescribed Dose Per Fraction: 1.8 Gy
Plan Prescribed Dose Per Fraction: 1.8 Gy
Plan Total Fractions Prescribed: 14
Plan Total Fractions Prescribed: 28
Plan Total Prescribed Dose: 25.2 Gy
Plan Total Prescribed Dose: 50.4 Gy
Reference Point Dosage Given to Date: 23.4 Gy
Reference Point Dosage Given to Date: 46.8 Gy
Reference Point Session Dosage Given: 1.8 Gy
Reference Point Session Dosage Given: 1.8 Gy
Session Number: 26

## 2023-09-04 ENCOUNTER — Other Ambulatory Visit: Payer: Self-pay

## 2023-09-04 ENCOUNTER — Ambulatory Visit: Payer: No Typology Code available for payment source

## 2023-09-04 ENCOUNTER — Ambulatory Visit
Admission: RE | Admit: 2023-09-04 | Discharge: 2023-09-04 | Disposition: A | Discharge status: Home / Self Care | Payer: No Typology Code available for payment source | Source: Ambulatory Visit | Attending: Radiation Oncology | Admitting: Radiation Oncology

## 2023-09-04 DIAGNOSIS — Z510 Encounter for antineoplastic radiation therapy: Secondary | ICD-10-CM | POA: Diagnosis not present

## 2023-09-04 LAB — RAD ONC ARIA SESSION SUMMARY
Course Elapsed Days: 43
Plan Fractions Treated to Date: 14
Plan Fractions Treated to Date: 27
Plan Prescribed Dose Per Fraction: 1.8 Gy
Plan Prescribed Dose Per Fraction: 1.8 Gy
Plan Total Fractions Prescribed: 14
Plan Total Fractions Prescribed: 28
Plan Total Prescribed Dose: 25.2 Gy
Plan Total Prescribed Dose: 50.4 Gy
Reference Point Dosage Given to Date: 25.2 Gy
Reference Point Dosage Given to Date: 48.6 Gy
Reference Point Session Dosage Given: 1.8 Gy
Reference Point Session Dosage Given: 1.8 Gy
Session Number: 27

## 2023-09-07 ENCOUNTER — Other Ambulatory Visit: Payer: Self-pay

## 2023-09-07 ENCOUNTER — Ambulatory Visit
Admission: RE | Admit: 2023-09-07 | Discharge: 2023-09-07 | Disposition: A | Discharge status: Home / Self Care | Payer: No Typology Code available for payment source | Source: Ambulatory Visit | Attending: Radiation Oncology | Admitting: Radiation Oncology

## 2023-09-07 ENCOUNTER — Ambulatory Visit: Payer: No Typology Code available for payment source

## 2023-09-07 DIAGNOSIS — Z510 Encounter for antineoplastic radiation therapy: Secondary | ICD-10-CM | POA: Diagnosis not present

## 2023-09-07 LAB — RAD ONC ARIA SESSION SUMMARY
Course Elapsed Days: 46
Plan Fractions Treated to Date: 14
Plan Fractions Treated to Date: 28
Plan Prescribed Dose Per Fraction: 1.8 Gy
Plan Prescribed Dose Per Fraction: 1.8 Gy
Plan Total Fractions Prescribed: 14
Plan Total Fractions Prescribed: 28
Plan Total Prescribed Dose: 25.2 Gy
Plan Total Prescribed Dose: 50.4 Gy
Reference Point Dosage Given to Date: 25.2 Gy
Reference Point Dosage Given to Date: 50.4 Gy
Reference Point Session Dosage Given: 1.8 Gy
Reference Point Session Dosage Given: 1.8 Gy
Session Number: 28

## 2023-09-08 ENCOUNTER — Ambulatory Visit: Payer: No Typology Code available for payment source

## 2023-09-08 ENCOUNTER — Other Ambulatory Visit: Payer: Self-pay

## 2023-09-08 ENCOUNTER — Ambulatory Visit
Admission: RE | Admit: 2023-09-08 | Discharge: 2023-09-08 | Disposition: A | Discharge status: Home / Self Care | Payer: No Typology Code available for payment source | Source: Ambulatory Visit | Attending: Radiation Oncology | Admitting: Radiation Oncology

## 2023-09-08 DIAGNOSIS — Z510 Encounter for antineoplastic radiation therapy: Secondary | ICD-10-CM | POA: Diagnosis not present

## 2023-09-08 LAB — RAD ONC ARIA SESSION SUMMARY
Course Elapsed Days: 47
Plan Fractions Treated to Date: 1
Plan Prescribed Dose Per Fraction: 2 Gy
Plan Total Fractions Prescribed: 5
Plan Total Prescribed Dose: 10 Gy
Reference Point Dosage Given to Date: 2 Gy
Reference Point Session Dosage Given: 2 Gy
Session Number: 29

## 2023-09-09 ENCOUNTER — Ambulatory Visit: Payer: No Typology Code available for payment source

## 2023-09-09 ENCOUNTER — Other Ambulatory Visit: Payer: Self-pay

## 2023-09-09 ENCOUNTER — Ambulatory Visit
Admission: RE | Admit: 2023-09-09 | Discharge: 2023-09-09 | Disposition: A | Discharge status: Home / Self Care | Payer: No Typology Code available for payment source | Source: Ambulatory Visit | Attending: Radiation Oncology | Admitting: Radiation Oncology

## 2023-09-09 DIAGNOSIS — Z510 Encounter for antineoplastic radiation therapy: Secondary | ICD-10-CM | POA: Diagnosis not present

## 2023-09-09 LAB — RAD ONC ARIA SESSION SUMMARY
Course Elapsed Days: 48
Plan Fractions Treated to Date: 2
Plan Prescribed Dose Per Fraction: 2 Gy
Plan Total Fractions Prescribed: 5
Plan Total Prescribed Dose: 10 Gy
Reference Point Dosage Given to Date: 4 Gy
Reference Point Session Dosage Given: 2 Gy
Session Number: 30

## 2023-09-10 ENCOUNTER — Ambulatory Visit: Payer: No Typology Code available for payment source

## 2023-09-10 ENCOUNTER — Other Ambulatory Visit: Payer: Self-pay

## 2023-09-10 ENCOUNTER — Ambulatory Visit
Admission: RE | Admit: 2023-09-10 | Discharge: 2023-09-10 | Disposition: A | Discharge status: Home / Self Care | Payer: No Typology Code available for payment source | Source: Ambulatory Visit | Attending: Radiation Oncology | Admitting: Radiation Oncology

## 2023-09-10 DIAGNOSIS — Z510 Encounter for antineoplastic radiation therapy: Secondary | ICD-10-CM | POA: Diagnosis not present

## 2023-09-10 LAB — RAD ONC ARIA SESSION SUMMARY
Course Elapsed Days: 49
Plan Fractions Treated to Date: 3
Plan Prescribed Dose Per Fraction: 2 Gy
Plan Total Fractions Prescribed: 5
Plan Total Prescribed Dose: 10 Gy
Reference Point Dosage Given to Date: 6 Gy
Reference Point Session Dosage Given: 2 Gy
Session Number: 31

## 2023-09-11 ENCOUNTER — Ambulatory Visit: Payer: No Typology Code available for payment source

## 2023-09-14 ENCOUNTER — Ambulatory Visit
Admission: RE | Admit: 2023-09-14 | Discharge: 2023-09-14 | Disposition: A | Discharge status: Home / Self Care | Payer: No Typology Code available for payment source | Source: Ambulatory Visit | Attending: Radiation Oncology | Admitting: Radiation Oncology

## 2023-09-14 ENCOUNTER — Ambulatory Visit: Payer: No Typology Code available for payment source

## 2023-09-14 ENCOUNTER — Other Ambulatory Visit: Payer: Self-pay

## 2023-09-14 DIAGNOSIS — Z510 Encounter for antineoplastic radiation therapy: Secondary | ICD-10-CM | POA: Diagnosis not present

## 2023-09-14 LAB — RAD ONC ARIA SESSION SUMMARY
Course Elapsed Days: 53
Plan Fractions Treated to Date: 4
Plan Prescribed Dose Per Fraction: 2 Gy
Plan Total Fractions Prescribed: 5
Plan Total Prescribed Dose: 10 Gy
Reference Point Dosage Given to Date: 8 Gy
Reference Point Session Dosage Given: 2 Gy
Session Number: 32

## 2023-09-15 ENCOUNTER — Other Ambulatory Visit: Payer: Self-pay

## 2023-09-15 ENCOUNTER — Ambulatory Visit: Payer: No Typology Code available for payment source

## 2023-09-15 ENCOUNTER — Ambulatory Visit
Admission: RE | Admit: 2023-09-15 | Discharge: 2023-09-15 | Disposition: A | Discharge status: Home / Self Care | Payer: No Typology Code available for payment source | Source: Ambulatory Visit | Attending: Radiation Oncology | Admitting: Radiation Oncology

## 2023-09-15 DIAGNOSIS — Z510 Encounter for antineoplastic radiation therapy: Secondary | ICD-10-CM | POA: Diagnosis not present

## 2023-09-15 LAB — RAD ONC ARIA SESSION SUMMARY
Course Elapsed Days: 54
Plan Fractions Treated to Date: 5
Plan Prescribed Dose Per Fraction: 2 Gy
Plan Total Fractions Prescribed: 5
Plan Total Prescribed Dose: 10 Gy
Reference Point Dosage Given to Date: 10 Gy
Reference Point Session Dosage Given: 2 Gy
Session Number: 33

## 2023-09-16 NOTE — Radiation Completion Notes (Addendum)
  Radiation Oncology         [phone] ________________________________  Name: Briana [name] MRN: [id number]  Date of Service: 09/15/2023  DOB: 1976-03-17  End of Treatment Note   Diagnosis: Stage IIIC, cT3N3cM0, triple negative invasive ductal carcinoma with lobular features of the left breast.  Intent: Curative     ==========DELIVERED PLANS==========  First Treatment Date: 2023-07-23 Last Treatment Date: 2023-09-15   Plan Name: CW_L_BH Site: Chest Wall, Left Technique: 3D Mode: Photon Dose Per Fraction: 1.8 Gy Prescribed Dose (Delivered / Prescribed): 25.2 Gy / 25.2 Gy Prescribed Fxs (Delivered / Prescribed): 14 / 14   Plan Name: CW_L_BH_BO Site: Chest Wall, Left Technique: 3D Mode: Photon Dose Per Fraction: 1.8 Gy Prescribed Dose (Delivered / Prescribed): 25.2 Gy / 25.2 Gy Prescribed Fxs (Delivered / Prescribed): 14 / 14   Plan Name: CW_L_SCV_BH Site: Chest Wall, Left Technique: 3D Mode: Photon Dose Per Fraction: 1.8 Gy Prescribed Dose (Delivered / Prescribed): 50.4 Gy / 50.4 Gy Prescribed Fxs (Delivered / Prescribed): 28 / 28   Plan Name: CW_L_Bst_BO Site: Chest Wall, Left Technique: Electron Mode: Electron Dose Per Fraction: 2 Gy Prescribed Dose (Delivered / Prescribed): 10 Gy / 10 Gy Prescribed Fxs (Delivered / Prescribed): 5 / 5     ==========ON TREATMENT VISIT DATES========== 2023-07-28, 2023-08-06, 2023-08-14, 2023-08-18, 2023-08-31, 2023-09-08, 2023-09-15   See weekly On Treatment Notes in Epic for details in the Media tab (listed as Progress notes on the On Treatment Visit Dates listed above). The patient tolerated radiation. She developed fatigue and anticipated skin changes in the treatment field eventually leading to dry desquamation at the conclusion of therapy.  The patient will return in one month and  will continue follow up with Dr. Timmy as well.      Donald KYM Husband, PAC

## 2023-09-22 ENCOUNTER — Telehealth: Payer: Self-pay

## 2023-09-22 ENCOUNTER — Other Ambulatory Visit: Payer: Self-pay

## 2023-09-22 ENCOUNTER — Inpatient Hospital Stay: Payer: No Typology Code available for payment source | Attending: Hematology & Oncology

## 2023-09-22 ENCOUNTER — Encounter: Payer: Self-pay | Admitting: Hematology & Oncology

## 2023-09-22 ENCOUNTER — Inpatient Hospital Stay (HOSPITAL_BASED_OUTPATIENT_CLINIC_OR_DEPARTMENT_OTHER): Payer: No Typology Code available for payment source | Admitting: Hematology & Oncology

## 2023-09-22 ENCOUNTER — Other Ambulatory Visit (HOSPITAL_COMMUNITY): Payer: Self-pay

## 2023-09-22 ENCOUNTER — Encounter: Payer: Self-pay | Admitting: Family

## 2023-09-22 ENCOUNTER — Inpatient Hospital Stay: Payer: No Typology Code available for payment source

## 2023-09-22 ENCOUNTER — Telehealth: Payer: Self-pay | Admitting: Pharmacist

## 2023-09-22 ENCOUNTER — Encounter: Payer: Self-pay | Admitting: *Deleted

## 2023-09-22 VITALS — BP 122/75 | HR 98 | Temp 98.3°F | Resp 20 | Ht 65.0 in | Wt 156.0 lb

## 2023-09-22 DIAGNOSIS — D508 Other iron deficiency anemias: Secondary | ICD-10-CM

## 2023-09-22 DIAGNOSIS — L309 Dermatitis, unspecified: Secondary | ICD-10-CM | POA: Insufficient documentation

## 2023-09-22 DIAGNOSIS — C50912 Malignant neoplasm of unspecified site of left female breast: Secondary | ICD-10-CM | POA: Diagnosis present

## 2023-09-22 DIAGNOSIS — D649 Anemia, unspecified: Secondary | ICD-10-CM | POA: Diagnosis not present

## 2023-09-22 DIAGNOSIS — Z21 Asymptomatic human immunodeficiency virus [HIV] infection status: Secondary | ICD-10-CM | POA: Diagnosis present

## 2023-09-22 DIAGNOSIS — C773 Secondary and unspecified malignant neoplasm of axilla and upper limb lymph nodes: Secondary | ICD-10-CM | POA: Diagnosis not present

## 2023-09-22 DIAGNOSIS — Z171 Estrogen receptor negative status [ER-]: Secondary | ICD-10-CM | POA: Insufficient documentation

## 2023-09-22 DIAGNOSIS — Z79899 Other long term (current) drug therapy: Secondary | ICD-10-CM | POA: Diagnosis not present

## 2023-09-22 DIAGNOSIS — M7989 Other specified soft tissue disorders: Secondary | ICD-10-CM | POA: Insufficient documentation

## 2023-09-22 DIAGNOSIS — Z79624 Long term (current) use of inhibitors of nucleotide synthesis: Secondary | ICD-10-CM | POA: Insufficient documentation

## 2023-09-22 DIAGNOSIS — Z17421 Hormone receptor negative with human epidermal growth factor receptor 2 negative status: Secondary | ICD-10-CM | POA: Diagnosis not present

## 2023-09-22 LAB — CBC WITH DIFFERENTIAL (CANCER CENTER ONLY)
Abs Immature Granulocytes: 0.03 10*3/uL (ref 0.00–0.07)
Basophils Absolute: 0 10*3/uL (ref 0.0–0.1)
Basophils Relative: 0 %
Eosinophils Absolute: 1.5 10*3/uL — ABNORMAL HIGH (ref 0.0–0.5)
Eosinophils Relative: 16 %
HCT: 31.1 % — ABNORMAL LOW (ref 36.0–46.0)
Hemoglobin: 10.2 g/dL — ABNORMAL LOW (ref 12.0–15.0)
Immature Granulocytes: 0 %
Lymphocytes Relative: 20 %
Lymphs Abs: 1.8 10*3/uL (ref 0.7–4.0)
MCH: 27.1 pg (ref 26.0–34.0)
MCHC: 32.8 g/dL (ref 30.0–36.0)
MCV: 82.5 fL (ref 80.0–100.0)
Monocytes Absolute: 0.8 10*3/uL (ref 0.1–1.0)
Monocytes Relative: 9 %
Neutro Abs: 5.1 10*3/uL (ref 1.7–7.7)
Neutrophils Relative %: 55 %
Platelet Count: 268 10*3/uL (ref 150–400)
RBC: 3.77 MIL/uL — ABNORMAL LOW (ref 3.87–5.11)
RDW: 14.6 % (ref 11.5–15.5)
WBC Count: 9.3 10*3/uL (ref 4.0–10.5)
nRBC: 0 % (ref 0.0–0.2)

## 2023-09-22 LAB — CMP (CANCER CENTER ONLY)
ALT: 8 U/L (ref 0–44)
AST: 14 U/L — ABNORMAL LOW (ref 15–41)
Albumin: 3.7 g/dL (ref 3.5–5.0)
Alkaline Phosphatase: 131 U/L — ABNORMAL HIGH (ref 38–126)
Anion gap: 9 (ref 5–15)
BUN: 6 mg/dL (ref 6–20)
CO2: 22 mmol/L (ref 22–32)
Calcium: 8.8 mg/dL — ABNORMAL LOW (ref 8.9–10.3)
Chloride: 106 mmol/L (ref 98–111)
Creatinine: 0.66 mg/dL (ref 0.44–1.00)
GFR, Estimated: >60 mL/min (ref >60)
Glucose, Bld: 83 mg/dL (ref 70–99)
Potassium: 3.1 mmol/L — ABNORMAL LOW (ref 3.5–5.1)
Sodium: 137 mmol/L (ref 135–145)
Total Bilirubin: 0.3 mg/dL (ref 0.0–1.2)
Total Protein: 7.5 g/dL (ref 6.5–8.1)

## 2023-09-22 LAB — IRON AND IRON BINDING CAPACITY (CC-WL,HP ONLY)
Iron: 27 ug/dL — ABNORMAL LOW (ref 28–170)
Saturation Ratios: 9 % — ABNORMAL LOW (ref 10.4–31.8)
TIBC: 319 ug/dL (ref 250–450)
UIBC: 292 ug/dL (ref 148–442)

## 2023-09-22 LAB — LACTATE DEHYDROGENASE: LDH: 332 U/L — ABNORMAL HIGH (ref 98–192)

## 2023-09-22 MED ORDER — HEPARIN SOD (PORK) LOCK FLUSH 100 UNIT/ML IV SOLN
500.0000 [IU] | Freq: Once | INTRAVENOUS | Status: AC
  Administered 2023-09-22: 500 [IU] via INTRAVENOUS

## 2023-09-22 MED ORDER — CAPECITABINE 500 MG PO TABS
2000.0000 mg | ORAL_TABLET | Freq: Two times a day (BID) | ORAL | 4 refills | Status: DC
Start: 2023-09-22 — End: 2023-09-22

## 2023-09-22 MED ORDER — SODIUM CHLORIDE 0.9% FLUSH
10.0000 mL | Freq: Once | INTRAVENOUS | Status: AC
  Administered 2023-09-22: 10 mL

## 2023-09-22 MED ORDER — CAPECITABINE 500 MG PO TABS: 2000.0000 mg | ORAL_TABLET | Freq: Two times a day (BID) | ORAL | 4 refills | Status: DC

## 2023-09-22 NOTE — Telephone Encounter (Signed)
Oral Oncology Patient Advocate Encounter  New authorization   Received notification that prior authorization for Capecitabine is required.   PA submitted via rxb.promtpa.com  Key [id number]  Status is pending     Ardeen Fillers, CPhT Oncology Pharmacy Patient Advocate  [facility]  [phone] (phone) [phone] (fax) 09/22/2023 4:01 PM

## 2023-09-22 NOTE — Progress Notes (Signed)
Is here Hematology and Oncology Follow Up Visit  Briana [name] [id number] [birth year] 48 y.o. 09/22/2023   Principle Diagnosis:  Stage IIIC (T4N3M0) TRIPLE NEGATIVE infiltrating ductal carcinoma of the left breast HIV (+)  Current Therapy:   Neoadjuvant  chemoimmunotherapy --- carboplatin/Taxol/pembrolizumab ---s/p cycle #3 - start on 02/19/2023 S/p LEFT MRM - 06/08/2023 --  Residual breast cancer - 3/3 nodes (-) XRT -- Start on 07/22/2023 --completed on 09/15/2023 Xeloda - start cycle #1 on 09/28/2023     Interim History:  [employer]ri[name] is back for follow-up.  She has been swelling in the left leg.  She also has a problem with her eczema.  It seems like that she has is having a flareup of the eczema.  She really needs to see her dermatologist.  I think she has been on Dupixent in the past.  Now that she has completed her radiation, we will get her on Xeloda.  I think Xeloda would certainly be helpful given the fact that she does have triple negative breast cancer.  I think that the  X-ACT trial showed that there is a benefit to Xeloda.  Maybe, the Xeloda will help with her eczema.  Again, she has swelling in the left leg.  I am going to have to see about get a Doppler of that leg to make sure there is no thromboembolic disease.  She is still working.  She works for [employer] over at [name].  She enjoys this.  She is eating okay.  She is having no problems with nausea or vomiting.  She is having no cough or shortness of breath.  She apparently supposed to have surgery for the left breast I think in April or May.  I will have to talk to her plastic surgeon about this.  She has had no change in bowel or bladder habits..  There is been no bleeding.  She has had no fever.  Her HIV has never been a problem.  Overall, I would say that her performance status is probably ECOG 1.     Medications:  Current Outpatient Medications:    DUPIXENT 300 MG/2ML SOAJ, Inject into the  skin., Disp: , Rfl:    amLODipine (NORVASC) 10 MG tablet, Take 1 tablet (10 mg total) by mouth daily., Disp: 90 tablet, Rfl: 1   bictegravir-emtricitabine-tenofovir AF (BIKTARVY) 50-200-25 MG TABS tablet, TAKE 1 TABLET DAILY, Disp: 30 tablet, Rfl: 11   ferrous sulfate 325 (65 FE) MG EC tablet, Take 325 mg by mouth daily with breakfast., Disp: , Rfl:    hydrochlorothiazide (HYDRODIURIL) 12.5 MG tablet, Take 1 tablet (12.5 mg total) by mouth daily., Disp: 90 tablet, Rfl: 3   HYDROcodone-acetaminophen (NORCO) 5-325 MG tablet, Take 1 tablet by mouth every 4 (four) hours as needed for severe pain (pain score 7-10)., Disp: 10 tablet, Rfl: 0   potassium chloride SA (KLOR-CON M) 20 MEQ tablet, Take 2 tablets (40 mEq total) by mouth 2 (two) times daily., Disp: 60 tablet, Rfl: 0   triamcinolone (KENALOG) 0.025 % ointment, APPLY TOPICALLY TWICE DAILY AS NEEDED, Disp: 454 g, Rfl: 1   valACYclovir (VALTREX) 1000 MG tablet, Take 1 tablet (1,000 mg total) by mouth 3 (three) times daily., Disp: 20 tablet, Rfl: 0  Allergies:  Allergies  Allergen Reactions   Pumpkin Flavoring Agent (Non-Screening) Swelling    ONLY Lip swelling after eating pumpkin pie    Past Medical History, Surgical history, Social history, and Family History were reviewed and updated.  Review of Systems: Review of Systems  Constitutional: Negative.   HENT:  Negative.    Eyes: Negative.   Respiratory: Negative.    Cardiovascular: Negative.   Gastrointestinal: Negative.   Endocrine: Negative.   Genitourinary: Negative.    Musculoskeletal: Negative.   Skin: Negative.   Neurological: Negative.   Hematological: Negative.   Psychiatric/Behavioral: Negative.      Physical Exam: Temperature is 98.3.  Pulse 98.  Blood pressure 122/75.  Weight is 156 pounds.    Wt Readings from Last 3 Encounters:  09/22/23 156 lb (70.8 kg)  08/31/23 157 lb (71.2 kg)  07/20/23 160 lb (72.6 kg)    Physical Exam Vitals reviewed.  Constitutional:       Comments: Right breast shows no masses edema or erythema.  There is no right axillary adenopathy.  Left breast really does not show any masses.  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  There is some slight skin breakdown.  There is no exudate.  I cannot palpate any distinct mass in the left breast.   I cannot palpate any left axillary lymph nodes.    HENT:     Head: Normocephalic and atraumatic.  Eyes:     Pupils: Pupils are equal, round, and reactive to light.  Cardiovascular:     Rate and Rhythm: Normal rate and regular rhythm.     Heart sounds: Normal heart sounds.  Pulmonary:     Effort: Pulmonary effort is normal.     Breath sounds: Normal breath sounds.  Abdominal:     General: Bowel sounds are normal.     Palpations: Abdomen is soft.  Musculoskeletal:        General: No tenderness or deformity. Normal range of motion.     Cervical back: Normal range of motion.  Lymphadenopathy:     Cervical: No cervical adenopathy.  Skin:    General: Skin is warm and dry.     Findings: No erythema or rash.  Neurological:     Mental Status: She is alert and oriented to person, place, and time.  Psychiatric:        Behavior: Behavior normal.        Thought Content: Thought content normal.        Judgment: Judgment normal.     Lab Results  Component Value Date   WBC 7.6 08/31/2023   HGB 11.3 (L) 08/31/2023   HCT 34.3 (L) 08/31/2023   MCV 83.1 08/31/2023   PLT 225 08/31/2023     Chemistry      Component Value Date/Time   NA 139 08/31/2023 1325   K 3.1 (L) 08/31/2023 1325   CL 106 08/31/2023 1325   CO2 23 08/31/2023 1325   BUN 5 (L) 08/31/2023 1325   CREATININE 0.75 08/31/2023 1325   CREATININE 0.62 10/17/2022 0947      Component Value Date/Time   CALCIUM 8.7 (L) 08/31/2023 1325   ALKPHOS 136 (H) 08/31/2023 1325   AST 14 (L) 08/31/2023 1325   ALT 9 08/31/2023 1325   BILITOT 0.3 08/31/2023 1325     Impression and Plan: [name] is a very nice  48 year old premenopausal Afro-American female with TRIPLE NEGATIVE breast cancer.  She has aggressive disease in my opinion.  She completed all of her had neoadjuvant chemotherapy.  She did very well with the chemotherapy/immunotherapy.  In my opinion, I think she really had a good response.  She is doing radiotherapy right now.  She is  doing okay with this.  She has the skin breakdown which is not all that surprising.  I think she would be a good candidate for Xeloda.  I will have to send in the prescription to the [facility].  I think that the Xeloda might help with the eczema.  Told her to make sure that she gets over-the-counter Voltaren cream to use twice a day on her hands and feet to help prevent PPE.  I would like to try to get her back to see [place] in about 3 to 4 weeks.  At that point in time, she should be started the Xeloda.  She will have her Doppler of the left leg tomorrow.   She is a little more anemic.  Her MCV is down a little bit.  I will send off some iron studies to see if she is iron deficient.  Josph Macho, MD 1/21/20259:46 AM

## 2023-09-22 NOTE — Patient Instructions (Signed)

## 2023-09-22 NOTE — Progress Notes (Signed)
Patient will now move towards maintenance Xeloda now that she is completed chemo and radiation. Prescription sent to [facility]. That team will work on auth/dispensing.   Oncology Nurse Navigator Documentation     09/22/2023    9:45 AM  Oncology Nurse Navigator Flowsheets  Phase of Treatment Radiation  Radiation Actual End Date: 09/15/2023  Navigator Follow Up Date: 10/21/2023  Navigator Follow Up Reason: Follow-up Appointment  Navigator Location [facility]  Navigator Encounter Type Follow-up Appt  Patient Visit Type MedOnc  Treatment Phase Active Tx  Barriers/Navigation Needs No Barriers At This Time  Interventions None Required  Acuity Level 1-No Barriers  Support Groups/Services Friends and Family  Time Spent with Patient 15

## 2023-09-22 NOTE — Telephone Encounter (Signed)
Oral Oncology Pharmacist Encounter  Received new prescription for Xeloda (capecitabine) for the treatment of stage IIIC HR/PR negative, HER-2 negative breast cancer, planned duration ~8 cycles or until until disease progression or unacceptable drug toxicity.  CBC w/ Diff and CMP from 09/22/23 assessed, no relevant lab abnormalities requiring baseline dose adjustment required at this time. Prescription dose and frequency assessed for appropriateness.  Current medication list in Epic reviewed, no relevant/significant DDIs with Xeloda identified.  Evaluated chart and no patient barriers to medication adherence noted.   Prescription has been e-scribed to the [facility] for benefits analysis and approval.  Oral Oncology Clinic will continue to follow for insurance authorization, copayment issues, initial counseling and start date.  Sherry Ruffing, PharmD, BCPS, BCOP Hematology/Oncology Clinical Pharmacist [facility] Oral Chemotherapy Navigation Clinics [phone] 09/22/2023 2:11 PM

## 2023-09-23 ENCOUNTER — Telehealth: Payer: Self-pay | Admitting: Hematology & Oncology

## 2023-09-23 ENCOUNTER — Ambulatory Visit (HOSPITAL_BASED_OUTPATIENT_CLINIC_OR_DEPARTMENT_OTHER)
Admission: RE | Admit: 2023-09-23 | Discharge: 2023-09-23 | Disposition: A | Discharge status: Home / Self Care | Payer: No Typology Code available for payment source | Source: Ambulatory Visit | Attending: Hematology & Oncology | Admitting: Hematology & Oncology

## 2023-09-23 ENCOUNTER — Other Ambulatory Visit (HOSPITAL_COMMUNITY): Payer: Self-pay

## 2023-09-23 ENCOUNTER — Other Ambulatory Visit: Payer: Self-pay | Admitting: Family

## 2023-09-23 ENCOUNTER — Encounter: Payer: Self-pay | Admitting: *Deleted

## 2023-09-23 DIAGNOSIS — C50912 Malignant neoplasm of unspecified site of left female breast: Secondary | ICD-10-CM | POA: Diagnosis present

## 2023-09-23 DIAGNOSIS — C773 Secondary and unspecified malignant neoplasm of axilla and upper limb lymph nodes: Secondary | ICD-10-CM | POA: Diagnosis present

## 2023-09-23 DIAGNOSIS — M7989 Other specified soft tissue disorders: Secondary | ICD-10-CM | POA: Diagnosis present

## 2023-09-23 NOTE — Telephone Encounter (Signed)
Called pt to schedule IV Iron. LVM for pt to return call.

## 2023-09-23 NOTE — Telephone Encounter (Signed)
Received notification that Prior Authorization for Capecitabine was not required through plan but that patient must fill through [facility].   Script and all supporting documents sent to [facility] for processing and fulfillment.    Ardeen Fillers, CPhT Oncology Pharmacy Patient Advocate  [facility]  [phone] (phone) [phone] (fax) 09/23/2023 10:46 AM

## 2023-09-25 NOTE — Telephone Encounter (Signed)
Called to [facility] to check the status of Capecitabine. Informed by representative that Capecitabine was scheduled to be shipped on Monday, 09/28/23, with an estimated delivery of Tuesday, 09/29/23.    Ardeen Fillers, CPhT Oncology Pharmacy Patient Advocate  [facility]  [phone] (phone) [phone] (fax) 09/25/2023 9:58 AM

## 2023-09-26 ENCOUNTER — Other Ambulatory Visit: Payer: Self-pay | Admitting: Family Medicine

## 2023-09-26 DIAGNOSIS — I10 Essential (primary) hypertension: Secondary | ICD-10-CM

## 2023-09-28 ENCOUNTER — Telehealth: Payer: Self-pay | Admitting: Hematology & Oncology

## 2023-09-28 NOTE — Telephone Encounter (Signed)
Oral Chemotherapy Pharmacist Encounter   1st attempt to reach patient to provide update and offer for initial counseling on oral medication: Xeloda (capecitabine).   Patient's Xeloda should be delivering to patient's home on 09/29/23 per [facility].   No answer. Left voicemail for patient to call back to discuss details of medication acquisition and initial counseling session.  Sherry Ruffing, PharmD, BCPS, BCOP Hematology/Oncology Clinical Pharmacist [facility] Oral Chemotherapy Navigation Clinics [phone] 09/28/2023 9:29 AM

## 2023-09-28 NOTE — Telephone Encounter (Signed)
Called to schedule IV Iron per inbasket. LVM to return call for scheduling.

## 2023-09-30 NOTE — Telephone Encounter (Signed)
Oral Chemotherapy Pharmacist Encounter   2nd attempt made to reach patient to provide update and offer for initial counseling on oral medication: Xeloda (capecitabine).   No answer. Left voicemail for patient to call back to discuss details of medication acquisition and initial counseling session.  Sherry Ruffing, PharmD, BCPS, BCOP Hematology/Oncology Clinical Pharmacist [facility] Oral Chemotherapy Navigation Clinics [phone] 09/30/2023 11:09 AM

## 2023-10-01 ENCOUNTER — Telehealth: Payer: Self-pay | Admitting: Hematology & Oncology

## 2023-10-01 NOTE — Telephone Encounter (Signed)
Oral Chemotherapy Pharmacist Encounter   3rd attempt to reach patient to offer initial counseling on oral medication: Xeloda (capecitabine).   Patient did not answer and voicemail cut off, so unable to leave voicemail. Will sign off at this time. Patient had medication delivered to her home on 09/29/23.   Sherry Ruffing, PharmD, BCPS, BCOP Hematology/Oncology Clinical Pharmacist [facility] Oral Chemotherapy Navigation Clinics [phone] 10/01/2023 2:19 PM

## 2023-10-01 NOTE — Telephone Encounter (Signed)
Called to schedule IV iron. LVM to return call for scheduling. This is the 3rd attempt to reach out for scheduling.

## 2023-10-07 ENCOUNTER — Inpatient Hospital Stay: Payer: No Typology Code available for payment source | Attending: Hematology & Oncology

## 2023-10-07 VITALS — BP 115/72 | HR 109 | Temp 97.9°F | Resp 20 | Wt 160.0 lb

## 2023-10-07 DIAGNOSIS — D508 Other iron deficiency anemias: Secondary | ICD-10-CM

## 2023-10-07 DIAGNOSIS — D509 Iron deficiency anemia, unspecified: Secondary | ICD-10-CM | POA: Diagnosis present

## 2023-10-07 MED ORDER — HEPARIN SOD (PORK) LOCK FLUSH 100 UNIT/ML IV SOLN
500.0000 [IU] | Freq: Once | INTRAVENOUS | Status: AC | PRN
  Administered 2023-10-07: 500 [IU]

## 2023-10-07 MED ORDER — SODIUM CHLORIDE 0.9% FLUSH
10.0000 mL | Freq: Once | INTRAVENOUS | Status: AC | PRN
  Administered 2023-10-07: 10 mL

## 2023-10-07 MED ORDER — IRON SUCROSE 20 MG/ML IV SOLN
300.0000 mg | Freq: Once | INTRAVENOUS | Status: AC
  Administered 2023-10-07: 300 mg via INTRAVENOUS
  Filled 2023-10-07: qty 300

## 2023-10-07 MED ORDER — SODIUM CHLORIDE 0.9 % IV SOLN: INTRAVENOUS | Status: DC

## 2023-10-07 NOTE — Patient Instructions (Signed)

## 2023-10-12 ENCOUNTER — Other Ambulatory Visit: Payer: Self-pay | Admitting: Internal Medicine

## 2023-10-12 DIAGNOSIS — B20 Human immunodeficiency virus [HIV] disease: Secondary | ICD-10-CM

## 2023-10-13 NOTE — Telephone Encounter (Signed)
Please advise if okay to refill.

## 2023-10-14 ENCOUNTER — Other Ambulatory Visit: Payer: Self-pay | Admitting: Internal Medicine

## 2023-10-14 ENCOUNTER — Inpatient Hospital Stay: Payer: No Typology Code available for payment source

## 2023-10-14 VITALS — BP 120/77 | HR 92 | Temp 98.3°F | Resp 18

## 2023-10-14 DIAGNOSIS — B20 Human immunodeficiency virus [HIV] disease: Secondary | ICD-10-CM

## 2023-10-14 DIAGNOSIS — D508 Other iron deficiency anemias: Secondary | ICD-10-CM

## 2023-10-14 DIAGNOSIS — D509 Iron deficiency anemia, unspecified: Secondary | ICD-10-CM | POA: Diagnosis not present

## 2023-10-14 MED ORDER — HEPARIN SOD (PORK) LOCK FLUSH 100 UNIT/ML IV SOLN
500.0000 [IU] | Freq: Once | INTRAVENOUS | Status: AC
  Administered 2023-10-14: 500 [IU] via INTRAVENOUS

## 2023-10-14 MED ORDER — SODIUM CHLORIDE 0.9 % IV SOLN
INTRAVENOUS | Status: DC
Start: 2023-10-14 — End: 2023-10-14

## 2023-10-14 MED ORDER — SODIUM CHLORIDE 0.9% FLUSH
10.0000 mL | Freq: Once | INTRAVENOUS | Status: AC
  Administered 2023-10-14: 10 mL

## 2023-10-14 MED ORDER — SODIUM CHLORIDE 0.9 % IV SOLN
300.0000 mg | Freq: Once | INTRAVENOUS | Status: AC
  Administered 2023-10-14: 300 mg via INTRAVENOUS
  Filled 2023-10-14: qty 300

## 2023-10-14 MED ORDER — TRIAMCINOLONE ACETONIDE 0.025 % EX OINT: TOPICAL_OINTMENT | Freq: Two times a day (BID) | CUTANEOUS | 5 refills | Status: AC | PRN

## 2023-10-14 NOTE — Patient Instructions (Signed)

## 2023-10-14 NOTE — Progress Notes (Signed)
Patient does not want to stay for the recommended 30 minute post IV iron observation. Patient discharged ambulatory without complaints or concerns.

## 2023-10-21 ENCOUNTER — Encounter: Payer: Self-pay | Admitting: *Deleted

## 2023-10-21 ENCOUNTER — Inpatient Hospital Stay: Payer: No Typology Code available for payment source

## 2023-10-21 ENCOUNTER — Encounter: Payer: Self-pay | Admitting: Hematology & Oncology

## 2023-10-21 ENCOUNTER — Inpatient Hospital Stay (HOSPITAL_BASED_OUTPATIENT_CLINIC_OR_DEPARTMENT_OTHER): Payer: No Typology Code available for payment source | Admitting: Hematology & Oncology

## 2023-10-21 VITALS — Ht 65.0 in | Wt 163.0 lb

## 2023-10-21 DIAGNOSIS — C773 Secondary and unspecified malignant neoplasm of axilla and upper limb lymph nodes: Secondary | ICD-10-CM

## 2023-10-21 DIAGNOSIS — D508 Other iron deficiency anemias: Secondary | ICD-10-CM | POA: Diagnosis not present

## 2023-10-21 DIAGNOSIS — C50912 Malignant neoplasm of unspecified site of left female breast: Secondary | ICD-10-CM

## 2023-10-21 DIAGNOSIS — D509 Iron deficiency anemia, unspecified: Secondary | ICD-10-CM | POA: Diagnosis not present

## 2023-10-21 LAB — CMP (CANCER CENTER ONLY)
ALT: 19 U/L (ref 0–44)
AST: 27 U/L (ref 15–41)
Albumin: 3.7 g/dL (ref 3.5–5.0)
Alkaline Phosphatase: 147 U/L — ABNORMAL HIGH (ref 38–126)
Anion gap: 6 (ref 5–15)
BUN: 10 mg/dL (ref 6–20)
CO2: 22 mmol/L (ref 22–32)
Calcium: 9 mg/dL (ref 8.9–10.3)
Chloride: 110 mmol/L (ref 98–111)
Creatinine: 0.68 mg/dL (ref 0.44–1.00)
GFR, Estimated: >60 mL/min (ref >60)
Glucose, Bld: 98 mg/dL (ref 70–99)
Potassium: 3.8 mmol/L (ref 3.5–5.1)
Sodium: 138 mmol/L (ref 135–145)
Total Bilirubin: 0.3 mg/dL (ref 0.0–1.2)
Total Protein: 7.4 g/dL (ref 6.5–8.1)

## 2023-10-21 LAB — CBC WITH DIFFERENTIAL (CANCER CENTER ONLY)
Abs Immature Granulocytes: 0.11 10*3/uL — ABNORMAL HIGH (ref 0.00–0.07)
Basophils Absolute: 0 10*3/uL (ref 0.0–0.1)
Basophils Relative: 1 %
Eosinophils Absolute: 1.4 10*3/uL — ABNORMAL HIGH (ref 0.0–0.5)
Eosinophils Relative: 16 %
HCT: 30.5 % — ABNORMAL LOW (ref 36.0–46.0)
Hemoglobin: 9.7 g/dL — ABNORMAL LOW (ref 12.0–15.0)
Immature Granulocytes: 1 %
Lymphocytes Relative: 19 %
Lymphs Abs: 1.6 10*3/uL (ref 0.7–4.0)
MCH: 27.5 pg (ref 26.0–34.0)
MCHC: 31.8 g/dL (ref 30.0–36.0)
MCV: 86.4 fL (ref 80.0–100.0)
Monocytes Absolute: 0.5 10*3/uL (ref 0.1–1.0)
Monocytes Relative: 6 %
Neutro Abs: 4.9 10*3/uL (ref 1.7–7.7)
Neutrophils Relative %: 57 %
Platelet Count: 291 10*3/uL (ref 150–400)
RBC: 3.53 MIL/uL — ABNORMAL LOW (ref 3.87–5.11)
RDW: 18 % — ABNORMAL HIGH (ref 11.5–15.5)
WBC Count: 8.6 10*3/uL (ref 4.0–10.5)
nRBC: 0.2 % (ref 0.0–0.2)

## 2023-10-21 LAB — IRON AND IRON BINDING CAPACITY (CC-WL,HP ONLY)
Iron: 45 ug/dL (ref 28–170)
Saturation Ratios: 15 % (ref 10.4–31.8)
TIBC: 300 ug/dL (ref 250–450)
UIBC: 255 ug/dL (ref 148–442)

## 2023-10-21 LAB — RETICULOCYTES
Immature Retic Fract: 38.4 % — ABNORMAL HIGH (ref 2.3–15.9)
RBC.: 3.54 MIL/uL — ABNORMAL LOW (ref 3.87–5.11)
Retic Count, Absolute: 76.1 10*3/uL (ref 19.0–186.0)
Retic Ct Pct: 2.2 % (ref 0.4–3.1)

## 2023-10-21 LAB — FERRITIN: Ferritin: 189 ng/mL (ref 11–307)

## 2023-10-21 MED ORDER — SODIUM CHLORIDE 0.9 % IV SOLN: INTRAVENOUS | Status: DC

## 2023-10-21 MED ORDER — SODIUM CHLORIDE 0.9% FLUSH
10.0000 mL | Freq: Once | INTRAVENOUS | Status: AC
  Administered 2023-10-21: 10 mL via INTRAVENOUS

## 2023-10-21 MED ORDER — HEPARIN SOD (PORK) LOCK FLUSH 100 UNIT/ML IV SOLN
500.0000 [IU] | Freq: Once | INTRAVENOUS | Status: AC
  Administered 2023-10-21: 500 [IU] via INTRAVENOUS

## 2023-10-21 MED ORDER — SODIUM CHLORIDE 0.9 % IV SOLN
300.0000 mg | Freq: Once | INTRAVENOUS | Status: AC
  Administered 2023-10-21: 300 mg via INTRAVENOUS
  Filled 2023-10-21: qty 300

## 2023-10-21 NOTE — Patient Instructions (Signed)

## 2023-10-21 NOTE — Patient Instructions (Signed)

## 2023-10-21 NOTE — Progress Notes (Signed)
Patient is doing well on maintenance treatment. She has not has any navigational needs recently. As such will discontinue navigation, but be available to the patient as needed in the future.   Oncology Nurse Navigator Documentation     10/21/2023    9:45 AM  Oncology Nurse Navigator Flowsheets  Navigation Complete Date: 10/21/2023  Post Navigation: Continue to Follow Patient? No  Reason Not Navigating Patient: Patient On Maintenance Chemotherapy  Navigator Location [facility]  Navigator Encounter Type Follow-up Appt  Patient Visit Type MedOnc  Treatment Phase Active Tx  Barriers/Navigation Needs No Barriers At This Time  Interventions None Required  Acuity Level 1-No Barriers  Support Groups/Services Friends and Family  Time Spent with Patient 15

## 2023-10-21 NOTE — Progress Notes (Signed)
Is here Hematology and Oncology Follow Up Visit  Briana [name] [id number] 29-Nov-1975 48 y.o. 10/21/2023   Principle Diagnosis:  Stage IIIC (T4N3M0) TRIPLE NEGATIVE infiltrating ductal carcinoma of the left breast HIV (+)  Current Therapy:   Neoadjuvant  chemoimmunotherapy --- carboplatin/Taxol/pembrolizumab ---s/p cycle #3 - start on 02/19/2023 S/p LEFT MRM - 06/08/2023 --  Residual breast cancer - 3/3 nodes (-) XRT -- Start on 07/22/2023 --completed on 09/15/2023 Xeloda - start cycle #1 on 09/28/2023     Interim History:  Briana [name] is back for follow-up.  S she is tolerating the Xeloda quite nicely.  She has had no problems with it so far.  We did do a Doppler of her left leg.  This was done on 09/23/2023.  Thankfully, there was no evidence of DVT.  She is still working.  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  She has had no nausea or vomiting.  There is been no mouth sores.  She has had no pain in her hands or feet.  There is been no diarrhea.  She has had no issues with headache.  Overall, I would have to say that her performance status is ECOG 1.      Medications:  Current Outpatient Medications:    amLODipine (NORVASC) 10 MG tablet, TAKE 1 TABLET(10 MG) BY MOUTH DAILY, Disp: 90 tablet, Rfl: 0   bictegravir-emtricitabine-tenofovir AF (BIKTARVY) 50-200-25 MG TABS tablet, TAKE 1 TABLET DAILY, Disp: 30 tablet, Rfl: 11   capecitabine (XELODA) 500 MG tablet, Take 4 tablets (2,000 mg total) by mouth 2 (two) times daily after a meal. Take within 30 minutes after meals. Take for 14 days on, then off for 7 days. Repeat every 21 days., Disp: 112 tablet, Rfl: 4   DUPIXENT 300 MG/2ML SOAJ, Inject into the skin. Every 14 days., Disp: , Rfl:    ferrous sulfate 325 (65 FE) MG EC tablet, Take 325 mg by mouth daily with breakfast., Disp: , Rfl:    hydrochlorothiazide (HYDRODIURIL) 12.5 MG tablet, Take 1 tablet (12.5 mg total) by mouth daily., Disp: 90 tablet, Rfl: 3    HYDROcodone-acetaminophen (NORCO) 5-325 MG tablet, Take 1 tablet by mouth every 4 (four) hours as needed for severe pain (pain score 7-10). (Patient not taking: Reported on 10/21/2023), Disp: 10 tablet, Rfl: 0   potassium chloride SA (KLOR-CON M) 20 MEQ tablet, Take 2 tablets (40 mEq total) by mouth 2 (two) times daily. (Patient not taking: Reported on 10/21/2023), Disp: 60 tablet, Rfl: 0   triamcinolone (KENALOG) 0.025 % ointment, Apply topically 2 (two) times daily as needed. (Patient not taking: Reported on 10/21/2023), Disp: 454 g, Rfl: 5  Allergies:  Allergies  Allergen Reactions   Pumpkin Flavoring Agent (Non-Screening) Swelling    ONLY Lip swelling after eating pumpkin pie    Past Medical History, Surgical history, Social history, and Family History were reviewed and updated.  Review of Systems: Review of Systems  Constitutional: Negative.   HENT:  Negative.    Eyes: Negative.   Respiratory: Negative.    Cardiovascular: Negative.   Gastrointestinal: Negative.   Endocrine: Negative.   Genitourinary: Negative.    Musculoskeletal: Negative.   Skin: Negative.   Neurological: Negative.   Hematological: Negative.   Psychiatric/Behavioral: Negative.      Physical Exam: Temperature is 98.3.  Pulse 98.  Blood pressure 122/75.  Weight is 163 pounds.    Wt Readings from Last 3 Encounters:  10/21/23 163 lb 0.6 oz (74 kg)  10/07/23 160 lb (72.6 kg)  09/22/23 156 lb (70.8 kg)    Physical Exam Vitals reviewed.  Constitutional:      Comments: Right breast shows no masses edema or erythema.  There is no right axillary adenopathy.  Left breast really does not show any masses.  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  There is some slight skin breakdown.  There is no exudate.  I cannot palpate any distinct mass in the left breast.   I cannot palpate any left axillary lymph nodes.    HENT:     Head: Normocephalic and atraumatic.  Eyes:     Pupils: Pupils are  equal, round, and reactive to light.  Cardiovascular:     Rate and Rhythm: Normal rate and regular rhythm.     Heart sounds: Normal heart sounds.  Pulmonary:     Effort: Pulmonary effort is normal.     Breath sounds: Normal breath sounds.  Abdominal:     General: Bowel sounds are normal.     Palpations: Abdomen is soft.  Musculoskeletal:        General: No tenderness or deformity. Normal range of motion.     Cervical back: Normal range of motion.  Lymphadenopathy:     Cervical: No cervical adenopathy.  Skin:    General: Skin is warm and dry.     Findings: No erythema or rash.  Neurological:     Mental Status: She is alert and oriented to person, place, and time.  Psychiatric:        Behavior: Behavior normal.        Thought Content: Thought content normal.        Judgment: Judgment normal.     Lab Results  Component Value Date   WBC 8.6 10/21/2023   HGB 9.7 (L) 10/21/2023   HCT 30.5 (L) 10/21/2023   MCV 86.4 10/21/2023   PLT 291 10/21/2023     Chemistry      Component Value Date/Time   NA 138 10/21/2023 0835   K 3.8 10/21/2023 0835   CL 110 10/21/2023 0835   CO2 22 10/21/2023 0835   BUN 10 10/21/2023 0835   CREATININE 0.68 10/21/2023 0835   CREATININE 0.62 10/17/2022 0947      Component Value Date/Time   CALCIUM 9.0 10/21/2023 0835   ALKPHOS 147 (H) 10/21/2023 0835   AST 27 10/21/2023 0835   ALT 19 10/21/2023 0835   BILITOT 0.3 10/21/2023 0835     Impression and Plan: Briana [name] is a very nice 48 year old premenopausal Afro-American female with TRIPLE NEGATIVE breast cancer.  She has aggressive disease in my opinion.  She completed all of her had neoadjuvant chemotherapy.  She did very well with the chemotherapy/immunotherapy.  In my opinion, I think she really had a good response.  She is doing well on the Xeloda.  Her legs do not look as swollen.  I think that the eczema is also a little bit better.  I think we try to move her appointments out a little  bit longer now.  I will plan to get her back in 6 weeks.  I think she is to have surgery for reconstruction of the left breast and in June or July.  She is still somewhat anemic.  She has been getting iron infusions.  Will have to see what her erythropoietin level is.   Josph Macho, MD 2/19/20259:26 AM

## 2023-10-21 NOTE — Progress Notes (Signed)
BP remains elevated, 150/89, instructed to monitor at home and notify PCP if it remains over 140/90. Verbalized understanding.

## 2023-10-26 ENCOUNTER — Ambulatory Visit: Payer: Self-pay | Admitting: Radiation Oncology

## 2023-10-28 ENCOUNTER — Encounter: Payer: Self-pay | Admitting: Radiation Oncology

## 2023-10-28 NOTE — Progress Notes (Incomplete)
  Radiation Oncology         [phone] ________________________________  Name: Briana [name] MRN: [id number]  Date: 10/29/2023  DOB: Sep 06, 1975  End of Treatment Note  Diagnosis:   The encounter diagnosis was Breast cancer metastasized to axillary lymph node, left (HCC).   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  -- Invasive ductal carcinoma : ER+ / PR+ / Her2 - / Grade 3.      Indication for treatment:  curative        Radiation treatment dates:   First Treatment Date: 2023-07-23 Last Treatment Date: 2023-09-15  Site/Dose/Technique/Mode:   Plan Name: CW_L_BH Site: Chest Wall, Left Technique: 3D Mode: Photon Dose Per Fraction: 1.8 Gy Prescribed Dose (Delivered / Prescribed): 25.2 Gy / 25.2 Gy Prescribed Fxs (Delivered / Prescribed): 14 / 14   Plan Name: CW_L_BH_BO Site: Chest Wall, Left Technique: 3D Mode: Photon Dose Per Fraction: 1.8 Gy Prescribed Dose (Delivered / Prescribed): 25.2 Gy / 25.2 Gy Prescribed Fxs (Delivered / Prescribed): 14 / 14   Plan Name: CW_L_SCV_BH Site: Chest Wall, Left Technique: 3D Mode: Photon Dose Per Fraction: 1.8 Gy Prescribed Dose (Delivered / Prescribed): 50.4 Gy / 50.4 Gy Prescribed Fxs (Delivered / Prescribed): 28 / 28   Plan Name: CW_L_Bst_BO Site: Chest Wall, Left Technique: Electron Mode: Electron Dose Per Fraction: 2 Gy Prescribed Dose (Delivered / Prescribed): 10 Gy / 10 Gy Prescribed Fxs (Delivered / Prescribed): 5 / 5   Narrative: The patient tolerated radiation treatment relatively well. She did report experiencing mild fatigue and skin peeling in treatment field. Otherwise, no other significant concerns were reported.    Plan: The patient has completed radiation treatment. The  patient will return to radiation oncology clinic for routine followup in one month. I advised them to call or return sooner if they have any questions or concerns related to their recovery or treatment.  -----------------------------------  Billie Lade, PhD, MD  This document serves as a record of services personally performed by Antony Blackbird, MD. It was created on his behalf by Herbie Saxon, a trained medical scribe. The creation of this record is based on the scribe's personal observations and the provider's statements to them. This document has been checked and approved by the attending provider.

## 2023-10-28 NOTE — Progress Notes (Incomplete)
Radiation Oncology         [phone] ________________________________  Name: Briana [name] MRN: [id number]  Date: 10/29/2023  DOB: 07-30-1976  Follow-Up Visit Note  CC: Clayborne Dana, NP  Josph Macho, MD  No diagnosis found.  Diagnosis:  The encounter diagnosis was Breast cancer metastasized to axillary lymph node, left (HCC).   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  -- Invasive ductal carcinoma : ER+ / PR+ / Her2 - / Grade 3. --HIV +       Indication for treatment:  curative         Radiation treatment dates:   First Treatment Date: 2023-07-23 Last Treatment Date: 2023-09-15   Site/Dose/Technique/Mode:   Plan Name: CW_L_BH Site: Chest Wall, Left Technique: 3D Mode: Photon Dose Per Fraction: 1.8 Gy Prescribed Dose (Delivered / Prescribed): 25.2 Gy / 25.2 Gy Prescribed Fxs (Delivered / Prescribed): 14 / 14   Plan Name: CW_L_BH_BO Site: Chest Wall, Left Technique: 3D Mode: Photon Dose Per Fraction: 1.8 Gy Prescribed Dose (Delivered / Prescribed): 25.2 Gy / 25.2 Gy Prescribed Fxs (Delivered / Prescribed): 14 / 14   Plan Name: CW_L_SCV_BH Site: Chest Wall, Left Technique: 3D Mode: Photon Dose Per Fraction: 1.8 Gy Prescribed Dose (Delivered / Prescribed): 50.4 Gy / 50.4 Gy Prescribed Fxs (Delivered / Prescribed): 28 / 28   Plan Name: CW_L_Bst_BO Site: Chest Wall, Left Technique: Electron Mode: Electron Dose Per Fraction: 2 Gy Prescribed Dose (Delivered / Prescribed): 10 Gy / 10 Gy Prescribed Fxs (Delivered / Prescribed): 5 / 5  Narrative:  The patient returns today for routine follow-up. She was last seen in office on 07-09-23. Since then, patient completed er radiation therapy treatments which she tolerated  quite well other than experiencing some mild fatigue and skin peeling in treatment field.  Patient did present to the ED on 08-02-23 for an episode of zoster affecting her right chest and back which was treated with valacyclovir. During follow up with Dr. Myna Hidalgo on 09-22-23, she complained of an eczema flare up and leg edema. As a result, she underwent a left lower extremity venous doppler US on 09-23-23 which was unremarkable for any significant findings. Further blood work also indicated very low iron levels in which she was treated with 3 iron infusions. She was put on Xeloda which she started late January per Dr. Myna Hidalgo recommendation.    She presented to Dr. Kai Levins on 10-02-23 to discuss undergoing a autologous breast reconstructive options, patient was interested in undergoing a right breast reduction/lift for symmetry. During most recent visit with Dr. Myna Hidalgo on 10-21-23, she reported feeling well overall with no reported symptoms indicating disease recurrence.   No other significant oncologic interval history since the patient was last seen.   Allergies:  is allergic to pumpkin flavoring agent (non-screening).  Meds: Current Outpatient Medications  Medication Sig Dispense Refill   amLODipine (NORVASC) 10 MG tablet TAKE 1 TABLET(10 MG) BY MOUTH DAILY 90 tablet 0   bictegravir-emtricitabine-tenofovir AF (BIKTARVY) 50-200-25 MG TABS tablet TAKE 1 TABLET DAILY 30 tablet 11   capecitabine (XELODA) 500 MG tablet Take 4 tablets (2,000 mg total) by mouth 2 (two) times daily after a meal. Take within 30 minutes after meals. Take for 14 days on, then  off for 7 days. Repeat every 21 days. 112 tablet 4   DUPIXENT 300 MG/2ML SOAJ Inject into the skin. Every 14 days.     ferrous sulfate 325 (65 FE) MG EC tablet Take 325 mg by mouth daily with breakfast.     hydrochlorothiazide (HYDRODIURIL) 12.5 MG tablet Take 1 tablet (12.5 mg total) by mouth daily. 90 tablet 3   HYDROcodone-acetaminophen (NORCO)  5-325 MG tablet Take 1 tablet by mouth every 4 (four) hours as needed for severe pain (pain score 7-10). (Patient not taking: Reported on 10/21/2023) 10 tablet 0   potassium chloride SA (KLOR-CON M) 20 MEQ tablet Take 2 tablets (40 mEq total) by mouth 2 (two) times daily. (Patient not taking: Reported on 10/21/2023) 60 tablet 0   triamcinolone (KENALOG) 0.025 % ointment Apply topically 2 (two) times daily as needed. (Patient not taking: Reported on 10/21/2023) 454 g 5   No current facility-administered medications for this encounter.    Physical Findings: The patient is in no acute distress. Patient is alert and oriented.  vitals were not taken for this visit. .  No significant changes. Lungs are clear to auscultation bilaterally. Heart has regular rate and rhythm. No palpable cervical, supraclavicular, or axillary adenopathy. Abdomen soft, non-tender, normal bowel sounds.   Lab Findings: Lab Results  Component Value Date   WBC 8.6 10/21/2023   HGB 9.7 (L) 10/21/2023   HCT 30.5 (L) 10/21/2023   MCV 86.4 10/21/2023   PLT 291 10/21/2023    Radiographic Findings: No results found.  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  -- Invasive ductal carcinoma : ER+ / PR+ / Her2 - / Grade 3.      The patient is recovering from the effects of radiation.  ***  Plan:  ***   *** minutes of total time was spent for this patient encounter, including preparation, face-to-face counseling with the patient and coordination of care, physical exam, and documentation of the encounter. ____________________________________  Billie Lade, PhD, MD  This document serves as a record of services personally performed by Antony Blackbird, MD. It was created on his  behalf by Neena Rhymes, a trained medical scribe. The creation of this record is based on the scribe's personal observations and the provider's statements to them. This document has been checked and approved by the attending provider.

## 2023-10-29 ENCOUNTER — Ambulatory Visit
Admission: RE | Admit: 2023-10-29 | Payer: No Typology Code available for payment source | Source: Ambulatory Visit | Admitting: Radiation Oncology

## 2023-10-29 HISTORY — DX: Personal history of irradiation: Z92.3

## 2023-11-03 NOTE — Progress Notes (Signed)
 Briana [name] is here today for follow up post radiation to the breast.   Breast Side: Left   They completed their radiation on: 2023-09-15   Does the patient complain of any of the following: Post radiation skin issues: Denies Breast Tenderness: Denies Breast Swelling: Denies Lymphadema: Denies Range of Motion limitations: Denies Fatigue post radiation: Denies Appetite good/fair/poor: Fair    BP (!) 135/96 (BP Location: Left Arm, Patient Position: Sitting, Cuff Size: Normal)   Pulse (!) 106   Temp (!) 97.3 F (36.3 C)   Resp 18   Ht 5\' 5"  (1.651 m)   Wt 141 lb 9.6 oz (64.2 kg)   SpO2 100%   BMI 23.56 kg/m

## 2023-11-04 NOTE — Progress Notes (Signed)
 Radiation Oncology         (336) [phone] ________________________________  Name: Briana [name] MRN: [id number]  Date: 11/05/2023  DOB: [birth year]  Follow-Up Visit Note  CC: Clayborne Dana, NP  Josph Macho, MD  No diagnosis found.  Diagnosis:  The encounter diagnosis was Breast cancer metastasized to axillary lymph node, left (HCC).   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  -- Invasive ductal carcinoma : ER+ / PR+ / Her2 - / Grade 3. --HIV +       Indication for treatment:  curative        Interval Since Last Radiation: 1 month 21 days  Radiation treatment dates:   First Treatment Date: 2023-07-23 Last Treatment Date: 2023-09-15   Site/Dose/Technique/Mode:   Plan Name: CW_L_BH Site: Chest Wall, Left Technique: 3D Mode: Photon Dose Per Fraction: 1.8 Gy Prescribed Dose (Delivered / Prescribed): 25.2 Gy / 25.2 Gy Prescribed Fxs (Delivered / Prescribed): 14 / 14   Plan Name: CW_L_BH_BO Site: Chest Wall, Left Technique: 3D Mode: Photon Dose Per Fraction: 1.8 Gy Prescribed Dose (Delivered / Prescribed): 25.2 Gy / 25.2 Gy Prescribed Fxs (Delivered / Prescribed): 14 / 14   Plan Name: CW_L_SCV_BH Site: Chest Wall, Left Technique: 3D Mode: Photon Dose Per Fraction: 1.8 Gy Prescribed Dose (Delivered / Prescribed): 50.4 Gy / 50.4 Gy Prescribed Fxs (Delivered / Prescribed): 28 / 28   Plan Name: CW_L_Bst_BO Site: Chest Wall, Left Technique: Electron Mode: Electron Dose Per Fraction: 2 Gy Prescribed Dose (Delivered / Prescribed): 10 Gy / 10 Gy Prescribed Fxs (Delivered / Prescribed): 5 / 5  Narrative:  The patient returns today for routine follow-up. She was last seen in office on 07-09-23. Since then, patient completed er  radiation therapy treatments which she tolerated quite well other than experiencing some mild fatigue and skin peeling in treatment field.  Patient did present to the ED on 08-02-23 for an episode of zoster affecting her right chest and back which was treated with valacyclovir. During follow up with Dr. Myna Hidalgo on 09-22-23, she complained of an eczema flare up and leg edema. As a result, she underwent a left lower extremity venous doppler US on 09-23-23 which was unremarkable for any significant findings. Further blood work also indicated very low iron levels in which she was treated with 3 iron infusions. She was put on Xeloda which she started late January per Dr. Myna Hidalgo recommendation.    She presented to Dr. Kai Levins on 10-02-23 to discuss undergoing a autologous breast reconstructive options, patient was interested in undergoing a right breast reduction/lift for symmetry. During most recent visit with Dr. Myna Hidalgo on 10-21-23, she reported feeling well overall with no reported symptoms indicating disease recurrence.   No other significant oncologic interval history since the patient was last seen.   Allergies:  is allergic to pumpkin flavoring agent (non-screening).  Meds: Current Outpatient Medications  Medication Sig Dispense Refill   amLODipine (NORVASC) 10 MG tablet TAKE 1 TABLET(10 MG) BY MOUTH DAILY 90 tablet 0   bictegravir-emtricitabine-tenofovir AF (BIKTARVY) 50-200-25 MG TABS tablet TAKE 1 TABLET DAILY 30 tablet 11   capecitabine (XELODA) 500 MG tablet Take 4 tablets (2,000 mg total) by mouth 2 (two) times daily after a meal. Take within 30 minutes  after meals. Take for 14 days on, then off for 7 days. Repeat every 21 days. 112 tablet 4   DUPIXENT 300 MG/2ML SOAJ Inject into the skin. Every 14 days.     ferrous sulfate 325 (65 FE) MG EC tablet Take 325 mg by mouth daily with breakfast.     hydrochlorothiazide (HYDRODIURIL) 12.5 MG tablet Take 1 tablet (12.5 mg total) by mouth daily. 90  tablet 3   HYDROcodone-acetaminophen (NORCO) 5-325 MG tablet Take 1 tablet by mouth every 4 (four) hours as needed for severe pain (pain score 7-10). (Patient not taking: Reported on 10/21/2023) 10 tablet 0   potassium chloride SA (KLOR-CON M) 20 MEQ tablet Take 2 tablets (40 mEq total) by mouth 2 (two) times daily. (Patient not taking: Reported on 10/21/2023) 60 tablet 0   triamcinolone (KENALOG) 0.025 % ointment Apply topically 2 (two) times daily as needed. (Patient not taking: Reported on 10/21/2023) 454 g 5   No current facility-administered medications for this encounter.    Physical Findings: The patient is in no acute distress. Patient is alert and oriented.  vitals were not taken for this visit. .  No significant changes. Lungs are clear to auscultation bilaterally. Heart has regular rate and rhythm. No palpable cervical, supraclavicular, or axillary adenopathy. Abdomen soft, non-tender, normal bowel sounds.   Lab Findings: Lab Results  Component Value Date   WBC 8.6 10/21/2023   HGB 9.7 (L) 10/21/2023   HCT 30.5 (L) 10/21/2023   MCV 86.4 10/21/2023   PLT 291 10/21/2023    Radiographic Findings: No results found.  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  -- Invasive ductal carcinoma : ER+ / PR+ / Her2 - / Grade 3.      The patient is recovering from the effects of radiation.  ***  Plan:  ***   *** minutes of total time was spent for this patient encounter, including preparation, face-to-face counseling with the patient and coordination of care, physical exam, and documentation of the encounter. ____________________________________  Billie Lade, PhD, MD  This document serves as a record of services personally performed  by Antony Blackbird, MD. It was created on his behalf by Herbie Saxon, a trained medical scribe. The creation of this record is based on the scribe's personal observations and the provider's statements to them. This document has been checked and approved by the attending provider.

## 2023-11-04 NOTE — Progress Notes (Signed)
  Radiation Oncology         (336) [phone] ________________________________  Name: Briana [name] MRN: [id number]  Date: 11/05/2023  DOB: 04-16-1976  End of Treatment Note  Diagnosis:   The encounter diagnosis was Breast cancer metastasized to axillary lymph node, left (HCC).   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  -- Invasive ductal carcinoma : ER+ / PR+ / Her2 - / Grade 3.      Indication for treatment:  curative        Radiation treatment dates:   First Treatment Date: 2023-07-23 Last Treatment Date: 2023-09-15  Site/Dose/Technique/Mode:   Plan Name: CW_L_BH Site: Chest Wall, Left Technique: 3D Mode: Photon Dose Per Fraction: 1.8 Gy Prescribed Dose (Delivered / Prescribed): 25.2 Gy / 25.2 Gy Prescribed Fxs (Delivered / Prescribed): 14 / 14   Plan Name: CW_L_BH_BO Site: Chest Wall, Left Technique: 3D Mode: Photon Dose Per Fraction: 1.8 Gy Prescribed Dose (Delivered / Prescribed): 25.2 Gy / 25.2 Gy Prescribed Fxs (Delivered / Prescribed): 14 / 14   Plan Name: CW_L_SCV_BH Site: Chest Wall, Left Technique: 3D Mode: Photon Dose Per Fraction: 1.8 Gy Prescribed Dose (Delivered / Prescribed): 50.4 Gy / 50.4 Gy Prescribed Fxs (Delivered / Prescribed): 28 / 28   Plan Name: CW_L_Bst_BO Site: Chest Wall, Left Technique: Electron Mode: Electron Dose Per Fraction: 2 Gy Prescribed Dose (Delivered / Prescribed): 10 Gy / 10 Gy Prescribed Fxs (Delivered / Prescribed): 5 / 5   Narrative: The patient tolerated radiation treatment relatively well. She did report experiencing mild fatigue and skin peeling in treatment field. Otherwise, no other significant concerns were reported.    Plan: The patient has completed radiation treatment. The  patient will return to radiation oncology clinic for routine followup in one month. She was advised to call or return sooner if they have any questions or concerns related to their recovery or treatment.  -----------------------------------   Bryan Lemma, PA-C   This document serves as a record of services personally performed by Bryan Lemma, PA-C. It was created on his behalf by Herbie Saxon, a trained medical scribe. The creation of this record is based on the scribe's personal observations and the provider's statements to them. This document has been checked and approved by the attending provider.

## 2023-11-05 ENCOUNTER — Encounter: Payer: Self-pay | Admitting: Radiation Oncology

## 2023-11-05 ENCOUNTER — Ambulatory Visit
Admission: RE | Admit: 2023-11-05 | Discharge: 2023-11-05 | Disposition: A | Discharge status: Home / Self Care | Payer: No Typology Code available for payment source | Source: Ambulatory Visit | Attending: Radiation Oncology | Admitting: Radiation Oncology

## 2023-11-05 VITALS — BP 135/96 | HR 106 | Temp 97.3°F | Resp 18 | Ht 65.0 in | Wt 141.6 lb

## 2023-11-05 DIAGNOSIS — Z923 Personal history of irradiation: Secondary | ICD-10-CM | POA: Insufficient documentation

## 2023-11-05 DIAGNOSIS — C773 Secondary and unspecified malignant neoplasm of axilla and upper limb lymph nodes: Secondary | ICD-10-CM | POA: Diagnosis not present

## 2023-11-05 DIAGNOSIS — C50412 Malignant neoplasm of upper-outer quadrant of left female breast: Secondary | ICD-10-CM | POA: Insufficient documentation

## 2023-11-05 DIAGNOSIS — C50912 Malignant neoplasm of unspecified site of left female breast: Secondary | ICD-10-CM

## 2023-11-05 NOTE — Addendum Note (Signed)
 Encounter addended by: Rana Snare, LPN on: 09/06/1094 10:29 AM  Actions taken: Charge Capture section accepted

## 2023-11-11 ENCOUNTER — Telehealth: Payer: Self-pay

## 2023-11-11 ENCOUNTER — Encounter: Payer: Self-pay | Admitting: Family

## 2023-11-11 NOTE — Progress Notes (Signed)
 Radiation Oncology         [phone] ________________________________  Name: Briana [name] MRN: [id number]  Date: 11/12/2023  DOB: 04-Jul-1976  Follow-Up Visit Note  CC: Clayborne Dana, NP  Clayborne Dana, NP  No diagnosis found.  Diagnosis:  The encounter diagnosis was Breast cancer metastasized to axillary lymph node, left (HCC).   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  -- Invasive ductal carcinoma : ER+ / PR+ / Her2 - / Grade 3.      Interval Since Last Radiation:  1 months 28 days  Indication for treatment:  curative         Radiation treatment dates:   First Treatment Date: 2023-07-23 Last Treatment Date: 2023-09-15   Site/Dose/Technique/Mode:   Plan Name: CW_L_BH Site: Chest Wall, Left Technique: 3D Mode: Photon Dose Per Fraction: 1.8 Gy Prescribed Dose (Delivered / Prescribed): 25.2 Gy / 25.2 Gy Prescribed Fxs (Delivered / Prescribed): 14 / 14   Plan Name: CW_L_BH_BO Site: Chest Wall, Left Technique: 3D Mode: Photon Dose Per Fraction: 1.8 Gy Prescribed Dose (Delivered / Prescribed): 25.2 Gy / 25.2 Gy Prescribed Fxs (Delivered / Prescribed): 14 / 14   Plan Name: CW_L_SCV_BH Site: Chest Wall, Left Technique: 3D Mode: Photon Dose Per Fraction: 1.8 Gy Prescribed Dose (Delivered / Prescribed): 50.4 Gy / 50.4 Gy Prescribed Fxs (Delivered / Prescribed): 28 / 28   Plan Name: CW_L_Bst_BO Site: Chest Wall, Left Technique: Electron Mode: Electron Dose Per Fraction: 2 Gy Prescribed Dose (Delivered / Prescribed): 10 Gy / 10 Gy Prescribed Fxs (Delivered / Prescribed): 5 / 5  Narrative:  The patient returns today for routine follow-up. She was last seen in office on 11-05-23 for a routine follow up. She messaged our office  yesterday complaining of left-sided edema that is warm to the touch. She denied any accompanying pain, fever or chills.   No other significant oncologic interval history since the patient was last seen.                                Allergies:  is allergic to pumpkin flavoring agent (non-screening).  Meds: Current Outpatient Medications  Medication Sig Dispense Refill   amLODipine (NORVASC) 10 MG tablet TAKE 1 TABLET(10 MG) BY MOUTH DAILY 90 tablet 0   bictegravir-emtricitabine-tenofovir AF (BIKTARVY) 50-200-25 MG TABS tablet TAKE 1 TABLET DAILY 30 tablet 11   capecitabine (XELODA) 500 MG tablet Take 4 tablets (2,000 mg total) by mouth 2 (two) times daily after a meal. Take within 30 minutes after meals. Take for 14 days on, then off for 7 days. Repeat every 21 days. 112 tablet 4   DUPIXENT 300 MG/2ML SOAJ Inject into the skin. Every 14 days.     ferrous sulfate 325 (65 FE) MG EC tablet Take 325 mg by mouth daily with breakfast.     hydrochlorothiazide (HYDRODIURIL) 12.5 MG tablet Take 1 tablet (12.5 mg total) by mouth daily. 90 tablet 3   HYDROcodone-acetaminophen (NORCO) 5-325 MG tablet Take 1 tablet by mouth every 4 (four) hours as needed for severe pain (pain score 7-10). 10 tablet 0   potassium chloride SA (KLOR-CON M) 20 MEQ tablet Take 2 tablets (40 mEq total)  by mouth 2 (two) times daily. 60 tablet 0   triamcinolone (KENALOG) 0.025 % ointment Apply topically 2 (two) times daily as needed. 454 g 5   No current facility-administered medications for this encounter.    Physical Findings: The patient is in no acute distress. Patient is alert and oriented.  vitals were not taken for this visit. .  No significant changes. Lungs are clear to auscultation bilaterally. Heart has regular rate and rhythm. No palpable cervical, supraclavicular, or axillary adenopathy. Abdomen soft, non-tender, normal bowel sounds.   Lab Findings: Lab Results  Component Value Date   WBC 8.6 10/21/2023    HGB 9.7 (L) 10/21/2023   HCT 30.5 (L) 10/21/2023   MCV 86.4 10/21/2023   PLT 291 10/21/2023    Radiographic Findings: No results found.  Impression:  The encounter diagnosis was Breast cancer metastasized to axillary lymph node, left (HCC). 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  -- Invasive ductal carcinoma : ER+ / PR+ / Her2 - / Grade 3.      The patient is recovering from the effects of radiation.  ***  Plan:  ***   *** minutes of total time was spent for this patient encounter, including preparation, face-to-face counseling with the patient and coordination of care, physical exam, and documentation of the encounter. ____________________________________  Billie Lade, PhD, MD  This document serves as a record of services personally performed by Antony Blackbird, MD. It was created on his behalf by Herbie Saxon, a trained medical scribe. The creation of this record is based on the scribe's personal observations and the provider's statements to them. This document has been checked and approved by the attending provider.

## 2023-11-11 NOTE — Telephone Encounter (Signed)
 Briana [name] called in stating that she was experiencing some swelling on her left side in treatment field. She completed radiation treatment on 09/15/2023. She stated that it is warm to touch, but no pain. Denies fever or chills.  Please advise.

## 2023-11-12 ENCOUNTER — Ambulatory Visit
Admission: RE | Admit: 2023-11-12 | Discharge: 2023-11-12 | Disposition: A | Discharge status: Home / Self Care | Source: Ambulatory Visit | Attending: Radiation Oncology | Admitting: Radiation Oncology

## 2023-11-12 ENCOUNTER — Encounter: Payer: Self-pay | Admitting: Radiation Oncology

## 2023-11-12 VITALS — BP 118/75 | HR 116 | Temp 99.1°F | Resp 18

## 2023-11-12 DIAGNOSIS — Z171 Estrogen receptor negative status [ER-]: Secondary | ICD-10-CM | POA: Insufficient documentation

## 2023-11-12 DIAGNOSIS — Z79899 Other long term (current) drug therapy: Secondary | ICD-10-CM | POA: Insufficient documentation

## 2023-11-12 DIAGNOSIS — Z79624 Long term (current) use of inhibitors of nucleotide synthesis: Secondary | ICD-10-CM | POA: Insufficient documentation

## 2023-11-12 DIAGNOSIS — C50412 Malignant neoplasm of upper-outer quadrant of left female breast: Secondary | ICD-10-CM | POA: Insufficient documentation

## 2023-11-12 DIAGNOSIS — Z9221 Personal history of antineoplastic chemotherapy: Secondary | ICD-10-CM | POA: Insufficient documentation

## 2023-11-12 DIAGNOSIS — Z923 Personal history of irradiation: Secondary | ICD-10-CM | POA: Diagnosis not present

## 2023-11-12 MED ORDER — SULFAMETHOXAZOLE-TRIMETHOPRIM 800-160 MG PO TABS: 2.0000 | ORAL_TABLET | Freq: Two times a day (BID) | ORAL | Status: DC

## 2023-11-12 MED ORDER — SULFAMETHOXAZOLE-TRIMETHOPRIM 800-160 MG PO TABS: 2.0000 | ORAL_TABLET | Freq: Two times a day (BID) | ORAL | 0 refills | Status: DC

## 2023-11-12 NOTE — Progress Notes (Addendum)
 Briana [name] is here today for follow up post radiation to the breast.   Breast Side:Left   They completed their radiation on: 09/15/23  Does the patient complain of any of the following: Post radiation skin issues: Denies   Breast Tenderness: Denies Breast Swelling: Yes Lymphadema: Denies Range of Motion limitations: Denies Fatigue post radiation: Denies Appetite good/fair/poor: Fair   BP 118/75 (BP Location: Right Arm, Patient Position: Sitting)   Pulse (!) 116   Temp 99.1 F (37.3 C)   Resp 18   SpO2 100%

## 2023-11-23 ENCOUNTER — Encounter: Payer: Self-pay | Admitting: Family

## 2023-11-23 NOTE — Therapy (Addendum)
 OUTPATIENT PHYSICAL THERAPY  UPPER EXTREMITY ONCOLOGY EVALUATION  Patient Name: Briana [name] MRN: [id number] DOB:1975/11/29, 48 y.o., female Today's Date: 11/24/2023  END OF SESSION:  PT End of Session - 11/24/23 1639     Visit Number 1    Number of Visits 12    Date for PT Re-Evaluation 01/05/24    PT Start Time 0903    PT Stop Time 0956    PT Time Calculation (min) 53 min    Activity Tolerance Patient tolerated treatment well    Behavior During Therapy [facility] [place] for tasks assessed/performed             Past Medical History:  Diagnosis Date   Abnormal Pap smear    s/p colposcopy    Anemia    Breast cancer (HCC) 12/2022   Breast cancer metastasized to axillary lymph node, left (HCC) 02/12/2023   Chronic kidney disease 03/01/2013   kidney infection   History of radiation therapy    left chest wall-07/23/23-09/15/23- Dr. Antony Blackbird   HIV infection [facility])    Screening for malignant neoplasm of the cervix    Past Surgical History:  Procedure Laterality Date   BREAST BIOPSY Left 01/20/2023   [place] LT BREAST BX W LOC DEV 1ST LESION IMG BX SPEC US GUIDE 01/20/2023 [facility] MAMMOGRAPHY   BREAST BIOPSY Left 06/04/2023   [place] LT RADIOACTIVE SEED LOC 06/04/2023 [facility] MAMMOGRAPHY   BREAST RECONSTRUCTION WITH PLACEMENT OF TISSUE EXPANDER AND ALLODERM Left 06/08/2023   Procedure: LEFT BREAST RECONSTRUCTION WITH PLACEMENT OF TISSUE EXPANDER AND ALLODERM;  Surgeon: Glenna Fellows, MD;  Location: [facility];  Service: Plastics;  Laterality: Left;   CESAREAN SECTION     2000/2008   IR IMAGING GUIDED PORT INSERTION  02/06/2023   MASTECTOMY WITH AXILLARY LYMPH NODE DISSECTION Left 06/08/2023   Procedure: LEFT MASTECTOMY WITH TARGETED LYMPH NODE DISSECTION;  Surgeon: [name], MD;  Location: [facility];  Service: General;  Laterality: Left;  LMA PEC BLOCK   TUBAL LIGATION     Patient Active Problem List   Diagnosis Date Noted   Need for prophylactic  vaccination and inoculation against influenza 07/07/2023   Breast cancer, left breast (HCC) 06/08/2023   Breast cancer metastasized to axillary lymph node, left (HCC) 02/12/2023   Breast lump in female 01/08/2023   HTN (hypertension) 04/29/2022   Routine screening for STI (sexually transmitted infection) 10/03/2020   History of long-term treatment with high-risk medication 10/03/2020   Impetigo 10/03/2020   Screening for cervical cancer 05/23/2019   Heavy menstrual bleeding 05/23/2019   Thrombocytosis 11/26/2016   Eczema 09/11/2011   History of abnormal cervical Pap smear 01/24/2011   Iron deficiency anemia 01/23/2011   Human immunodeficiency virus (HIV) disease (HCC) 10/19/2006    PCP: Hyman Hopes, NP  REFERRING PROVIDER: Glenna Fellows, MD  REFERRING DIAG: Left breast swelling   THERAPY DIAG:  Malignant neoplasm of left breast in female, estrogen receptor negative, unspecified site of breast [facility])  Aftercare following surgery for neoplasm  Localized edema  Lymphedema, not elsewhere classified  ONSET DATE: 11/12/2023  Rationale for Evaluation and Treatment: Rehabilitation  SUBJECTIVE:  SUBJECTIVE STATEMENT:  Around 11/12/2023 my left breast got swollen and warm.  Dr. Roselind Messier prescribed the Bactrim and I have been taking it since March 15. It is almost gone. Dr. Leta Baptist didn't think she had infection, but wanted her to continue the bactrim for prevention. Last night she developed some nagging pain at the lateral breast. The swelling is all over. She is finished with her fills now. Md prescribed a compression bra, but she has not gotten yet. She has appt Mar 31,  PERTINENT HISTORY:  Pt had neoadjuvant chemotherapy for her triple negative breast cancer, followed by left Mastectomy with  immediate reconstruction with TE on 06/08/2023, and radiation which ended in January 2025. She developed swelling in the left breast and was placed on Bactim on 11/12/2023 by Rad/Onc. Saw Dr. Leta Baptist on 11/19/23 who saw no signs of infection, but recommended compression bra for swelling. She has a tissue expander in place.  PAIN:  Are you having pain? No, not presently NPRS scale: 0-5/10 Pain location: left lateral breast Pain orientation: Left  PAIN TYPE: sharp Pain description: intermittent and sharp  Aggravating factors: nothing that she knows of Relieving factors: nothing that she knows of  PRECAUTIONS: HIV,  RED FLAGS: None   WEIGHT BEARING RESTRICTIONS: No  FALLS:  Has patient fallen in last 6 months? No  LIVING ENVIRONMENT: Lives with: lives with their family, daughter  Lives in: House/apartment   OCCUPATION: Security at [employer]  LEISURE: walks  HAND DOMINANCE: right   PRIOR LEVEL OF FUNCTION: Independent  PATIENT GOALS: reduce left breast swelling   OBJECTIVE: Note: Objective measures were completed at Evaluation unless otherwise noted.  COGNITION: Overall cognitive status: Within functional limits for tasks assessed   PALPATION: Left breast very warm/hot, min redness, but difficult  to tell due to dark pigmentation, mild tenderness, skin is shiny  OBSERVATIONS / OTHER ASSESSMENTS: Porta cath present Right chest, Left breast :Enlarged pores, hyperpigmentation and generalized left breast swelling with expander still in place and mastectomy flap  SENSATION: Light touch: Deficits     POSTURE: forward head, rounded shoulders  UPPER EXTREMITY AROM/PROM:  A/PROM RIGHT   eval   Shoulder extension   Shoulder flexion WNL  Shoulder abduction WNL  Shoulder internal rotation   Shoulder external rotation     (Blank rows = not tested)  A/PROM LEFT   eval  Shoulder extension   Shoulder flexion WNL  Shoulder abduction WNL  Shoulder internal rotation    Shoulder external rotation     (Blank rows = not tested)  CERVICAL AROM: All within functional limits:     UPPER EXTREMITY STRENGTH:   LYMPHEDEMA ASSESSMENTS:   SURGERY TYPE/DATE:  06/08/2023 left mastectomy and sentinel node biopsy with immediate expander placement   NUMBER OF LYMPH NODES REMOVED: 0/2  CHEMOTHERAPY: neoadjuvant  RADIATION:YES, ended in January, 2025  HORMONE TREATMENT: NO  INFECTIONS: given Bactrim on 11/12/2023 for possible infection   LYMPHEDEMA ASSESSMENTS:   LANDMARK RIGHT  eval  At axilla  27.3  15 cm proximal to olecranon process   10 cm proximal to olecranon process 23.7  Olecranon process 21.5  15 cm proximal to ulnar styloid process   10 cm proximal to ulnar styloid process 18.5  Just proximal to ulnar styloid process 14.5  Across hand at thumb web space 18.0  At base of 2nd digit 5.3  (Blank rows = not tested)  LANDMARK LEFT  eval  At axilla  27.0  15 cm proximal to olecranon process  10 cm proximal to olecranon process 24.0  Olecranon process 22.0  15 cm proximal to ulnar styloid process   10 cm proximal to ulnar styloid process 18.3  Just proximal to ulnar styloid process 14.3  Across hand at thumb web space 18.1  At base of 2nd digit 5.3  (Blank rows = not tested)   FUNCTIONAL TESTS:    GAIT:WNL    L-DEX LYMPHEDEMA SCREENING: The patient was assessed using the L-Dex machine today to produce a lymphedema index baseline score. The patient will be reassessed on a regular basis (typically every 3 months) to obtain new L-Dex scores. If the score is > 6.5 points away from his/her baseline score indicating onset of subclinical lymphedema, it will be recommended to wear a compression garment for 4 weeks, 12 hours per day and then be reassessed. If the score continues to be > 6.5 points from baseline at reassessment, we will initiate lymphedema treatment. Assessing in this manner has a 95% rate of preventing clinically significant  lymphedema.    BREAST COMPLAINTS QUESTIONNAIRE Pain:5 Heaviness:8 Swollen feeling:10 Tense Skin:10 Redness:1 Bra Print:0 Size of Pores:3 Hard feeling: 10 Total:   47  /80 A Score over 9 indicates lymphedema issues in the breast                                                                                                                            TREATMENT DATE:  11/24/2023 Discussion about importance of compression bra to decrease swelling if no infection. Showed pictures on wall of lymphatics showing superficial nature of lymphatics and why technique is so gentle; ie light stretch. Initiated MLD : short neck, bilateral axillary LN's, left inguinal LN's, anterior interaxillary pathway, left axillo inguinal pathway. Initiated to left breast and noted again how warm/hot breast is. Decided to hold MLD due to significant heat and messaged Dr. Roselind Messier and Dr. Leta Baptist. Dr. Roselind Messier deferred to Dr. Leta Baptist. Since Dr. Leta Baptist had seen pt 2x's over the last week she said she Does NOT have infection and she wants MLD to swollen Mastectomy flap area. This therapist was aware MD had seen her 2x's but also had Gwenevere Abbot check pt and she agreed she was very warm/hot. Will continue treatment per Dr. Leta Baptist.    PATIENT EDUCATION:  Education details: concerns of infection due to heat, Kara(therapist also agreed), Compression bra, MLD, Messaged Dr Roselind Messier and [place] Person educated: Patient and Parent Education method: Explanation Education comprehension: verbalized understanding  HOME EXERCISE PROGRAM: NA  ASSESSMENT:  CLINICAL IMPRESSION:  Addendum 11/30/2023: Pt had spontaneous drainage from left breast and had follow up with Dr. Leta Baptist today. After consult with Dr. Leta Baptist she has opted to have tissue expander removed. Per MD she is high risk wound healing due to RT. Surgery scheduled for 12/04/2023. Pt will need new referral from Dr. Leta Baptist if she returns for therapy. Patient  is a 48 y.o. female who was seen today for physical therapy evaluation and treatment  for complaints of left breast swelling. She is s/p neoadjuvant chemotherapy, left mastectomy with immediate TE, and adjuvant radiation. Radiation ended in [facility] Jan. She had left chest wall pain that started on 11/07/2023 and left breast swelling was noted to start around 11/12/2023 and she was placed on Bactrim. Therapist had concerns of infection due to significant warmth/heat to left breast. Another therapist also concurred. Messaged Dr. Leta Baptist MD who responded quickly indicating she had seen her twice over the last week and there was no infection and OK to treat. Pt with complaints of heaviness, in left breast with enlarged pores and generalized left breast/mastectomy flap swelling. The skin especially laterally is very shiny. She will benefit from skilled PT to address swelling and instruct pt in MLD techniques to reduce swelling.   OBJECTIVE IMPAIRMENTS: decreased knowledge of condition, increased edema, and postural dysfunction.   ACTIVITY LIMITATIONS: carrying and reach over head  PARTICIPATION LIMITATIONS: cleaning  PERSONAL FACTORS: 1-2 comorbidities: Left Breast cancer s/p chemotherapy and radiation  are also affecting patient's functional outcome.   REHAB POTENTIAL: Good  CLINICAL DECISION MAKING: Evolving/moderate complexity  EVALUATION COMPLEXITY: Moderate  GOALS: Goals reviewed with patient? Yes  SHORT TERM GOALS: Target date: 12/15/2023  Pt will purchase a compression bra to decrease lymphedema Baseline: Goal status: INITIAL  2.  Pt will report left breast swelling improved by 25% Baseline:  Goal status: INITIAL  3.  Breast complaints questionnaire will be reduced to no greater than 25 to demonstrate improved swelling Baseline:  Goal status: INITIAL    LONG TERM GOALS: Target date: 01/05/2024  Pt will be independent in self MLD to the left breast Baseline:  Goal status:  INITIAL  2.  Pt will report decreased left breast swelling by 50% or more Baseline:  Goal status: INITIAL  3.  Breast complaints questionnaire witll be reduced to no greater than 9 to demonstrate improved swelling Baseline:  Goal status: INITIAL   PLAN:  PT FREQUENCY: 2x/week  PT DURATION: 6 weeks  PLANNED INTERVENTIONS: 97164- PT Re-evaluation, 97110-Therapeutic exercises, 97530- Therapeutic activity, 97112- Neuromuscular re-education, 97535- Self Care, 02725- Manual therapy, and 97760- Orthotic Fit/training  PLAN FOR NEXT SESSION:  EVAL:check mastectomy flap, breast swelling, initiate MLD. (PER message from Dr. Leta Baptist, no Infection) 11/30/2023 Addendeum: pt with spontaneous drainage from left breast. Decided to have tissue expander removed and will do so on 12/04/2023. On hold at this time. Will need new referral to return.  Waynette Buttery, PT 11/24/2023, 5:05 PM Alvira Monday, PT 11/30/23 5:22 PM

## 2023-11-24 ENCOUNTER — Ambulatory Visit: Attending: Plastic Surgery

## 2023-11-24 ENCOUNTER — Other Ambulatory Visit: Payer: Self-pay

## 2023-11-24 DIAGNOSIS — R600 Localized edema: Secondary | ICD-10-CM

## 2023-11-24 DIAGNOSIS — Z171 Estrogen receptor negative status [ER-]: Secondary | ICD-10-CM | POA: Diagnosis present

## 2023-11-24 DIAGNOSIS — I890 Lymphedema, not elsewhere classified: Secondary | ICD-10-CM | POA: Diagnosis present

## 2023-11-24 DIAGNOSIS — Z483 Aftercare following surgery for neoplasm: Secondary | ICD-10-CM

## 2023-11-24 DIAGNOSIS — C50912 Malignant neoplasm of unspecified site of left female breast: Secondary | ICD-10-CM | POA: Diagnosis present

## 2023-11-26 ENCOUNTER — Ambulatory Visit: Admitting: Radiation Oncology

## 2023-11-30 ENCOUNTER — Other Ambulatory Visit: Payer: Self-pay

## 2023-11-30 ENCOUNTER — Ambulatory Visit

## 2023-11-30 ENCOUNTER — Encounter (HOSPITAL_BASED_OUTPATIENT_CLINIC_OR_DEPARTMENT_OTHER): Payer: Self-pay | Admitting: Plastic Surgery

## 2023-11-30 NOTE — H&P (Signed)
 Subjective Patient ID: Briana [name] is a 48 y.o. female.     HPI   5.5 mo post op mastectomy with imemdiate reconstruction. Completed adjuvant radiation 1.13.2025. Returns today for follow up left chest edema. Referred to PT and for compression bra fitting at last visit. PT did not treat due to concerns warmth chest. Scheduled for compression bra fitting today. Called yesterday via PAL to report spontaneous drainage from skin. Denies fever.   Presented with palpable left breast mass. Diagnostic MMG/US showed left UOQ 6.4 x 5.4 cm mass. Distortion posterior to the mass associated with calcifications. Calcifications extend anterior to the mass as well, and there is asymmetrically dense tissue extending from the mass to the base of the nipple. The mass, calcifications and asymmetry span approximately 12 cm. A separate 8 mm group of calcifications in the central mid depth of the left breast. [place] left axilla demonstrated  at least 4 markedly abnormal LN. Biopsies labeled left breast 1 o clock showed triple negative IDC, LN positive for metastatic disease.    MRI showed lobular mass within the lateral/upper outer left breast compatible with biopsy-proven malignancy. NME surrounds mass and extends anterior to the level of the NAC as well as medially to involve the anterior upper inner quadrant of the left breast. In total these findings measure 12.3 x 9.7 cm. Additionally there is a 7 mm oval enhancing mass within the anterior lower inner left breast. Overall disease burden likely involves 3 quadrants of the left breast. Note however that there has only been 1 biopsy of the large mass within the upper-outer left breast. Extensive metastatic left axillary adenopathy present.    PET scan demonstrated hypermetabolic left axillary, left subpectoral and left supraclavicular LN, consistent with nodal metastatic disease; no distant disease noted.   Completed neoadjuvant chemotherapy. Final MRI showed  persistent NME left UOQ  spanning 8.1 x 5.2 x 4.0 cm. Edema in the left lower axilla noted. "Although no enlarged nodes are identified, the axillary regions are not included on this exam."   Final pathology 3.1 cm invasive carcinoma with mixed ductal and lobular features, margins negative, 0/2 LN. On xeloda.   No genetic testing to date.   Prior 40 DD. Left mastectomy 971 g   PMH significant for HIV followed by [facility]. Undetectable viral load since 1996.   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 Lives with teenage daughter. Has 3 other adult children- two in area. Mother also in area to assist with post operative care.    Review of Systems   Objective Physical Exam  Cardiovascular: Normal rate, regular rhythm and normal heart sounds.    Pulmonary/Chest Effort normal and breath sounds normal.    Chest:  Left chest expanded, lymphedema mastectomy flap without cellulitis, hyperpigmentation post RT Dried epidermolysis skin washed Ulceration skin over left  LOQ and through this able to express large amount watery fluid consistent with lymphedema No gross exposure TE, no undermining wound Right chest port   Assessment/Plan Left breast cancer metastasized to lymph node Neoadjuvant chemotherapy S/p left SRM, SLN, prepectoral TE/ADM (Alloderm) reconstruction Adjuvant radiation Lymphedema left chest spontaneous ulceration   Spontaneous drainage/ulceration lymphedema fluid through skin. Symptomatically she feels better after this happened. Counseled there is presently no evidence infection and there is no exposure TE. But we discussed high risk developing given her Dupixent use and RT and foreign body presence. In best case scenario, this area of ulceration will heal on own.  It is also possible it may continue to drain due to lymphedema flaps. Patient states does not want further reconstruction so plan removal TE this week. Reviewed removal TE will not correct  lymphedema and expect high risk wound healing problems given recent RT, Dupixent use, lymphedema. Plan OP surgery, drain placement. Reviewed if she decides to pursue further reconstruction will need some type autologous tissue or flap as part of reconstruction.     Glenna Fellows, MD [facility]  Office/ physician access line after hours [phone]      Natrelle 133S FV-13-T 500 ml tissue expander placed  fill volume 450 ml saline

## 2023-11-30 NOTE — Progress Notes (Signed)
   11/30/23 1542  PAT Phone Screen  Is the patient taking a GLP-1 receptor agonist? No  Do You Have Diabetes? No  Do You Have Hypertension? (S)  Yes  Have You Ever Been to the ER for Asthma? No  Have You Taken Oral Steroids in the Past 3 Months? No  Do you Take Phenteramine or any Other Diet Drugs? No  Recent  Lab Work, EKG, CXR? (S)  Yes  Where was this test performed? (S)  EKG SB 08/02/23  Do you have a history of heart problems? No  Have you ever had tests on your heart? Yes  What cardiac tests were performed? (S)  Echo  What date/year were cardiac tests completed? (S)  Echo 02/10/23 EF 65-70%  Results viewable: [facility] Media Tab  Any Recent Hospitalizations? No  Height 5\' 5"  (1.651 m)  Weight 62.2 kg  Pat Appointment Scheduled (S)  Yes (Has appt 4/2 at [facility]- will come for BMET only if labs aren't done at visit.)

## 2023-12-02 ENCOUNTER — Inpatient Hospital Stay: Payer: No Typology Code available for payment source

## 2023-12-02 ENCOUNTER — Inpatient Hospital Stay: Payer: No Typology Code available for payment source | Attending: Hematology & Oncology

## 2023-12-02 ENCOUNTER — Encounter: Payer: Self-pay | Admitting: Hematology & Oncology

## 2023-12-02 ENCOUNTER — Inpatient Hospital Stay (HOSPITAL_BASED_OUTPATIENT_CLINIC_OR_DEPARTMENT_OTHER): Payer: No Typology Code available for payment source | Admitting: Hematology & Oncology

## 2023-12-02 VITALS — BP 126/90 | HR 92 | Temp 97.7°F | Resp 17 | Wt 140.0 lb

## 2023-12-02 DIAGNOSIS — C778 Secondary and unspecified malignant neoplasm of lymph nodes of multiple regions: Secondary | ICD-10-CM | POA: Insufficient documentation

## 2023-12-02 DIAGNOSIS — C773 Secondary and unspecified malignant neoplasm of axilla and upper limb lymph nodes: Secondary | ICD-10-CM | POA: Diagnosis not present

## 2023-12-02 DIAGNOSIS — Z171 Estrogen receptor negative status [ER-]: Secondary | ICD-10-CM | POA: Diagnosis not present

## 2023-12-02 DIAGNOSIS — Z1722 Progesterone receptor negative status: Secondary | ICD-10-CM | POA: Insufficient documentation

## 2023-12-02 DIAGNOSIS — Z79624 Long term (current) use of inhibitors of nucleotide synthesis: Secondary | ICD-10-CM | POA: Insufficient documentation

## 2023-12-02 DIAGNOSIS — D509 Iron deficiency anemia, unspecified: Secondary | ICD-10-CM | POA: Insufficient documentation

## 2023-12-02 DIAGNOSIS — C50912 Malignant neoplasm of unspecified site of left female breast: Secondary | ICD-10-CM

## 2023-12-02 DIAGNOSIS — Z1732 Human epidermal growth factor receptor 2 negative status: Secondary | ICD-10-CM | POA: Diagnosis not present

## 2023-12-02 DIAGNOSIS — I517 Cardiomegaly: Secondary | ICD-10-CM | POA: Insufficient documentation

## 2023-12-02 DIAGNOSIS — Z79899 Other long term (current) drug therapy: Secondary | ICD-10-CM | POA: Diagnosis not present

## 2023-12-02 DIAGNOSIS — D508 Other iron deficiency anemias: Secondary | ICD-10-CM

## 2023-12-02 DIAGNOSIS — C7951 Secondary malignant neoplasm of bone: Secondary | ICD-10-CM | POA: Diagnosis present

## 2023-12-02 LAB — RETICULOCYTES
Immature Retic Fract: 26.1 % — ABNORMAL HIGH (ref 2.3–15.9)
RBC.: 3.42 MIL/uL — ABNORMAL LOW (ref 3.87–5.11)
Retic Count, Absolute: 54.7 10*3/uL (ref 19.0–186.0)
Retic Ct Pct: 1.6 % (ref 0.4–3.1)

## 2023-12-02 LAB — CMP (CANCER CENTER ONLY)
ALT: 7 U/L (ref 0–44)
AST: 11 U/L — ABNORMAL LOW (ref 15–41)
Albumin: 3.4 g/dL — ABNORMAL LOW (ref 3.5–5.0)
Alkaline Phosphatase: 131 U/L — ABNORMAL HIGH (ref 38–126)
Anion gap: 10 (ref 5–15)
BUN: 15 mg/dL (ref 6–20)
CO2: 21 mmol/L — ABNORMAL LOW (ref 22–32)
Calcium: 9 mg/dL (ref 8.9–10.3)
Chloride: 107 mmol/L (ref 98–111)
Creatinine: 0.61 mg/dL (ref 0.44–1.00)
GFR, Estimated: >60 mL/min (ref >60)
Glucose, Bld: 108 mg/dL — ABNORMAL HIGH (ref 70–99)
Potassium: 3.4 mmol/L — ABNORMAL LOW (ref 3.5–5.1)
Sodium: 138 mmol/L (ref 135–145)
Total Bilirubin: 0.2 mg/dL (ref 0.0–1.2)
Total Protein: 7.9 g/dL (ref 6.5–8.1)

## 2023-12-02 LAB — CBC WITH DIFFERENTIAL (CANCER CENTER ONLY)
Abs Immature Granulocytes: 0.09 10*3/uL — ABNORMAL HIGH (ref 0.00–0.07)
Basophils Absolute: 0 10*3/uL (ref 0.0–0.1)
Basophils Relative: 0 %
Eosinophils Absolute: 0.9 10*3/uL — ABNORMAL HIGH (ref 0.0–0.5)
Eosinophils Relative: 11 %
HCT: 29.8 % — ABNORMAL LOW (ref 36.0–46.0)
Hemoglobin: 9.4 g/dL — ABNORMAL LOW (ref 12.0–15.0)
Immature Granulocytes: 1 %
Lymphocytes Relative: 18 %
Lymphs Abs: 1.3 10*3/uL (ref 0.7–4.0)
MCH: 27.2 pg (ref 26.0–34.0)
MCHC: 31.5 g/dL (ref 30.0–36.0)
MCV: 86.4 fL (ref 80.0–100.0)
Monocytes Absolute: 0.4 10*3/uL (ref 0.1–1.0)
Monocytes Relative: 5 %
Neutro Abs: 4.9 10*3/uL (ref 1.7–7.7)
Neutrophils Relative %: 65 %
Platelet Count: 368 10*3/uL (ref 150–400)
RBC: 3.45 MIL/uL — ABNORMAL LOW (ref 3.87–5.11)
RDW: 17 % — ABNORMAL HIGH (ref 11.5–15.5)
WBC Count: 7.6 10*3/uL (ref 4.0–10.5)
nRBC: 0 % (ref 0.0–0.2)

## 2023-12-02 LAB — IRON AND IRON BINDING CAPACITY (CC-WL,HP ONLY)
Iron: 31 ug/dL (ref 28–170)
Saturation Ratios: 15 % (ref 10.4–31.8)
TIBC: 211 ug/dL — ABNORMAL LOW (ref 250–450)
UIBC: 180 ug/dL (ref 148–442)

## 2023-12-02 LAB — FERRITIN: Ferritin: 408 ng/mL — ABNORMAL HIGH (ref 11–307)

## 2023-12-02 MED ORDER — MEGESTROL ACETATE 625 MG/5ML PO SUSP: 625.0000 mg | Freq: Every day | ORAL | 3 refills | Status: DC

## 2023-12-02 MED ORDER — SODIUM CHLORIDE 0.9% FLUSH
10.0000 mL | Freq: Once | INTRAVENOUS | Status: AC
  Administered 2023-12-02: 10 mL via INTRAVENOUS

## 2023-12-02 MED ORDER — HEPARIN SOD (PORK) LOCK FLUSH 100 UNIT/ML IV SOLN: 500.0000 [IU] | Freq: Once | INTRAVENOUS | Status: DC

## 2023-12-02 MED ORDER — SODIUM CHLORIDE 0.9% FLUSH: 10.0000 mL | INTRAVENOUS | Status: DC | PRN

## 2023-12-02 MED ORDER — HEPARIN SOD (PORK) LOCK FLUSH 100 UNIT/ML IV SOLN
500.0000 [IU] | Freq: Once | INTRAVENOUS | Status: AC
  Administered 2023-12-02: 500 [IU] via INTRAVENOUS

## 2023-12-02 NOTE — Patient Instructions (Signed)

## 2023-12-02 NOTE — Progress Notes (Signed)
 Is here Hematology and Oncology Follow Up Visit  Briana [name] [id number] May 10, 1976 47 y.o. 12/02/2023   Principle Diagnosis:  Stage IIIC (T4N3M0) - TRIPLE NEGATIVE - infiltrating ductal carcinoma of the left breast HIV (+)  Current Therapy:   Neoadjuvant  chemoimmunotherapy --- carboplatin/Taxol/pembrolizumab ---s/p cycle #3 - start on 02/19/2023 S/p LEFT MRM - 06/08/2023 --  Residual breast cancer - 3/3 nodes (-) XRT -- Start on 07/22/2023 --completed on 09/15/2023 Xeloda - s/p cycle #3 on 09/28/2023     Interim History:  Briana [name] is back for follow-up.  She is tolerating the Xeloda quite nicely.  Unfortunately, the problem is her left breast.  She has an implant.  Unfortunately, the implants can have to come out.  She has a lot of edema and swelling and discharge from the left breast.  This is going to happen on Friday in which she will have the implant removed.  Overall, she is doing nicely on the Xeloda.  She really has had no problems with her hands or feet.  She is still working.  She works at [employer].  She has had no fever.  She has had no bleeding.  Has been no problems with bowels or bladder.  She has had no leg swelling.  There has been little bit of swelling in the left leg.  We have checked this for blood clot via Doppler and so far, this has been negative.  Currently, I would have said that her performance status is probably ECOG 1.    Medications:  Current Outpatient Medications:    amLODipine (NORVASC) 10 MG tablet, TAKE 1 TABLET(10 MG) BY MOUTH DAILY, Disp: 90 tablet, Rfl: 0   bictegravir-emtricitabine-tenofovir AF (BIKTARVY) 50-200-25 MG TABS tablet, TAKE 1 TABLET DAILY, Disp: 30 tablet, Rfl: 11   capecitabine (XELODA) 500 MG tablet, Take 4 tablets (2,000 mg total) by mouth 2 (two) times daily after a meal. Take within 30 minutes after meals. Take for 14 days on, then off for 7 days. Repeat every 21 days., Disp: 112 tablet, Rfl: 4   DUPIXENT 300  MG/2ML SOAJ, Inject into the skin. Every 14 days., Disp: , Rfl:    ferrous sulfate 325 (65 FE) MG EC tablet, Take 325 mg by mouth daily with breakfast., Disp: , Rfl:    hydrochlorothiazide (HYDRODIURIL) 12.5 MG tablet, Take 1 tablet (12.5 mg total) by mouth daily., Disp: 90 tablet, Rfl: 3   potassium chloride SA (KLOR-CON M) 20 MEQ tablet, Take 2 tablets (40 mEq total) by mouth 2 (two) times daily., Disp: 60 tablet, Rfl: 0   triamcinolone (KENALOG) 0.025 % ointment, Apply topically 2 (two) times daily as needed., Disp: 454 g, Rfl: 5   HYDROcodone-acetaminophen (NORCO) 5-325 MG tablet, Take 1 tablet by mouth every 4 (four) hours as needed for severe pain (pain score 7-10). (Patient not taking: Reported on 12/02/2023), Disp: 10 tablet, Rfl: 0  Allergies:  Allergies  Allergen Reactions   Pumpkin Flavoring Agent (Non-Screening) Swelling    ONLY Lip swelling after eating pumpkin pie    Past Medical History, Surgical history, Social history, and Family History were reviewed and updated.  Review of Systems: Review of Systems  Constitutional: Negative.   HENT:  Negative.    Eyes: Negative.   Respiratory: Negative.    Cardiovascular: Negative.   Gastrointestinal: Negative.   Endocrine: Negative.   Genitourinary: Negative.    Musculoskeletal: Negative.   Skin: Negative.   Neurological: Negative.   Hematological: Negative.   Psychiatric/Behavioral:  Negative.      Physical Exam: Temperature is 97.7.  Pulse 72.  Blood pressure is 126/90.  Weight is 140 pounds.    Wt Readings from Last 3 Encounters:  11/30/23 137 lb 2 oz (62.2 kg)  12/02/23 140 lb (63.5 kg)  11/05/23 141 lb 9.6 oz (64.2 kg)    Physical Exam Vitals reviewed.  Constitutional:      Comments: Right breast shows no masses edema or erythema.  There is no right axillary adenopathy.  Left breast really does not show any masses.  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  There is some slight skin breakdown.   There is no exudate.  I cannot palpate any distinct mass in the left breast.   I cannot palpate any left axillary lymph nodes.    HENT:     Head: Normocephalic and atraumatic.  Eyes:     Pupils: Pupils are equal, round, and reactive to light.  Cardiovascular:     Rate and Rhythm: Normal rate and regular rhythm.     Heart sounds: Normal heart sounds.  Pulmonary:     Effort: Pulmonary effort is normal.     Breath sounds: Normal breath sounds.  Abdominal:     General: Bowel sounds are normal.     Palpations: Abdomen is soft.  Musculoskeletal:        General: No tenderness or deformity. Normal range of motion.     Cervical back: Normal range of motion.  Lymphadenopathy:     Cervical: No cervical adenopathy.  Skin:    General: Skin is warm and dry.     Findings: No erythema or rash.  Neurological:     Mental Status: She is alert and oriented to person, place, and time.  Psychiatric:        Behavior: Behavior normal.        Thought Content: Thought content normal.        Judgment: Judgment normal.     Lab Results  Component Value Date   WBC 7.6 12/02/2023   HGB 9.4 (L) 12/02/2023   HCT 29.8 (L) 12/02/2023   MCV 86.4 12/02/2023   PLT 368 12/02/2023     Chemistry      Component Value Date/Time   NA 138 12/02/2023 0818   K 3.4 (L) 12/02/2023 0818   CL 107 12/02/2023 0818   CO2 21 (L) 12/02/2023 0818   BUN 15 12/02/2023 0818   CREATININE 0.61 12/02/2023 0818   CREATININE 0.62 10/17/2022 0947      Component Value Date/Time   CALCIUM 9.0 12/02/2023 0818   ALKPHOS 131 (H) 12/02/2023 0818   AST 11 (L) 12/02/2023 0818   ALT 7 12/02/2023 0818   BILITOT 0.2 12/02/2023 0818     Impression and Plan: Briana [name] is a very nice 48 year old premenopausal Afro-American female with TRIPLE NEGATIVE breast cancer.  She has aggressive disease in my opinion.  I am surprised by the weight loss.  Again, she has been through quite a bit.  I know this was a tough protocol for her.  I know  she is on the Xeloda right now.  I think she is tolerating the Xeloda fairly well.  I told her to stop the is little right now.  I do not want this to be "on board" when she does have surgery on Friday.  I want her to be able to heal up.  I do not see a problem with her stopping  the Xeloda little bit early this cycle.  I would like to plan to get her back to see [place] in another 6 weeks.  We will have to watch her weight closely.   Josph Macho, MD 4/2/20259:30 AM

## 2023-12-03 ENCOUNTER — Ambulatory Visit

## 2023-12-03 ENCOUNTER — Encounter: Payer: Self-pay | Admitting: Family

## 2023-12-03 NOTE — Progress Notes (Signed)

## 2023-12-04 ENCOUNTER — Ambulatory Visit (HOSPITAL_BASED_OUTPATIENT_CLINIC_OR_DEPARTMENT_OTHER)
Admission: RE | Admit: 2023-12-04 | Discharge: 2023-12-04 | Disposition: A | Discharge status: Home / Self Care | Attending: Plastic Surgery | Admitting: Plastic Surgery

## 2023-12-04 ENCOUNTER — Other Ambulatory Visit: Payer: Self-pay

## 2023-12-04 ENCOUNTER — Encounter (HOSPITAL_BASED_OUTPATIENT_CLINIC_OR_DEPARTMENT_OTHER)
Admission: RE | Disposition: A | Discharge status: Home / Self Care | Payer: Self-pay | Source: Home / Self Care | Attending: Plastic Surgery

## 2023-12-04 ENCOUNTER — Ambulatory Visit (HOSPITAL_BASED_OUTPATIENT_CLINIC_OR_DEPARTMENT_OTHER): Admitting: Anesthesiology

## 2023-12-04 ENCOUNTER — Encounter (HOSPITAL_BASED_OUTPATIENT_CLINIC_OR_DEPARTMENT_OTHER): Payer: Self-pay | Admitting: Plastic Surgery

## 2023-12-04 DIAGNOSIS — Z79899 Other long term (current) drug therapy: Secondary | ICD-10-CM | POA: Diagnosis not present

## 2023-12-04 DIAGNOSIS — I890 Lymphedema, not elsewhere classified: Secondary | ICD-10-CM | POA: Diagnosis not present

## 2023-12-04 DIAGNOSIS — Z853 Personal history of malignant neoplasm of breast: Secondary | ICD-10-CM | POA: Diagnosis present

## 2023-12-04 DIAGNOSIS — I10 Essential (primary) hypertension: Secondary | ICD-10-CM | POA: Diagnosis not present

## 2023-12-04 DIAGNOSIS — Z9221 Personal history of antineoplastic chemotherapy: Secondary | ICD-10-CM | POA: Insufficient documentation

## 2023-12-04 DIAGNOSIS — Z21 Asymptomatic human immunodeficiency virus [HIV] infection status: Secondary | ICD-10-CM | POA: Diagnosis not present

## 2023-12-04 DIAGNOSIS — L987 Excessive and redundant skin and subcutaneous tissue: Secondary | ICD-10-CM | POA: Insufficient documentation

## 2023-12-04 DIAGNOSIS — Z923 Personal history of irradiation: Secondary | ICD-10-CM | POA: Insufficient documentation

## 2023-12-04 DIAGNOSIS — Z9012 Acquired absence of left breast and nipple: Secondary | ICD-10-CM | POA: Insufficient documentation

## 2023-12-04 DIAGNOSIS — Z01818 Encounter for other preprocedural examination: Secondary | ICD-10-CM

## 2023-12-04 HISTORY — PX: REMOVAL OF TISSUE EXPANDER: SHX6324

## 2023-12-04 HISTORY — DX: Essential (primary) hypertension: I10

## 2023-12-04 HISTORY — PX: CAPSULECTOMY: SHX5381

## 2023-12-04 LAB — POCT PREGNANCY, URINE: Preg Test, Ur: NEGATIVE

## 2023-12-04 LAB — ERYTHROPOIETIN: Erythropoietin: 33.2 m[IU]/mL — ABNORMAL HIGH (ref 2.6–18.5)

## 2023-12-04 SURGERY — REMOVAL, TISSUE EXPANDER
Anesthesia: General | Site: Chest | Laterality: Left

## 2023-12-04 MED ORDER — ACETAMINOPHEN 500 MG PO TABS
ORAL_TABLET | ORAL | Status: AC
  Filled 2023-12-04: qty 2

## 2023-12-04 MED ORDER — HYDROMORPHONE HCL 1 MG/ML IJ SOLN
INTRAMUSCULAR | Status: AC
  Filled 2023-12-04: qty 0.5

## 2023-12-04 MED ORDER — LACTATED RINGERS IV SOLN: INTRAVENOUS | Status: DC

## 2023-12-04 MED ORDER — SODIUM CHLORIDE 0.9 % IV SOLN
INTRAVENOUS | Status: DC | PRN
  Administered 2023-12-04: 500 mL

## 2023-12-04 MED ORDER — ONDANSETRON HCL 4 MG/2ML IJ SOLN: 4.0000 mg | Freq: Once | INTRAMUSCULAR | Status: DC | PRN

## 2023-12-04 MED ORDER — GABAPENTIN 300 MG PO CAPS
300.0000 mg | ORAL_CAPSULE | ORAL | Status: AC
  Administered 2023-12-04: 300 mg via ORAL

## 2023-12-04 MED ORDER — TRANEXAMIC ACID 1000 MG/10ML IV SOLN
Status: DC | PRN
  Administered 2023-12-04: 3000 mg via TOPICAL

## 2023-12-04 MED ORDER — EPHEDRINE 5 MG/ML INJ
INTRAVENOUS | Status: AC
  Filled 2023-12-04: qty 5

## 2023-12-04 MED ORDER — 0.9 % SODIUM CHLORIDE (POUR BTL) OPTIME
TOPICAL | Status: DC | PRN
  Administered 2023-12-04: 500 mL

## 2023-12-04 MED ORDER — ACETAMINOPHEN 500 MG PO TABS
1000.0000 mg | ORAL_TABLET | ORAL | Status: AC
  Administered 2023-12-04: 1000 mg via ORAL

## 2023-12-04 MED ORDER — PROPOFOL 10 MG/ML IV BOLUS
INTRAVENOUS | Status: DC | PRN
  Administered 2023-12-04: 200 mg via INTRAVENOUS

## 2023-12-04 MED ORDER — CEFAZOLIN SODIUM-DEXTROSE 2-4 GM/100ML-% IV SOLN
2.0000 g | INTRAVENOUS | Status: AC
  Administered 2023-12-04: 2 g via INTRAVENOUS

## 2023-12-04 MED ORDER — DEXAMETHASONE SODIUM PHOSPHATE 10 MG/ML IJ SOLN
INTRAMUSCULAR | Status: AC
  Filled 2023-12-04: qty 2

## 2023-12-04 MED ORDER — FENTANYL CITRATE (PF) 100 MCG/2ML IJ SOLN
INTRAMUSCULAR | Status: AC
  Filled 2023-12-04: qty 2

## 2023-12-04 MED ORDER — POVIDONE-IODINE 10 % EX SOLN
CUTANEOUS | Status: DC | PRN
  Administered 2023-12-04: 1 via TOPICAL

## 2023-12-04 MED ORDER — DEXAMETHASONE SODIUM PHOSPHATE 10 MG/ML IJ SOLN
INTRAMUSCULAR | Status: DC | PRN
  Administered 2023-12-04: 10 mg via INTRAVENOUS

## 2023-12-04 MED ORDER — ONDANSETRON HCL 4 MG/2ML IJ SOLN
INTRAMUSCULAR | Status: AC
  Filled 2023-12-04: qty 6

## 2023-12-04 MED ORDER — KETOROLAC TROMETHAMINE 30 MG/ML IJ SOLN: 30.0000 mg | Freq: Once | INTRAMUSCULAR | Status: DC | PRN

## 2023-12-04 MED ORDER — METHOCARBAMOL 500 MG PO TABS: 500.0000 mg | ORAL_TABLET | Freq: Three times a day (TID) | ORAL | 0 refills | Status: AC | PRN

## 2023-12-04 MED ORDER — PROPOFOL 10 MG/ML IV BOLUS
INTRAVENOUS | Status: AC
  Filled 2023-12-04: qty 20

## 2023-12-04 MED ORDER — ACETAMINOPHEN 500 MG PO TABS: 1000.0000 mg | ORAL_TABLET | Freq: Once | ORAL | Status: AC

## 2023-12-04 MED ORDER — DEXMEDETOMIDINE HCL IN NACL 80 MCG/20ML IV SOLN
INTRAVENOUS | Status: DC | PRN
  Administered 2023-12-04 (×5): 4 ug via INTRAVENOUS

## 2023-12-04 MED ORDER — CELECOXIB 200 MG PO CAPS
ORAL_CAPSULE | ORAL | Status: AC
  Filled 2023-12-04: qty 1

## 2023-12-04 MED ORDER — MIDAZOLAM HCL 5 MG/5ML IJ SOLN
INTRAMUSCULAR | Status: DC | PRN
Start: 2023-12-04 — End: 2023-12-04
  Administered 2023-12-04: 2 mg via INTRAVENOUS

## 2023-12-04 MED ORDER — OXYCODONE HCL 5 MG/5ML PO SOLN: 5.0000 mg | Freq: Once | ORAL | Status: AC | PRN

## 2023-12-04 MED ORDER — LIDOCAINE 2% (20 MG/ML) 5 ML SYRINGE
INTRAMUSCULAR | Status: AC
  Filled 2023-12-04: qty 10

## 2023-12-04 MED ORDER — LIDOCAINE 2% (20 MG/ML) 5 ML SYRINGE
INTRAMUSCULAR | Status: DC | PRN
  Administered 2023-12-04: 60 mg via INTRAVENOUS

## 2023-12-04 MED ORDER — FENTANYL CITRATE (PF) 100 MCG/2ML IJ SOLN
INTRAMUSCULAR | Status: DC | PRN
  Administered 2023-12-04 (×7): 25 ug via INTRAVENOUS
  Administered 2023-12-04: 50 ug via INTRAVENOUS
  Administered 2023-12-04 (×3): 25 ug via INTRAVENOUS

## 2023-12-04 MED ORDER — BUPIVACAINE HCL (PF) 0.5 % IJ SOLN
INTRAMUSCULAR | Status: DC | PRN
  Administered 2023-12-04: 30 mL

## 2023-12-04 MED ORDER — OXYCODONE HCL 5 MG PO TABS
ORAL_TABLET | ORAL | Status: AC
Start: 2023-12-04 — End: ?
  Filled 2023-12-04: qty 1

## 2023-12-04 MED ORDER — SULFAMETHOXAZOLE-TRIMETHOPRIM 800-160 MG PO TABS: 1.0000 | ORAL_TABLET | Freq: Two times a day (BID) | ORAL | 0 refills | Status: DC

## 2023-12-04 MED ORDER — HYDROMORPHONE HCL 1 MG/ML IJ SOLN
0.2500 mg | INTRAMUSCULAR | Status: DC | PRN
  Administered 2023-12-04: 0.5 mg via INTRAVENOUS

## 2023-12-04 MED ORDER — HYDROCODONE-ACETAMINOPHEN 5-325 MG PO TABS
1.0000 | ORAL_TABLET | ORAL | 0 refills | Status: DC | PRN
Start: 2023-12-04 — End: 2023-12-20

## 2023-12-04 MED ORDER — TRANEXAMIC ACID 1000 MG/10ML IV SOLN
INTRAVENOUS | Status: AC
  Filled 2023-12-04: qty 30

## 2023-12-04 MED ORDER — MEPERIDINE HCL 25 MG/ML IJ SOLN: 6.2500 mg | INTRAMUSCULAR | Status: DC | PRN

## 2023-12-04 MED ORDER — ONDANSETRON HCL 4 MG/2ML IJ SOLN
INTRAMUSCULAR | Status: DC | PRN
  Administered 2023-12-04: 4 mg via INTRAVENOUS

## 2023-12-04 MED ORDER — PROPOFOL 500 MG/50ML IV EMUL
INTRAVENOUS | Status: AC
  Filled 2023-12-04: qty 50

## 2023-12-04 MED ORDER — OXYCODONE HCL 5 MG PO TABS
5.0000 mg | ORAL_TABLET | Freq: Once | ORAL | Status: AC | PRN
  Administered 2023-12-04: 5 mg via ORAL

## 2023-12-04 MED ORDER — MIDAZOLAM HCL 2 MG/2ML IJ SOLN
INTRAMUSCULAR | Status: AC
  Filled 2023-12-04: qty 2

## 2023-12-04 MED ORDER — CEFAZOLIN SODIUM-DEXTROSE 2-4 GM/100ML-% IV SOLN
INTRAVENOUS | Status: AC
  Filled 2023-12-04: qty 100

## 2023-12-04 MED ORDER — PHENYLEPHRINE 80 MCG/ML (10ML) SYRINGE FOR IV PUSH (FOR BLOOD PRESSURE SUPPORT)
PREFILLED_SYRINGE | INTRAVENOUS | Status: DC | PRN
  Administered 2023-12-04 (×2): 80 ug via INTRAVENOUS
  Administered 2023-12-04: 160 ug via INTRAVENOUS
  Administered 2023-12-04: 80 ug via INTRAVENOUS
  Administered 2023-12-04: 160 ug via INTRAVENOUS
  Administered 2023-12-04 (×3): 80 ug via INTRAVENOUS

## 2023-12-04 MED ORDER — CELECOXIB 200 MG PO CAPS
200.0000 mg | ORAL_CAPSULE | ORAL | Status: AC
  Administered 2023-12-04: 200 mg via ORAL

## 2023-12-04 MED ORDER — GABAPENTIN 300 MG PO CAPS
ORAL_CAPSULE | ORAL | Status: AC
  Filled 2023-12-04: qty 1

## 2023-12-04 MED ORDER — PHENYLEPHRINE 80 MCG/ML (10ML) SYRINGE FOR IV PUSH (FOR BLOOD PRESSURE SUPPORT)
PREFILLED_SYRINGE | INTRAVENOUS | Status: AC
  Filled 2023-12-04: qty 10

## 2023-12-04 MED ORDER — AMISULPRIDE (ANTIEMETIC) 5 MG/2ML IV SOLN: 10.0000 mg | Freq: Once | INTRAVENOUS | Status: DC | PRN

## 2023-12-04 MED ORDER — SODIUM CHLORIDE 0.9 % IV SOLN
INTRAVENOUS | Status: AC
  Filled 2023-12-04: qty 10

## 2023-12-04 SURGICAL SUPPLY — 65 items
BAG DECANTER FOR FLEXI CONT (MISCELLANEOUS) ×2 IMPLANT
BINDER BREAST LRG (GAUZE/BANDAGES/DRESSINGS) IMPLANT
BINDER BREAST MEDIUM (GAUZE/BANDAGES/DRESSINGS) IMPLANT
BINDER BREAST XLRG (GAUZE/BANDAGES/DRESSINGS) IMPLANT
BINDER BREAST XXLRG (GAUZE/BANDAGES/DRESSINGS) IMPLANT
BLADE SURG 10 STRL SS (BLADE) ×2 IMPLANT
BLADE SURG 15 STRL LF DISP TIS (BLADE) IMPLANT
BNDG GAUZE DERMACEA FLUFF 4 (GAUZE/BANDAGES/DRESSINGS) IMPLANT
CANISTER SUCT 1200ML W/VALVE (MISCELLANEOUS) ×2 IMPLANT
CHLORAPREP W/TINT 26 (MISCELLANEOUS) ×2 IMPLANT
COVER BACK TABLE 60X90IN (DRAPES) ×2 IMPLANT
COVER MAYO STAND STRL (DRAPES) ×2 IMPLANT
DERMABOND ADVANCED .7 DNX12 (GAUZE/BANDAGES/DRESSINGS) ×2 IMPLANT
DRAIN CHANNEL 15F RND FF W/TCR (WOUND CARE) IMPLANT
DRAIN CHANNEL 19F RND (DRAIN) IMPLANT
DRAPE INCISE IOBAN 66X45 STRL (DRAPES) IMPLANT
DRAPE TOP ARMCOVERS (MISCELLANEOUS) ×2 IMPLANT
DRAPE U-SHAPE 76X120 STRL (DRAPES) ×2 IMPLANT
DRAPE UTILITY XL STRL (DRAPES) ×2 IMPLANT
DRSG ADAPTIC 3X8 NADH LF (GAUZE/BANDAGES/DRESSINGS) IMPLANT
ELECT BLADE 4.0 EZ CLEAN MEGAD (MISCELLANEOUS) IMPLANT
ELECT BLADE 6.5 EXT (BLADE) IMPLANT
ELECT COATED BLADE 2.86 ST (ELECTRODE) ×2 IMPLANT
ELECT REM PT RETURN 9FT ADLT (ELECTROSURGICAL) ×2 IMPLANT
ELECTRODE BLDE 4.0 EZ CLN MEGD (MISCELLANEOUS) IMPLANT
ELECTRODE REM PT RTRN 9FT ADLT (ELECTROSURGICAL) ×2 IMPLANT
EVACUATOR SILICONE 100CC (DRAIN) IMPLANT
GAUZE PAD ABD 8X10 STRL (GAUZE/BANDAGES/DRESSINGS) ×2 IMPLANT
GAUZE SPONGE 4X4 12PLY STRL (GAUZE/BANDAGES/DRESSINGS) IMPLANT
GAUZE SPONGE 4X4 12PLY STRL LF (GAUZE/BANDAGES/DRESSINGS) IMPLANT
GLOVE BIO SURGEON STRL SZ 6 (GLOVE) ×2 IMPLANT
GLOVE BIOGEL PI IND STRL 7.0 (GLOVE) IMPLANT
GLOVE BIOGEL PI IND STRL 7.5 (GLOVE) IMPLANT
GLOVE SURG SS PI 7.0 STRL IVOR (GLOVE) IMPLANT
GOWN STRL REUS W/ TWL LRG LVL3 (GOWN DISPOSABLE) ×4 IMPLANT
MARKER SKIN DUAL TIP RULER LAB (MISCELLANEOUS) IMPLANT
NDL HYPO 25X1 1.5 SAFETY (NEEDLE) IMPLANT
NEEDLE HYPO 25X1 1.5 SAFETY (NEEDLE) ×2 IMPLANT
NS IRRIG 1000ML POUR BTL (IV SOLUTION) IMPLANT
PACK BASIN DAY SURGERY FS (CUSTOM PROCEDURE TRAY) ×2 IMPLANT
PENCIL SMOKE EVACUATOR (MISCELLANEOUS) ×2 IMPLANT
PIN SAFETY STERILE (MISCELLANEOUS) IMPLANT
SHEET MEDIUM DRAPE 40X70 STRL (DRAPES) ×2 IMPLANT
SLEEVE SCD COMPRESS KNEE MED (STOCKING) ×2 IMPLANT
SPIKE FLUID TRANSFER (MISCELLANEOUS) IMPLANT
SPONGE T-LAP 18X18 ~~LOC~~+RFID (SPONGE) ×4 IMPLANT
STAPLER SKIN PROX WIDE 3.9 (STAPLE) ×2 IMPLANT
STRIP CLOSURE SKIN 1/2X4 (GAUZE/BANDAGES/DRESSINGS) IMPLANT
SUT ETHILON 2 0 FS 18 (SUTURE) IMPLANT
SUT MNCRL AB 4-0 PS2 18 (SUTURE) ×2 IMPLANT
SUT PDS AB 2-0 CT2 27 (SUTURE) IMPLANT
SUT PROLENE 2 0 CT2 30 (SUTURE) IMPLANT
SUT PROLENE 3 0 PS 2 (SUTURE) IMPLANT
SUT VIC AB 3-0 PS1 18XBRD (SUTURE) IMPLANT
SUT VIC AB 3-0 SH 27X BRD (SUTURE) IMPLANT
SUT VIC AB 4-0 PS2 18 (SUTURE) ×2 IMPLANT
SWAB COLLECTION DEVICE MRSA (MISCELLANEOUS) IMPLANT
SWAB CULTURE ESWAB REG 1ML (MISCELLANEOUS) IMPLANT
SYR 50ML LL SCALE MARK (SYRINGE) IMPLANT
SYR BULB IRRIG 60ML STRL (SYRINGE) ×2 IMPLANT
SYR CONTROL 10ML LL (SYRINGE) IMPLANT
TOWEL GREEN STERILE FF (TOWEL DISPOSABLE) ×4 IMPLANT
TUBE CONNECTING 20X1/4 (TUBING) ×2 IMPLANT
UNDERPAD 30X36 HEAVY ABSORB (UNDERPADS AND DIAPERS) ×4 IMPLANT
YANKAUER SUCT BULB TIP NO VENT (SUCTIONS) ×2 IMPLANT

## 2023-12-04 NOTE — Anesthesia Postprocedure Evaluation (Signed)
 Anesthesia Post Note  Patient: JELESA MANGINI  Procedure(s) Performed: REMOVAL, TISSUE EXPANDER (Left: Chest) COMPLETE BREAST CAPSULECTOMY (Left: Breast)     Patient location during evaluation: PACU Anesthesia Type: General Level of consciousness: awake and alert Pain management: pain level controlled Vital Signs Assessment: post-procedure vital signs reviewed and stable Respiratory status: spontaneous breathing, nonlabored ventilation, respiratory function stable and patient connected to nasal cannula oxygen Cardiovascular status: blood pressure returned to baseline and stable Postop Assessment: no apparent nausea or vomiting Anesthetic complications: no  No notable events documented.  Last Vitals:  Vitals:   12/04/23 1415 12/04/23 1449  BP: (!) 112/91 (!) 123/91  Pulse: 85 78  Resp: (!) 21 16  Temp:  (!) 36.3 C  SpO2: 92% 94%    Last Pain:  Vitals:   12/04/23 1504  TempSrc:   PainSc: 2                  Kennieth Rad

## 2023-12-04 NOTE — Op Note (Signed)
 Operative Note   DATE OF OPERATION: 4.4.2025  LOCATION: [facility]-outpatient  SURGICAL DIVISION: Plastic Surgery  PREOPERATIVE DIAGNOSES:  1. History left breast cancer 2. Acquired absence left breast 3. History therapeutic radiation 4. Lymphedema left chest  POSTOPERATIVE DIAGNOSES:  1. History left breast cancer 2. Acquired absence left breast 3. History therapeutic radiation 4. Lymphedema left chest 5. Mechanical complication left chest tissue expander 6. Seroma left chest  PROCEDURE:  1. Removal left chest tissue expander 2. Complete capsulectomy left chest 3. Complex repair left chest 20 cm  SURGEON: Glenna Fellows MD MBA  ASSISTANT: none  ANESTHESIA:  General.   EBL: 100 ml  COMPLICATIONS: None immediate.   INDICATIONS FOR PROCEDURE:  The patient, Briana [name], is a 48 y.o. female born on 09-30-1975, is here for removal left chest tissue expander. Patient has developed significant lymphedema left chest mastectomy flap and developed ulceration through edematous skin. Patient desires no further reconstruction attempts. Plan removal expander and redundant mastectomy flap skin.   FINDINGS: Significant induration soft tissue consistent with lymphedema and recent completion radiation therapy. Area of skin ulceration over left lower outer chest did not communicated with expander. Upon entering implant capsule cloudy fluid encountered and expander noted to be deflated.   DESCRIPTION OF PROCEDURE:  The patient's operative site was marked with the patient in the preoperative area. The patient was taken to the operating room. SCDs were placed and IV antibiotics were given. The patient's operative site was prepped and draped in a sterile fashion. A time out was performed and all information was confirmed to be correct. Incision made in left inframammary fold scar and carried through soft tissue to implant capsule. Capsule entered and cloudy fluid encountered. Cultures obtained.  Expander noted to be deflated. Expander removed. Soft tissue was very indurated. Complete capsulectomy performed to aid with closure. Redundant mastectomy flap excised via elliptical excision lower pole tissue. This included resection the area of spontaneous skin ulceration and included all of prior Wise pattern mastectomy scar. Cavity irrigated with saline solution containing Ancef, gentamicin, and Betadine.TXA soaked sponge placed in cavity. Hemostasis obtained. 19 Fr JP placed percutaneously and secured to skin with 2-0 nylon. Local anesthetic infiltrated. Closure completed with interrupted 2-0 PDS in superficial fascia and dermis. Skin closure completed with short running and interrupted 3-0 prolene and 2-0 prolene. Length closure 20 cm. Adaptic and dry dressing applied followed by breast binder.  The patient was allowed to wake from anesthesia, extubated and taken to the recovery room in satisfactory condition.   SPECIMENS: 1. Left chest culture 2. Left mastectomy flap  DRAINS: 19 Fr JP in subcutaneous left chest  Glenna Fellows, MD [facility] Plastic & Reconstructive Surgery  Office/ physician access line after hours [phone]

## 2023-12-04 NOTE — Interval H&P Note (Signed)
 History and Physical Interval Note:  12/04/2023 9:36 AM  Briana [name]  has presented today for surgery, with the diagnosis of HISTORY BREAST CANCER ACQUIRED ABSENCE BREAST HISTORY THERAPEUTIC RADIATION LYMPHEDEMA CHEST.  The various methods of treatment have been discussed with the patient and family. After consideration of risks, benefits and other options for treatment, the patient has consented to  Procedure(s): REMOVAL, TISSUE EXPANDER (Left) as a surgical intervention.  The patient's history has been reviewed, patient examined, no change in status, stable for surgery.  I have reviewed the patient's chart and labs.  Questions were answered to the patient's satisfaction.     Irean Hong Daquawn Seelman

## 2023-12-04 NOTE — Discharge Instructions (Addendum)
  Post Anesthesia Home Care Instructions  Activity: Get plenty of rest for the remainder of the day. A responsible individual must stay with you for 24 hours following the procedure.  For the next 24 hours, DO NOT: -Drive a car -Advertising copywriter -Drink alcoholic beverages -Take any medication unless instructed by your physician -Make any legal decisions or sign important papers.  Meals: Start with liquid foods such as gelatin or soup. Progress to regular foods as tolerated. Avoid greasy, spicy, heavy foods. If nausea and/or vomiting occur, drink only clear liquids until the nausea and/or vomiting subsides. Call your physician if vomiting continues.  Special Instructions/Symptoms: Your throat may feel dry or sore from the anesthesia or the breathing tube placed in your throat during surgery. If this causes discomfort, gargle with warm salt water. The discomfort should disappear within 24 hours.  May have Tylenol after 4:30pm if needed. No Ibuprofen/NSAIDS before 6pm, if needed.    About my Jackson-Pratt Bulb Drain  What is a Jackson-Pratt bulb? A Jackson-Pratt is a soft, round device used to collect drainage. It is connected to a long, thin drainage catheter, which is held in place by one or two small stiches near your surgical incision site. When the bulb is squeezed, it forms a vacuum, forcing the drainage to empty into the bulb.  Emptying the Jackson-Pratt bulb- To empty the bulb: 1. Release the plug on the top of the bulb. 2. Pour the bulb's contents into a measuring container which your nurse will provide. 3. Record the time emptied and amount of drainage. Empty the drain(s) as often as your     doctor or nurse recommends.  Date                  Time                    Amount (Drain 1)                 Amount (Drain  2)  _____________________________________________________________________  _____________________________________________________________________  _____________________________________________________________________  _____________________________________________________________________  _____________________________________________________________________  _____________________________________________________________________  _____________________________________________________________________  _____________________________________________________________________  Squeezing the Jackson-Pratt Bulb- To squeeze the bulb: 1. Make sure the plug at the top of the bulb is open. 2. Squeeze the bulb tightly in your fist. You will hear air squeezing from the bulb. 3. Replace the plug while the bulb is squeezed. 4. Use a safety pin to attach the bulb to your clothing. This will keep the catheter from     pulling at the bulb insertion site.  When to call your doctor- Call your doctor if: Drain site becomes red, swollen or hot. You have a fever greater than 101 degrees F. There is oozing at the drain site. Drain falls out (apply a guaze bandage over the drain hole and secure it with tape). Drainage increases daily not related to activity patterns. (You will usually have more drainage when you are active than when you are resting.) Drainage has a bad odor.

## 2023-12-04 NOTE — Transfer of Care (Signed)
 Immediate Anesthesia Transfer of Care Note  Patient: Briana [name]  Procedure(s) Performed: REMOVAL, TISSUE EXPANDER (Left: Chest) COMPLETE BREAST CAPSULECTOMY (Left: Breast)  Patient Location: PACU  Anesthesia Type:General  Level of Consciousness: drowsy and patient cooperative  Airway & Oxygen Therapy: Patient Spontanous Breathing and Patient connected to face mask oxygen  Post-op Assessment: Report given to RN and Post -op Vital signs reviewed and stable  Post vital signs: Reviewed and stable  Last Vitals:  Vitals Value Taken Time  BP 106/83 12/04/23 1338  Temp    Pulse 97 12/04/23 1341  Resp 18 12/04/23 1341  SpO2 91 % 12/04/23 1341  Vitals shown include unfiled device data.  Last Pain:  Vitals:   12/04/23 1012  TempSrc: Temporal  PainSc: 0-No pain      Patients Stated Pain Goal: 3 (12/04/23 1012)  Complications: No notable events documented.

## 2023-12-04 NOTE — Anesthesia Preprocedure Evaluation (Addendum)
 Anesthesia Evaluation  Patient identified by MRN, date of birth, ID band Patient awake    Reviewed: Allergy & Precautions, NPO status , Patient's Chart, lab work & pertinent test results  Airway Mallampati: I  TM Distance: >3 FB Neck ROM: Full    Dental  (+) Teeth Intact, Dental Advisory Given   Pulmonary neg pulmonary ROS   Pulmonary exam normal breath sounds clear to auscultation       Cardiovascular hypertension (121/89 preop), Pt. on medications Normal cardiovascular exam Rhythm:Regular Rate:Normal     Neuro/Psych negative neurological ROS  negative psych ROS   GI/Hepatic negative GI ROS, Neg liver ROS,,,  Endo/Other  negative endocrine ROS    Renal/GU negative Renal ROS  negative genitourinary   Musculoskeletal negative musculoskeletal ROS (+)    Abdominal   Peds  Hematology  (+) Blood dyscrasia, anemia , HIVHb 9.4, plt 368   Anesthesia Other Findings   Reproductive/Obstetrics negative OB ROS                             Anesthesia Physical Anesthesia Plan  ASA: 2  Anesthesia Plan: General   Post-op Pain Management: Tylenol PO (pre-op)*   Induction: Intravenous  PONV Risk Score and Plan: 3 and Ondansetron, Dexamethasone, Midazolam and Treatment may vary due to age or medical condition  Airway Management Planned: LMA  Additional Equipment: None  Intra-op Plan:   Post-operative Plan: Extubation in OR  Informed Consent: I have reviewed the patients History and Physical, chart, labs and discussed the procedure including the risks, benefits and alternatives for the proposed anesthesia with the patient or authorized representative who has indicated his/her understanding and acceptance.     Dental advisory given  Plan Discussed with: CRNA  Anesthesia Plan Comments:        Anesthesia Quick Evaluation

## 2023-12-04 NOTE — Anesthesia Procedure Notes (Signed)
 Procedure Name: LMA Insertion Date/Time: 12/04/2023 11:21 AM  Performed by: Pearson Grippe, CRNAPre-anesthesia Checklist: Patient identified, Emergency Drugs available, Suction available and Patient being monitored Patient Re-evaluated:Patient Re-evaluated prior to induction Oxygen Delivery Method: Circle system utilized Preoxygenation: Pre-oxygenation with 100% oxygen Induction Type: IV induction Ventilation: Mask ventilation without difficulty LMA: LMA inserted LMA Size: 4.0 Number of attempts: 1 Airway Equipment and Method: Bite block Placement Confirmation: positive ETCO2 Tube secured with: Tape Dental Injury: Teeth and Oropharynx as per pre-operative assessment

## 2023-12-07 ENCOUNTER — Encounter (HOSPITAL_BASED_OUTPATIENT_CLINIC_OR_DEPARTMENT_OTHER): Payer: Self-pay | Admitting: Plastic Surgery

## 2023-12-07 LAB — SURGICAL PATHOLOGY

## 2023-12-08 ENCOUNTER — Encounter

## 2023-12-08 ENCOUNTER — Telehealth: Payer: Self-pay | Admitting: Dietician

## 2023-12-08 NOTE — Telephone Encounter (Signed)
 Patient screened on MST. First attempt to reach. Provided my cell# on voice mail to return call to set up a nutrition consult.  Gennaro Africa, RDN, LDN Registered Dietitian, [facility] Part Time Remote (Usual office hours: Tuesday-Thursday) Cell: [phone]

## 2023-12-09 LAB — AEROBIC/ANAEROBIC CULTURE W GRAM STAIN (SURGICAL/DEEP WOUND)

## 2023-12-10 ENCOUNTER — Encounter

## 2023-12-15 ENCOUNTER — Other Ambulatory Visit: Payer: Self-pay

## 2023-12-15 ENCOUNTER — Encounter

## 2023-12-15 DIAGNOSIS — B20 Human immunodeficiency virus [HIV] disease: Secondary | ICD-10-CM

## 2023-12-15 DIAGNOSIS — Z79899 Other long term (current) drug therapy: Secondary | ICD-10-CM

## 2023-12-15 NOTE — Progress Notes (Signed)
 Radiation Oncology         [phone] ________________________________  Name: Briana [name] MRN: [id number]  Date: 12/17/2023  DOB: 05/05/1976  Follow-Up Visit Note  CC: Everlina Hock, NP  Everlina Hock, NP  No diagnosis found.  Diagnosis:  S/p neoadjuvant chemoimmunotherapy and left breast mastectomy with left axillary TAD : Mixed invasive ductal and invasive lobular carcinoma of the left breast, along with DCIS and LCIS - clear margins and negative nodes -- Invasive lobular carcinoma : ER +/ PR+ /  Her2- / Grade 2.  -- Invasive ductal carcinoma : ER+ / PR+ / Her2 - / Grade 3.       Interval Since Last Radiation:  3 months 4 days   Indication for treatment:  curative         Radiation treatment dates:   First Treatment Date: 2023-07-23 Last Treatment Date: 2023-09-15   Site/Dose/Technique/Mode:   Plan Name: CW_L_BH Site: Chest Wall, Left Technique: 3D Mode: Photon Dose Per Fraction: 1.8 Gy Prescribed Dose (Delivered / Prescribed): 25.2 Gy / 25.2 Gy Prescribed Fxs (Delivered / Prescribed): 14 / 14   Plan Name: CW_L_BH_BO Site: Chest Wall, Left Technique: 3D Mode: Photon Dose Per Fraction: 1.8 Gy Prescribed Dose (Delivered / Prescribed): 25.2 Gy / 25.2 Gy Prescribed Fxs (Delivered / Prescribed): 14 / 14   Plan Name: CW_L_SCV_BH Site: Chest Wall, Left Technique: 3D Mode: Photon Dose Per Fraction: 1.8 Gy Prescribed Dose (Delivered / Prescribed): 50.4 Gy / 50.4 Gy Prescribed Fxs (Delivered / Prescribed): 28 / 28   Plan Name: CW_L_Bst_BO Site: Chest Wall, Left Technique: Electron Mode: Electron Dose Per Fraction: 2 Gy Prescribed Dose (Delivered / Prescribed): 10 Gy / 10 Gy Prescribed Fxs (Delivered / Prescribed): 5 / 5   Narrative:  The patient returns today for routine follow-up. She was last seen in office on 11/12/23 for a routine follow up.     She presented to Dr. Marieta Shorten on 11/12/23 with complains of  spontaneous drainage from skin of left breast  and ulceration. On exam, there was no evidence of infection and there is no exposure TE. At that time, she expressed that she's not interested in further reconstruction and therefore she will undergo TE removal soon.   Subsequently, she underwent TE removal and excision mastectomy flap on 12/04/23. Surgical pathology showed benign skin and subcutaneous tissue with acute and chronic  inflammation, abscess formation and fat necrosis which is negative for malignancy.   She then presented for a post-op follow up on 12/10/23, she was advised to hold Xeloda and Dupixent which she had held a week prior to her procedure.   No other significant oncologic interval history since the patient was last seen.                                  Allergies:  is allergic to pumpkin flavoring agent (non-screening).  Meds: Current Outpatient Medications  Medication Sig Dispense Refill   amLODipine (NORVASC) 10 MG tablet TAKE 1 TABLET(10 MG) BY MOUTH DAILY 90 tablet 0   bictegravir-emtricitabine-tenofovir AF (BIKTARVY) 50-200-25 MG TABS tablet TAKE 1 TABLET DAILY 30 tablet 11   capecitabine (XELODA) 500 MG tablet Take 4 tablets (2,000 mg total) by mouth 2 (two) times daily after a meal. Take within 30 minutes after meals. Take for 14 days on, then off for 7 days. Repeat every 21 days. 112 tablet 4   DUPIXENT 300 MG/2ML  SOAJ Inject into the skin. Every 14 days.     ferrous sulfate 325 (65 FE) MG EC tablet Take 325 mg by mouth daily with breakfast.     hydrochlorothiazide (HYDRODIURIL) 12.5 MG tablet Take 1 tablet (12.5 mg total) by mouth daily. 90 tablet 3   HYDROcodone-acetaminophen (NORCO) 5-325 MG tablet Take 1 tablet by mouth every 4 (four) hours as needed for severe pain (pain score 7-10). 15 tablet 0   megestrol (MEGACE ES) 625 MG/5ML suspension Take 5 mLs (625 mg total) by mouth daily. 150 mL 3   methocarbamol (ROBAXIN) 500 MG tablet Take 1 tablet (500 mg total) by mouth every 8 (eight) hours as needed for muscle  spasms. 20 tablet 0   potassium chloride SA (KLOR-CON M) 20 MEQ tablet Take 2 tablets (40 mEq total) by mouth 2 (two) times daily. 60 tablet 0   sulfamethoxazole-trimethoprim (BACTRIM DS) 800-160 MG tablet Take 1 tablet by mouth 2 (two) times daily. 14 tablet 0   triamcinolone (KENALOG) 0.025 % ointment Apply topically 2 (two) times daily as needed. 454 g 5   No current facility-administered medications for this encounter.    Physical Findings: The patient is in no acute distress. Patient is alert and oriented.  vitals were not taken for this visit. .  No significant changes. Lungs are clear to auscultation bilaterally. Heart has regular rate and rhythm. No palpable cervical, supraclavicular, or axillary adenopathy. Abdomen soft, non-tender, normal bowel sounds.   Lab Findings: Lab Results  Component Value Date   WBC 7.6 12/02/2023   HGB 9.4 (L) 12/02/2023   HCT 29.8 (L) 12/02/2023   MCV 86.4 12/02/2023   PLT 368 12/02/2023    Radiographic Findings: No results found.  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  -- Invasive ductal carcinoma : ER+ / PR+ / Her2 - / Grade 3.      The patient is recovering from the effects of radiation.  ***  Plan:  ***   *** minutes of total time was spent for this patient encounter, including preparation, face-to-face counseling with the patient and coordination of care, physical exam, and documentation of the encounter. ____________________________________  Noralee Beam, PhD, MD  This document serves as a record of services personally performed by Retta Caster, MD. It was created on his behalf by Lucky Sable, a trained medical scribe. The creation of this record is based on the scribe's personal  observations and the provider's statements to them. This document has been checked and approved by the attending provider.

## 2023-12-16 NOTE — Progress Notes (Signed)
 Briana [name] is here today for follow up post radiation to the breast.   Breast Side:Left Side   They completed their radiation on: 09/15/2023  Does the patient complain of any of the following: Post radiation skin issues: Denies any issues with her skin Breast Tenderness: denies  Breast Swelling: denies Lymphadema: denies Range of Motion limitations: denies Fatigue post radiation: mild Appetite good/fair/poor: not to good  Additional comments if applicable: Vitals:   12/17/23 0818  BP: 120/82  Pulse: 100  Resp: 18  SpO2: 100%  Weight: 138 lb (62.6 kg)

## 2023-12-17 ENCOUNTER — Encounter (HOSPITAL_BASED_OUTPATIENT_CLINIC_OR_DEPARTMENT_OTHER): Payer: Self-pay | Admitting: Emergency Medicine

## 2023-12-17 ENCOUNTER — Encounter

## 2023-12-17 ENCOUNTER — Encounter: Payer: Self-pay | Admitting: Radiation Oncology

## 2023-12-17 ENCOUNTER — Ambulatory Visit
Admission: RE | Admit: 2023-12-17 | Discharge: 2023-12-17 | Disposition: A | Discharge status: Home / Self Care | Source: Ambulatory Visit | Attending: Radiation Oncology | Admitting: Radiation Oncology

## 2023-12-17 ENCOUNTER — Other Ambulatory Visit: Payer: Self-pay

## 2023-12-17 ENCOUNTER — Inpatient Hospital Stay (HOSPITAL_BASED_OUTPATIENT_CLINIC_OR_DEPARTMENT_OTHER)
Admission: EM | Admit: 2023-12-17 | Discharge: 2023-12-20 | DRG: 543 | Disposition: A | Discharge status: Home / Self Care | Attending: Internal Medicine | Admitting: Internal Medicine

## 2023-12-17 VITALS — BP 120/82 | HR 100 | Resp 18 | Wt 138.0 lb

## 2023-12-17 DIAGNOSIS — Z1732 Human epidermal growth factor receptor 2 negative status: Secondary | ICD-10-CM

## 2023-12-17 DIAGNOSIS — Z923 Personal history of irradiation: Secondary | ICD-10-CM

## 2023-12-17 DIAGNOSIS — C50912 Malignant neoplasm of unspecified site of left female breast: Secondary | ICD-10-CM

## 2023-12-17 DIAGNOSIS — M898X9 Other specified disorders of bone, unspecified site: Secondary | ICD-10-CM | POA: Diagnosis not present

## 2023-12-17 DIAGNOSIS — C50412 Malignant neoplasm of upper-outer quadrant of left female breast: Secondary | ICD-10-CM | POA: Insufficient documentation

## 2023-12-17 DIAGNOSIS — C7951 Secondary malignant neoplasm of bone: Secondary | ICD-10-CM | POA: Diagnosis not present

## 2023-12-17 DIAGNOSIS — Z79624 Long term (current) use of inhibitors of nucleotide synthesis: Secondary | ICD-10-CM | POA: Insufficient documentation

## 2023-12-17 DIAGNOSIS — Z809 Family history of malignant neoplasm, unspecified: Secondary | ICD-10-CM

## 2023-12-17 DIAGNOSIS — Z8489 Family history of other specified conditions: Secondary | ICD-10-CM

## 2023-12-17 DIAGNOSIS — Z833 Family history of diabetes mellitus: Secondary | ICD-10-CM

## 2023-12-17 DIAGNOSIS — C773 Secondary and unspecified malignant neoplasm of axilla and upper limb lymph nodes: Secondary | ICD-10-CM | POA: Insufficient documentation

## 2023-12-17 DIAGNOSIS — C50919 Malignant neoplasm of unspecified site of unspecified female breast: Secondary | ICD-10-CM | POA: Diagnosis present

## 2023-12-17 DIAGNOSIS — K859 Acute pancreatitis without necrosis or infection, unspecified: Principal | ICD-10-CM

## 2023-12-17 DIAGNOSIS — B20 Human immunodeficiency virus [HIV] disease: Secondary | ICD-10-CM | POA: Diagnosis present

## 2023-12-17 DIAGNOSIS — Z79899 Other long term (current) drug therapy: Secondary | ICD-10-CM | POA: Insufficient documentation

## 2023-12-17 DIAGNOSIS — E8721 Acute metabolic acidosis: Secondary | ICD-10-CM | POA: Diagnosis present

## 2023-12-17 DIAGNOSIS — D630 Anemia in neoplastic disease: Secondary | ICD-10-CM | POA: Diagnosis present

## 2023-12-17 DIAGNOSIS — C799 Secondary malignant neoplasm of unspecified site: Secondary | ICD-10-CM

## 2023-12-17 DIAGNOSIS — E871 Hypo-osmolality and hyponatremia: Secondary | ICD-10-CM | POA: Diagnosis present

## 2023-12-17 DIAGNOSIS — D84821 Immunodeficiency due to drugs: Secondary | ICD-10-CM | POA: Insufficient documentation

## 2023-12-17 DIAGNOSIS — R160 Hepatomegaly, not elsewhere classified: Secondary | ICD-10-CM | POA: Diagnosis present

## 2023-12-17 DIAGNOSIS — D72829 Elevated white blood cell count, unspecified: Secondary | ICD-10-CM | POA: Diagnosis present

## 2023-12-17 DIAGNOSIS — Z9221 Personal history of antineoplastic chemotherapy: Secondary | ICD-10-CM

## 2023-12-17 DIAGNOSIS — Z170 Estrogen receptor positive status [ER+]: Secondary | ICD-10-CM | POA: Insufficient documentation

## 2023-12-17 DIAGNOSIS — Z171 Estrogen receptor negative status [ER-]: Secondary | ICD-10-CM

## 2023-12-17 DIAGNOSIS — Z8249 Family history of ischemic heart disease and other diseases of the circulatory system: Secondary | ICD-10-CM

## 2023-12-17 DIAGNOSIS — Z6823 Body mass index (BMI) 23.0-23.9, adult: Secondary | ICD-10-CM

## 2023-12-17 DIAGNOSIS — Z9102 Food additives allergy status: Secondary | ICD-10-CM

## 2023-12-17 DIAGNOSIS — D509 Iron deficiency anemia, unspecified: Secondary | ICD-10-CM | POA: Diagnosis present

## 2023-12-17 DIAGNOSIS — E440 Moderate protein-calorie malnutrition: Secondary | ICD-10-CM | POA: Diagnosis present

## 2023-12-17 DIAGNOSIS — M4854XA Collapsed vertebra, not elsewhere classified, thoracic region, initial encounter for fracture: Secondary | ICD-10-CM | POA: Diagnosis present

## 2023-12-17 DIAGNOSIS — D75839 Thrombocytosis, unspecified: Secondary | ICD-10-CM | POA: Diagnosis present

## 2023-12-17 DIAGNOSIS — I10 Essential (primary) hypertension: Secondary | ICD-10-CM | POA: Diagnosis present

## 2023-12-17 DIAGNOSIS — Z9012 Acquired absence of left breast and nipple: Secondary | ICD-10-CM

## 2023-12-17 DIAGNOSIS — Z1722 Progesterone receptor negative status: Secondary | ICD-10-CM

## 2023-12-17 NOTE — ED Triage Notes (Addendum)
 Pt presents with bilateral leg pain and lower back pain.  Has been intermittent but today has become much worse.  Not improved with any changes in position or OTC meds.  Pt is supposed to be on potassium at home but has not been taking it.  Had recent mastectomy on the left and had a tissue expander that was taken out on Friday, April 4.

## 2023-12-17 NOTE — ED Provider Notes (Addendum)
 [place] [facility] AT [facility] Provider Note   CSN: [id number] Arrival date & time: 12/17/23  2202     History  Chief Complaint  Patient presents with   Back Pain    Briana [name] is a 48 y.o. female with past medical history of HIV, IDA, HTN, left breast cancer S/P mastectomy with no current chemo nor radiation presents emergency department for evaluation of bilateral lower back pain and stiffness in legs bilaterally.  She describes back pain as "throbbing" and rates it at a 7/10 of intensity for the past week.  Due to pain and stiff leg, she has been limping on her left leg.  She reports that she has tried stretches, heating packs without much relief. Was going to f/u with PCP tomorrow but sought ED evaluation at request of family. She denies chest pain, shortness of breath, urinary symptoms, pedal edema, IVDU, saddle paresthesia, fevers, history of DVT/PE, recent trauma  Had oncology routine f/u this morning. Recently had left breast tissue expander removal 2/2 to infection.    Back Pain     Home Medications Prior to Admission medications   Medication Sig Start Date End Date Taking? Authorizing Provider  amLODipine  (NORVASC ) 10 MG tablet TAKE 1 TABLET(10 MG) BY MOUTH DAILY 09/28/23   Beck, Taylor B, NP  bictegravir-emtricitabine -tenofovir  AF (BIKTARVY ) 50-200-25 MG TABS tablet TAKE 1 TABLET DAILY 01/08/23   Comer, Judithann Novas, MD  capecitabine  (XELODA ) 500 MG tablet Take 4 tablets (2,000 mg total) by mouth 2 (two) times daily after a meal. Take within 30 minutes after meals. Take for 14 days on, then off for 7 days. Repeat every 21 days. Patient not taking: Reported on 12/17/2023 09/22/23   Ivor Mars, MD  DUPIXENT 300 MG/2ML SOAJ Inject into the skin. Every 14 days. Patient not taking: Reported on 12/17/2023 09/07/23   [provider]  ferrous sulfate  325 (65 FE) MG EC tablet Take 325 mg by mouth daily with breakfast.    [provider]   hydrochlorothiazide  (HYDRODIURIL ) 12.5 MG tablet Take 1 tablet (12.5 mg total) by mouth daily. 02/11/23   Everlina Hock, NP  HYDROcodone -acetaminophen  (NORCO) 5-325 MG tablet Take 1 tablet by mouth every 4 (four) hours as needed for severe pain (pain score 7-10). Patient not taking: Reported on 12/17/2023 12/04/23   Alger Infield, MD  megestrol  (MEGACE  ES) 625 MG/5ML suspension Take 5 mLs (625 mg total) by mouth daily. 12/02/23   Ivor Mars, MD  methocarbamol  (ROBAXIN ) 500 MG tablet Take 1 tablet (500 mg total) by mouth every 8 (eight) hours as needed for muscle spasms. 12/04/23   Alger Infield, MD  potassium chloride  SA (KLOR-CON  M) 20 MEQ tablet Take 2 tablets (40 mEq total) by mouth 2 (two) times daily. 08/02/23   Jerilynn Montenegro, MD  sulfamethoxazole -trimethoprim  (BACTRIM  DS) 800-160 MG tablet Take 1 tablet by mouth 2 (two) times daily. Patient not taking: Reported on 12/17/2023 12/04/23   Alger Infield, MD  triamcinolone  (KENALOG ) 0.025 % ointment Apply topically 2 (two) times daily as needed. 10/14/23   Comer, Judithann Novas, MD      Allergies    Pumpkin flavoring agent (non-screening)    Review of Systems   Review of Systems  Musculoskeletal:  Positive for back pain.    Physical Exam Updated Vital Signs BP 118/85 (BP Location: Right Arm)   Pulse (!) 112   Temp 99.3 F (37.4 C) (Oral)   Resp (!) 26   LMP 11/25/2023  Comment: negative UPT  SpO2 97%  Physical Exam Vitals and nursing note reviewed.  Constitutional:      General: She is not in acute distress.    Appearance: Normal appearance.  HENT:     Head: Normocephalic and atraumatic.  Eyes:     General: No visual field deficit.    Conjunctiva/sclera: Conjunctivae normal.  Cardiovascular:     Rate and Rhythm: Tachycardia present.     Pulses:          Dorsalis pedis pulses are 2+ on the right side and 2+ on the left side.  Pulmonary:     Effort: Pulmonary effort is normal. No respiratory distress.     Breath sounds:  Normal breath sounds.  Chest:     Chest wall: No tenderness.  Abdominal:     General: Bowel sounds are normal. There is no distension.     Palpations: Abdomen is soft.     Tenderness: There is no abdominal tenderness. There is no guarding.  Musculoskeletal:     Cervical back: Normal range of motion and neck supple. No rigidity.     Right lower leg: No edema.     Left lower leg: No edema.     Comments: No swelling or tenderness to BLE bilaterally.  Negative Homans' sign  Skin:    General: Skin is warm.     Coloration: Skin is not jaundiced or pale.  Neurological:     Mental Status: She is alert and oriented to person, place, and time.     GCS: GCS eye subscore is 4. GCS verbal subscore is 5. GCS motor subscore is 6.     Cranial Nerves: No dysarthria or facial asymmetry.     Sensory: No sensory deficit.     Motor: Weakness (LLE 2/2 to back pain) present. No tremor, abnormal muscle tone, seizure activity or pronator drift.     Coordination: Finger-Nose-Finger Test normal.     Gait: Gait abnormal.     ED Results / Procedures / Treatments   Labs (all labs ordered are listed, but only abnormal results are displayed) Labs Reviewed  CBC WITH DIFFERENTIAL/PLATELET - Abnormal; Notable for the following components:      Result Value   WBC 11.5 (*)    RBC 3.40 (*)    Hemoglobin 9.3 (*)    HCT 29.0 (*)    RDW 17.8 (*)    Platelets 405 (*)    Neutro Abs 9.6 (*)    All other components within normal limits  BASIC METABOLIC PANEL WITH GFR - Abnormal; Notable for the following components:   Sodium 132 (*)    CO2 18 (*)    Glucose, Bld 108 (*)    All other components within normal limits  CULTURE, BLOOD (ROUTINE X 2)  CULTURE, BLOOD (ROUTINE X 2)  URINALYSIS, ROUTINE W REFLEX MICROSCOPIC  PREGNANCY, URINE  LACTIC ACID, PLASMA  HEPATIC FUNCTION PANEL    EKG None  Radiology No results found.  Procedures Procedures    Medications Ordered in ED Medications  sodium chloride   0.9 % bolus 1,000 mL (has no administration in time range)  fentaNYL  (SUBLIMAZE ) injection 50 mcg (has no administration in time range)  oxyCODONE  (Oxy IR/ROXICODONE ) immediate release tablet 5 mg (has no administration in time range)  lactated ringers  bolus 1,000 mL (has no administration in time range)    ED Course/ Medical Decision Making/ A&P  Medical Decision Making Amount and/or Complexity of Data Reviewed Labs: ordered. Radiology: ordered.  Risk Prescription drug management.   Patient presents to the ED for concern of low back pain, this involves an extensive number of treatment options, and is a complaint that carries with it a high risk of complications and morbidity.  The differential diagnosis includes muscle strain, nephrolithiasis, UTI, epidural abscess   Co morbidities that complicate the patient evaluation  HIV, pmhx of breast cancer   Additional history obtained:  Additional history obtained from Family, Nursing, and Outside Medical Records   External records from outside source obtained and reviewed including triage RN note   Lab Tests:  I Ordered, and personally interpreted labs.  The pertinent results include:   Mild leukocytosis 11.5 Hemoglobin 9.3 (baseline has been 9.4-10.2 over past 2 months) BMP pending at sign out     Cardiac Monitoring:  The patient was maintained on a cardiac monitor.  Ordered. Pending at sign out    Problem List / ED Course:  Low back pain Denies urinary symptoms, incontinence, saddle paresthesia TTP of lumbar paraspinous musculature Tachycardic. No fever, CP, SHOB. May be related to pain but considering spinal abscess or infection in setting of back pain, HIV, past medical history of malignancy Mild leukocytosis of 11.5 Provided LR bolus for tachycardia and analgesia    Dispostion:  Pending labs, completion of ED workup. Sign out to Dr. Monique Ano pending completion of workup.  Appreciate her assistance.   Final Clinical Impression(s) / ED Diagnoses Final diagnoses:  None    Rx / DC Orders ED Discharge Orders     None         Royann Cords, PA 12/18/23 0059    Royann Cords, PA 12/18/23 0101    Kelsey Patricia, MD 12/18/23 [phone]

## 2023-12-18 ENCOUNTER — Emergency Department (HOSPITAL_BASED_OUTPATIENT_CLINIC_OR_DEPARTMENT_OTHER)

## 2023-12-18 ENCOUNTER — Other Ambulatory Visit: Payer: Self-pay

## 2023-12-18 ENCOUNTER — Observation Stay (HOSPITAL_COMMUNITY)

## 2023-12-18 DIAGNOSIS — C7951 Secondary malignant neoplasm of bone: Secondary | ICD-10-CM | POA: Diagnosis present

## 2023-12-18 DIAGNOSIS — M898X9 Other specified disorders of bone, unspecified site: Secondary | ICD-10-CM | POA: Diagnosis present

## 2023-12-18 DIAGNOSIS — Z833 Family history of diabetes mellitus: Secondary | ICD-10-CM | POA: Diagnosis not present

## 2023-12-18 DIAGNOSIS — D75839 Thrombocytosis, unspecified: Secondary | ICD-10-CM | POA: Diagnosis present

## 2023-12-18 DIAGNOSIS — Z8489 Family history of other specified conditions: Secondary | ICD-10-CM | POA: Diagnosis not present

## 2023-12-18 DIAGNOSIS — C50412 Malignant neoplasm of upper-outer quadrant of left female breast: Secondary | ICD-10-CM | POA: Diagnosis present

## 2023-12-18 DIAGNOSIS — Z923 Personal history of irradiation: Secondary | ICD-10-CM | POA: Diagnosis not present

## 2023-12-18 DIAGNOSIS — I10 Essential (primary) hypertension: Secondary | ICD-10-CM | POA: Diagnosis present

## 2023-12-18 DIAGNOSIS — Z9221 Personal history of antineoplastic chemotherapy: Secondary | ICD-10-CM | POA: Diagnosis not present

## 2023-12-18 DIAGNOSIS — R160 Hepatomegaly, not elsewhere classified: Secondary | ICD-10-CM | POA: Diagnosis present

## 2023-12-18 DIAGNOSIS — E871 Hypo-osmolality and hyponatremia: Secondary | ICD-10-CM | POA: Diagnosis present

## 2023-12-18 DIAGNOSIS — Z6823 Body mass index (BMI) 23.0-23.9, adult: Secondary | ICD-10-CM | POA: Diagnosis not present

## 2023-12-18 DIAGNOSIS — Z9102 Food additives allergy status: Secondary | ICD-10-CM | POA: Diagnosis not present

## 2023-12-18 DIAGNOSIS — E8721 Acute metabolic acidosis: Secondary | ICD-10-CM | POA: Diagnosis present

## 2023-12-18 DIAGNOSIS — Z79899 Other long term (current) drug therapy: Secondary | ICD-10-CM | POA: Diagnosis not present

## 2023-12-18 DIAGNOSIS — Z9012 Acquired absence of left breast and nipple: Secondary | ICD-10-CM | POA: Diagnosis not present

## 2023-12-18 DIAGNOSIS — M4854XA Collapsed vertebra, not elsewhere classified, thoracic region, initial encounter for fracture: Secondary | ICD-10-CM | POA: Diagnosis present

## 2023-12-18 DIAGNOSIS — B20 Human immunodeficiency virus [HIV] disease: Secondary | ICD-10-CM | POA: Diagnosis present

## 2023-12-18 DIAGNOSIS — D509 Iron deficiency anemia, unspecified: Secondary | ICD-10-CM | POA: Diagnosis present

## 2023-12-18 DIAGNOSIS — C773 Secondary and unspecified malignant neoplasm of axilla and upper limb lymph nodes: Secondary | ICD-10-CM | POA: Diagnosis present

## 2023-12-18 DIAGNOSIS — Z809 Family history of malignant neoplasm, unspecified: Secondary | ICD-10-CM | POA: Diagnosis not present

## 2023-12-18 DIAGNOSIS — Z8249 Family history of ischemic heart disease and other diseases of the circulatory system: Secondary | ICD-10-CM | POA: Diagnosis not present

## 2023-12-18 DIAGNOSIS — E440 Moderate protein-calorie malnutrition: Secondary | ICD-10-CM | POA: Diagnosis present

## 2023-12-18 DIAGNOSIS — D630 Anemia in neoplastic disease: Secondary | ICD-10-CM | POA: Diagnosis present

## 2023-12-18 DIAGNOSIS — D72829 Elevated white blood cell count, unspecified: Secondary | ICD-10-CM | POA: Diagnosis present

## 2023-12-18 LAB — CBC WITH DIFFERENTIAL/PLATELET
Abs Immature Granulocytes: 0.05 10*3/uL (ref 0.00–0.07)
Basophils Absolute: 0 10*3/uL (ref 0.0–0.1)
Basophils Relative: 0 %
Eosinophils Absolute: 0.4 10*3/uL (ref 0.0–0.5)
Eosinophils Relative: 3 %
HCT: 29 % — ABNORMAL LOW (ref 36.0–46.0)
Hemoglobin: 9.3 g/dL — ABNORMAL LOW (ref 12.0–15.0)
Immature Granulocytes: 0 %
Lymphocytes Relative: 6 %
Lymphs Abs: 0.7 10*3/uL (ref 0.7–4.0)
MCH: 27.4 pg (ref 26.0–34.0)
MCHC: 32.1 g/dL (ref 30.0–36.0)
MCV: 85.3 fL (ref 80.0–100.0)
Monocytes Absolute: 0.7 10*3/uL (ref 0.1–1.0)
Monocytes Relative: 6 %
Neutro Abs: 9.6 10*3/uL — ABNORMAL HIGH (ref 1.7–7.7)
Neutrophils Relative %: 85 %
Platelets: 405 10*3/uL — ABNORMAL HIGH (ref 150–400)
RBC: 3.4 MIL/uL — ABNORMAL LOW (ref 3.87–5.11)
RDW: 17.8 % — ABNORMAL HIGH (ref 11.5–15.5)
WBC: 11.5 10*3/uL — ABNORMAL HIGH (ref 4.0–10.5)
nRBC: 0 % (ref 0.0–0.2)

## 2023-12-18 LAB — BASIC METABOLIC PANEL WITH GFR
Anion gap: 9 (ref 5–15)
BUN: 13 mg/dL (ref 6–20)
CO2: 18 mmol/L — ABNORMAL LOW (ref 22–32)
Calcium: 8.9 mg/dL (ref 8.9–10.3)
Chloride: 105 mmol/L (ref 98–111)
Creatinine, Ser: 0.69 mg/dL (ref 0.44–1.00)
GFR, Estimated: >60 mL/min (ref >60)
Glucose, Bld: 108 mg/dL — ABNORMAL HIGH (ref 70–99)
Potassium: 3.7 mmol/L (ref 3.5–5.1)
Sodium: 132 mmol/L — ABNORMAL LOW (ref 135–145)

## 2023-12-18 LAB — URINALYSIS, ROUTINE W REFLEX MICROSCOPIC
Bilirubin Urine: NEGATIVE
Glucose, UA: NEGATIVE mg/dL
Hgb urine dipstick: NEGATIVE
Ketones, ur: NEGATIVE mg/dL
Leukocytes,Ua: NEGATIVE
Nitrite: NEGATIVE
Protein, ur: NEGATIVE mg/dL
Specific Gravity, Urine: 1.025 (ref 1.005–1.030)
pH: 6 (ref 5.0–8.0)

## 2023-12-18 LAB — PREGNANCY, URINE: Preg Test, Ur: NEGATIVE

## 2023-12-18 LAB — LACTIC ACID, PLASMA: Lactic Acid, Venous: 0.6 mmol/L (ref 0.5–1.9)

## 2023-12-18 LAB — HEPATIC FUNCTION PANEL
ALT: 9 U/L (ref 0–44)
AST: 15 U/L (ref 15–41)
Albumin: 2.8 g/dL — ABNORMAL LOW (ref 3.5–5.0)
Alkaline Phosphatase: 143 U/L — ABNORMAL HIGH (ref 38–126)
Bilirubin, Direct: 0.1 mg/dL (ref 0.0–0.2)
Indirect Bilirubin: 0.2 mg/dL — ABNORMAL LOW (ref 0.3–0.9)
Total Bilirubin: 0.3 mg/dL (ref 0.0–1.2)
Total Protein: 8.2 g/dL — ABNORMAL HIGH (ref 6.5–8.1)

## 2023-12-18 LAB — LIPASE, BLOOD: Lipase: 27 U/L (ref 11–51)

## 2023-12-18 MED ORDER — FENTANYL CITRATE PF 50 MCG/ML IJ SOSY
50.0000 ug | PREFILLED_SYRINGE | Freq: Once | INTRAMUSCULAR | Status: AC
  Administered 2023-12-18: 50 ug via INTRAVENOUS
  Filled 2023-12-18: qty 1

## 2023-12-18 MED ORDER — SODIUM CHLORIDE 0.9% FLUSH
10.0000 mL | Freq: Two times a day (BID) | INTRAVENOUS | Status: DC
  Administered 2023-12-18 – 2023-12-20 (×4): 10 mL

## 2023-12-18 MED ORDER — CHLORHEXIDINE GLUCONATE CLOTH 2 % EX PADS
6.0000 | MEDICATED_PAD | Freq: Every day | CUTANEOUS | Status: DC
  Administered 2023-12-18 – 2023-12-19 (×2): 6 via TOPICAL

## 2023-12-18 MED ORDER — ACETAMINOPHEN 650 MG RE SUPP: 650.0000 mg | Freq: Four times a day (QID) | RECTAL | Status: DC | PRN

## 2023-12-18 MED ORDER — CAPECITABINE 500 MG PO TABS: 2000.0000 mg | ORAL_TABLET | Freq: Two times a day (BID) | ORAL | Status: DC

## 2023-12-18 MED ORDER — POTASSIUM CHLORIDE CRYS ER 20 MEQ PO TBCR
20.0000 meq | EXTENDED_RELEASE_TABLET | Freq: Two times a day (BID) | ORAL | Status: DC
  Administered 2023-12-19 – 2023-12-20 (×3): 20 meq via ORAL
  Filled 2023-12-18 (×3): qty 1

## 2023-12-18 MED ORDER — METHOCARBAMOL 500 MG PO TABS: 500.0000 mg | ORAL_TABLET | Freq: Three times a day (TID) | ORAL | Status: DC | PRN

## 2023-12-18 MED ORDER — OXYCODONE HCL 5 MG PO TABS
5.0000 mg | ORAL_TABLET | Freq: Once | ORAL | Status: AC
  Administered 2023-12-18: 5 mg via ORAL
  Filled 2023-12-18: qty 1

## 2023-12-18 MED ORDER — MEGESTROL ACETATE 400 MG/10ML PO SUSP
625.0000 mg | Freq: Every day | ORAL | Status: DC
Start: 2023-12-19 — End: 2023-12-20
  Administered 2023-12-19 – 2023-12-20 (×2): 625 mg via ORAL
  Filled 2023-12-18 (×2): qty 20

## 2023-12-18 MED ORDER — TRIAMCINOLONE ACETONIDE 0.025 % EX CREA: 1.0000 | TOPICAL_CREAM | CUTANEOUS | Status: DC | PRN

## 2023-12-18 MED ORDER — ONDANSETRON HCL 4 MG PO TABS: 4.0000 mg | ORAL_TABLET | Freq: Four times a day (QID) | ORAL | Status: DC | PRN

## 2023-12-18 MED ORDER — OXYCODONE HCL 5 MG PO TABS
5.0000 mg | ORAL_TABLET | ORAL | Status: DC | PRN
  Administered 2023-12-18 – 2023-12-20 (×6): 5 mg via ORAL
  Filled 2023-12-18 (×6): qty 1

## 2023-12-18 MED ORDER — GADOBUTROL 1 MMOL/ML IV SOLN
6.0000 mL | Freq: Once | INTRAVENOUS | Status: AC | PRN
  Administered 2023-12-18: 6 mL via INTRAVENOUS

## 2023-12-18 MED ORDER — SODIUM CHLORIDE 0.9 % IV BOLUS
1000.0000 mL | Freq: Once | INTRAVENOUS | Status: AC
  Administered 2023-12-18: 1000 mL via INTRAVENOUS

## 2023-12-18 MED ORDER — ACETAMINOPHEN 325 MG PO TABS: 650.0000 mg | ORAL_TABLET | Freq: Four times a day (QID) | ORAL | Status: DC | PRN

## 2023-12-18 MED ORDER — BICTEGRAVIR-EMTRICITAB-TENOFOV 50-200-25 MG PO TABS
1.0000 | ORAL_TABLET | Freq: Every day | ORAL | Status: DC
  Administered 2023-12-18 – 2023-12-20 (×3): 1 via ORAL
  Filled 2023-12-18 (×3): qty 1

## 2023-12-18 MED ORDER — IOHEXOL 300 MG/ML  SOLN
100.0000 mL | Freq: Once | INTRAMUSCULAR | Status: AC | PRN
  Administered 2023-12-18: 100 mL via INTRAVENOUS

## 2023-12-18 MED ORDER — ENOXAPARIN SODIUM 40 MG/0.4ML IJ SOSY
40.0000 mg | PREFILLED_SYRINGE | INTRAMUSCULAR | Status: DC
  Administered 2023-12-18 – 2023-12-19 (×2): 40 mg via SUBCUTANEOUS
  Filled 2023-12-18 (×2): qty 0.4

## 2023-12-18 MED ORDER — HYDROCHLOROTHIAZIDE 12.5 MG PO TABS
12.5000 mg | ORAL_TABLET | Freq: Every day | ORAL | Status: DC
  Administered 2023-12-19: 12.5 mg via ORAL
  Filled 2023-12-18: qty 1

## 2023-12-18 MED ORDER — ENSURE ENLIVE PO LIQD
237.0000 mL | Freq: Two times a day (BID) | ORAL | Status: DC
  Administered 2023-12-18 – 2023-12-20 (×4): 237 mL via ORAL

## 2023-12-18 MED ORDER — METOPROLOL SUCCINATE ER 25 MG PO TB24
25.0000 mg | ORAL_TABLET | Freq: Every day | ORAL | Status: DC
  Administered 2023-12-18 – 2023-12-20 (×3): 25 mg via ORAL
  Filled 2023-12-18 (×3): qty 1

## 2023-12-18 MED ORDER — FERROUS SULFATE 325 (65 FE) MG PO TABS
325.0000 mg | ORAL_TABLET | Freq: Every day | ORAL | Status: DC
  Administered 2023-12-19 – 2023-12-20 (×2): 325 mg via ORAL
  Filled 2023-12-18 (×2): qty 1

## 2023-12-18 MED ORDER — LACTATED RINGERS IV BOLUS
1000.0000 mL | Freq: Once | INTRAVENOUS | Status: AC
  Administered 2023-12-18: 1000 mL via INTRAVENOUS

## 2023-12-18 MED ORDER — FENTANYL CITRATE PF 50 MCG/ML IJ SOSY: 25.0000 ug | PREFILLED_SYRINGE | INTRAMUSCULAR | Status: DC | PRN

## 2023-12-18 MED ORDER — SODIUM CHLORIDE 0.9% FLUSH: 10.0000 mL | INTRAVENOUS | Status: DC | PRN

## 2023-12-18 MED ORDER — ONDANSETRON HCL 4 MG/2ML IJ SOLN: 4.0000 mg | Freq: Four times a day (QID) | INTRAMUSCULAR | Status: DC | PRN

## 2023-12-18 NOTE — ED Notes (Signed)
 [facility] called for transport, No Current ETA patent is going ED to [facility]. ED Nurse aware  Called @ 04:48 am

## 2023-12-18 NOTE — ED Provider Notes (Signed)
   Accepted handoff at shift change from Dr. Griselda. Please see prior provider note for more detail.   Briefly: Patient is 48 y.o.  presents emergency department for evaluation of bilateral lower back pain and stiffness in legs bilaterally.  She describes back pain as throbbing and rates it at a 7/10 of intensity for the past week.  Due to pain and stiff leg, she has been limping on her left leg.  She reports that she has tried stretches, heating packs without much relief. Was going to f/u with PCP tomorrow but sought ED evaluation at request of family. She denies chest pain, shortness of breath, urinary symptoms, pedal edema, IVDU, saddle paresthesia, fevers, history of DVT/PE, recent trauma  Plan:  - patient transferred from [facility] ED to be admitted. Patient arrived to [facility] ED around 6AM. Patient complaining of returning pain around 8AM which I provided fentanyl  for. Patient now resting comfortably in bed. They do not have any questions for me at this time.  - I reached out to hospitalist to let them know patient has arrived to ED. - Dr. Celinda admitting provider.     Briana [name], [place] 12/18/23 0910    Griselda Norris, MD 12/23/23 2246

## 2023-12-18 NOTE — Progress Notes (Cosign Needed Addendum)
 Briana [name]   DOB:May 10, 1976   HQ#:[id number]      ASSESSMENT & PLAN:  Infiltrating ductal carcinoma left breast, triple negative Newly diagnosed lytic lesions - Initially diagnosed 01/20/2023 status post biopsy.  Repeat pathology 06/08/2023 confirmed invasive carcinoma with mixed ductal and lobular features.  ER negative.  PR negative.  HER2 negative by FISH.  Ki-67 90% - Status post left mastectomy on 06/08/2019 for.  Status post radiation therapy completed on 09/15/2023. - Patient had a lot of edema and swelling with discharge from the left breast.  Implant was removed earlier this month. - On neoadjuvant chemoimmunotherapy with carbo/Taxol /Pembro started 02/19/2023.   -Also on Xeloda  (capecitabine ) started 09/28/2023. - CT scans done 12/18/2023 shows multiple lytic lesions within the axial skeleton throughout the lumbar spine, new since prior exam, likely bone mets.  Associated endplate fracture U13. - MRI spine done 12/18/2023 confirms multiple lesions throughout thoracic and lumbar spine concerning for bony mets. - Medical oncology/Dr. Maria Shiner following closely.  She was last seen in outpatient oncology office 12/02/2023.  Further evaluation and treatment options will be outlined by Dr. Maria Shiner.  Back pain  with lower extremity discomfort - Secondary to bone mets - Patient reports pain and discomfort started 1 week ago - Continue pain medications as ordered  Anemia, normocytic - Hemoglobin 9.3 on 4/17.  Baseline appears to be in the 10-12 range. - Likely multifactorial due to malignancy, iron  and nutritional deficiencies - Status post IV iron  as outpatient. - No transfusional intervention required at this time - Monitor CBC with differential closely  Thrombocytosis - Mild - Likely reactive.  No intervention required at this time. - Monitor CBC with differential.  Hepatomegaly - Moderate.  Per CT scan done 12/18/2023. - Hepatic function panel done shows elevated alk phos with normal  T. bili.  AST/ALT WNL. - Continue to monitor CMP closely  HIV+ High risk HPV/low-grade squamous intraepithelial lesion (LSIL) - Diagnosed 1996.  Continue HIV meds as ordered. - Follow closely with ID  Code Status Full  Subjective:  Patient seen awake alert and oriented x 3 laying on bed in the ED. family members at bedside.  She reports that approximately 1 week ago her legs began to feel stiff and developed back pain which increased in intensity.  She thought about waiting to see a PCP however due to increasing pain her family encouraged her to come to ED.  Patient is teary and upset over hearing that her cancer has spread to her bones.   Objective:  Vitals:   12/18/23 1047 12/18/23 1321  BP:  (!) 136/91  Pulse:  (!) 115  Resp:  18  Temp: 98 F (36.7 C)   SpO2:  100%     Intake/Output Summary (Last 24 hours) at 12/18/2023 1345 Last data filed at 12/18/2023 0230 Gross per 24 hour  Intake 2000 ml  Output --  Net 2000 ml     PHYSICAL EXAMINATION: ECOG PERFORMANCE STATUS: 1 - Symptomatic but completely ambulatory  Vitals:   12/18/23 1047 12/18/23 1321  BP:  (!) 136/91  Pulse:  (!) 115  Resp:  18  Temp: 98 F (36.7 C)   SpO2:  100%   There were no vitals filed for this visit.  GENERAL: alert, no distress and comfortable SKIN: skin color, texture, turgor are normal, no rashes or significant lesions EYES: normal, conjunctiva are pink and non-injected, sclera clear OROPHARYNX: no exudate, no erythema and lips, buccal mucosa, and tongue normal  NECK: supple, thyroid  normal  size, non-tender, without nodularity LYMPH: no palpable lymphadenopathy in the cervical, axillary or inguinal LUNGS: clear to auscultation and percussion with normal breathing effort HEART: regular rate & rhythm and no murmurs and no lower extremity edema ABDOMEN: abdomen soft, non-tender and normal bowel sounds MUSCULOSKELETAL: no cyanosis of digits and no clubbing  PSYCH: alert & oriented x 3 with  fluent speech NEURO: no focal motor/sensory deficits   All questions were answered. The patient knows to call the clinic with any problems, questions or concerns.   The total time spent in the appointment was 40 minutes encounter with patient including review of chart and various tests results, discussions about plan of care and coordination of care plan  Jacqualin Mate, NP 12/18/2023 1:45 PM    Labs Reviewed:  Lab Results  Component Value Date   WBC 11.5 (H) 12/17/2023   HGB 9.3 (L) 12/17/2023   HCT 29.0 (L) 12/17/2023   MCV 85.3 12/17/2023   PLT 405 (H) 12/17/2023   Recent Labs    08/02/23 0805 08/02/23 0813 10/21/23 0835 12/02/23 0818 12/17/23 2340  NA  --    < > 138 138 132*  K  --    < > 3.8 3.4* 3.7  CL  --    < > 110 107 105  CO2  --    < > 22 21* 18*  GLUCOSE  --    < > 98 108* 108*  BUN  --    < > 10 15 13   CREATININE  --    < > 0.68 0.61 0.69  CALCIUM  --    < > 9.0 9.0 8.9  GFRNONAA  --    < > >60 >60 >60  PROT 8.8*   < > 7.4 7.9 8.2*  ALBUMIN 4.0   < > 3.7 3.4* 2.8*  AST 38   < > 27 11* 15  ALT 21   < > 19 7 9   ALKPHOS 134*   < > 147* 131* 143*  BILITOT 0.8   < > 0.3 0.2 0.3  BILIDIR 0.1  --   --   --  0.1  IBILI 0.7  --   --   --  0.2*   < > = values in this interval not displayed.    Studies Reviewed:  MR Lumbar Spine W Wo Contrast Result Date: 12/18/2023 CLINICAL DATA:  Chronic myelopathy thoracic and lumbar spine. Bilateral lower back pain and stiffness in both legs. History of stage III breast cancer status post lumpectomy radiation and chemotherapy. Completing course of antibiotics for infection in tissue expander pocket. EXAM: MRI THORACIC AND LUMBAR SPINE WITHOUT AND WITH CONTRAST TECHNIQUE: Multiplanar and multiecho pulse sequences of the thoracic and lumbar spine were obtained without and with intravenous contrast. CONTRAST:  6mL GADAVIST  GADOBUTROL  1 MMOL/ML IV SOLN COMPARISON:  CT lumbar spine same day. FINDINGS: MRI THORACIC SPINE FINDINGS  Alignment:  Thoracic kyphosis is maintained.  No listhesis. Vertebrae: Diffuse signal abnormality throughout the T10 vertebral body with edema and diffuse enhancement. There is extension of signal abnormality into the right pedicle with possible expansion of the pedicle as well as signal abnormality and enhancement extending into the pars interarticularis and facets on the right. Irregularity of the T10 superior endplate with minimal height loss. Slight bowing of the posterior cortex of the T10 vertebral body without significant retropulsion appreciated. STIR hyperintense lesion in the T2 vertebral body without definite enhancement. Additional enhancing lesion in the left inferior aspect of T4.  Enhancing lesion in the left posterosuperior aspect of T8 with involvement of the left pedicle. T1 and T2 hyperintense lesion in the T5 vertebral body suggestive of hemangioma. Irregularity of the T6 inferior endplate with mild edema without evidence of focal lesion, likely degenerative. Focal irregularity of the T11 inferior endplate likely reflecting Schmorl's node. More masslike signal abnormality and enhancement in the left anterior aspect of T11 likely reflecting osseous lesion. Small enhancing lesion in the superior aspect of T12. Cord:  Normal signal and morphology. Paraspinal and other soft tissues: The visualized paraspinal soft tissues are unremarkable. There is no evidence of abnormal epidural enhancement or epidural fluid collection. Suggestion of heterogeneous opacities in the bilateral lower lobes. Trace bilateral pleural effusions. Disc levels: Disc heights are maintained. Mild disc desiccation at multiple levels in the midthoracic spine. There is no large disc herniation or high-grade spinal canal stenosis. Facet arthrosis at multiple levels. Mild foraminal stenosis on the left at T6-7. Additional mild-to-moderate foraminal stenosis on the right at T10-11. MRI LUMBAR SPINE FINDINGS Segmentation:  Standard.  Alignment: Lumbar lordosis is maintained. No significant listhesis. Vertebrae: STIR hyperintense enhancing lesion in the left aspect of the L1 vertebral body. Similar smaller appearing lesion in the right aspect of the L2 vertebral body and in the left anterior inferior aspect of L4. Prominent focus of signal abnormality and enhancement in the S1 vertebra with a cystic focus anteriorly. Additional partially visualized signal abnormality and enhancement involving the S3 vertebra. Additional signal abnormality and enhancement in the L4 spinous process. Conus medullaris: Extends to the L1-2 level and appears normal. The visualized conus medullaris is normal in caliber and signal intensity. Normal appearance of the cauda equina nerve roots. Linear enhancement along the ventral aspect of the lower thoracic cord and conus medullaris is likely vascular in etiology. No nodular or masslike enhancement noted. Paraspinal and other soft tissues: There is edema in the paraspinal musculature adjacent to the bilateral L4-5 facets likely secondary to facet arthrosis. There is also signal abnormality and likely edema throughout the visualized retroperitoneum without evidence of associated enhancement. Disc levels: Minimal disc bulges at L1-2 and L2-3 without significant spinal canal stenosis. Disc bulge and facet arthrosis at L3-4. There is mild lateral recess narrowing at L3-4 without significant spinal canal stenosis. Disc desiccation at L4-5 with a diffuse disc bulge and central disc protrusion resulting in lateral recess narrowing. Moderate facet arthrosis at L4-5. Mild spinal canal stenosis at L4-5. IMPRESSION: Multiple enhancing lesions throughout the thoracic and lumbar spine as well as the partially visualized sacrum concerning for osseous metastatic disease. Diffuse signal abnormality throughout the T10 vertebra with bowing of the posterior cortex, subtle irregularity of the superior endplate and minimal height loss  concerning for pathologic compression fracture. No significant retropulsion. Degenerative changes of the thoracic and lumbar spine as above. No high-grade spinal canal stenosis. Facet arthrosis at multiple levels most pronounced at L4-5. Diffuse edema throughout the retroperitoneum likely related to pancreatitis better evaluated on same day CT abdomen pelvis. Electronically Signed   By: Denny Flack M.D.   On: 12/18/2023 11:41   MR THORACIC SPINE W WO CONTRAST Result Date: 12/18/2023 CLINICAL DATA:  Chronic myelopathy thoracic and lumbar spine. Bilateral lower back pain and stiffness in both legs. History of stage III breast cancer status post lumpectomy radiation and chemotherapy. Completing course of antibiotics for infection in tissue expander pocket. EXAM: MRI THORACIC AND LUMBAR SPINE WITHOUT AND WITH CONTRAST TECHNIQUE: Multiplanar and multiecho pulse sequences of the thoracic and lumbar  spine were obtained without and with intravenous contrast. CONTRAST:  6mL GADAVIST  GADOBUTROL  1 MMOL/ML IV SOLN COMPARISON:  CT lumbar spine same day. FINDINGS: MRI THORACIC SPINE FINDINGS Alignment:  Thoracic kyphosis is maintained.  No listhesis. Vertebrae: Diffuse signal abnormality throughout the T10 vertebral body with edema and diffuse enhancement. There is extension of signal abnormality into the right pedicle with possible expansion of the pedicle as well as signal abnormality and enhancement extending into the pars interarticularis and facets on the right. Irregularity of the T10 superior endplate with minimal height loss. Slight bowing of the posterior cortex of the T10 vertebral body without significant retropulsion appreciated. STIR hyperintense lesion in the T2 vertebral body without definite enhancement. Additional enhancing lesion in the left inferior aspect of T4. Enhancing lesion in the left posterosuperior aspect of T8 with involvement of the left pedicle. T1 and T2 hyperintense lesion in the T5 vertebral  body suggestive of hemangioma. Irregularity of the T6 inferior endplate with mild edema without evidence of focal lesion, likely degenerative. Focal irregularity of the T11 inferior endplate likely reflecting Schmorl's node. More masslike signal abnormality and enhancement in the left anterior aspect of T11 likely reflecting osseous lesion. Small enhancing lesion in the superior aspect of T12. Cord:  Normal signal and morphology. Paraspinal and other soft tissues: The visualized paraspinal soft tissues are unremarkable. There is no evidence of abnormal epidural enhancement or epidural fluid collection. Suggestion of heterogeneous opacities in the bilateral lower lobes. Trace bilateral pleural effusions. Disc levels: Disc heights are maintained. Mild disc desiccation at multiple levels in the midthoracic spine. There is no large disc herniation or high-grade spinal canal stenosis. Facet arthrosis at multiple levels. Mild foraminal stenosis on the left at T6-7. Additional mild-to-moderate foraminal stenosis on the right at T10-11. MRI LUMBAR SPINE FINDINGS Segmentation:  Standard. Alignment: Lumbar lordosis is maintained. No significant listhesis. Vertebrae: STIR hyperintense enhancing lesion in the left aspect of the L1 vertebral body. Similar smaller appearing lesion in the right aspect of the L2 vertebral body and in the left anterior inferior aspect of L4. Prominent focus of signal abnormality and enhancement in the S1 vertebra with a cystic focus anteriorly. Additional partially visualized signal abnormality and enhancement involving the S3 vertebra. Additional signal abnormality and enhancement in the L4 spinous process. Conus medullaris: Extends to the L1-2 level and appears normal. The visualized conus medullaris is normal in caliber and signal intensity. Normal appearance of the cauda equina nerve roots. Linear enhancement along the ventral aspect of the lower thoracic cord and conus medullaris is likely  vascular in etiology. No nodular or masslike enhancement noted. Paraspinal and other soft tissues: There is edema in the paraspinal musculature adjacent to the bilateral L4-5 facets likely secondary to facet arthrosis. There is also signal abnormality and likely edema throughout the visualized retroperitoneum without evidence of associated enhancement. Disc levels: Minimal disc bulges at L1-2 and L2-3 without significant spinal canal stenosis. Disc bulge and facet arthrosis at L3-4. There is mild lateral recess narrowing at L3-4 without significant spinal canal stenosis. Disc desiccation at L4-5 with a diffuse disc bulge and central disc protrusion resulting in lateral recess narrowing. Moderate facet arthrosis at L4-5. Mild spinal canal stenosis at L4-5. IMPRESSION: Multiple enhancing lesions throughout the thoracic and lumbar spine as well as the partially visualized sacrum concerning for osseous metastatic disease. Diffuse signal abnormality throughout the T10 vertebra with bowing of the posterior cortex, subtle irregularity of the superior endplate and minimal height loss concerning for pathologic compression  fracture. No significant retropulsion. Degenerative changes of the thoracic and lumbar spine as above. No high-grade spinal canal stenosis. Facet arthrosis at multiple levels most pronounced at L4-5. Diffuse edema throughout the retroperitoneum likely related to pancreatitis better evaluated on same day CT abdomen pelvis. Electronically Signed   By: Denny Flack M.D.   On: 12/18/2023 11:41   CT L-SPINE NO CHARGE Result Date: 12/18/2023 CLINICAL DATA:  Progressive bilateral leg pain, low back pain. HIV positive. Breast cancer. EXAM: CT LUMBAR SPINE WITHOUT CONTRAST TECHNIQUE: Multidetector CT imaging of the lumbar spine was performed without intravenous contrast administration. Multiplanar CT image reconstructions were also generated. RADIATION DOSE REDUCTION: This exam was performed according to the  departmental dose-optimization program which includes automated exposure control, adjustment of the mA and/or kV according to patient size and/or use of iterative reconstruction technique. COMPARISON:  None Available. FINDINGS: Segmentation: 5 lumbar type vertebrae. Alignment: Normal. Vertebrae: There are numerous lytic lesions seen throughout the lumbar spine in keeping with osseous metastatic disease. Additionally, lytic lesion within the right posterolateral T10 vertebral body extends into the right pedicle and is complicated by mild superior endplate fracture with minimal loss of height. No retropulsion. Lytic lesions are seen within the vertebral bodies of T11-L3 and S1 and S3 vertebral bodies. No associated pathologic fracture noted with these lesions. Paraspinal and other soft tissues: Infiltrative fluid or soft tissue seen within retroperitoneum, better described on accompanying CT examination of the abdomen pelvis and favored to represent inflammatory changes related to acute pancreatitis. No loculated fluid collection identified. No paraspinal fluid collection identified. No canal hematoma. Disc levels: Intervertebral disc heights are preserved. No high-grade canal stenosis. Multifactorial mild central canal stenosis is noted, however, at L3-4 and L4-5 secondary 2 lever hypertrophy and mild broad-based disc bulges. No high-grade neuroforaminal narrowing. IMPRESSION: 1. Numerous lytic lesions throughout the lumbar spine in keeping with osseous metastatic disease. 2. Lytic lesion within the right posterolateral T10 vertebral body extends into the right pedicle and is complicated by mild superior endplate fracture with minimal loss of height. No retropulsion. 3. Infiltrative fluid or soft tissue seen within the retroperitoneum, better described on accompanying CT examination of the abdomen pelvis and favored to represent inflammatory changes related to acute pancreatitis. Electronically Signed   By: Worthy Heads M.D.   On: 12/18/2023 03:20   CT ABDOMEN PELVIS W CONTRAST Result Date: 12/18/2023 CLINICAL DATA:  Abdominal pain, flank pain, low back pain. Breast cancer. HIV positive. * Tracking Code: BO * EXAM: CT ABDOMEN AND PELVIS WITH CONTRAST TECHNIQUE: Multidetector CT imaging of the abdomen and pelvis was performed using the standard protocol following bolus administration of intravenous contrast. RADIATION DOSE REDUCTION: This exam was performed according to the departmental dose-optimization program which includes automated exposure control, adjustment of the mA and/or kV according to patient size and/or use of iterative reconstruction technique. CONTRAST:  OMNIPAQUE  IOHEXOL  300 MG/ML  SOLN COMPARISON:  08/23/2021 FINDINGS: Lower chest: No acute abnormality. Small hiatal hernia. Fluid within the visualized distal esophagus may reflect changes of gastroesophageal reflux. Hepatobiliary: Moderate hepatomegaly. No focal intrahepatic mass identified. No intra or extrahepatic biliary ductal dilation. Gallbladder unremarkable. Pancreas: There is infiltrative fluid within the retroperitoneum surrounding the head and uncinate process of the pancreas, second and third portion the duodenum, and tracking inferiorly along the great vessels and right ureter. This likely reflects changes acute pancreatitis involving the head and uncinate process pancreas though this can be seen, less commonly, with infectious or inflammatory duodenitis, or infiltrative  retroperitoneal malignancy such as lymphoma or inflammatory conditions such as IgG4 related retroperitoneal fibrosis. Normal enhancement of the pancreatic parenchyma. The pancreatic duct is not dilated. No loculated peripancreatic fluid collections are identified. Spleen: Normal in size without focal abnormality. Adrenals/Urinary Tract: Adrenal glands are unremarkable. Kidneys are normal, without renal calculi, focal lesion, or hydronephrosis. Bladder is unremarkable.  Stomach/Bowel: Stomach is within normal limits. Appendix appears normal. No evidence of bowel wall thickening, distention, or inflammatory changes. Vascular/Lymphatic: As noted above, the infiltrative disease within retroperitoneum tracks around the abdominal aorta and inferior vena cava the level of the aortic bifurcation without significant mass effect vessels. The abdominal vasculature is otherwise unremarkable. No pathologic adenopathy within the abdomen and pelvis. Reproductive: The uterus is lobulated and enlarged keeping with changes of uterine fibroids. Exophytic fibroids extend into the 8 necks of bilaterally. The pelvic organs are otherwise unremarkable. Other: No abdominal wall hernia or abnormality. No abdominopelvic ascites. Musculoskeletal: Multiple lytic lesions have developed within the axial skeleton, new since prior examination, in keeping with osseous metastatic disease. Lytic lesion within the T10 vertebral body is present with associated superior endplate fracture of T10 and mild loss of height. No retropulsion. Additional lytic lesions are seen within the right pedicle of T11 vertebral bodies T11-L3 and within the S1 vertebral body. Lytic lesion is seen within the left acetabulum with erosion of the medial wall of the acetabulum. No superimposed pathologic fracture or dislocation. IMPRESSION: 1. Infiltrative fluid within the retroperitoneum surrounding the head and uncinate process of the pancreas, second and third portion the duodenum, and tracking inferiorly along the great vessels and right ureter. This likely reflects changes of acute interstitial/edematous pancreatitis, though additional considerations are listed above. Correlation with serum amylase and lipase would be helpful in confirming this 2. Multiple lytic lesions have developed within the axial skeleton, new since prior examination, in keeping with development of osseous metastatic disease. Associated superior endplate fracture  T10 without retropulsion. Erosion of the medial wall of the left acetabulum without evidence pathologic fracture or dislocation 3. Moderate hepatomegaly. 4. Small hiatal hernia. Fluid within the visualized distal esophagus may reflect changes of gastroesophageal reflux. 5. Fibroid uterus. Electronically Signed   By: Worthy Heads M.D.   On: 12/18/2023 03:11

## 2023-12-18 NOTE — ED Notes (Signed)
 Only able to obtain one aerobic blood culture sample from patient's hand. Patient is left arm save and a difficult stick. Attempted two other times from two different sites without success. Dr. Monique Ano made aware

## 2023-12-18 NOTE — ED Notes (Signed)
 Received call from [facility] will arrive to get patient within 10-15mins, ED Nurse is aware Received call @ 05:13 am

## 2023-12-18 NOTE — ED Provider Notes (Signed)
 Briana [name] is a 48 y.o. female who presents to the Emergency Department complaining of back pain and weakness.  She has a history of HIV, stage III breast cancer status post lumpectomy, radiation and chemotherapy.  She recently had her tissue expander removed and is currently completing a course of antibiotics for infection in the pocket.  Her last dose of antibiotics is today.  She complains of bilateral lower extremity stiffness for 1 week with bilateral low back pain that started today.  No fever or chest pain.  She does feel like this takes her breath away.  No prior similar symptoms.  Patient is tachycardic, appears uncomfortable.  She does have mild weakness in bilateral lower extremities, left greater than right.  CBC with leukocytosis, sodium is mildly low at 132.  UA is not consistent with UTI.  Given her recent infection, tachycardia and back pain a CT abdomen pelvis was obtained as MRI is not available at this facility.  CT scan is concerning for possible pancreatitis, will add on a lipase.  CT also demonstrates multiple new lytic lesions concerning for metastatic process.  Discussed with patient findings of studies.  Given her symptoms recommend that she get admitted for ongoing care.  Dr. [facility] Lavender with [facility] consulted for admission for further evaluation and management.  Plan to transfer to the [facility] emergency department to expedite MRI for additional workup.  Discussed with Dr. Morris Arch and the [facility] emergency department.   Kelsey Patricia, MD 12/18/23 [phone]

## 2023-12-18 NOTE — Progress Notes (Signed)
   12/18/23 1536  TOC Brief Assessment  Insurance and Status Reviewed  Patient has primary care physician Yes (Dr. Waddell Mon)  Home environment has been reviewed Home alone  Prior level of function: Independent  Prior/Current Home Services No current home services  Social Drivers of Health Review SDOH reviewed no interventions necessary  Readmission risk has been reviewed Yes  Transition of care needs no transition of care needs at this time

## 2023-12-18 NOTE — Progress Notes (Signed)
 Prior-To-Admission Oral Chemotherapy for Treatment of Oncologic Disease   Order noted from Dr. Bonita Bussing to continue prior-to-admission oral chemotherapy regimen of capecitabine .  Procedure Per Pharmacy & Therapeutics Committee Policy: Orders for continuation of home oral chemotherapy for treatment of an oncologic disease will be held unless approved by an oncologist during current admission.    For patients receiving oncology care at [facility], inpatient pharmacist contacts patient's oncologist during regular office hours to review. If earlier review is medically necessary, attending physician consults [facility] on-call oncologist   For patients receiving oncology care outside of [facility], attending physician consults patient's oncologist to review. If this oncologist or their coverage cannot be reached, attending physician consults [facility] on-call oncologist   Oral chemotherapy continuation order is on hold pending oncologist review, [facility] oncologist Dr. Maria Shiner will be notified by inpatient pharmacy during office hours    Shireen Dory, PharmD 12/18/2023, 8:34 PM

## 2023-12-18 NOTE — H&P (Signed)
 History and Physical    Patient: Briana [name] ZOX:[id number] DOB: 03-02-1976 DOA: 12/17/2023 DOS: the patient was seen and examined on 12/18/2023 PCP: Everlina Hock, NP  Patient coming from: Home  Chief Complaint:  Chief Complaint  Patient presents with   Back Pain   HPI: Briana [name] is a 48 y.o. female with medical history significant of abnormal Pap smear, status post colposcopy, anemia, chronic kidney disease, history of HIV infection, hypertension, stage IV breast cancer who presented to the emergency department with back pain.  No history of trauma.  No lower extremity numbness, no fecal or urinary incontinence. He denied fever, chills, rhinorrhea, sore throat, wheezing or hemoptysis.  No chest pain, palpitations, diaphoresis, PND, orthopnea or pitting edema of the lower extremities.  No abdominal pain, nausea, emesis, diarrhea, constipation, melena or hematochezia.  No flank pain, dysuria, frequency or hematuria.  No polyuria, polydipsia, polyphagia or blurred vision.   Lab work: Urine pregnancy test was negative, urinalysis was normal.  CBC showed a white count 11.5, hemoglobin 9.3 g/dL and platelets 811.  Normal lipase and lactic acid.  BMP showed a sodium 132 and CO2 18 mmol/L with a normal anion gap.  Glucose was 108 mg/dL, the rest of the BMP results were normal.  LFTs with total protein 8.2 and albumin 2.8 g/dL.  Alkaline phosphatase was 143 units/L.  Imaging: CT abdomen/pelvis with contrast questionable pancreatitis.  However, lipase level was normal.  There was multiple lytic lesions within the atrial skeleton that is new since prior examination.  Moderate hepatomegaly.  Small hiatal hernia.  Fibroid uterus.  ED course: Initial vital signs were temperature 99.3 F, pulse 131, respiration 18, BP 120/81 mmHg O2 sat 100% on room air.  The patient received fentanyl  50 mcg x 3, oxycodone  5 mg p.o. x 1, LR 1000 mL liter bolus and normal saline 1000 mL liter bolus.   Review of  Systems: As mentioned in the history of present illness. All other systems reviewed and are negative. Past Medical History:  Diagnosis Date   Abnormal Pap smear    s/p colposcopy    Anemia    Breast cancer (HCC) 12/2022   Breast cancer metastasized to axillary lymph node, left (HCC) 02/12/2023   Chronic kidney disease 03/01/2013   kidney infection   History of radiation therapy    left chest wall-07/23/23-09/15/23- Dr. Retta Caster   HIV infection [employer] [facility] [employer])    Hypertension    Screening for malignant neoplasm of the cervix    Past Surgical History:  Procedure Laterality Date   BREAST BIOPSY Left 01/20/2023   US  LT BREAST BX W LOC DEV 1ST LESION IMG BX SPEC US  GUIDE 01/20/2023 [facility]   BREAST BIOPSY Left 06/04/2023   US  LT RADIOACTIVE SEED LOC 06/04/2023 [facility]   BREAST RECONSTRUCTION WITH PLACEMENT OF TISSUE EXPANDER AND ALLODERM Left 06/08/2023   Procedure: LEFT BREAST RECONSTRUCTION WITH PLACEMENT OF TISSUE EXPANDER AND ALLODERM;  Surgeon: Alger Infield, MD;  Location: [facility];  Service: Plastics;  Laterality: Left;   CAPSULECTOMY Left 12/04/2023   Procedure: COMPLETE BREAST CAPSULECTOMY;  Surgeon: Alger Infield, MD;  Location: [facility];  Service: Plastics;  Laterality: Left;   CESAREAN SECTION     2000/2008   IR IMAGING GUIDED PORT INSERTION  02/06/2023   MASTECTOMY WITH AXILLARY LYMPH NODE DISSECTION Left 06/08/2023   Procedure: LEFT MASTECTOMY WITH TARGETED LYMPH NODE DISSECTION;  Surgeon: Oza Blumenthal, MD;  Location: [facility];  Service: General;  Laterality: Left;  LMA PEC BLOCK   REMOVAL OF TISSUE EXPANDER Left 12/04/2023   Procedure: REMOVAL, TISSUE EXPANDER;  Surgeon: Alger Infield, MD;  Location: [facility];  Service: Plastics;  Laterality: Left;   TUBAL LIGATION     Social History:  reports that she has never smoked. She has never used smokeless tobacco. She reports that she does  not drink alcohol and does not use drugs.  Allergies  Allergen Reactions   Pumpkin Flavoring Agent (Non-Screening) Swelling    ONLY Lip swelling after eating pumpkin pie    Family History  Problem Relation Age of Onset   Eczema Mother    Diabetes Father    Hypertension Father    Cancer Maternal Aunt    Diabetes Maternal Grandmother    Hypertension Maternal Grandmother    Diabetes Maternal Grandfather    Hypertension Maternal Grandfather    Diabetes Paternal Grandmother    Hypertension Paternal Grandmother    Diabetes Paternal Grandfather    Hypertension Paternal Grandfather     Prior to Admission medications   Medication Sig Start Date End Date Taking? Authorizing Provider  amLODipine  (NORVASC ) 10 MG tablet TAKE 1 TABLET(10 MG) BY MOUTH DAILY 09/28/23   Minna Amass B, NP  bictegravir-emtricitabine -tenofovir  AF (BIKTARVY ) 50-200-25 MG TABS tablet TAKE 1 TABLET DAILY 01/08/23   Comer, Judithann Novas, MD  capecitabine  (XELODA ) 500 MG tablet Take 4 tablets (2,000 mg total) by mouth 2 (two) times daily after a meal. Take within 30 minutes after meals. Take for 14 days on, then off for 7 days. Repeat every 21 days. Patient not taking: Reported on 12/17/2023 09/22/23   Ivor Mars, MD  DUPIXENT 300 MG/2ML SOAJ Inject into the skin. Every 14 days. Patient not taking: Reported on 12/17/2023 09/07/23   [provider]  ferrous sulfate  325 (65 FE) MG EC tablet Take 325 mg by mouth daily with breakfast.    [provider]  hydrochlorothiazide  (HYDRODIURIL ) 12.5 MG tablet Take 1 tablet (12.5 mg total) by mouth daily. 02/11/23   Everlina Hock, NP  HYDROcodone -acetaminophen  (NORCO) 5-325 MG tablet Take 1 tablet by mouth every 4 (four) hours as needed for severe pain (pain score 7-10). Patient not taking: Reported on 12/17/2023 12/04/23   Alger Infield, MD  megestrol  (MEGACE  ES) 625 MG/5ML suspension Take 5 mLs (625 mg total) by mouth daily. 12/02/23   Ivor Mars, MD   methocarbamol  (ROBAXIN ) 500 MG tablet Take 1 tablet (500 mg total) by mouth every 8 (eight) hours as needed for muscle spasms. 12/04/23   Alger Infield, MD  potassium chloride  SA (KLOR-CON  M) 20 MEQ tablet Take 2 tablets (40 mEq total) by mouth 2 (two) times daily. 08/02/23   Jerilynn Montenegro, MD  sulfamethoxazole -trimethoprim  (BACTRIM  DS) 800-160 MG tablet Take 1 tablet by mouth 2 (two) times daily. Patient not taking: Reported on 12/17/2023 12/04/23   Alger Infield, MD  triamcinolone  (KENALOG ) 0.025 % ointment Apply topically 2 (two) times daily as needed. 10/14/23   Lina Render, MD    Physical Exam: Vitals:   12/18/23 0300 12/18/23 0425 12/18/23 0500 12/18/23 0625  BP: (!) 128/90 (!) 135/97 130/88 134/83  Pulse: (!) 112  (!) 106 (!) 116  Resp: (!) 27 (!) 22 (!) 24 18  Temp:   98.1 F (36.7 C) 99 F (37.2 C)  TempSrc:   Oral Oral  SpO2: 97%  100% 100%   Physical Exam Vitals and nursing note reviewed.  Constitutional:  General: She is awake. She is not in acute distress.    Appearance: She is ill-appearing.  HENT:     Head: Normocephalic.     Nose: No rhinorrhea.  Eyes:     General: No scleral icterus.    Pupils: Pupils are equal, round, and reactive to light.  Neck:     Vascular: No JVD.  Cardiovascular:     Rate and Rhythm: Normal rate and regular rhythm.     Heart sounds: S1 normal and S2 normal.  Pulmonary:     Effort: Pulmonary effort is normal.     Breath sounds: Normal breath sounds. No wheezing, rhonchi or rales.  Abdominal:     General: Bowel sounds are normal.     Palpations: Abdomen is soft.     Tenderness: There is no abdominal tenderness. There is no guarding.  Musculoskeletal:     Cervical back: Neck supple.     Right lower leg: No edema.     Left lower leg: No edema.  Skin:    General: Skin is warm and dry.  Neurological:     General: No focal deficit present.     Mental Status: She is alert and oriented to person, place, and time.   Psychiatric:        Mood and Affect: Mood normal.        Behavior: Behavior normal. Behavior is cooperative.     Data Reviewed:  Results are pending, will review when available. EKG: Vent. rate 117 BPM PR interval 119 ms QRS duration 74 ms QT/QTcB 319/445 ms P-R-T axes 2 62 56 Sinus tachycardia Consider left ventricular hypertrophy  Assessment and Plan: Principal Problem:   Lytic bone lesions on xray In the setting of:   Stage IV breast cancer in female (HCC) Observation/MedSurg. Subsequently updated to inpatient. Continue IV fluids. Analgesics as needed. Antiemetics as needed. Oncology consult appreciated. Patient is on Xeloda  and Megace .  Active Problems:   Hyponatremia Secondary to diuretic use. Tachycardic and hypertensive. Will hold HCTZ today. Hold amlodipine  to avoid reflex tachycardia. Begin metoprolol  succinate 25 mg p.o. daily.    Human immunodeficiency virus (HIV) disease (HCC) Continue Biktarvy  1 tablet p.o. daily.    Iron  deficiency anemia Monitor hematocrit and hemoglobin. Continue ferrous sulfate .    Thrombocytosis In the setting of anemia and malignancy.    HTN (hypertension) Continue hydrochlorothiazide  12.5 mg p.o. daily.    Moderate protein malnutrition (HCC) In the setting of anemia and malignancy. May benefit from protein supplementation. Consider nutritional services evaluation. Follow-up albumin level.     Advance Care Planning:   Code Status: Full Code   Consults: Oncology.  Family Communication:   Severity of Illness: The appropriate patient status for this patient is OBSERVATION. Observation status is judged to be reasonable and necessary in order to provide the required intensity of service to ensure the patient's safety. The patient's presenting symptoms, physical exam findings, and initial radiographic and laboratory data in the context of their medical condition is felt to place them at decreased risk for further  clinical deterioration. Furthermore, it is anticipated that the patient will be medically stable for discharge from the hospital within 2 midnights of admission.   Author: Danice Dural, MD 12/18/2023 9:01 AM  For on call review [email or web address].   This document was prepared using Dragon voice recognition software and may contain some unintended transcription errors.

## 2023-12-19 ENCOUNTER — Inpatient Hospital Stay (HOSPITAL_COMMUNITY)

## 2023-12-19 DIAGNOSIS — M898X9 Other specified disorders of bone, unspecified site: Secondary | ICD-10-CM | POA: Diagnosis not present

## 2023-12-19 LAB — CBC
HCT: 31.4 % — ABNORMAL LOW (ref 36.0–46.0)
Hemoglobin: 9.6 g/dL — ABNORMAL LOW (ref 12.0–15.0)
MCH: 27.3 pg (ref 26.0–34.0)
MCHC: 30.6 g/dL (ref 30.0–36.0)
MCV: 89.2 fL (ref 80.0–100.0)
Platelets: 407 10*3/uL — ABNORMAL HIGH (ref 150–400)
RBC: 3.52 MIL/uL — ABNORMAL LOW (ref 3.87–5.11)
RDW: 17.6 % — ABNORMAL HIGH (ref 11.5–15.5)
WBC: 9.6 10*3/uL (ref 4.0–10.5)
nRBC: 0 % (ref 0.0–0.2)

## 2023-12-19 LAB — COMPREHENSIVE METABOLIC PANEL WITH GFR
ALT: 8 U/L (ref 0–44)
AST: 11 U/L — ABNORMAL LOW (ref 15–41)
Albumin: 2.4 g/dL — ABNORMAL LOW (ref 3.5–5.0)
Alkaline Phosphatase: 116 U/L (ref 38–126)
Anion gap: 9 (ref 5–15)
BUN: 11 mg/dL (ref 6–20)
CO2: 20 mmol/L — ABNORMAL LOW (ref 22–32)
Calcium: 8.7 mg/dL — ABNORMAL LOW (ref 8.9–10.3)
Chloride: 103 mmol/L (ref 98–111)
Creatinine, Ser: 0.71 mg/dL (ref 0.44–1.00)
GFR, Estimated: >60 mL/min (ref >60)
Glucose, Bld: 83 mg/dL (ref 70–99)
Potassium: 3.8 mmol/L (ref 3.5–5.1)
Sodium: 132 mmol/L — ABNORMAL LOW (ref 135–145)
Total Bilirubin: 0.7 mg/dL (ref 0.0–1.2)
Total Protein: 7.4 g/dL (ref 6.5–8.1)

## 2023-12-19 MED ORDER — ZOLEDRONIC ACID 4 MG/100ML IV SOLN
4.0000 mg | Freq: Once | INTRAVENOUS | Status: AC
  Administered 2023-12-19: 4 mg via INTRAVENOUS
  Filled 2023-12-19: qty 100

## 2023-12-19 MED ORDER — IOHEXOL 300 MG/ML  SOLN
100.0000 mL | Freq: Once | INTRAMUSCULAR | Status: AC | PRN
  Administered 2023-12-19: 100 mL via INTRAVENOUS

## 2023-12-19 MED ORDER — BISACODYL 10 MG RE SUPP: 10.0000 mg | Freq: Every day | RECTAL | Status: DC | PRN

## 2023-12-19 MED ORDER — POLYETHYLENE GLYCOL 3350 17 G PO PACK: 17.0000 g | PACK | Freq: Every day | ORAL | Status: DC | PRN

## 2023-12-19 MED ORDER — KETOROLAC TROMETHAMINE 30 MG/ML IJ SOLN
30.0000 mg | Freq: Three times a day (TID) | INTRAMUSCULAR | Status: DC
Start: 2023-12-19 — End: 2023-12-21
  Administered 2023-12-19 – 2023-12-20 (×4): 30 mg via INTRAVENOUS
  Filled 2023-12-19 (×4): qty 1

## 2023-12-19 MED ORDER — SODIUM CHLORIDE (PF) 0.9 % IJ SOLN
INTRAMUSCULAR | Status: AC
  Filled 2023-12-19: qty 50

## 2023-12-19 MED ORDER — SENNOSIDES-DOCUSATE SODIUM 8.6-50 MG PO TABS
1.0000 | ORAL_TABLET | Freq: Two times a day (BID) | ORAL | Status: DC
  Administered 2023-12-19 – 2023-12-20 (×3): 1 via ORAL
  Filled 2023-12-19 (×3): qty 1

## 2023-12-19 NOTE — Progress Notes (Signed)
 PT Cancellation Note  Patient Details Name: LEVETTE PAULICK MRN: [id number] DOB: Feb 23, 1976   Cancelled Treatment:    Reason Eval/Treat Not Completed: Other (comment) Awaiting results of CT Left Hip   Kati L Payson 12/19/2023, 1:11 PM Blanch Bunde, DPT Physical Therapist Acute Rehabilitation Services Office: [phone]

## 2023-12-19 NOTE — Progress Notes (Signed)
 PROGRESS NOTE    Briana [name]  ZOX:[id number] DOB: 1975/10/12 DOA: 12/17/2023 PCP: Everlina Hock, NP   Brief Narrative:  48 year old female with history of left breast cancer s/p mastectomy followed by radiation and chemoimmunotherapy, HIV on antiretroviral therapy, chronic anemia, hypertension presented with worsening back pain.  On presentation, CT of abdomen/pelvis with contrast showed questionable pancreatitis however lipase level was normal.  Guidewire multiple lytic lesions within the atrial skeleton, new since prior examination.  She was started on IV fluids and analgesics.  Oncology was consulted.  Assessment & Plan:   Severe back pain Multiple lytic bony lesions in the thoracic and lumbar spine, most likely metastatic Probable T10 compression fracture Stage IV breast cancer -Presented with worsening back pain.  CT abdomen/pelvis as above.  MRI of thoracic and lumbar spine showed multiple lytic lesions in the thoracic and lumbar spine, most likely metastatic along with probable T10 compression fracture. - Continue current pain management.  Pain is improving.  PT eval. - Oncology following: Oncology has ordered CT of left hip to rule out any impending pathological fracture  Hyponatremia - Mild.  Encourage oral intake.  Monitor.  Hydrochlorothiazide  on hold.  Acute metabolic acidosis Mild.  Improving.  Monitor.  Encourage oral intake  HIV - Continue Biktarvy .  Outpatient follow-up with ID  Leukocytosis -Resolved  Anemia of chronic disease Iron  deficiency anemia -From from cancer and chronic illnesses.  Hemoglobin currently stable.  No signs of bleeding.  Continue ferrous sulfate   Thrombocytosis - Possibly reactive.  Monitor intermittently  Hypertension - Continue metoprolol .  Hold hydrochlorothiazide .  Blood pressure on the lower side intermittently.  Moderate protein calorie malnutrition -consult nutrition.  Continue Megace .   DVT prophylaxis: Lovenox  Code  Status: Full Family Communication: Mother at bedside Disposition Plan: Status is: Inpatient Remains inpatient appropriate because: Of severity of illness  Consultants: Oncology  Procedures: None  Antimicrobials: None   Subjective: Patient seen and examined at bedside.  Pain is slightly better controlled.  No fever, vomiting, chest pain reported.  Objective: Vitals:   12/18/23 1510 12/18/23 1815 12/18/23 2101 12/19/23 0455  BP:  (!) 122/91 106/70 114/76  Pulse:  (!) 115 (!) 110   Resp:  16 16 18   Temp:  98.4 F (36.9 C) 98.9 F (37.2 C) 98.7 F (37.1 C)  TempSrc:  Oral Oral Oral  SpO2:  100% 100% 99%  Weight: 62.7 kg     Height: 5\' 5"  (1.651 m)       Intake/Output Summary (Last 24 hours) at 12/19/2023 1028 Last data filed at 12/19/2023 0946 Gross per 24 hour  Intake 240 ml  Output --  Net 240 ml   Filed Weights   12/18/23 1510  Weight: 62.7 kg    Examination:  General exam: Appears calm and comfortable  Respiratory system: Bilateral decreased breath sounds at bases Cardiovascular system: S1 & S2 heard, tachycardic gastrointestinal system: Abdomen is nondistended, soft and nontender. Normal bowel sounds heard. Extremities: No cyanosis, clubbing, edema  Central nervous system: Alert and oriented. No focal neurological deficits. Moving extremities Skin: No rashes, lesions or ulcers Psychiatry: Judgement and insight appear normal. Mood & affect appropriate.     Data Reviewed: I have personally reviewed following labs and imaging studies  CBC: Recent Labs  Lab 12/17/23 2340 12/19/23 0445  WBC 11.5* 9.6  NEUTROABS 9.6*  --   HGB 9.3* 9.6*  HCT 29.0* 31.4*  MCV 85.3 89.2  PLT 405* 407*   Basic Metabolic Panel: Recent Labs  Lab 12/17/23 2340 12/19/23 0445  NA 132* 132*  K 3.7 3.8  CL 105 103  CO2 18* 20*  GLUCOSE 108* 83  BUN 13 11  CREATININE 0.69 0.71  CALCIUM 8.9 8.7*   GFR: Estimated Creatinine Clearance: 79.9 mL/min (by C-G formula based  on SCr of 0.71 mg/dL). Liver Function Tests: Recent Labs  Lab 12/17/23 2340 12/19/23 0445  AST 15 11*  ALT 9 8  ALKPHOS 143* 116  BILITOT 0.3 0.7  PROT 8.2* 7.4  ALBUMIN 2.8* 2.4*   Recent Labs  Lab 12/17/23 2340  LIPASE 27   No results for input(s): "AMMONIA" in the last 168 hours. Coagulation Profile: No results for input(s): "INR", "PROTIME" in the last 168 hours. Cardiac Enzymes: No results for input(s): "CKTOTAL", "CKMB", "CKMBINDEX", "TROPONINI" in the last 168 hours. BNP (last 3 results) No results for input(s): "PROBNP" in the last 8760 hours. HbA1C: No results for input(s): "HGBA1C" in the last 72 hours. CBG: No results for input(s): "GLUCAP" in the last 168 hours. Lipid Profile: No results for input(s): "CHOL", "HDL", "LDLCALC", "TRIG", "CHOLHDL", "LDLDIRECT" in the last 72 hours. Thyroid  Function Tests: No results for input(s): "TSH", "T4TOTAL", "FREET4", "T3FREE", "THYROIDAB" in the last 72 hours. Anemia Panel: No results for input(s): "VITAMINB12", "FOLATE", "FERRITIN", "TIBC", "IRON ", "RETICCTPCT" in the last 72 hours. Sepsis Labs: Recent Labs  Lab 12/18/23 0046  LATICACIDVEN 0.6    Recent Results (from the past 240 hours)  Culture, blood (routine x 2)     Status: None (Preliminary result)   Collection Time: 12/18/23 12:45 AM   Specimen: BLOOD RIGHT HAND  Result Value Ref Range Status   Specimen Description   Final    BLOOD RIGHT HAND Performed at [facility], [address] [place], [place] [address]    Special Requests   Final    BOTTLES DRAWN AEROBIC ONLY Blood Culture results may not be optimal due to an inadequate volume of blood received in culture bottles Performed at [facility], [address] [place], [place] [address]    Culture   Final    NO GROWTH < 24 HOURS Performed at [facility], [address], [place], [place] [address]    Report Status PENDING  Incomplete         Radiology Studies: MR  Lumbar Spine W Wo Contrast Result Date: 12/18/2023 CLINICAL DATA:  Chronic myelopathy thoracic and lumbar spine. Bilateral lower back pain and stiffness in both legs. History of stage III breast cancer status post lumpectomy radiation and chemotherapy. Completing course of antibiotics for infection in tissue expander pocket. EXAM: MRI THORACIC AND LUMBAR SPINE WITHOUT AND WITH CONTRAST TECHNIQUE: Multiplanar and multiecho pulse sequences of the thoracic and lumbar spine were obtained without and with intravenous contrast. CONTRAST:  6mL GADAVIST  GADOBUTROL  1 MMOL/ML IV SOLN COMPARISON:  CT lumbar spine same day. FINDINGS: MRI THORACIC SPINE FINDINGS Alignment:  Thoracic kyphosis is maintained.  No listhesis. Vertebrae: Diffuse signal abnormality throughout the T10 vertebral body with edema and diffuse enhancement. There is extension of signal abnormality into the right pedicle with possible expansion of the pedicle as well as signal abnormality and enhancement extending into the pars interarticularis and facets on the right. Irregularity of the T10 superior endplate with minimal height loss. Slight bowing of the posterior cortex of the T10 vertebral body without significant retropulsion appreciated. STIR hyperintense lesion in the T2 vertebral body without definite enhancement. Additional enhancing lesion in the left  inferior aspect of T4. Enhancing lesion in the left posterosuperior aspect of T8 with involvement of the left pedicle. T1 and T2 hyperintense lesion in the T5 vertebral body suggestive of hemangioma. Irregularity of the T6 inferior endplate with mild edema without evidence of focal lesion, likely degenerative. Focal irregularity of the T11 inferior endplate likely reflecting Schmorl's node. More masslike signal abnormality and enhancement in the left anterior aspect of T11 likely reflecting osseous lesion. Small enhancing lesion in the superior aspect of T12. Cord:  Normal signal and morphology.  Paraspinal and other soft tissues: The visualized paraspinal soft tissues are unremarkable. There is no evidence of abnormal epidural enhancement or epidural fluid collection. Suggestion of heterogeneous opacities in the bilateral lower lobes. Trace bilateral pleural effusions. Disc levels: Disc heights are maintained. Mild disc desiccation at multiple levels in the midthoracic spine. There is no large disc herniation or high-grade spinal canal stenosis. Facet arthrosis at multiple levels. Mild foraminal stenosis on the left at T6-7. Additional mild-to-moderate foraminal stenosis on the right at T10-11. MRI LUMBAR SPINE FINDINGS Segmentation:  Standard. Alignment: Lumbar lordosis is maintained. No significant listhesis. Vertebrae: STIR hyperintense enhancing lesion in the left aspect of the L1 vertebral body. Similar smaller appearing lesion in the right aspect of the L2 vertebral body and in the left anterior inferior aspect of L4. Prominent focus of signal abnormality and enhancement in the S1 vertebra with a cystic focus anteriorly. Additional partially visualized signal abnormality and enhancement involving the S3 vertebra. Additional signal abnormality and enhancement in the L4 spinous process. Conus medullaris: Extends to the L1-2 level and appears normal. The visualized conus medullaris is normal in caliber and signal intensity. Normal appearance of the cauda equina nerve roots. Linear enhancement along the ventral aspect of the lower thoracic cord and conus medullaris is likely vascular in etiology. No nodular or masslike enhancement noted. Paraspinal and other soft tissues: There is edema in the paraspinal musculature adjacent to the bilateral L4-5 facets likely secondary to facet arthrosis. There is also signal abnormality and likely edema throughout the visualized retroperitoneum without evidence of associated enhancement. Disc levels: Minimal disc bulges at L1-2 and L2-3 without significant spinal canal  stenosis. Disc bulge and facet arthrosis at L3-4. There is mild lateral recess narrowing at L3-4 without significant spinal canal stenosis. Disc desiccation at L4-5 with a diffuse disc bulge and central disc protrusion resulting in lateral recess narrowing. Moderate facet arthrosis at L4-5. Mild spinal canal stenosis at L4-5. IMPRESSION: Multiple enhancing lesions throughout the thoracic and lumbar spine as well as the partially visualized sacrum concerning for osseous metastatic disease. Diffuse signal abnormality throughout the T10 vertebra with bowing of the posterior cortex, subtle irregularity of the superior endplate and minimal height loss concerning for pathologic compression fracture. No significant retropulsion. Degenerative changes of the thoracic and lumbar spine as above. No high-grade spinal canal stenosis. Facet arthrosis at multiple levels most pronounced at L4-5. Diffuse edema throughout the retroperitoneum likely related to pancreatitis better evaluated on same day CT abdomen pelvis. Electronically Signed   By: Denny Flack M.D.   On: 12/18/2023 11:41   MR THORACIC SPINE W WO CONTRAST Result Date: 12/18/2023 CLINICAL DATA:  Chronic myelopathy thoracic and lumbar spine. Bilateral lower back pain and stiffness in both legs. History of stage III breast cancer status post lumpectomy radiation and chemotherapy. Completing course of antibiotics for infection in tissue expander pocket. EXAM: MRI THORACIC AND LUMBAR SPINE WITHOUT AND WITH CONTRAST TECHNIQUE: Multiplanar and multiecho pulse sequences of  the thoracic and lumbar spine were obtained without and with intravenous contrast. CONTRAST:  6mL GADAVIST  GADOBUTROL  1 MMOL/ML IV SOLN COMPARISON:  CT lumbar spine same day. FINDINGS: MRI THORACIC SPINE FINDINGS Alignment:  Thoracic kyphosis is maintained.  No listhesis. Vertebrae: Diffuse signal abnormality throughout the T10 vertebral body with edema and diffuse enhancement. There is extension of  signal abnormality into the right pedicle with possible expansion of the pedicle as well as signal abnormality and enhancement extending into the pars interarticularis and facets on the right. Irregularity of the T10 superior endplate with minimal height loss. Slight bowing of the posterior cortex of the T10 vertebral body without significant retropulsion appreciated. STIR hyperintense lesion in the T2 vertebral body without definite enhancement. Additional enhancing lesion in the left inferior aspect of T4. Enhancing lesion in the left posterosuperior aspect of T8 with involvement of the left pedicle. T1 and T2 hyperintense lesion in the T5 vertebral body suggestive of hemangioma. Irregularity of the T6 inferior endplate with mild edema without evidence of focal lesion, likely degenerative. Focal irregularity of the T11 inferior endplate likely reflecting Schmorl's node. More masslike signal abnormality and enhancement in the left anterior aspect of T11 likely reflecting osseous lesion. Small enhancing lesion in the superior aspect of T12. Cord:  Normal signal and morphology. Paraspinal and other soft tissues: The visualized paraspinal soft tissues are unremarkable. There is no evidence of abnormal epidural enhancement or epidural fluid collection. Suggestion of heterogeneous opacities in the bilateral lower lobes. Trace bilateral pleural effusions. Disc levels: Disc heights are maintained. Mild disc desiccation at multiple levels in the midthoracic spine. There is no large disc herniation or high-grade spinal canal stenosis. Facet arthrosis at multiple levels. Mild foraminal stenosis on the left at T6-7. Additional mild-to-moderate foraminal stenosis on the right at T10-11. MRI LUMBAR SPINE FINDINGS Segmentation:  Standard. Alignment: Lumbar lordosis is maintained. No significant listhesis. Vertebrae: STIR hyperintense enhancing lesion in the left aspect of the L1 vertebral body. Similar smaller appearing lesion  in the right aspect of the L2 vertebral body and in the left anterior inferior aspect of L4. Prominent focus of signal abnormality and enhancement in the S1 vertebra with a cystic focus anteriorly. Additional partially visualized signal abnormality and enhancement involving the S3 vertebra. Additional signal abnormality and enhancement in the L4 spinous process. Conus medullaris: Extends to the L1-2 level and appears normal. The visualized conus medullaris is normal in caliber and signal intensity. Normal appearance of the cauda equina nerve roots. Linear enhancement along the ventral aspect of the lower thoracic cord and conus medullaris is likely vascular in etiology. No nodular or masslike enhancement noted. Paraspinal and other soft tissues: There is edema in the paraspinal musculature adjacent to the bilateral L4-5 facets likely secondary to facet arthrosis. There is also signal abnormality and likely edema throughout the visualized retroperitoneum without evidence of associated enhancement. Disc levels: Minimal disc bulges at L1-2 and L2-3 without significant spinal canal stenosis. Disc bulge and facet arthrosis at L3-4. There is mild lateral recess narrowing at L3-4 without significant spinal canal stenosis. Disc desiccation at L4-5 with a diffuse disc bulge and central disc protrusion resulting in lateral recess narrowing. Moderate facet arthrosis at L4-5. Mild spinal canal stenosis at L4-5. IMPRESSION: Multiple enhancing lesions throughout the thoracic and lumbar spine as well as the partially visualized sacrum concerning for osseous metastatic disease. Diffuse signal abnormality throughout the T10 vertebra with bowing of the posterior cortex, subtle irregularity of the superior endplate and minimal height loss  concerning for pathologic compression fracture. No significant retropulsion. Degenerative changes of the thoracic and lumbar spine as above. No high-grade spinal canal stenosis. Facet arthrosis at  multiple levels most pronounced at L4-5. Diffuse edema throughout the retroperitoneum likely related to pancreatitis better evaluated on same day CT abdomen pelvis. Electronically Signed   By: Denny Flack M.D.   On: 12/18/2023 11:41   CT L-SPINE NO CHARGE Result Date: 12/18/2023 CLINICAL DATA:  Progressive bilateral leg pain, low back pain. HIV positive. Breast cancer. EXAM: CT LUMBAR SPINE WITHOUT CONTRAST TECHNIQUE: Multidetector CT imaging of the lumbar spine was performed without intravenous contrast administration. Multiplanar CT image reconstructions were also generated. RADIATION DOSE REDUCTION: This exam was performed according to the departmental dose-optimization program which includes automated exposure control, adjustment of the mA and/or kV according to patient size and/or use of iterative reconstruction technique. COMPARISON:  None Available. FINDINGS: Segmentation: 5 lumbar type vertebrae. Alignment: Normal. Vertebrae: There are numerous lytic lesions seen throughout the lumbar spine in keeping with osseous metastatic disease. Additionally, lytic lesion within the right posterolateral T10 vertebral body extends into the right pedicle and is complicated by mild superior endplate fracture with minimal loss of height. No retropulsion. Lytic lesions are seen within the vertebral bodies of T11-L3 and S1 and S3 vertebral bodies. No associated pathologic fracture noted with these lesions. Paraspinal and other soft tissues: Infiltrative fluid or soft tissue seen within retroperitoneum, better described on accompanying CT examination of the abdomen pelvis and favored to represent inflammatory changes related to acute pancreatitis. No loculated fluid collection identified. No paraspinal fluid collection identified. No canal hematoma. Disc levels: Intervertebral disc heights are preserved. No high-grade canal stenosis. Multifactorial mild central canal stenosis is noted, however, at L3-4 and L4-5 secondary  2 lever hypertrophy and mild broad-based disc bulges. No high-grade neuroforaminal narrowing. IMPRESSION: 1. Numerous lytic lesions throughout the lumbar spine in keeping with osseous metastatic disease. 2. Lytic lesion within the right posterolateral T10 vertebral body extends into the right pedicle and is complicated by mild superior endplate fracture with minimal loss of height. No retropulsion. 3. Infiltrative fluid or soft tissue seen within the retroperitoneum, better described on accompanying CT examination of the abdomen pelvis and favored to represent inflammatory changes related to acute pancreatitis. Electronically Signed   By: Worthy Heads M.D.   On: 12/18/2023 03:20   CT ABDOMEN PELVIS W CONTRAST Result Date: 12/18/2023 CLINICAL DATA:  Abdominal pain, flank pain, low back pain. Breast cancer. HIV positive. * Tracking Code: BO * EXAM: CT ABDOMEN AND PELVIS WITH CONTRAST TECHNIQUE: Multidetector CT imaging of the abdomen and pelvis was performed using the standard protocol following bolus administration of intravenous contrast. RADIATION DOSE REDUCTION: This exam was performed according to the departmental dose-optimization program which includes automated exposure control, adjustment of the mA and/or kV according to patient size and/or use of iterative reconstruction technique. CONTRAST:  OMNIPAQUE  IOHEXOL  300 MG/ML  SOLN COMPARISON:  08/23/2021 FINDINGS: Lower chest: No acute abnormality. Small hiatal hernia. Fluid within the visualized distal esophagus may reflect changes of gastroesophageal reflux. Hepatobiliary: Moderate hepatomegaly. No focal intrahepatic mass identified. No intra or extrahepatic biliary ductal dilation. Gallbladder unremarkable. Pancreas: There is infiltrative fluid within the retroperitoneum surrounding the head and uncinate process of the pancreas, second and third portion the duodenum, and tracking inferiorly along the great vessels and right ureter. This likely  reflects changes acute pancreatitis involving the head and uncinate process pancreas though this can be seen, less commonly, with infectious  or inflammatory duodenitis, or infiltrative retroperitoneal malignancy such as lymphoma or inflammatory conditions such as IgG4 related retroperitoneal fibrosis. Normal enhancement of the pancreatic parenchyma. The pancreatic duct is not dilated. No loculated peripancreatic fluid collections are identified. Spleen: Normal in size without focal abnormality. Adrenals/Urinary Tract: Adrenal glands are unremarkable. Kidneys are normal, without renal calculi, focal lesion, or hydronephrosis. Bladder is unremarkable. Stomach/Bowel: Stomach is within normal limits. Appendix appears normal. No evidence of bowel wall thickening, distention, or inflammatory changes. Vascular/Lymphatic: As noted above, the infiltrative disease within retroperitoneum tracks around the abdominal aorta and inferior vena cava the level of the aortic bifurcation without significant mass effect vessels. The abdominal vasculature is otherwise unremarkable. No pathologic adenopathy within the abdomen and pelvis. Reproductive: The uterus is lobulated and enlarged keeping with changes of uterine fibroids. Exophytic fibroids extend into the 8 necks of bilaterally. The pelvic organs are otherwise unremarkable. Other: No abdominal wall hernia or abnormality. No abdominopelvic ascites. Musculoskeletal: Multiple lytic lesions have developed within the axial skeleton, new since prior examination, in keeping with osseous metastatic disease. Lytic lesion within the T10 vertebral body is present with associated superior endplate fracture of T10 and mild loss of height. No retropulsion. Additional lytic lesions are seen within the right pedicle of T11 vertebral bodies T11-L3 and within the S1 vertebral body. Lytic lesion is seen within the left acetabulum with erosion of the medial wall of the acetabulum. No superimposed  pathologic fracture or dislocation. IMPRESSION: 1. Infiltrative fluid within the retroperitoneum surrounding the head and uncinate process of the pancreas, second and third portion the duodenum, and tracking inferiorly along the great vessels and right ureter. This likely reflects changes of acute interstitial/edematous pancreatitis, though additional considerations are listed above. Correlation with serum amylase and lipase would be helpful in confirming this 2. Multiple lytic lesions have developed within the axial skeleton, new since prior examination, in keeping with development of osseous metastatic disease. Associated superior endplate fracture M84 without retropulsion. Erosion of the medial wall of the left acetabulum without evidence pathologic fracture or dislocation 3. Moderate hepatomegaly. 4. Small hiatal hernia. Fluid within the visualized distal esophagus may reflect changes of gastroesophageal reflux. 5. Fibroid uterus. Electronically Signed   By: Worthy Heads M.D.   On: 12/18/2023 03:11        Scheduled Meds:  bictegravir-emtricitabine -tenofovir  AF  1 tablet Oral Daily   Chlorhexidine  Gluconate Cloth  6 each Topical Daily   enoxaparin  (LOVENOX ) injection  40 mg Subcutaneous Q24H   feeding supplement  237 mL Oral BID BM   ferrous sulfate   325 mg Oral Q breakfast   hydrochlorothiazide   12.5 mg Oral Daily   ketorolac   30 mg Intravenous Q8H   megestrol   625 mg Oral Daily   metoprolol  succinate  25 mg Oral Daily   potassium chloride  SA  20 mEq Oral BID   sodium chloride  flush  10-40 mL Intracatheter Q12H   Continuous Infusions:        Audria Leather, MD [facility] 12/19/2023, 10:28 AM

## 2023-12-20 DIAGNOSIS — M898X9 Other specified disorders of bone, unspecified site: Secondary | ICD-10-CM | POA: Diagnosis not present

## 2023-12-20 LAB — CBC WITH DIFFERENTIAL/PLATELET
Abs Immature Granulocytes: 0.03 10*3/uL (ref 0.00–0.07)
Basophils Absolute: 0 10*3/uL (ref 0.0–0.1)
Basophils Relative: 0 %
Eosinophils Absolute: 0.6 10*3/uL — ABNORMAL HIGH (ref 0.0–0.5)
Eosinophils Relative: 8 %
HCT: 27.3 % — ABNORMAL LOW (ref 36.0–46.0)
Hemoglobin: 8.6 g/dL — ABNORMAL LOW (ref 12.0–15.0)
Immature Granulocytes: 0 %
Lymphocytes Relative: 10 %
Lymphs Abs: 0.8 10*3/uL (ref 0.7–4.0)
MCH: 27.6 pg (ref 26.0–34.0)
MCHC: 31.5 g/dL (ref 30.0–36.0)
MCV: 87.5 fL (ref 80.0–100.0)
Monocytes Absolute: 0.4 10*3/uL (ref 0.1–1.0)
Monocytes Relative: 6 %
Neutro Abs: 5.8 10*3/uL (ref 1.7–7.7)
Neutrophils Relative %: 76 %
Platelets: 387 10*3/uL (ref 150–400)
RBC: 3.12 MIL/uL — ABNORMAL LOW (ref 3.87–5.11)
RDW: 17.1 % — ABNORMAL HIGH (ref 11.5–15.5)
WBC: 7.6 10*3/uL (ref 4.0–10.5)
nRBC: 0 % (ref 0.0–0.2)

## 2023-12-20 LAB — COMPREHENSIVE METABOLIC PANEL WITH GFR
ALT: 8 U/L (ref 0–44)
AST: 12 U/L — ABNORMAL LOW (ref 15–41)
Albumin: 2.4 g/dL — ABNORMAL LOW (ref 3.5–5.0)
Alkaline Phosphatase: 100 U/L (ref 38–126)
Anion gap: 8 (ref 5–15)
BUN: 28 mg/dL — ABNORMAL HIGH (ref 6–20)
CO2: 19 mmol/L — ABNORMAL LOW (ref 22–32)
Calcium: 7.9 mg/dL — ABNORMAL LOW (ref 8.9–10.3)
Chloride: 108 mmol/L (ref 98–111)
Creatinine, Ser: 0.91 mg/dL (ref 0.44–1.00)
GFR, Estimated: >60 mL/min (ref >60)
Glucose, Bld: 109 mg/dL — ABNORMAL HIGH (ref 70–99)
Potassium: 3.5 mmol/L (ref 3.5–5.1)
Sodium: 135 mmol/L (ref 135–145)
Total Bilirubin: <0.2 mg/dL (ref 0.0–1.2)
Total Protein: 6.8 g/dL (ref 6.5–8.1)

## 2023-12-20 LAB — CANCER ANTIGEN 27.29: CA 27.29: 13.1 U/mL (ref 0.0–38.6)

## 2023-12-20 LAB — MAGNESIUM: Magnesium: 2 mg/dL (ref 1.7–2.4)

## 2023-12-20 MED ORDER — HEPARIN SOD (PORK) LOCK FLUSH 100 UNIT/ML IV SOLN
500.0000 [IU] | Freq: Once | INTRAVENOUS | Status: AC
  Administered 2023-12-20: 500 [IU] via INTRAVENOUS
  Filled 2023-12-20: qty 5

## 2023-12-20 MED ORDER — SENNOSIDES-DOCUSATE SODIUM 8.6-50 MG PO TABS: 1.0000 | ORAL_TABLET | Freq: Two times a day (BID) | ORAL | 0 refills | Status: AC

## 2023-12-20 MED ORDER — TRAMADOL HCL 50 MG PO TABS: 50.0000 mg | ORAL_TABLET | Freq: Four times a day (QID) | ORAL | 0 refills | Status: DC | PRN

## 2023-12-20 MED ORDER — POLYETHYLENE GLYCOL 3350 17 G PO PACK
17.0000 g | PACK | Freq: Every day | ORAL | 0 refills | Status: AC | PRN
Start: 2023-12-20 — End: ?

## 2023-12-20 MED ORDER — HYDROCODONE-ACETAMINOPHEN 5-325 MG PO TABS: 1.0000 | ORAL_TABLET | Freq: Four times a day (QID) | ORAL | 0 refills | Status: DC | PRN

## 2023-12-20 MED ORDER — POTASSIUM CHLORIDE CRYS ER 20 MEQ PO TBCR: 20.0000 meq | EXTENDED_RELEASE_TABLET | Freq: Two times a day (BID) | ORAL | Status: AC

## 2023-12-20 NOTE — Plan of Care (Signed)
  Problem: Education: Goal: Knowledge of General Education information will improve Description: Including pain rating scale, medication(s)/side effects and non-pharmacologic comfort measures Outcome: Progressing   Problem: Clinical Measurements: Goal: Will remain free from infection Outcome: Progressing Goal: Cardiovascular complication will be avoided Outcome: Progressing   Problem: Nutrition: Goal: Adequate nutrition will be maintained Outcome: Progressing   Problem: Coping: Goal: Level of anxiety will decrease Outcome: Progressing   Problem: Elimination: Goal: Will not experience complications related to bowel motility Outcome: Progressing Goal: Will not experience complications related to urinary retention Outcome: Progressing   Problem: Pain Managment: Goal: General experience of comfort will improve and/or be controlled Outcome: Progressing   Problem: Safety: Goal: Ability to remain free from injury will improve Outcome: Progressing   Problem: Skin Integrity: Goal: Risk for impaired skin integrity will decrease Outcome: Progressing

## 2023-12-20 NOTE — Discharge Summary (Signed)
 Physician Discharge Summary  Briana [name] ZOX:[id number] DOB: [birth year] DOA: 12/17/2023  PCP: Everlina Hock, NP  Admit date: 12/17/2023 Discharge date: 12/20/2023  Admitted From: Home Disposition: Home  Recommendations for Outpatient Follow-up:  Follow up with PCP in 1 week with repeat CBC/BMP Outpatient follow-up with oncology Follow up in ED if symptoms worsen or new appear   Home Health: No Equipment/Devices: None  Discharge Condition: Stable CODE STATUS: Full Diet recommendation: Heart healthy  Brief/Interim Summary: 48 year old female with history of left breast cancer s/p mastectomy followed by radiation and chemoimmunotherapy, HIV on antiretroviral therapy, chronic anemia, hypertension presented with worsening back pain. On presentation, CT of abdomen/pelvis with contrast showed questionable pancreatitis however lipase level was normal. Guidewire multiple lytic lesions within the atrial skeleton, new since prior examination. She was started on IV fluids and analgesics. Oncology was consulted.  Oncology recommended CT of the left hip which showed lytic lesion in the left acetabulum with no acute fracture currently.  Her pain is better controlled and she feels okay to go home today.  Oncology has cleared the patient for discharge.  Discharge plan home today.  Discharge Diagnoses:   Severe back pain Multiple lytic bony lesions in the thoracic and lumbar spine, most likely metastatic Probable T10 compression fracture Stage IV breast cancer -Presented with worsening back pain.  CT abdomen/pelvis as above.  MRI of thoracic and lumbar spine showed multiple lytic lesions in the thoracic and lumbar spine, most likely metastatic along with probable T10 compression fracture. - Continue current pain management.  Pain is improving.   - Oncology following: CT of the left hip showed lytic lesion in the left acetabulum with increased risk of fracture but no acute fracture currently. -Her  pain is better controlled and she feels okay to go home today.  Oncology has cleared the patient for discharge.  Discharge plan home today.   Hyponatremia - Improved.   Acute metabolic acidosis -Mild.  Outpatient follow-up.  Encourage oral intake   HIV - Continue Biktarvy .  Outpatient follow-up with ID   Leukocytosis -Resolved   Anemia of chronic disease Iron  deficiency anemia -From from cancer and chronic illnesses.  Hemoglobin currently stable.  No signs of bleeding.  Continue ferrous sulfate    Thrombocytosis - Possibly reactive.  Monitor intermittently as an outpatient   Hypertension -Resume amlodipine .  Hold hydrochlorothiazide  since blood pressure is intermittently on the lower side.  Moderate protein calorie malnutrition -Continue Megace .    Discharge Instructions  Discharge Instructions     Diet - low sodium heart healthy   Complete by: As directed    Increase activity slowly   Complete by: As directed       Allergies as of 12/20/2023       Reactions   Pumpkin Flavoring Agent (non-screening) Swelling   ONLY Lip swelling after eating pumpkin pie        Medication List     STOP taking these medications    capecitabine  500 MG tablet Commonly known as: Xeloda    Dupixent 300 MG/2ML Soaj Generic drug: Dupilumab   hydrochlorothiazide  12.5 MG tablet Commonly known as: HYDRODIURIL    sulfamethoxazole -trimethoprim  800-160 MG tablet Commonly known as: Bactrim  DS       TAKE these medications    amLODipine  10 MG tablet Commonly known as: NORVASC  TAKE 1 TABLET(10 MG) BY MOUTH DAILY   Biktarvy  50-200-25 MG Tabs tablet Generic drug: bictegravir-emtricitabine -tenofovir  AF TAKE 1 TABLET DAILY   ferrous sulfate  325 (65 FE) MG EC tablet Take  325 mg by mouth daily with breakfast.   HYDROcodone -acetaminophen  5-325 MG tablet Commonly known as: Norco Take 1 tablet by mouth every 6 (six) hours as needed for severe pain (pain score 7-10). What changed:  when to take this   megestrol  625 MG/5ML suspension Commonly known as: MEGACE  ES Take 5 mLs (625 mg total) by mouth daily.   methocarbamol  500 MG tablet Commonly known as: ROBAXIN  Take 1 tablet (500 mg total) by mouth every 8 (eight) hours as needed for muscle spasms. What changed: when to take this   polyethylene glycol 17 g packet Commonly known as: MIRALAX  / GLYCOLAX  Take 17 g by mouth daily as needed for moderate constipation.   potassium chloride  SA 20 MEQ tablet Commonly known as: KLOR-CON  M Take 1 tablet (20 mEq total) by mouth 2 (two) times daily.   senna-docusate 8.6-50 MG tablet Commonly known as: Senokot-S Take 1 tablet by mouth 2 (two) times daily.   traMADol  50 MG tablet Commonly known as: Ultram  Take 1 tablet (50 mg total) by mouth every 6 (six) hours as needed for moderate pain (pain score 4-6).   triamcinolone  0.025 % ointment Commonly known as: KENALOG  Apply topically 2 (two) times daily as needed. What changed:  how much to take when to take this reasons to take this        Follow-up Information     Everlina Hock, NP. Schedule an appointment as soon as possible for a visit in 1 week(s).   Specialties: Family Medicine, Emergency Medicine Contact information: [address] [place] [address] [phone]         Ivor Mars, MD Follow up.   Specialty: Oncology Why: Next week Contact information: [address] [place] [address] [phone]                Allergies  Allergen Reactions   Pumpkin Flavoring Agent (Non-Screening) Swelling    ONLY Lip swelling after eating pumpkin pie    Consultations: Oncology   Procedures/Studies: CT HIP LEFT W WO CONTRAST Result Date: 12/19/2023 CLINICAL DATA:  Limping. Acute hip pain. Is there impending pathologic fracture? History of stage III breast cancer. Known thoracic and lumbar spine metastases. T10 pathologic compression fracture.  EXAM: CT OF THE LOWER LEFT EXTREMITY WITHOUT CONTRAST TECHNIQUE: Multidetector CT imaging of the lower left extremity was performed following the standard protocol before and during bolus administration of intravenous contrast. RADIATION DOSE REDUCTION: This exam was performed according to the departmental dose-optimization program which includes automated exposure control, adjustment of the mA and/or kV according to patient size and/or use of iterative reconstruction technique. CONTRAST:  OMNIPAQUE  IOHEXOL  300 MG/ML  SOLN COMPARISON:  CT abdomen and pelvis 12/18/2023 FINDINGS: Bones/Joint/Cartilage There is a lytic lesion seen within the superomedial aspect of the left acetabulum in a region measuring up to approximately 9 x 25 x 38 mm (transverse by AP by craniocaudal). There is also additional permeative lucency suspicious for metastatic disease involving within the far posterior acetabulum that is less well defined (axial series 2, image 91). This again has eroded the medial cortex at the superomedial aspect of the acetabulum in a region measuring up to approximately 2.8 cm in AP dimension (axial series 2, image 90 and joint 2 cm in craniocaudal dimension (coronal series 3, image 80). There is again a lucent lesion within the anterior right S1 vertebral body measuring up to approximately 1.7 x 2.3 x 2.0 cm (transverse  by AP by craniocaudal). Mild bilateral sacroiliac joint space narrowing with vacuum phenomenon and anterior endplate osteophytosis. Ligaments Suboptimally assessed by CT. Muscles and Tendons Normal size and density of the regional musculature. Soft tissues Heterogeneous, multilobular leiomyomatous uterus again noted. Surgical clips within the right hemiabdomen mesentery inferior to the gallbladder and within the anterior mesenteric fat just to the left of midline. IMPRESSION: 1. There is a lytic lesion within the superomedial aspect of the left acetabulum in a region measuring up to  approximately 9 x 25 x 38 mm (TR x AP x CC). Erosion of the medial acetabular wall cortex does increase susceptibility for pathological fracture. No acute fracture is seen at this time. 2. There is again a lucent lesion within the anterior right S1 vertebral body measuring up to approximately 1.7 x 2.3 x 2.0 cm (TR x AP x CC). Electronically Signed   By: Bertina Broccoli M.D.   On: 12/19/2023 16:44   MR Lumbar Spine W Wo Contrast Result Date: 12/18/2023 CLINICAL DATA:  Chronic myelopathy thoracic and lumbar spine. Bilateral lower back pain and stiffness in both legs. History of stage III breast cancer status post lumpectomy radiation and chemotherapy. Completing course of antibiotics for infection in tissue expander pocket. EXAM: MRI THORACIC AND LUMBAR SPINE WITHOUT AND WITH CONTRAST TECHNIQUE: Multiplanar and multiecho pulse sequences of the thoracic and lumbar spine were obtained without and with intravenous contrast. CONTRAST:  6mL GADAVIST  GADOBUTROL  1 MMOL/ML IV SOLN COMPARISON:  CT lumbar spine same day. FINDINGS: MRI THORACIC SPINE FINDINGS Alignment:  Thoracic kyphosis is maintained.  No listhesis. Vertebrae: Diffuse signal abnormality throughout the T10 vertebral body with edema and diffuse enhancement. There is extension of signal abnormality into the right pedicle with possible expansion of the pedicle as well as signal abnormality and enhancement extending into the pars interarticularis and facets on the right. Irregularity of the T10 superior endplate with minimal height loss. Slight bowing of the posterior cortex of the T10 vertebral body without significant retropulsion appreciated. STIR hyperintense lesion in the T2 vertebral body without definite enhancement. Additional enhancing lesion in the left inferior aspect of T4. Enhancing lesion in the left posterosuperior aspect of T8 with involvement of the left pedicle. T1 and T2 hyperintense lesion in the T5 vertebral body suggestive of hemangioma.  Irregularity of the T6 inferior endplate with mild edema without evidence of focal lesion, likely degenerative. Focal irregularity of the T11 inferior endplate likely reflecting Schmorl's node. More masslike signal abnormality and enhancement in the left anterior aspect of T11 likely reflecting osseous lesion. Small enhancing lesion in the superior aspect of T12. Cord:  Normal signal and morphology. Paraspinal and other soft tissues: The visualized paraspinal soft tissues are unremarkable. There is no evidence of abnormal epidural enhancement or epidural fluid collection. Suggestion of heterogeneous opacities in the bilateral lower lobes. Trace bilateral pleural effusions. Disc levels: Disc heights are maintained. Mild disc desiccation at multiple levels in the midthoracic spine. There is no large disc herniation or high-grade spinal canal stenosis. Facet arthrosis at multiple levels. Mild foraminal stenosis on the left at T6-7. Additional mild-to-moderate foraminal stenosis on the right at T10-11. MRI LUMBAR SPINE FINDINGS Segmentation:  Standard. Alignment: Lumbar lordosis is maintained. No significant listhesis. Vertebrae: STIR hyperintense enhancing lesion in the left aspect of the L1 vertebral body. Similar smaller appearing lesion in the right aspect of the L2 vertebral body and in the left anterior inferior aspect of L4. Prominent focus of signal abnormality and enhancement in the S1  vertebra with a cystic focus anteriorly. Additional partially visualized signal abnormality and enhancement involving the S3 vertebra. Additional signal abnormality and enhancement in the L4 spinous process. Conus medullaris: Extends to the L1-2 level and appears normal. The visualized conus medullaris is normal in caliber and signal intensity. Normal appearance of the cauda equina nerve roots. Linear enhancement along the ventral aspect of the lower thoracic cord and conus medullaris is likely vascular in etiology. No nodular or  masslike enhancement noted. Paraspinal and other soft tissues: There is edema in the paraspinal musculature adjacent to the bilateral L4-5 facets likely secondary to facet arthrosis. There is also signal abnormality and likely edema throughout the visualized retroperitoneum without evidence of associated enhancement. Disc levels: Minimal disc bulges at L1-2 and L2-3 without significant spinal canal stenosis. Disc bulge and facet arthrosis at L3-4. There is mild lateral recess narrowing at L3-4 without significant spinal canal stenosis. Disc desiccation at L4-5 with a diffuse disc bulge and central disc protrusion resulting in lateral recess narrowing. Moderate facet arthrosis at L4-5. Mild spinal canal stenosis at L4-5. IMPRESSION: Multiple enhancing lesions throughout the thoracic and lumbar spine as well as the partially visualized sacrum concerning for osseous metastatic disease. Diffuse signal abnormality throughout the T10 vertebra with bowing of the posterior cortex, subtle irregularity of the superior endplate and minimal height loss concerning for pathologic compression fracture. No significant retropulsion. Degenerative changes of the thoracic and lumbar spine as above. No high-grade spinal canal stenosis. Facet arthrosis at multiple levels most pronounced at L4-5. Diffuse edema throughout the retroperitoneum likely related to pancreatitis better evaluated on same day CT abdomen pelvis. Electronically Signed   By: Denny Flack M.D.   On: 12/18/2023 11:41   MR THORACIC SPINE W WO CONTRAST Result Date: 12/18/2023 CLINICAL DATA:  Chronic myelopathy thoracic and lumbar spine. Bilateral lower back pain and stiffness in both legs. History of stage III breast cancer status post lumpectomy radiation and chemotherapy. Completing course of antibiotics for infection in tissue expander pocket. EXAM: MRI THORACIC AND LUMBAR SPINE WITHOUT AND WITH CONTRAST TECHNIQUE: Multiplanar and multiecho pulse sequences of the  thoracic and lumbar spine were obtained without and with intravenous contrast. CONTRAST:  6mL GADAVIST  GADOBUTROL  1 MMOL/ML IV SOLN COMPARISON:  CT lumbar spine same day. FINDINGS: MRI THORACIC SPINE FINDINGS Alignment:  Thoracic kyphosis is maintained.  No listhesis. Vertebrae: Diffuse signal abnormality throughout the T10 vertebral body with edema and diffuse enhancement. There is extension of signal abnormality into the right pedicle with possible expansion of the pedicle as well as signal abnormality and enhancement extending into the pars interarticularis and facets on the right. Irregularity of the T10 superior endplate with minimal height loss. Slight bowing of the posterior cortex of the T10 vertebral body without significant retropulsion appreciated. STIR hyperintense lesion in the T2 vertebral body without definite enhancement. Additional enhancing lesion in the left inferior aspect of T4. Enhancing lesion in the left posterosuperior aspect of T8 with involvement of the left pedicle. T1 and T2 hyperintense lesion in the T5 vertebral body suggestive of hemangioma. Irregularity of the T6 inferior endplate with mild edema without evidence of focal lesion, likely degenerative. Focal irregularity of the T11 inferior endplate likely reflecting Schmorl's node. More masslike signal abnormality and enhancement in the left anterior aspect of T11 likely reflecting osseous lesion. Small enhancing lesion in the superior aspect of T12. Cord:  Normal signal and morphology. Paraspinal and other soft tissues: The visualized paraspinal soft tissues are unremarkable. There is no evidence  of abnormal epidural enhancement or epidural fluid collection. Suggestion of heterogeneous opacities in the bilateral lower lobes. Trace bilateral pleural effusions. Disc levels: Disc heights are maintained. Mild disc desiccation at multiple levels in the midthoracic spine. There is no large disc herniation or high-grade spinal canal  stenosis. Facet arthrosis at multiple levels. Mild foraminal stenosis on the left at T6-7. Additional mild-to-moderate foraminal stenosis on the right at T10-11. MRI LUMBAR SPINE FINDINGS Segmentation:  Standard. Alignment: Lumbar lordosis is maintained. No significant listhesis. Vertebrae: STIR hyperintense enhancing lesion in the left aspect of the L1 vertebral body. Similar smaller appearing lesion in the right aspect of the L2 vertebral body and in the left anterior inferior aspect of L4. Prominent focus of signal abnormality and enhancement in the S1 vertebra with a cystic focus anteriorly. Additional partially visualized signal abnormality and enhancement involving the S3 vertebra. Additional signal abnormality and enhancement in the L4 spinous process. Conus medullaris: Extends to the L1-2 level and appears normal. The visualized conus medullaris is normal in caliber and signal intensity. Normal appearance of the cauda equina nerve roots. Linear enhancement along the ventral aspect of the lower thoracic cord and conus medullaris is likely vascular in etiology. No nodular or masslike enhancement noted. Paraspinal and other soft tissues: There is edema in the paraspinal musculature adjacent to the bilateral L4-5 facets likely secondary to facet arthrosis. There is also signal abnormality and likely edema throughout the visualized retroperitoneum without evidence of associated enhancement. Disc levels: Minimal disc bulges at L1-2 and L2-3 without significant spinal canal stenosis. Disc bulge and facet arthrosis at L3-4. There is mild lateral recess narrowing at L3-4 without significant spinal canal stenosis. Disc desiccation at L4-5 with a diffuse disc bulge and central disc protrusion resulting in lateral recess narrowing. Moderate facet arthrosis at L4-5. Mild spinal canal stenosis at L4-5. IMPRESSION: Multiple enhancing lesions throughout the thoracic and lumbar spine as well as the partially visualized  sacrum concerning for osseous metastatic disease. Diffuse signal abnormality throughout the T10 vertebra with bowing of the posterior cortex, subtle irregularity of the superior endplate and minimal height loss concerning for pathologic compression fracture. No significant retropulsion. Degenerative changes of the thoracic and lumbar spine as above. No high-grade spinal canal stenosis. Facet arthrosis at multiple levels most pronounced at L4-5. Diffuse edema throughout the retroperitoneum likely related to pancreatitis better evaluated on same day CT abdomen pelvis. Electronically Signed   By: Denny Flack M.D.   On: 12/18/2023 11:41   CT L-SPINE NO CHARGE Result Date: 12/18/2023 CLINICAL DATA:  Progressive bilateral leg pain, low back pain. HIV positive. Breast cancer. EXAM: CT LUMBAR SPINE WITHOUT CONTRAST TECHNIQUE: Multidetector CT imaging of the lumbar spine was performed without intravenous contrast administration. Multiplanar CT image reconstructions were also generated. RADIATION DOSE REDUCTION: This exam was performed according to the departmental dose-optimization program which includes automated exposure control, adjustment of the mA and/or kV according to patient size and/or use of iterative reconstruction technique. COMPARISON:  None Available. FINDINGS: Segmentation: 5 lumbar type vertebrae. Alignment: Normal. Vertebrae: There are numerous lytic lesions seen throughout the lumbar spine in keeping with osseous metastatic disease. Additionally, lytic lesion within the right posterolateral T10 vertebral body extends into the right pedicle and is complicated by mild superior endplate fracture with minimal loss of height. No retropulsion. Lytic lesions are seen within the vertebral bodies of T11-L3 and S1 and S3 vertebral bodies. No associated pathologic fracture noted with these lesions. Paraspinal and other soft tissues: Infiltrative fluid or  soft tissue seen within retroperitoneum, better described  on accompanying CT examination of the abdomen pelvis and favored to represent inflammatory changes related to acute pancreatitis. No loculated fluid collection identified. No paraspinal fluid collection identified. No canal hematoma. Disc levels: Intervertebral disc heights are preserved. No high-grade canal stenosis. Multifactorial mild central canal stenosis is noted, however, at L3-4 and L4-5 secondary 2 lever hypertrophy and mild broad-based disc bulges. No high-grade neuroforaminal narrowing. IMPRESSION: 1. Numerous lytic lesions throughout the lumbar spine in keeping with osseous metastatic disease. 2. Lytic lesion within the right posterolateral T10 vertebral body extends into the right pedicle and is complicated by mild superior endplate fracture with minimal loss of height. No retropulsion. 3. Infiltrative fluid or soft tissue seen within the retroperitoneum, better described on accompanying CT examination of the abdomen pelvis and favored to represent inflammatory changes related to acute pancreatitis. Electronically Signed   By: Worthy Heads M.D.   On: 12/18/2023 03:20   CT ABDOMEN PELVIS W CONTRAST Result Date: 12/18/2023 CLINICAL DATA:  Abdominal pain, flank pain, low back pain. Breast cancer. HIV positive. * Tracking Code: BO * EXAM: CT ABDOMEN AND PELVIS WITH CONTRAST TECHNIQUE: Multidetector CT imaging of the abdomen and pelvis was performed using the standard protocol following bolus administration of intravenous contrast. RADIATION DOSE REDUCTION: This exam was performed according to the departmental dose-optimization program which includes automated exposure control, adjustment of the mA and/or kV according to patient size and/or use of iterative reconstruction technique. CONTRAST:  OMNIPAQUE  IOHEXOL  300 MG/ML  SOLN COMPARISON:  08/23/2021 FINDINGS: Lower chest: No acute abnormality. Small hiatal hernia. Fluid within the visualized distal esophagus may reflect changes of  gastroesophageal reflux. Hepatobiliary: Moderate hepatomegaly. No focal intrahepatic mass identified. No intra or extrahepatic biliary ductal dilation. Gallbladder unremarkable. Pancreas: There is infiltrative fluid within the retroperitoneum surrounding the head and uncinate process of the pancreas, second and third portion the duodenum, and tracking inferiorly along the great vessels and right ureter. This likely reflects changes acute pancreatitis involving the head and uncinate process pancreas though this can be seen, less commonly, with infectious or inflammatory duodenitis, or infiltrative retroperitoneal malignancy such as lymphoma or inflammatory conditions such as IgG4 related retroperitoneal fibrosis. Normal enhancement of the pancreatic parenchyma. The pancreatic duct is not dilated. No loculated peripancreatic fluid collections are identified. Spleen: Normal in size without focal abnormality. Adrenals/Urinary Tract: Adrenal glands are unremarkable. Kidneys are normal, without renal calculi, focal lesion, or hydronephrosis. Bladder is unremarkable. Stomach/Bowel: Stomach is within normal limits. Appendix appears normal. No evidence of bowel wall thickening, distention, or inflammatory changes. Vascular/Lymphatic: As noted above, the infiltrative disease within retroperitoneum tracks around the abdominal aorta and inferior vena cava the level of the aortic bifurcation without significant mass effect vessels. The abdominal vasculature is otherwise unremarkable. No pathologic adenopathy within the abdomen and pelvis. Reproductive: The uterus is lobulated and enlarged keeping with changes of uterine fibroids. Exophytic fibroids extend into the 8 necks of bilaterally. The pelvic organs are otherwise unremarkable. Other: No abdominal wall hernia or abnormality. No abdominopelvic ascites. Musculoskeletal: Multiple lytic lesions have developed within the axial skeleton, new since prior examination, in keeping  with osseous metastatic disease. Lytic lesion within the T10 vertebral body is present with associated superior endplate fracture of T10 and mild loss of height. No retropulsion. Additional lytic lesions are seen within the right pedicle of T11 vertebral bodies T11-L3 and within the S1 vertebral body. Lytic lesion is seen within the left acetabulum with erosion of  the medial wall of the acetabulum. No superimposed pathologic fracture or dislocation. IMPRESSION: 1. Infiltrative fluid within the retroperitoneum surrounding the head and uncinate process of the pancreas, second and third portion the duodenum, and tracking inferiorly along the great vessels and right ureter. This likely reflects changes of acute interstitial/edematous pancreatitis, though additional considerations are listed above. Correlation with serum amylase and lipase would be helpful in confirming this 2. Multiple lytic lesions have developed within the axial skeleton, new since prior examination, in keeping with development of osseous metastatic disease. Associated superior endplate fracture W09 without retropulsion. Erosion of the medial wall of the left acetabulum without evidence pathologic fracture or dislocation 3. Moderate hepatomegaly. 4. Small hiatal hernia. Fluid within the visualized distal esophagus may reflect changes of gastroesophageal reflux. 5. Fibroid uterus. Electronically Signed   By: Worthy Heads M.D.   On: 12/18/2023 03:11      Subjective: Patient seen and examined at bedside.  Denies chest pain, vomiting or fever.  Pain is improving.  Feels okay to go home today.  Discharge Exam: Vitals:   12/19/23 2016 12/20/23 0523  BP: 109/70 116/77  Pulse: 91 96  Resp: 16 18  Temp: 98 F (36.7 C) 98.5 F (36.9 C)  SpO2: 100% 98%    General: Pt is alert, awake, not in acute distress.  On room air. Cardiovascular: rate controlled, S1/S2 + Respiratory: bilateral decreased breath sounds at bases Abdominal: Soft, NT,  ND, bowel sounds + Extremities: no edema, no cyanosis    The results of significant diagnostics from this hospitalization (including imaging, microbiology, ancillary and laboratory) are listed below for reference.     Microbiology: Recent Results (from the past 240 hours)  Culture, blood (routine x 2)     Status: None (Preliminary result)   Collection Time: 12/18/23 12:45 AM   Specimen: BLOOD RIGHT HAND  Result Value Ref Range Status   Specimen Description   Final    BLOOD RIGHT HAND Performed at [facility], [address] [place], [place] [address]    Special Requests   Final    BOTTLES DRAWN AEROBIC ONLY Blood Culture results may not be optimal due to an inadequate volume of blood received in culture bottles Performed at [facility], [address] [place], [place] [address]    Culture   Final    NO GROWTH 2 DAYS Performed at [facility], [address] [place], [place] [address]    Report Status PENDING  Incomplete     Labs: BNP (last 3 results) No results for input(s): "BNP" in the last 8760 hours. Basic Metabolic Panel: Recent Labs  Lab 12/17/23 2340 12/19/23 0445 12/20/23 0532  NA 132* 132* 135  K 3.7 3.8 3.5  CL 105 103 108  CO2 18* 20* 19*  GLUCOSE 108* 83 109*  BUN 13 11 28*  CREATININE 0.69 0.71 0.91  CALCIUM 8.9 8.7* 7.9*  MG  --   --  2.0   Liver Function Tests: Recent Labs  Lab 12/17/23 2340 12/19/23 0445 12/20/23 0532  AST 15 11* 12*  ALT 9 8 8   ALKPHOS 143* 116 100  BILITOT 0.3 0.7 <0.2  PROT 8.2* 7.4 6.8  ALBUMIN 2.8* 2.4* 2.4*   Recent Labs  Lab 12/17/23 2340  LIPASE 27   No results for input(s): "AMMONIA" in the last 168 hours. CBC: Recent Labs  Lab 12/17/23 2340 12/19/23 0445 12/20/23 0532  WBC 11.5* 9.6 7.6  NEUTROABS  9.6*  --  5.8  HGB 9.3* 9.6* 8.6*  HCT 29.0* 31.4* 27.3*  MCV 85.3 89.2 87.5  PLT 405* 407* 387   Cardiac Enzymes: No results for input(s): "CKTOTAL", "CKMB",  "CKMBINDEX", "TROPONINI" in the last 168 hours. BNP: Invalid input(s): "POCBNP" CBG: No results for input(s): "GLUCAP" in the last 168 hours. D-Dimer No results for input(s): "DDIMER" in the last 72 hours. Hgb A1c No results for input(s): "HGBA1C" in the last 72 hours. Lipid Profile No results for input(s): "CHOL", "HDL", "LDLCALC", "TRIG", "CHOLHDL", "LDLDIRECT" in the last 72 hours. Thyroid  function studies No results for input(s): "TSH", "T4TOTAL", "T3FREE", "THYROIDAB" in the last 72 hours.  Invalid input(s): "FREET3" Anemia work up No results for input(s): "VITAMINB12", "FOLATE", "FERRITIN", "TIBC", "IRON ", "RETICCTPCT" in the last 72 hours. Urinalysis    Component Value Date/Time   COLORURINE YELLOW 12/17/2023 2340   APPEARANCEUR CLEAR 12/17/2023 2340   LABSPEC 1.025 12/17/2023 2340   PHURINE 6.0 12/17/2023 2340   GLUCOSEU NEGATIVE 12/17/2023 2340   HGBUR NEGATIVE 12/17/2023 2340   BILIRUBINUR NEGATIVE 12/17/2023 2340   KETONESUR NEGATIVE 12/17/2023 2340   PROTEINUR NEGATIVE 12/17/2023 2340   UROBILINOGEN 0.2 05/23/2014 1655   NITRITE NEGATIVE 12/17/2023 2340   LEUKOCYTESUR NEGATIVE 12/17/2023 2340   Sepsis Labs Recent Labs  Lab 12/17/23 2340 12/19/23 0445 12/20/23 0532  WBC 11.5* 9.6 7.6   Microbiology Recent Results (from the past 240 hours)  Culture, blood (routine x 2)     Status: None (Preliminary result)   Collection Time: 12/18/23 12:45 AM   Specimen: BLOOD RIGHT HAND  Result Value Ref Range Status   Specimen Description   Final    BLOOD RIGHT HAND Performed at [facility], [address] [place], [place] [address]    Special Requests   Final    BOTTLES DRAWN AEROBIC ONLY Blood Culture results may not be optimal due to an inadequate volume of blood received in culture bottles Performed at [facility], [address] [place], [place] [address]    Culture   Final    NO GROWTH 2 DAYS Performed at [facility],  [address], [place], [place] [address]    Report Status PENDING  Incomplete     Time coordinating discharge: 35 minutes  SIGNED:   Audria Leather, MD  [facility] 12/20/2023, 11:06 AM

## 2023-12-20 NOTE — Plan of Care (Signed)

## 2023-12-21 ENCOUNTER — Inpatient Hospital Stay (HOSPITAL_BASED_OUTPATIENT_CLINIC_OR_DEPARTMENT_OTHER): Admitting: Hematology & Oncology

## 2023-12-21 ENCOUNTER — Inpatient Hospital Stay

## 2023-12-21 ENCOUNTER — Encounter: Payer: Self-pay | Admitting: Hematology & Oncology

## 2023-12-21 ENCOUNTER — Telehealth: Payer: Self-pay

## 2023-12-21 ENCOUNTER — Encounter: Payer: Self-pay | Admitting: *Deleted

## 2023-12-21 VITALS — BP 107/76 | HR 115 | Temp 98.5°F | Resp 16 | Ht 65.0 in | Wt 139.0 lb

## 2023-12-21 DIAGNOSIS — C50912 Malignant neoplasm of unspecified site of left female breast: Secondary | ICD-10-CM

## 2023-12-21 DIAGNOSIS — Z171 Estrogen receptor negative status [ER-]: Secondary | ICD-10-CM | POA: Diagnosis not present

## 2023-12-21 DIAGNOSIS — Z1732 Human epidermal growth factor receptor 2 negative status: Secondary | ICD-10-CM | POA: Diagnosis not present

## 2023-12-21 DIAGNOSIS — C773 Secondary and unspecified malignant neoplasm of axilla and upper limb lymph nodes: Secondary | ICD-10-CM | POA: Diagnosis not present

## 2023-12-21 DIAGNOSIS — D509 Iron deficiency anemia, unspecified: Secondary | ICD-10-CM | POA: Diagnosis present

## 2023-12-21 DIAGNOSIS — C778 Secondary and unspecified malignant neoplasm of lymph nodes of multiple regions: Secondary | ICD-10-CM | POA: Diagnosis not present

## 2023-12-21 DIAGNOSIS — C7951 Secondary malignant neoplasm of bone: Secondary | ICD-10-CM | POA: Diagnosis present

## 2023-12-21 DIAGNOSIS — I517 Cardiomegaly: Secondary | ICD-10-CM | POA: Diagnosis not present

## 2023-12-21 DIAGNOSIS — Z79624 Long term (current) use of inhibitors of nucleotide synthesis: Secondary | ICD-10-CM | POA: Diagnosis not present

## 2023-12-21 DIAGNOSIS — C50919 Malignant neoplasm of unspecified site of unspecified female breast: Secondary | ICD-10-CM | POA: Diagnosis not present

## 2023-12-21 DIAGNOSIS — Z1722 Progesterone receptor negative status: Secondary | ICD-10-CM | POA: Diagnosis not present

## 2023-12-21 DIAGNOSIS — Z79899 Other long term (current) drug therapy: Secondary | ICD-10-CM | POA: Diagnosis not present

## 2023-12-21 LAB — CBC WITH DIFFERENTIAL (CANCER CENTER ONLY)
Abs Immature Granulocytes: 0.04 10*3/uL (ref 0.00–0.07)
Basophils Absolute: 0 10*3/uL (ref 0.0–0.1)
Basophils Relative: 1 %
Eosinophils Absolute: 0.4 10*3/uL (ref 0.0–0.5)
Eosinophils Relative: 6 %
HCT: 28.6 % — ABNORMAL LOW (ref 36.0–46.0)
Hemoglobin: 9 g/dL — ABNORMAL LOW (ref 12.0–15.0)
Immature Granulocytes: 1 %
Lymphocytes Relative: 12 %
Lymphs Abs: 0.9 10*3/uL (ref 0.7–4.0)
MCH: 27.2 pg (ref 26.0–34.0)
MCHC: 31.5 g/dL (ref 30.0–36.0)
MCV: 86.4 fL (ref 80.0–100.0)
Monocytes Absolute: 0.6 10*3/uL (ref 0.1–1.0)
Monocytes Relative: 8 %
Neutro Abs: 5.5 10*3/uL (ref 1.7–7.7)
Neutrophils Relative %: 72 %
Platelet Count: 421 10*3/uL — ABNORMAL HIGH (ref 150–400)
RBC: 3.31 MIL/uL — ABNORMAL LOW (ref 3.87–5.11)
RDW: 17.2 % — ABNORMAL HIGH (ref 11.5–15.5)
WBC Count: 7.5 10*3/uL (ref 4.0–10.5)
nRBC: 0 % (ref 0.0–0.2)

## 2023-12-21 LAB — CMP (CANCER CENTER ONLY)
ALT: 6 U/L (ref 0–44)
AST: 14 U/L — ABNORMAL LOW (ref 15–41)
Albumin: 3.6 g/dL (ref 3.5–5.0)
Alkaline Phosphatase: 112 U/L (ref 38–126)
Anion gap: 12 (ref 5–15)
BUN: 9 mg/dL (ref 6–20)
CO2: 21 mmol/L — ABNORMAL LOW (ref 22–32)
Calcium: 7.6 mg/dL — ABNORMAL LOW (ref 8.9–10.3)
Chloride: 104 mmol/L (ref 98–111)
Creatinine: 0.53 mg/dL (ref 0.44–1.00)
GFR, Estimated: >60 mL/min (ref >60)
Glucose, Bld: 77 mg/dL (ref 70–99)
Potassium: 3.3 mmol/L — ABNORMAL LOW (ref 3.5–5.1)
Sodium: 137 mmol/L (ref 135–145)
Total Bilirubin: 0.2 mg/dL (ref 0.0–1.2)
Total Protein: 7.6 g/dL (ref 6.5–8.1)

## 2023-12-21 LAB — RETICULOCYTES
Immature Retic Fract: 20 % — ABNORMAL HIGH (ref 2.3–15.9)
RBC.: 3.32 MIL/uL — ABNORMAL LOW (ref 3.87–5.11)
Retic Count, Absolute: 69.4 10*3/uL (ref 19.0–186.0)
Retic Ct Pct: 2.1 % (ref 0.4–3.1)

## 2023-12-21 LAB — FERRITIN: Ferritin: 525 ng/mL — ABNORMAL HIGH (ref 11–307)

## 2023-12-21 NOTE — Progress Notes (Signed)
 Is here Hematology and Oncology Follow Up Visit  Briana [name] [id number] April 18, 1976 48 y.o. 12/21/2023   Principle Diagnosis:  Stage IIIC (T4N3M0) - TRIPLE NEGATIVE - infiltrating ductal carcinoma of the left breast -metastatic to bone HIV (+)  Current Therapy:   Neoadjuvant  chemoimmunotherapy --- carboplatin /Taxol /pembrolizumab  ---s/p cycle #3 - start on 02/19/2023 S/p LEFT MRM - 06/08/2023 --  Residual breast cancer - 3/3 nodes (-) XRT -- Start on 07/22/2023 --completed on 09/15/2023 Xeloda  - s/p cycle #3 on 09/28/2023 Zometa  4 mg IV every 3 months-next dose to be given on 03/2024     Interim History:  Briana [name] is back for follow-up.  Shockingly, she was admitted to the hospital on Friday night.  She came in because of back pain.  She had MRI of the spine.  That showed metastatic disease to her spine.  She also had a CT of the left hip which showed a lytic lesion in the left acetabulum.  She got some Zometa  in the hospital.   Her CA 27.29 over the weekend was 13.1.  She has been on Xeloda  in the adjuvant setting..  She did have surgery to remove an implant from the left breast recently.  This surgery went pretty well.  She had been working without difficulty at [employer].  She has lost maybe a few pounds.  Her appetite is doing okay.  She did have a nice Easter weekend..  She has had no headache.  There is been no blurred vision.  I think we are going to have to get a PET scan on her.  I think she will need a CT of the chest to help further identify any metastatic disease.  In the hospital, she had a CT of the abdomen and pelvis which did not show any liver metastasis.  She will also need to have an MRI of the brain.  When we first started analyzing her tumor last year, the tumor was HER2 by FISH.  However, it was 2+ by IHC.    I just hate that we are seeing this recurrent so quickly.  Overall, I would have just had a performance status about ECOG 1.    Medications:  Current Outpatient Medications:    amLODipine  (NORVASC ) 10 MG tablet, TAKE 1 TABLET(10 MG) BY MOUTH DAILY, Disp: 90 tablet, Rfl: 0   bictegravir-emtricitabine -tenofovir  AF (BIKTARVY ) 50-200-25 MG TABS tablet, TAKE 1 TABLET DAILY, Disp: 30 tablet, Rfl: 11   capecitabine  (XELODA ) 500 MG tablet, Take 4 tablets (2,000 mg total) by mouth 2 (two) times daily after a meal. Take within 30 minutes after meals. Take for 14 days on, then off for 7 days. Repeat every 21 days. (Patient not taking: Reported on 12/17/2023), Disp: 112 tablet, Rfl: 4   ferrous sulfate  325 (65 FE) MG EC tablet, Take 325 mg by mouth daily with breakfast., Disp: , Rfl:    HYDROcodone -acetaminophen  (NORCO) 5-325 MG tablet, Take 1 tablet by mouth every 6 (six) hours as needed for severe pain (pain score 7-10)., Disp: 20 tablet, Rfl: 0   megestrol  (MEGACE  ES) 625 MG/5ML suspension, Take 5 mLs (625 mg total) by mouth daily., Disp: 150 mL, Rfl: 3   methocarbamol  (ROBAXIN ) 500 MG tablet, Take 1 tablet (500 mg total) by mouth every 8 (eight) hours as needed for muscle spasms. (Patient taking differently: Take 500 mg by mouth daily as needed for muscle spasms.), Disp: 20 tablet, Rfl: 0   polyethylene glycol (MIRALAX  / GLYCOLAX ) 17 g packet,  Take 17 g by mouth daily as needed for moderate constipation., Disp: 14 each, Rfl: 0   potassium chloride  SA (KLOR-CON  M) 20 MEQ tablet, Take 1 tablet (20 mEq total) by mouth 2 (two) times daily., Disp: , Rfl:    senna-docusate (SENOKOT-S) 8.6-50 MG tablet, Take 1 tablet by mouth 2 (two) times daily., Disp: 30 tablet, Rfl: 0   traMADol  (ULTRAM ) 50 MG tablet, Take 1 tablet (50 mg total) by mouth every 6 (six) hours as needed for moderate pain (pain score 4-6)., Disp: 20 tablet, Rfl: 0   triamcinolone  (KENALOG ) 0.025 % ointment, Apply topically 2 (two) times daily as needed. (Patient taking differently: Apply 1 Application topically as needed (eczema).), Disp: 454 g, Rfl: 5  Allergies:   Allergies  Allergen Reactions   Pumpkin Flavoring Agent (Non-Screening) Swelling    ONLY Lip swelling after eating pumpkin pie    Past Medical History, Surgical history, Social history, and Family History were reviewed and updated.  Review of Systems: Review of Systems  Constitutional: Negative.   HENT:  Negative.    Eyes: Negative.   Respiratory: Negative.    Cardiovascular: Negative.   Gastrointestinal: Negative.   Endocrine: Negative.   Genitourinary: Negative.    Musculoskeletal: Negative.   Skin: Negative.   Neurological: Negative.   Hematological: Negative.   Psychiatric/Behavioral: Negative.      Physical Exam: Temperature is 98.5.  Pulse 115.  Blood pressure 1 7/76.  Weight is 139 pounds.    Wt Readings from Last 3 Encounters:  12/21/23 139 lb (63 kg)  12/18/23 138 lb 3.7 oz (62.7 kg)  12/17/23 138 lb (62.6 kg)    Physical Exam Vitals reviewed.  Constitutional:      Comments: Right breast shows no masses edema or erythema.  There is no right axillary adenopathy.  Left breast really does not show any masses.  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  There is some slight skin breakdown.  There is no exudate.  I cannot palpate any distinct mass in the left breast.   I cannot palpate any left axillary lymph nodes.    HENT:     Head: Normocephalic and atraumatic.  Eyes:     Pupils: Pupils are equal, round, and reactive to light.  Cardiovascular:     Rate and Rhythm: Normal rate and regular rhythm.     Heart sounds: Normal heart sounds.  Pulmonary:     Effort: Pulmonary effort is normal.     Breath sounds: Normal breath sounds.  Abdominal:     General: Bowel sounds are normal.     Palpations: Abdomen is soft.  Musculoskeletal:        General: No tenderness or deformity. Normal range of motion.     Cervical back: Normal range of motion.  Lymphadenopathy:     Cervical: No cervical adenopathy.  Skin:    General: Skin is warm and dry.      Findings: No erythema or rash.  Neurological:     Mental Status: She is alert and oriented to person, place, and time.  Psychiatric:        Behavior: Behavior normal.        Thought Content: Thought content normal.        Judgment: Judgment normal.     Lab Results  Component Value Date   WBC 7.6 12/20/2023   HGB 8.6 (L) 12/20/2023   HCT 27.3 (L) 12/20/2023   MCV 87.5 12/20/2023  PLT 387 12/20/2023     Chemistry      Component Value Date/Time   NA 135 12/20/2023 0532   K 3.5 12/20/2023 0532   CL 108 12/20/2023 0532   CO2 19 (L) 12/20/2023 0532   BUN 28 (H) 12/20/2023 0532   CREATININE 0.91 12/20/2023 0532   CREATININE 0.61 12/02/2023 0818   CREATININE 0.62 10/17/2022 0947      Component Value Date/Time   CALCIUM 7.9 (L) 12/20/2023 0532   ALKPHOS 100 12/20/2023 0532   AST 12 (L) 12/20/2023 0532   AST 11 (L) 12/02/2023 0818   ALT 8 12/20/2023 0532   ALT 7 12/02/2023 0818   BILITOT <0.2 12/20/2023 0532   BILITOT 0.2 12/02/2023 0818     Impression and Plan: Ms. Skow is a very nice 48 year old premenopausal Afro-American female with TRIPLE NEGATIVE breast cancer.  She has aggressive disease in my opinion.  I am just surprised that there is recurrence.  Given the fact that she had negative lymph nodes at the time of surgery is quite unusual.  No she has lost some weight.  Her weight is stabilized a little bit.  Again we are in a situation where we are not going to be able to cure this.  I really want to make sure that we can still be aggressive.  We will have to see if she needs any surgery for the left hip.  I spoke with Dr. Eloise Hake of Radiation Oncology and he will take a look at the x-rays.  We will see what the scans look like.  I will have to see if we might get some of molecular studies to see if there is anything targeted that we might be able to use.  Again this is incredibly difficult.  She is so nice.  She is everything that we have asked her to do.  She  just has disease that is certainly somewhat resilient.  I will plan to get her back to see us  probably in about 2 or 3 weeks.   Ivor Mars, MD 4/21/20252:33 PM

## 2023-12-21 NOTE — Patient Instructions (Signed)
 [facility] - A DEPT OF [facility]  Discharge Instructions: Thank you for choosing [facility] to provide your oncology and hematology care.   If you have a lab appointment with the Cancer Center, please go directly to the Cancer Center and check in at the registration area.  Wear comfortable clothing and clothing appropriate for easy access to any Portacath or PICC line.   We strive to give you quality time with your provider. You may need to reschedule your appointment if you arrive late (15 or more minutes).  Arriving late affects you and other patients whose appointments are after yours.  Also, if you miss three or more appointments without notifying the office, you may be dismissed from the clinic at the provider's discretion.      For prescription refill requests, have your pharmacy contact our office and allow 72 hours for refills to be completed.    Today you received the following chemotherapy and/or immunotherapy agents Durvalumab/Etoposdie/Carboplatin        To help prevent nausea and vomiting after your treatment, we encourage you to take your nausea medication as directed.  BELOW ARE SYMPTOMS THAT SHOULD BE REPORTED IMMEDIATELY: *FEVER GREATER THAN 100.4 F (38 C) OR HIGHER *CHILLS OR SWEATING *NAUSEA AND VOMITING THAT IS NOT CONTROLLED WITH YOUR NAUSEA MEDICATION *UNUSUAL SHORTNESS OF BREATH *UNUSUAL BRUISING OR BLEEDING *URINARY PROBLEMS (pain or burning when urinating, or frequent urination) *BOWEL PROBLEMS (unusual diarrhea, constipation, pain near the anus) TENDERNESS IN MOUTH AND THROAT WITH OR WITHOUT PRESENCE OF ULCERS (sore throat, sores in mouth, or a toothache) UNUSUAL RASH, SWELLING OR PAIN  UNUSUAL VAGINAL DISCHARGE OR ITCHING   Items with * indicate a potential emergency and should be followed up as soon as possible or go to the Emergency Department if any problems should occur.  Please show the CHEMOTHERAPY ALERT  CARD or IMMUNOTHERAPY ALERT CARD at check-in to the Emergency Department and triage nurse. Should you have questions after your visit or need to cancel or reschedule your appointment, please contact [facility] - A DEPT OF [facility]  [phone] and follow the prompts.  Office hours are 8:00 a.m. to 4:30 p.m. Monday - Friday. Please note that voicemails left after 4:00 p.m. may not be returned until the following business day.  We are closed weekends and major holidays. You have access to a nurse at all times for urgent questions. Please call the main number to the clinic [phone] and follow the prompts.  For any non-urgent questions, you may also contact your provider using MyChart. We now offer e-Visits for anyone 83 and older to request care online for non-urgent symptoms. For details visit [email or web address].   Also download the MyChart app! Go to the app store, search "MyChart", open the app, select [place], and log in with your MyChart username and password.

## 2023-12-21 NOTE — Progress Notes (Signed)
 Per Dr Maria Shiner, request for Foundation One testing sent on specimen [id number] DOS 06/08/2023.  Oncology Nurse Navigator Documentation     12/21/2023    3:00 PM  Oncology Nurse Navigator Flowsheets  Navigator Location [facility]  Navigator Encounter Type Molecular Studies  Patient Visit Type MedOnc  Treatment Phase Active Tx  Barriers/Navigation Needs Coordination of Care  Interventions Coordination of Care  Acuity Level 1-No Barriers  Coordination of Care Pathology  Support Groups/Services Friends and Family  Time Spent with Patient 30

## 2023-12-21 NOTE — Transitions of Care (Post Inpatient/ED Visit) (Signed)
 12/21/2023  Name: Briana [name] MRN: [id number] DOB: [birth year]  Today's TOC FU Call Status: Today's TOC FU Call Status:: Successful TOC FU Call Completed TOC FU Call Complete Date: 12/21/23 Patient's Name and Date of Birth confirmed.  Transition Care Management Follow-up Telephone Call Date of Discharge: 12/20/23 Discharge Facility: [facility]) Type of Discharge: Inpatient Admission Primary Inpatient Discharge Diagnosis:: Lytic bone lesions How have you been since you were released from the hospital?: Better Any questions or concerns?: No  Items Reviewed: Did you receive and understand the discharge instructions provided?: Yes Medications obtained,verified, and reconciled?: Yes (Medications Reviewed) Any new allergies since your discharge?: No Dietary orders reviewed?: NA Do you have support at home?: No  Medications Reviewed Today: Medications Reviewed Today     Reviewed by Cathye Coca, LPN (Licensed Practical Nurse) on 12/21/23 at [phone]  Med List Status: <None>   Medication Order Taking? Sig Documenting Provider Last Dose Status Informant  amLODipine  (NORVASC ) 10 MG tablet [id number] Yes TAKE 1 TABLET(10 MG) BY MOUTH DAILY Everlina Hock, NP Taking Active Self, Pharmacy Records           Med Note (CRUTHIS, CHLOE C   Fri Dec 18, 2023 11:37 AM) Pt is adamant she is still taking this medication. Dispense report does not support this claim.   bictegravir-emtricitabine -tenofovir  AF (BIKTARVY ) 50-200-25 MG TABS tablet [id number] Yes TAKE 1 TABLET DAILY Comer, Judithann Novas, MD Taking Active Self, Pharmacy Records  capecitabine  (XELODA ) 500 MG tablet [id number] No Take 4 tablets (2,000 mg total) by mouth 2 (two) times daily after a meal. Take within 30 minutes after meals. Take for 14 days on, then off for 7 days. Repeat every 21 days.  Patient not taking: Reported on 12/17/2023   Ivor Mars, MD Not Taking Active Self, Pharmacy Records  ferrous sulfate  325 [phone]) MG EC  tablet [id number] Yes Take 325 mg by mouth daily with breakfast. [provider] Taking Active Self, Pharmacy Records  HYDROcodone -acetaminophen  (NORCO) 5-325 MG tablet [id number] Yes Take 1 tablet by mouth every 6 (six) hours as needed for severe pain (pain score 7-10). Audria Leather, MD Taking Active   megestrol  (MEGACE  ES) 625 MG/5ML suspension [id number] Yes Take 5 mLs (625 mg total) by mouth daily. Ivor Mars, MD Taking Active Self, Pharmacy Records  methocarbamol  (ROBAXIN ) 500 MG tablet [id number] Yes Take 1 tablet (500 mg total) by mouth every 8 (eight) hours as needed for muscle spasms.  Patient taking differently: Take 500 mg by mouth daily as needed for muscle spasms.   Alger Infield, MD Taking Active Self, Pharmacy Records  polyethylene glycol (MIRALAX  / GLYCOLAX ) 17 g packet [id number] Yes Take 17 g by mouth daily as needed for moderate constipation. Audria Leather, MD Taking Active   potassium chloride  SA (KLOR-CON  M) 20 MEQ tablet [id number] Yes Take 1 tablet (20 mEq total) by mouth 2 (two) times daily. Audria Leather, MD Taking Active   senna-docusate (SENOKOT-S) 8.6-50 MG tablet [id number] Yes Take 1 tablet by mouth 2 (two) times daily. Audria Leather, MD Taking Active   traMADol  (ULTRAM ) 50 MG tablet [id number] Yes Take 1 tablet (50 mg total) by mouth every 6 (six) hours as needed for moderate pain (pain score 4-6). Audria Leather, MD Taking Active   triamcinolone  (KENALOG ) 0.025 % ointment [id number] Yes Apply topically 2 (two) times daily as needed.  Patient taking differently: Apply 1 Application topically as needed (eczema).   Comer, Judithann Novas, MD Taking Active Self, Pharmacy  Records  Med List Note Genita Keys, Pinkie Brigham, CPhT 09/23/23 1051): Capecitabine  filled through [facility] and Equipment/Supplies: Were Home Health Services Ordered?: NA Any new equipment or medical supplies ordered?: NA  Functional Questionnaire: Do you need  assistance with bathing/showering or dressing?: No Do you need assistance with meal preparation?: No Do you need assistance with eating?: No Do you have difficulty maintaining continence: No Do you need assistance with getting out of bed/getting out of a chair/moving?: No Do you have difficulty managing or taking your medications?: No  Follow up appointments reviewed: PCP Follow-up appointment confirmed?: Yes Date of PCP follow-up appointment?: 12/24/23 Follow-up Provider: Minna Amass NP Specialist Hospital Follow-up appointment confirmed?: Yes Follow-Up Specialty Provider:: Oncology Do you need transportation to your follow-up appointment?: No Do you understand care options if your condition(s) worsen?: Yes-patient verbalized understanding    SIGNATURE [place], LPN [facility] Health Advisor [facility] You Are. We Are. One [facility] Direct Dial [phone]

## 2023-12-22 ENCOUNTER — Other Ambulatory Visit: Payer: No Typology Code available for payment source

## 2023-12-22 ENCOUNTER — Telehealth: Payer: Self-pay

## 2023-12-22 ENCOUNTER — Other Ambulatory Visit: Payer: Self-pay

## 2023-12-22 ENCOUNTER — Encounter: Payer: Self-pay | Admitting: *Deleted

## 2023-12-22 ENCOUNTER — Encounter

## 2023-12-22 LAB — IRON AND IRON BINDING CAPACITY (CC-WL,HP ONLY)
Iron: 18 ug/dL — ABNORMAL LOW (ref 28–170)
Saturation Ratios: 7 % — ABNORMAL LOW (ref 10.4–31.8)
TIBC: 260 ug/dL (ref 250–450)
UIBC: 242 ug/dL (ref 148–442)

## 2023-12-22 MED ORDER — TRAMADOL HCL 50 MG PO TABS: 50.0000 mg | ORAL_TABLET | Freq: Four times a day (QID) | ORAL | 0 refills | Status: AC | PRN

## 2023-12-22 MED ORDER — OXYCODONE-ACETAMINOPHEN 5-325 MG PO TABS: 1.0000 | ORAL_TABLET | Freq: Three times a day (TID) | ORAL | 0 refills | Status: DC | PRN

## 2023-12-22 NOTE — Addendum Note (Signed)
 Addended by: Arlon Bergamo D on: 12/22/2023 08:33 AM   Modules accepted: Orders

## 2023-12-22 NOTE — Telephone Encounter (Signed)
 Patient called asking for work excuse letter for hospital stay and appt yesterday, with return to work date tomorrow 4/23. Letter sent via mychart and copy left at front desk for pt to pick up. Pt aware.

## 2023-12-23 ENCOUNTER — Other Ambulatory Visit

## 2023-12-23 ENCOUNTER — Other Ambulatory Visit: Payer: Self-pay

## 2023-12-23 DIAGNOSIS — Z79899 Other long term (current) drug therapy: Secondary | ICD-10-CM

## 2023-12-23 DIAGNOSIS — B20 Human immunodeficiency virus [HIV] disease: Secondary | ICD-10-CM

## 2023-12-23 LAB — CULTURE, BLOOD (ROUTINE X 2)
Culture: NO GROWTH
Culture: NO GROWTH

## 2023-12-24 ENCOUNTER — Inpatient Hospital Stay: Admitting: Family Medicine

## 2023-12-24 ENCOUNTER — Encounter

## 2023-12-25 ENCOUNTER — Other Ambulatory Visit

## 2023-12-25 ENCOUNTER — Other Ambulatory Visit: Payer: Self-pay

## 2023-12-25 DIAGNOSIS — B20 Human immunodeficiency virus [HIV] disease: Secondary | ICD-10-CM

## 2023-12-25 NOTE — Addendum Note (Signed)
 Addended by: Arlon Bergamo D on: 12/25/2023 08:57 AM   Modules accepted: Orders

## 2023-12-27 ENCOUNTER — Other Ambulatory Visit (HOSPITAL_BASED_OUTPATIENT_CLINIC_OR_DEPARTMENT_OTHER)

## 2023-12-28 ENCOUNTER — Other Ambulatory Visit: Payer: Self-pay | Admitting: Internal Medicine

## 2023-12-28 ENCOUNTER — Ambulatory Visit: Admitting: Family Medicine

## 2023-12-28 ENCOUNTER — Encounter: Payer: Self-pay | Admitting: Family Medicine

## 2023-12-28 ENCOUNTER — Other Ambulatory Visit: Payer: Self-pay

## 2023-12-28 ENCOUNTER — Other Ambulatory Visit

## 2023-12-28 VITALS — BP 122/87 | HR 108 | Temp 98.2°F | Resp 20 | Ht 64.0 in | Wt 136.0 lb

## 2023-12-28 DIAGNOSIS — E871 Hypo-osmolality and hyponatremia: Secondary | ICD-10-CM | POA: Diagnosis not present

## 2023-12-28 DIAGNOSIS — Z09 Encounter for follow-up examination after completed treatment for conditions other than malignant neoplasm: Secondary | ICD-10-CM

## 2023-12-28 DIAGNOSIS — M898X9 Other specified disorders of bone, unspecified site: Secondary | ICD-10-CM

## 2023-12-28 DIAGNOSIS — C50919 Malignant neoplasm of unspecified site of unspecified female breast: Secondary | ICD-10-CM | POA: Diagnosis not present

## 2023-12-28 DIAGNOSIS — B20 Human immunodeficiency virus [HIV] disease: Secondary | ICD-10-CM

## 2023-12-28 DIAGNOSIS — Z79899 Other long term (current) drug therapy: Secondary | ICD-10-CM

## 2023-12-28 LAB — BASIC METABOLIC PANEL WITH GFR
BUN: 7 mg/dL (ref 6–23)
CO2: 18 meq/L — ABNORMAL LOW (ref 19–32)
Calcium: 7.9 mg/dL — ABNORMAL LOW (ref 8.4–10.5)
Chloride: 104 meq/L (ref 96–112)
Creatinine, Ser: 0.65 mg/dL (ref 0.40–1.20)
GFR: 106.2 mL/min (ref >60.00)
Glucose, Bld: 72 mg/dL (ref 70–99)
Potassium: 4.1 meq/L (ref 3.5–5.1)
Sodium: 133 meq/L — ABNORMAL LOW (ref 135–145)

## 2023-12-28 LAB — CBC WITH DIFFERENTIAL/PLATELET
Basophils Absolute: 0 10*3/uL (ref 0.0–0.1)
Basophils Relative: 0.5 % (ref 0.0–3.0)
Eosinophils Absolute: 0.9 10*3/uL — ABNORMAL HIGH (ref 0.0–0.7)
Eosinophils Relative: 11 % — ABNORMAL HIGH (ref 0.0–5.0)
HCT: 35 % — ABNORMAL LOW (ref 36.0–46.0)
Hemoglobin: 11 g/dL — ABNORMAL LOW (ref 12.0–15.0)
Lymphocytes Relative: 10.7 % — ABNORMAL LOW (ref 12.0–46.0)
Lymphs Abs: 0.9 10*3/uL (ref 0.7–4.0)
MCHC: 31.4 g/dL (ref 30.0–36.0)
MCV: 85.6 fl (ref 78.0–100.0)
Monocytes Absolute: 0.4 10*3/uL (ref 0.1–1.0)
Monocytes Relative: 5.6 % (ref 3.0–12.0)
Neutro Abs: 5.7 10*3/uL (ref 1.4–7.7)
Neutrophils Relative %: 72.2 % (ref 43.0–77.0)
Platelets: 553 10*3/uL — ABNORMAL HIGH (ref 150.0–400.0)
RBC: 4.09 Mil/uL (ref 3.87–5.11)
RDW: 19.9 % — ABNORMAL HIGH (ref 11.5–15.5)
WBC: 8 10*3/uL (ref 4.0–10.5)

## 2023-12-28 NOTE — Progress Notes (Signed)
 Hospital Follow-up  Subjective:     Patient ID: Briana [name], female    DOB: 1976-08-20, 48 y.o.   MRN: [id number]  Chief Complaint  Patient presents with   Follow-up    HPI Patient is in today for hospital follow-up. She is with her mom today.   Melodee Spruce Long Admission 4/17-4/20   Discussed the use of AI scribe software for clinical note transcription with the patient, who gave verbal consent to proceed.  History of Present Illness Briana [name] is a 48 year old female who presents for a hospital follow-up after experiencing severe left leg stiffness and pain and  back pain.  Initially, she experienced stiffness in her left leg, which she attributed to sleeping or sitting in an awkward position. Over time, the stiffness progressed to severe pain, prompting her to visit the emergency room. The pain was primarily in her left leg but also involved her back.  During her hospital stay at [facility], a CT scan revealed lesions on her back and left acetabulum, though no fracture was present. She was treated with hydration and pain management.  Imaging showed multiple lytic bony lesions in her thoracic and lumbar spine, with a possible T10 compression fracture. She experiences shortness of breath when moving in certain ways, though it is not painful, just uncomfortable. She also notes difficulty finding a comfortable sleeping position due to back pain, which has affected her sleep quality.  Her current pain level is around 2 out of 10, managed with medications. She takes Percocet and tramadol  for breakthrough pain.  She is also taking stool softeners and MiraLAX  to manage constipation from the pain medications.  She lives with her 107 year old daughter and works her normal shift with [employer] which does not require driving or extensive physical activity. She denies feeling nauseous but reports feeling tired due to poor sleep.          ROS All review of systems  negative except what is listed in the HPI      Objective:    BP 122/87 (BP Location: Right Arm, Patient Position: Sitting, Cuff Size: Normal)   Pulse (!) 108   Temp 98.2 F (36.8 C) (Oral)   Resp 20   Ht 5\' 4"  (1.626 m)   Wt 136 lb (61.7 kg)   LMP 11/25/2023 Comment: negative UPT  SpO2 100%   BMI 23.34 kg/m     Physical Exam Vitals reviewed.  Constitutional:      Appearance: Normal appearance.  Cardiovascular:     Rate and Rhythm: Normal rate and regular rhythm.     Pulses: Normal pulses.  Pulmonary:     Effort: Pulmonary effort is normal.     Breath sounds: Normal breath sounds.  Skin:    General: Skin is warm and dry.  Neurological:     Mental Status: She is alert and oriented to person, place, and time.  Psychiatric:        Mood and Affect: Mood normal.        Behavior: Behavior normal.        Thought Content: Thought content normal.        Judgment: Judgment normal.       No results found for any visits on 12/28/23.      Assessment & Plan:   Problem List Items Addressed This Visit       Active Problems   Stage IV breast cancer in female [facility])   See below  Relevant Orders   CBC with Differential/Platelet   Basic metabolic panel with GFR   Lytic bone lesions on xray   Multiple lytic lesions in thoracic and lumbar spine, possible T10 compression fracture, and left acetabulum lesions. Oncologist suspects metastatic disease. - Scheduled for PET scan on Tuesday to evaluate extent of metastatic disease. - Scheduled for CT chest and MRI brain on Wednesday for further assessment. - Coordinate with oncology for ongoing management and follow-up after imaging results. - Continue tramadol  and Percocet as needed for pain - Continue stool softeners and MiraLAX . - Encourage hydration.      Relevant Orders   CBC with Differential/Platelet   Basic metabolic panel with GFR   Hyponatremia   Low sodium levels during recent hospitalization - Recheck blood  counts and electrolytes, including sodium levels.      Relevant Orders   CBC with Differential/Platelet   Basic metabolic panel with GFR   Other Visit Diagnoses       Hospital discharge follow-up    -  Primary Discharged 12/20/23. TOC call 12/21/23 Discharge summary and hospital notes. Lab results and imaging reports reviewed. Ensured patient is aware of upcoming appointments, especially imaging appointments this week to further investigate her current status and recent findings. Updating labs based on recent labs in the hospital and patient status. She is in good spirits overall, here with her mom - emotional support provided. Will follow-up pending labs. Keep all upcoming specialist appointments. Education provided regarding pain medications and minimizing side effects. Continue current regimen as this has been adequate per patient.    Relevant Orders   CBC with Differential/Platelet   Basic metabolic panel with GFR        No orders of the defined types were placed in this encounter.   Return if symptoms worsen or fail to improve.  Everlina Hock, NP

## 2023-12-28 NOTE — Assessment & Plan Note (Signed)
 Multiple lytic lesions in thoracic and lumbar spine, possible T10 compression fracture, and left acetabulum lesions. Oncologist suspects metastatic disease. - Scheduled for PET scan on Tuesday to evaluate extent of metastatic disease. - Scheduled for CT chest and MRI brain on Wednesday for further assessment. - Coordinate with oncology for ongoing management and follow-up after imaging results. - Continue tramadol  and Percocet as needed for pain - Continue stool softeners and MiraLAX . - Encourage hydration.

## 2023-12-28 NOTE — Assessment & Plan Note (Signed)
 Low sodium levels during recent hospitalization - Recheck blood counts and electrolytes, including sodium levels.

## 2023-12-28 NOTE — Telephone Encounter (Signed)
Appt 5/5 ?

## 2023-12-28 NOTE — Assessment & Plan Note (Signed)
 See below

## 2023-12-29 ENCOUNTER — Encounter

## 2023-12-29 ENCOUNTER — Encounter (HOSPITAL_COMMUNITY)
Admission: RE | Admit: 2023-12-29 | Discharge: 2023-12-29 | Disposition: A | Discharge status: Home / Self Care | Source: Ambulatory Visit | Attending: Hematology & Oncology | Admitting: Hematology & Oncology

## 2023-12-29 DIAGNOSIS — C50919 Malignant neoplasm of unspecified site of unspecified female breast: Secondary | ICD-10-CM | POA: Diagnosis present

## 2023-12-29 DIAGNOSIS — C50912 Malignant neoplasm of unspecified site of left female breast: Secondary | ICD-10-CM | POA: Insufficient documentation

## 2023-12-29 DIAGNOSIS — C773 Secondary and unspecified malignant neoplasm of axilla and upper limb lymph nodes: Secondary | ICD-10-CM | POA: Diagnosis present

## 2023-12-29 LAB — GLUCOSE, CAPILLARY: Glucose-Capillary: 89 mg/dL (ref 70–99)

## 2023-12-29 MED ORDER — FLUDEOXYGLUCOSE F - 18 (FDG) INJECTION
6.8000 | Freq: Once | INTRAVENOUS | Status: AC
  Administered 2023-12-29: 6.79 via INTRAVENOUS

## 2023-12-30 ENCOUNTER — Ambulatory Visit (HOSPITAL_BASED_OUTPATIENT_CLINIC_OR_DEPARTMENT_OTHER)
Admission: RE | Admit: 2023-12-30 | Discharge: 2023-12-30 | Disposition: A | Discharge status: Home / Self Care | Source: Ambulatory Visit | Attending: Hematology & Oncology | Admitting: Hematology & Oncology

## 2023-12-30 ENCOUNTER — Ambulatory Visit (HOSPITAL_BASED_OUTPATIENT_CLINIC_OR_DEPARTMENT_OTHER)

## 2023-12-30 ENCOUNTER — Encounter (HOSPITAL_BASED_OUTPATIENT_CLINIC_OR_DEPARTMENT_OTHER): Payer: Self-pay

## 2023-12-30 ENCOUNTER — Inpatient Hospital Stay

## 2023-12-30 ENCOUNTER — Ambulatory Visit (HOSPITAL_BASED_OUTPATIENT_CLINIC_OR_DEPARTMENT_OTHER)
Admission: RE | Admit: 2023-12-30 | Discharge: 2023-12-30 | Discharge status: Home / Self Care | Source: Ambulatory Visit | Attending: Hematology & Oncology | Admitting: Hematology & Oncology

## 2023-12-30 VITALS — BP 130/86 | HR 100 | Temp 98.0°F | Resp 18

## 2023-12-30 DIAGNOSIS — C50912 Malignant neoplasm of unspecified site of left female breast: Secondary | ICD-10-CM

## 2023-12-30 DIAGNOSIS — D509 Iron deficiency anemia, unspecified: Secondary | ICD-10-CM | POA: Diagnosis not present

## 2023-12-30 DIAGNOSIS — D508 Other iron deficiency anemias: Secondary | ICD-10-CM

## 2023-12-30 DIAGNOSIS — C50919 Malignant neoplasm of unspecified site of unspecified female breast: Secondary | ICD-10-CM | POA: Insufficient documentation

## 2023-12-30 DIAGNOSIS — C773 Secondary and unspecified malignant neoplasm of axilla and upper limb lymph nodes: Secondary | ICD-10-CM | POA: Insufficient documentation

## 2023-12-30 LAB — LIPID PANEL
Cholesterol: 175 mg/dL (ref <200)
HDL: 40 mg/dL — ABNORMAL LOW (ref >=50)
LDL Cholesterol (Calc): 115 mg/dL — ABNORMAL HIGH
Non-HDL Cholesterol (Calc): 135 mg/dL — ABNORMAL HIGH (ref <130)
Total CHOL/HDL Ratio: 4.4 (calc) (ref <5.0)
Triglycerides: 95 mg/dL (ref <150)

## 2023-12-30 LAB — T-HELPER CELLS (CD4) COUNT (NOT AT ARMC)
Absolute CD4: 227 {cells}/uL — ABNORMAL LOW (ref 490–1740)
CD4 T Helper %: 26 % — ABNORMAL LOW (ref 30–61)
Total lymphocyte count: 870 {cells}/uL (ref 850–3900)

## 2023-12-30 LAB — HIV-1 RNA QUANT-NO REFLEX-BLD
HIV 1 RNA Quant: 27 {copies}/mL — ABNORMAL HIGH
HIV-1 RNA Quant, Log: 1.43 {Log_copies}/mL — ABNORMAL HIGH

## 2023-12-30 MED ORDER — IOHEXOL 300 MG/ML  SOLN
100.0000 mL | Freq: Once | INTRAMUSCULAR | Status: AC | PRN
  Administered 2023-12-30: 75 mL via INTRAVENOUS

## 2023-12-30 MED ORDER — HEPARIN SOD (PORK) LOCK FLUSH 100 UNIT/ML IV SOLN
500.0000 [IU] | Freq: Once | INTRAVENOUS | Status: AC | PRN
  Administered 2023-12-30: 500 [IU]

## 2023-12-30 MED ORDER — SODIUM CHLORIDE 0.9 % IV SOLN: INTRAVENOUS | Status: DC

## 2023-12-30 MED ORDER — SODIUM CHLORIDE 0.9% FLUSH
10.0000 mL | Freq: Once | INTRAVENOUS | Status: AC | PRN
  Administered 2023-12-30: 10 mL

## 2023-12-30 MED ORDER — GADOBUTROL 1 MMOL/ML IV SOLN
7.0000 mL | Freq: Once | INTRAVENOUS | Status: AC | PRN
  Administered 2023-12-30: 6 mL via INTRAVENOUS

## 2023-12-30 MED ORDER — HEPARIN SOD (PORK) LOCK FLUSH 100 UNIT/ML IV SOLN
INTRAVENOUS | Status: AC
  Administered 2023-12-30: 500 [IU] via INTRAVENOUS
  Filled 2023-12-30: qty 5

## 2023-12-30 MED ORDER — HEPARIN SOD (PORK) LOCK FLUSH 100 UNIT/ML IV SOLN
500.0000 [IU] | Freq: Once | INTRAVENOUS | Status: AC
Start: 2023-12-30 — End: 2023-12-30

## 2023-12-30 MED ORDER — IRON SUCROSE 20 MG/ML IV SOLN
300.0000 mg | Freq: Once | INTRAVENOUS | Status: AC
  Administered 2023-12-30: 300 mg via INTRAVENOUS
  Filled 2023-12-30: qty 300

## 2023-12-30 NOTE — Patient Instructions (Signed)

## 2023-12-30 NOTE — Progress Notes (Signed)
 Radiation Oncology         ([phone]) [phone] ________________________________  Reconsultation note  Name: Briana [name] MRN: [id number]  Date: 12/31/2023  DOB: 1975/10/25  BM:WUXL, Sammye Cristal, NP  Ivor Mars, MD   REFERRING PHYSICIAN: Ivor Mars, MD  DIAGNOSIS: The primary encounter diagnosis was Metastasis to bone [facility]). A diagnosis of Stage IV breast cancer in female [facility]) was also pertinent to this visit.  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  -- Invasive ductal carcinoma : ER+ / PR+ / Her2 - / Grade 3.   Bony metastases from stage IV invasive lobular/ductal carcinoma of the left breast  HISTORY OF PRESENT ILLNESS::Phil R Flamand is a 48 y.o. female who is accompanied by her supportive sister. she is seen as a courtesy of Dr. Maria Shiner for an opinion concerning radiation therapy as part of management for her recurrent breast cancer with spine metastasis.  She was last seen in office on 12/17/23 for a routine follow up.   Patient presented to the ED on 12/17/2023 with complaints of severe back pain. She underwent a CT a/p showing new multiple lytic lesions have developed within the axial skeleton suggestive of osseous metastatic disease. In light of results, she was admitted for inpatient care and underwent several imaging including: --MRI of thoracic and lumbar spine showed multiple lytic lesions in the thoracic and lumbar spine, most likely metastatic along with probable T10 compression fracture  -- CT of the left hip showed lytic lesion in the left acetabulum with increased risk of fracture but no acute fracture currently.   Subsequently, she presented for a follow up with Dr. Maria Shiner on 12/21/23 to discuss further treatment plan. Per his  recommendation, she underwent a PET scan to further evaluate her disease progression on 12/29/23 showing new/increased hypermetabolic adenopathy in the lower neck and chest along with considerable scattered lytic osseous metastatic disease compatible with progressive metastatic disease. Scan also noted a 9 mm left upper lobe nodule and suspected uterine fibroids.    Patient also underwent a brain MRI and a CT chest on 12/30/23. Results of these images are pending at this time.   Patient notes a 2-week history of worsening lower back pain and left hip pain. She also notes intermittent right-sided pain that causes her to "catch her breath." She is currently taking Percocet 5-325 q8h and tramadol  50 mg q6h for pain control. She rates her pain 0/10 on medication and 10/10 when she is not taking it. She denies any lower leg numbness, tingling, or weakness, or incontinence.    PREVIOUS RADIATION THERAPY: Yes  First Treatment Date: 2023-07-23 Last Treatment Date: 2023-09-15   Site/Dose/Technique/Mode:   Plan Name: CW_L_BH Site: Chest Wall, Left Technique: 3D Mode: Photon Dose Per Fraction: 1.8 Gy Prescribed Dose (Delivered / Prescribed): 25.2 Gy / 25.2 Gy Prescribed Fxs (Delivered / Prescribed): 14 / 14   Plan Name: CW_L_BH_BO Site: Chest Wall, Left Technique: 3D Mode: Photon Dose Per Fraction: 1.8 Gy Prescribed Dose (Delivered / Prescribed): 25.2 Gy / 25.2 Gy Prescribed Fxs (Delivered / Prescribed): 14 / 14   Plan Name: CW_L_SCV_BH Site: Chest Wall, Left Technique: 3D Mode: Photon Dose Per Fraction: 1.8 Gy Prescribed Dose (Delivered / Prescribed): 50.4 Gy / 50.4 Gy Prescribed Fxs (  Delivered / Prescribed): 28 / 28   Plan Name: CW_L_Bst_BO Site: Chest Wall, Left Technique: Electron Mode: Electron Dose Per Fraction: 2 Gy Prescribed Dose (Delivered / Prescribed): 10 Gy / 10 Gy Prescribed Fxs (Delivered / Prescribed): 5 / 5    PAST MEDICAL HISTORY:  Past Medical History:  Diagnosis  Date   Abnormal Pap smear    s/p colposcopy    Anemia    Breast cancer (HCC) 12/2022   Breast cancer metastasized to axillary lymph node, left (HCC) 02/12/2023   Chronic kidney disease 03/01/2013   kidney infection   History of radiation therapy    left chest wall-07/23/23-09/15/23- Dr. Retta Caster   HIV infection [facility])    Hypertension    Screening for malignant neoplasm of the cervix     PAST SURGICAL HISTORY: Past Surgical History:  Procedure Laterality Date   BREAST BIOPSY Left 01/20/2023   US  LT BREAST BX W LOC DEV 1ST LESION IMG BX SPEC US  GUIDE 01/20/2023 [facility]MOGRAP[facility]   BREAST BIOPSY Left 06/04/2023   US  LT RADIOACTIVE SEED LOC 06/04/2023 [facility] MAMMOGRAPHY   BREAST RECONSTRUCTION WITH PLACEMENT OF TISSUE EXPANDER AND ALLODERM Left 06/08/2023   Procedure: LEFT BREAST RECONSTRUCTION WITH PLACEMENT OF TISSUE EXPANDER AND ALLODERM;  Surgeon: Alger Infield, MD;  Location: [facility];  Service: Plastics;  Laterality: Left;   CAPSULECTOMY Left 12/04/2023   Procedure: COMPLETE BREAST CAPSULECTOMY;  Surgeon: Alger Infield, MD;  Location: [facility];  Service: Plastics;  Laterality: Left;   CESAREAN SECTION     2000/2008   IR IMAGING GUIDED PORT INSERTION  02/06/2023   MASTECTOMY WITH AXILLARY LYMPH NODE DISSECTION Left 06/08/2023   Procedure: LEFT MASTECTOMY WITH TARGETED LYMPH NODE DISSECTION;  Surgeon: Oza Blumenthal, MD;  Location: [facility];  Service: General;  Laterality: Left;  LMA PEC BLOCK   REMOVAL OF TISSUE EXPANDER Left 12/04/2023   Procedure: REMOVAL, TISSUE EXPANDER;  Surgeon: Alger Infield, MD;  Location: [facility];  Service: Plastics;  Laterality: Left;   TUBAL LIGATION      FAMILY HISTORY:  Family History  Problem Relation Age of Onset   Eczema Mother    Diabetes Father    Hypertension Father    Cancer Maternal Aunt    Diabetes Maternal Grandmother    Hypertension Maternal Grandmother     Diabetes Maternal Grandfather    Hypertension Maternal Grandfather    Diabetes Paternal Grandmother    Hypertension Paternal Grandmother    Diabetes Paternal Grandfather    Hypertension Paternal Grandfather     SOCIAL HISTORY:  Social History   Tobacco Use   Smoking status: Never   Smokeless tobacco: Never  Vaping Use   Vaping status: Never Used  Substance Use Topics   Alcohol use: No   Drug use: No    ALLERGIES:  Allergies  Allergen Reactions   Pumpkin Flavoring Agent (Non-Screening) Swelling    ONLY Lip swelling after eating pumpkin pie    MEDICATIONS:  Current Outpatient Medications  Medication Sig Dispense Refill   amLODipine  (NORVASC ) 10 MG tablet TAKE 1 TABLET(10 MG) BY MOUTH DAILY 90 tablet 0   BIKTARVY  50-200-25 MG TABS tablet TAKE 1 TABLET DAILY 30 tablet 0   ferrous sulfate  325 (65 FE) MG EC tablet Take 325 mg by mouth daily with breakfast.     megestrol  (MEGACE  ES) 625 MG/5ML suspension Take 5 mLs (625 mg total) by mouth daily. 150 mL 3   methocarbamol  (ROBAXIN ) 500 MG  tablet Take 1 tablet (500 mg total) by mouth every 8 (eight) hours as needed for muscle spasms. (Patient taking differently: Take 500 mg by mouth daily as needed for muscle spasms.) 20 tablet 0   oxyCODONE -acetaminophen  (PERCOCET/ROXICET) 5-325 MG tablet Take 1 tablet by mouth every 8 (eight) hours as needed for severe pain (pain score 7-10). 90 tablet 0   polyethylene glycol (MIRALAX  / GLYCOLAX ) 17 g packet Take 17 g by mouth daily as needed for moderate constipation. 14 each 0   potassium chloride  SA (KLOR-CON  M) 20 MEQ tablet Take 1 tablet (20 mEq total) by mouth 2 (two) times daily.     senna-docusate (SENOKOT-S) 8.6-50 MG tablet Take 1 tablet by mouth 2 (two) times daily. 30 tablet 0   traMADol  (ULTRAM ) 50 MG tablet Take 1 tablet (50 mg total) by mouth every 6 (six) hours as needed for moderate pain (pain score 4-6). 120 tablet 0   triamcinolone  (KENALOG ) 0.025 % ointment Apply topically 2  (two) times daily as needed. (Patient taking differently: Apply 1 Application topically as needed (eczema).) 454 g 5   capecitabine  (XELODA ) 500 MG tablet Take 4 tablets (2,000 mg total) by mouth 2 (two) times daily after a meal. Take within 30 minutes after meals. Take for 14 days on, then off for 7 days. Repeat every 21 days. (Patient not taking: Reported on 12/31/2023) 112 tablet 4   HYDROcodone -acetaminophen  (NORCO) 5-325 MG tablet Take 1 tablet by mouth every 6 (six) hours as needed for severe pain (pain score 7-10). (Patient not taking: Reported on 12/31/2023) 20 tablet 0   No current facility-administered medications for this encounter.    REVIEW OF SYSTEMS: Notable for that above.   PHYSICAL EXAM:  height is 5\' 5"  (1.651 m) and weight is 134 lb 8 oz (61 kg). Her temporal temperature is 97.3 F (36.3 C) (abnormal). Her blood pressure is 124/81 and her pulse is 96. Her respiration is 18 and oxygen saturation is 100%.   General: Alert and oriented, in no acute distress HEENT: Head is normocephalic. Extraocular movements are intact. Oropharynx is clear. Neck: Neck is supple, palpable right supraclavicular lymphadenopathy. Heart: Regular in rate and rhythm with no murmurs, rubs, or gallops. Chest: Clear to auscultation bilaterally, with no rhonchi, wheezes, or rales. Abdomen: Soft, nontender, nondistended, with no rigidity or guarding. Extremities: No cyanosis or edema. Lymphatics: see Neck Exam Skin: No concerning lesions. Musculoskeletal: symmetric strength and muscle tone throughout.  Exquisite tenderness to light palpation along the right side. No tenderness to palpation along the spine.  Neurologic: Cranial nerves II through XII are grossly intact. No obvious focalities. Speech is fluent. Coordination is intact. Psychiatric: Judgment and insight are intact. Affect is appropriate.   ECOG = 2  0 - Asymptomatic (Fully active, able to carry on all predisease activities without  restriction)  1 - Symptomatic but completely ambulatory (Restricted in physically strenuous activity but ambulatory and able to carry out work of a light or sedentary nature. For example, light housework, office work)  2 - Symptomatic, <50% in bed during the day (Ambulatory and capable of all self care but unable to carry out any work activities. Up and about more than 50% of waking hours)  3 - Symptomatic, >50% in bed, but not bedbound (Capable of only limited self-care, confined to bed or chair 50% or more of waking hours)  4 - Bedbound (Completely disabled. Cannot carry on any self-care. Totally confined to bed or chair)  5 - Death  Oken MM, Creech RH, Tormey DC, et al. [phone]). "Toxicity and response criteria of the [facility]". Am. Hillard Lowes. Oncol. 5 (6): 649-55  LABORATORY DATA:  Lab Results  Component Value Date   WBC 8.0 12/28/2023   HGB 11.0 (L) 12/28/2023   HCT 35.0 (L) 12/28/2023   MCV 85.6 12/28/2023   PLT 553.0 (H) 12/28/2023   NEUTROABS 5.7 12/28/2023   Lab Results  Component Value Date   NA 133 (L) 12/28/2023   K 4.1 12/28/2023   CL 104 12/28/2023   CO2 18 (L) 12/28/2023   GLUCOSE 72 12/28/2023   BUN 7 12/28/2023   CREATININE 0.65 12/28/2023   CALCIUM 7.9 (L) 12/28/2023      RADIOGRAPHY: NM PET Image Restage (PS) Skull Base to Thigh (F-18 FDG) Result Date: 12/29/2023 CLINICAL DATA:  Subsequent treatment strategy for breast cancer. EXAM: NUCLEAR MEDICINE PET SKULL BASE TO THIGH TECHNIQUE: 6.8 mCi F-18 FDG was injected intravenously. Full-ring PET imaging was performed from the skull base to thigh after the radiotracer. CT data was obtained and used for attenuation correction and anatomic localization. Fasting blood glucose: 89 mg/dl COMPARISON:  96/12/5407 FINDINGS: Mediastinal blood pool activity: SUV max 1.8 Liver activity: SUV max NA NECK: Small but hypermetabolic right level IV and lower level V lymph nodes including a 0.9 cm right level  IV lymph node on image 35 series 4 with maximum SUV 4.5. Incidental CT findings: None. CHEST: New/increased hypermetabolic bilateral supraclavicular, prevascular, AP window, right paratracheal, left paratracheal, subcarinal, and bilateral hilar adenopathy. Index subcarinal lymph node 1.1 cm in short axis with maximum SUV 7.3. On the other hand, the notable left axillary and subpectoral adenopathy shown on 02/03/2023 is no longer present. Post therapy related findings in the left breast with some associated scarring low-grade hazy activity in the cutaneous and subcutaneous tissues with representative maximum SUV of 3.1, however the large left lateral breast mass shown previously is no longer readily apparent. Small axillary lymph nodes demonstrate only low-grade activity currently. A 9 mm left upper lobe nodule on image 59 series 4 has maximum SUV of 1.3. Incidental CT findings: Faint reticulonodular opacities in the lungs bilaterally. Right Port-A-Cath tip: Right atrium. Stranding in the mediastinum, left breast, left axilla. ABDOMEN/PELVIS: Low-grade activity in bilateral inguinal and external iliac lymph nodes. 1.0 cm left external iliac node on image 152 series 4 with maximum SUV 3.1 (formerly 1.4 and formerly smaller.). Right inguinal node measuring 0.9 cm in short axis on image 169 series 4 with maximum SUV 2.5. Incidental CT findings: Suspected uterine fibroids. SKELETON: Scattered osseous metastatic disease including the right clavicle, various ribs, left proximal humerus, thoracic spine, lumbar spine, and bony pelvis. Lytic lesion of the left posterior acetabular wall and acetabular roof with maximum SUV 7.1. 3.5 cm lytic lesion of the central upper sacrum with maximum SUV 8.3. Incidental CT findings: None. IMPRESSION: 1. Although the left breast mass and left axillary/subpectoral lymph nodes are markedly improved, there is otherwise substantial new/increased hypermetabolic adenopathy in the lower neck and  chest along with considerable scattered lytic osseous metastatic disease compatible with progressive metastatic disease. 2. Post therapy related findings in the left breast. The large left lateral breast mass shown previously is no longer readily apparent. 3. Faint reticulonodular opacities in the lungs bilaterally, possibly infectious or inflammatory. 4. A 9 mm left upper lobe nodule has maximum SUV of 1.3, below blood pool activity. This is indeterminate but could be inflammatory or metastatic. 5. Suspected uterine fibroids.  Electronically Signed   By: Freida Jes M.D.   On: 12/29/2023 17:03   CT L-SPINE NO CHARGE Addendum Date: 12/22/2023 ADDENDUM REPORT: 12/22/2023 14:53 ADDENDUM: Additional reformats were created and reviewed. No changes are made to this interpretation Electronically Signed   By: Worthy Heads M.D.   On: 12/22/2023 14:53   Result Date: 12/22/2023 CLINICAL DATA:  Progressive bilateral leg pain, low back pain. HIV positive. Breast cancer. EXAM: CT LUMBAR SPINE WITHOUT CONTRAST TECHNIQUE: Multidetector CT imaging of the lumbar spine was performed without intravenous contrast administration. Multiplanar CT image reconstructions were also generated. RADIATION DOSE REDUCTION: This exam was performed according to the departmental dose-optimization program which includes automated exposure control, adjustment of the mA and/or kV according to patient size and/or use of iterative reconstruction technique. COMPARISON:  None Available. FINDINGS: Segmentation: 5 lumbar type vertebrae. Alignment: Normal. Vertebrae: There are numerous lytic lesions seen throughout the lumbar spine in keeping with osseous metastatic disease. Additionally, lytic lesion within the right posterolateral T10 vertebral body extends into the right pedicle and is complicated by mild superior endplate fracture with minimal loss of height. No retropulsion. Lytic lesions are seen within the vertebral bodies of T11-L3 and S1  and S3 vertebral bodies. No associated pathologic fracture noted with these lesions. Paraspinal and other soft tissues: Infiltrative fluid or soft tissue seen within retroperitoneum, better described on accompanying CT examination of the abdomen pelvis and favored to represent inflammatory changes related to acute pancreatitis. No loculated fluid collection identified. No paraspinal fluid collection identified. No canal hematoma. Disc levels: Intervertebral disc heights are preserved. No high-grade canal stenosis. Multifactorial mild central canal stenosis is noted, however, at L3-4 and L4-5 secondary 2 lever hypertrophy and mild broad-based disc bulges. No high-grade neuroforaminal narrowing. IMPRESSION: 1. Numerous lytic lesions throughout the lumbar spine in keeping with osseous metastatic disease. 2. Lytic lesion within the right posterolateral T10 vertebral body extends into the right pedicle and is complicated by mild superior endplate fracture with minimal loss of height. No retropulsion. 3. Infiltrative fluid or soft tissue seen within the retroperitoneum, better described on accompanying CT examination of the abdomen pelvis and favored to represent inflammatory changes related to acute pancreatitis. Electronically Signed: By: Worthy Heads M.D. On: 12/18/2023 03:20   CT HIP LEFT W WO CONTRAST Result Date: 12/19/2023 CLINICAL DATA:  Limping. Acute hip pain. Is there impending pathologic fracture? History of stage III breast cancer. Known thoracic and lumbar spine metastases. T10 pathologic compression fracture. EXAM: CT OF THE LOWER LEFT EXTREMITY WITHOUT CONTRAST TECHNIQUE: Multidetector CT imaging of the lower left extremity was performed following the standard protocol before and during bolus administration of intravenous contrast. RADIATION DOSE REDUCTION: This exam was performed according to the departmental dose-optimization program which includes automated exposure control, adjustment of the mA  and/or kV according to patient size and/or use of iterative reconstruction technique. CONTRAST:  OMNIPAQUE  IOHEXOL  300 MG/ML  SOLN COMPARISON:  CT abdomen and pelvis 12/18/2023 FINDINGS: Bones/Joint/Cartilage There is a lytic lesion seen within the superomedial aspect of the left acetabulum in a region measuring up to approximately 9 x 25 x 38 mm (transverse by AP by craniocaudal). There is also additional permeative lucency suspicious for metastatic disease involving within the far posterior acetabulum that is less well defined (axial series 2, image 91). This again has eroded the medial cortex at the superomedial aspect of the acetabulum in a region measuring up to approximately 2.8 cm in AP dimension (axial series 2, image 90 and  joint 2 cm in craniocaudal dimension (coronal series 3, image 80). There is again a lucent lesion within the anterior right S1 vertebral body measuring up to approximately 1.7 x 2.3 x 2.0 cm (transverse by AP by craniocaudal). Mild bilateral sacroiliac joint space narrowing with vacuum phenomenon and anterior endplate osteophytosis. Ligaments Suboptimally assessed by CT. Muscles and Tendons Normal size and density of the regional musculature. Soft tissues Heterogeneous, multilobular leiomyomatous uterus again noted. Surgical clips within the right hemiabdomen mesentery inferior to the gallbladder and within the anterior mesenteric fat just to the left of midline. IMPRESSION: 1. There is a lytic lesion within the superomedial aspect of the left acetabulum in a region measuring up to approximately 9 x 25 x 38 mm (TR x AP x CC). Erosion of the medial acetabular wall cortex does increase susceptibility for pathological fracture. No acute fracture is seen at this time. 2. There is again a lucent lesion within the anterior right S1 vertebral body measuring up to approximately 1.7 x 2.3 x 2.0 cm (TR x AP x CC). Electronically Signed   By: Bertina Broccoli M.D.   On: 12/19/2023 16:44   MR  Lumbar Spine W Wo Contrast Result Date: 12/18/2023 CLINICAL DATA:  Chronic myelopathy thoracic and lumbar spine. Bilateral lower back pain and stiffness in both legs. History of stage III breast cancer status post lumpectomy radiation and chemotherapy. Completing course of antibiotics for infection in tissue expander pocket. EXAM: MRI THORACIC AND LUMBAR SPINE WITHOUT AND WITH CONTRAST TECHNIQUE: Multiplanar and multiecho pulse sequences of the thoracic and lumbar spine were obtained without and with intravenous contrast. CONTRAST:  6mL GADAVIST  GADOBUTROL  1 MMOL/ML IV SOLN COMPARISON:  CT lumbar spine same day. FINDINGS: MRI THORACIC SPINE FINDINGS Alignment:  Thoracic kyphosis is maintained.  No listhesis. Vertebrae: Diffuse signal abnormality throughout the T10 vertebral body with edema and diffuse enhancement. There is extension of signal abnormality into the right pedicle with possible expansion of the pedicle as well as signal abnormality and enhancement extending into the pars interarticularis and facets on the right. Irregularity of the T10 superior endplate with minimal height loss. Slight bowing of the posterior cortex of the T10 vertebral body without significant retropulsion appreciated. STIR hyperintense lesion in the T2 vertebral body without definite enhancement. Additional enhancing lesion in the left inferior aspect of T4. Enhancing lesion in the left posterosuperior aspect of T8 with involvement of the left pedicle. T1 and T2 hyperintense lesion in the T5 vertebral body suggestive of hemangioma. Irregularity of the T6 inferior endplate with mild edema without evidence of focal lesion, likely degenerative. Focal irregularity of the T11 inferior endplate likely reflecting Schmorl's node. More masslike signal abnormality and enhancement in the left anterior aspect of T11 likely reflecting osseous lesion. Small enhancing lesion in the superior aspect of T12. Cord:  Normal signal and morphology.  Paraspinal and other soft tissues: The visualized paraspinal soft tissues are unremarkable. There is no evidence of abnormal epidural enhancement or epidural fluid collection. Suggestion of heterogeneous opacities in the bilateral lower lobes. Trace bilateral pleural effusions. Disc levels: Disc heights are maintained. Mild disc desiccation at multiple levels in the midthoracic spine. There is no large disc herniation or high-grade spinal canal stenosis. Facet arthrosis at multiple levels. Mild foraminal stenosis on the left at T6-7. Additional mild-to-moderate foraminal stenosis on the right at T10-11. MRI LUMBAR SPINE FINDINGS Segmentation:  Standard. Alignment: Lumbar lordosis is maintained. No significant listhesis. Vertebrae: STIR hyperintense enhancing lesion in the left aspect of the  L1 vertebral body. Similar smaller appearing lesion in the right aspect of the L2 vertebral body and in the left anterior inferior aspect of L4. Prominent focus of signal abnormality and enhancement in the S1 vertebra with a cystic focus anteriorly. Additional partially visualized signal abnormality and enhancement involving the S3 vertebra. Additional signal abnormality and enhancement in the L4 spinous process. Conus medullaris: Extends to the L1-2 level and appears normal. The visualized conus medullaris is normal in caliber and signal intensity. Normal appearance of the cauda equina nerve roots. Linear enhancement along the ventral aspect of the lower thoracic cord and conus medullaris is likely vascular in etiology. No nodular or masslike enhancement noted. Paraspinal and other soft tissues: There is edema in the paraspinal musculature adjacent to the bilateral L4-5 facets likely secondary to facet arthrosis. There is also signal abnormality and likely edema throughout the visualized retroperitoneum without evidence of associated enhancement. Disc levels: Minimal disc bulges at L1-2 and L2-3 without significant spinal canal  stenosis. Disc bulge and facet arthrosis at L3-4. There is mild lateral recess narrowing at L3-4 without significant spinal canal stenosis. Disc desiccation at L4-5 with a diffuse disc bulge and central disc protrusion resulting in lateral recess narrowing. Moderate facet arthrosis at L4-5. Mild spinal canal stenosis at L4-5. IMPRESSION: Multiple enhancing lesions throughout the thoracic and lumbar spine as well as the partially visualized sacrum concerning for osseous metastatic disease. Diffuse signal abnormality throughout the T10 vertebra with bowing of the posterior cortex, subtle irregularity of the superior endplate and minimal height loss concerning for pathologic compression fracture. No significant retropulsion. Degenerative changes of the thoracic and lumbar spine as above. No high-grade spinal canal stenosis. Facet arthrosis at multiple levels most pronounced at L4-5. Diffuse edema throughout the retroperitoneum likely related to pancreatitis better evaluated on same day CT abdomen pelvis. Electronically Signed   By: Denny Flack M.D.   On: 12/18/2023 11:41   MR THORACIC SPINE W WO CONTRAST Result Date: 12/18/2023 CLINICAL DATA:  Chronic myelopathy thoracic and lumbar spine. Bilateral lower back pain and stiffness in both legs. History of stage III breast cancer status post lumpectomy radiation and chemotherapy. Completing course of antibiotics for infection in tissue expander pocket. EXAM: MRI THORACIC AND LUMBAR SPINE WITHOUT AND WITH CONTRAST TECHNIQUE: Multiplanar and multiecho pulse sequences of the thoracic and lumbar spine were obtained without and with intravenous contrast. CONTRAST:  6mL GADAVIST  GADOBUTROL  1 MMOL/ML IV SOLN COMPARISON:  CT lumbar spine same day. FINDINGS: MRI THORACIC SPINE FINDINGS Alignment:  Thoracic kyphosis is maintained.  No listhesis. Vertebrae: Diffuse signal abnormality throughout the T10 vertebral body with edema and diffuse enhancement. There is extension of  signal abnormality into the right pedicle with possible expansion of the pedicle as well as signal abnormality and enhancement extending into the pars interarticularis and facets on the right. Irregularity of the T10 superior endplate with minimal height loss. Slight bowing of the posterior cortex of the T10 vertebral body without significant retropulsion appreciated. STIR hyperintense lesion in the T2 vertebral body without definite enhancement. Additional enhancing lesion in the left inferior aspect of T4. Enhancing lesion in the left posterosuperior aspect of T8 with involvement of the left pedicle. T1 and T2 hyperintense lesion in the T5 vertebral body suggestive of hemangioma. Irregularity of the T6 inferior endplate with mild edema without evidence of focal lesion, likely degenerative. Focal irregularity of the T11 inferior endplate likely reflecting Schmorl's node. More masslike signal abnormality and enhancement in the left anterior aspect of T11  likely reflecting osseous lesion. Small enhancing lesion in the superior aspect of T12. Cord:  Normal signal and morphology. Paraspinal and other soft tissues: The visualized paraspinal soft tissues are unremarkable. There is no evidence of abnormal epidural enhancement or epidural fluid collection. Suggestion of heterogeneous opacities in the bilateral lower lobes. Trace bilateral pleural effusions. Disc levels: Disc heights are maintained. Mild disc desiccation at multiple levels in the midthoracic spine. There is no large disc herniation or high-grade spinal canal stenosis. Facet arthrosis at multiple levels. Mild foraminal stenosis on the left at T6-7. Additional mild-to-moderate foraminal stenosis on the right at T10-11. MRI LUMBAR SPINE FINDINGS Segmentation:  Standard. Alignment: Lumbar lordosis is maintained. No significant listhesis. Vertebrae: STIR hyperintense enhancing lesion in the left aspect of the L1 vertebral body. Similar smaller appearing lesion  in the right aspect of the L2 vertebral body and in the left anterior inferior aspect of L4. Prominent focus of signal abnormality and enhancement in the S1 vertebra with a cystic focus anteriorly. Additional partially visualized signal abnormality and enhancement involving the S3 vertebra. Additional signal abnormality and enhancement in the L4 spinous process. Conus medullaris: Extends to the L1-2 level and appears normal. The visualized conus medullaris is normal in caliber and signal intensity. Normal appearance of the cauda equina nerve roots. Linear enhancement along the ventral aspect of the lower thoracic cord and conus medullaris is likely vascular in etiology. No nodular or masslike enhancement noted. Paraspinal and other soft tissues: There is edema in the paraspinal musculature adjacent to the bilateral L4-5 facets likely secondary to facet arthrosis. There is also signal abnormality and likely edema throughout the visualized retroperitoneum without evidence of associated enhancement. Disc levels: Minimal disc bulges at L1-2 and L2-3 without significant spinal canal stenosis. Disc bulge and facet arthrosis at L3-4. There is mild lateral recess narrowing at L3-4 without significant spinal canal stenosis. Disc desiccation at L4-5 with a diffuse disc bulge and central disc protrusion resulting in lateral recess narrowing. Moderate facet arthrosis at L4-5. Mild spinal canal stenosis at L4-5. IMPRESSION: Multiple enhancing lesions throughout the thoracic and lumbar spine as well as the partially visualized sacrum concerning for osseous metastatic disease. Diffuse signal abnormality throughout the T10 vertebra with bowing of the posterior cortex, subtle irregularity of the superior endplate and minimal height loss concerning for pathologic compression fracture. No significant retropulsion. Degenerative changes of the thoracic and lumbar spine as above. No high-grade spinal canal stenosis. Facet arthrosis at  multiple levels most pronounced at L4-5. Diffuse edema throughout the retroperitoneum likely related to pancreatitis better evaluated on same day CT abdomen pelvis. Electronically Signed   By: Denny Flack M.D.   On: 12/18/2023 11:41   CT ABDOMEN PELVIS W CONTRAST Result Date: 12/18/2023 CLINICAL DATA:  Abdominal pain, flank pain, low back pain. Breast cancer. HIV positive. * Tracking Code: BO * EXAM: CT ABDOMEN AND PELVIS WITH CONTRAST TECHNIQUE: Multidetector CT imaging of the abdomen and pelvis was performed using the standard protocol following bolus administration of intravenous contrast. RADIATION DOSE REDUCTION: This exam was performed according to the departmental dose-optimization program which includes automated exposure control, adjustment of the mA and/or kV according to patient size and/or use of iterative reconstruction technique. CONTRAST:  OMNIPAQUE  IOHEXOL  300 MG/ML  SOLN COMPARISON:  08/23/2021 FINDINGS: Lower chest: No acute abnormality. Small hiatal hernia. Fluid within the visualized distal esophagus may reflect changes of gastroesophageal reflux. Hepatobiliary: Moderate hepatomegaly. No focal intrahepatic mass identified. No intra or extrahepatic biliary ductal dilation. Gallbladder  unremarkable. Pancreas: There is infiltrative fluid within the retroperitoneum surrounding the head and uncinate process of the pancreas, second and third portion the duodenum, and tracking inferiorly along the great vessels and right ureter. This likely reflects changes acute pancreatitis involving the head and uncinate process pancreas though this can be seen, less commonly, with infectious or inflammatory duodenitis, or infiltrative retroperitoneal malignancy such as lymphoma or inflammatory conditions such as IgG4 related retroperitoneal fibrosis. Normal enhancement of the pancreatic parenchyma. The pancreatic duct is not dilated. No loculated peripancreatic fluid collections are identified. Spleen:  Normal in size without focal abnormality. Adrenals/Urinary Tract: Adrenal glands are unremarkable. Kidneys are normal, without renal calculi, focal lesion, or hydronephrosis. Bladder is unremarkable. Stomach/Bowel: Stomach is within normal limits. Appendix appears normal. No evidence of bowel wall thickening, distention, or inflammatory changes. Vascular/Lymphatic: As noted above, the infiltrative disease within retroperitoneum tracks around the abdominal aorta and inferior vena cava the level of the aortic bifurcation without significant mass effect vessels. The abdominal vasculature is otherwise unremarkable. No pathologic adenopathy within the abdomen and pelvis. Reproductive: The uterus is lobulated and enlarged keeping with changes of uterine fibroids. Exophytic fibroids extend into the 8 necks of bilaterally. The pelvic organs are otherwise unremarkable. Other: No abdominal wall hernia or abnormality. No abdominopelvic ascites. Musculoskeletal: Multiple lytic lesions have developed within the axial skeleton, new since prior examination, in keeping with osseous metastatic disease. Lytic lesion within the T10 vertebral body is present with associated superior endplate fracture of T10 and mild loss of height. No retropulsion. Additional lytic lesions are seen within the right pedicle of T11 vertebral bodies T11-L3 and within the S1 vertebral body. Lytic lesion is seen within the left acetabulum with erosion of the medial wall of the acetabulum. No superimposed pathologic fracture or dislocation. IMPRESSION: 1. Infiltrative fluid within the retroperitoneum surrounding the head and uncinate process of the pancreas, second and third portion the duodenum, and tracking inferiorly along the great vessels and right ureter. This likely reflects changes of acute interstitial/edematous pancreatitis, though additional considerations are listed above. Correlation with serum amylase and lipase would be helpful in confirming  this 2. Multiple lytic lesions have developed within the axial skeleton, new since prior examination, in keeping with development of osseous metastatic disease. Associated superior endplate fracture W96 without retropulsion. Erosion of the medial wall of the left acetabulum without evidence pathologic fracture or dislocation 3. Moderate hepatomegaly. 4. Small hiatal hernia. Fluid within the visualized distal esophagus may reflect changes of gastroesophageal reflux. 5. Fibroid uterus. Electronically Signed   By: Worthy Heads M.D.   On: 12/18/2023 03:11      IMPRESSION:  Bony metastases from stage IV invasive lobular/ductal carcinoma of the left breast  We reviewed the patient's current workup. PET unfortunately demonstrates diffuse metastatic disease.  We personally reviewed these images with her today.  Patient is experiencing pain in her lower back, left hip, and right side.  These areas are all consistent with sites of metastatic disease visualized on imaging.  She is a good candidate for palliative radiation to these areas.  Today, I talked to the patient and family about the findings and work-up thus far.  We discussed the natural history of bony metastases and general treatment, highlighting the role of radiotherapy in the management.  We discussed the available radiation techniques, and focused on the details of logistics and delivery.  We reviewed the anticipated acute and late sequelae associated with radiation in this setting.  The patient was encouraged to  ask questions that I answered to the best of my ability. A patient consent form was discussed and signed.  We retained a copy for our records.  The patient would like to proceed with radiation and will be scheduled for CT simulation.  PLAN: Patient is scheduled for CT simulation later today.  Anticipate 30 Gy in 10 fractions delivered to the hypermetabolic T-spine, sacrum, right rib, and left acetabulum.    She will continue with Dr. Maria Shiner  for management of systemic treatment.  She is scheduled for her next chemotherapy infusion on 01/06/2024.  We look forward to again participating in this patient's care.  50 minutes of total time was spent for this patient encounter, including preparation, face-to-face counseling with the patient and coordination of care, physical exam, and documentation of the encounter.   ------------------------------------------------   Julio Ohm, PA-C   Noralee Beam, PhD, MD   [facility]  Radiation Oncology Direct Dial: [phone]  Fax: [phone] [email or web address]   This document serves as a record of services personally performed by Retta Caster, MD and Julio Ohm, PA-C. It was created on his behalf by Lucky Sable, a trained medical scribe. The creation of this record is based on the scribe's personal observations and the provider's statements to them. This document has been checked and approved by the attending provider.

## 2023-12-30 NOTE — Progress Notes (Addendum)
 Histology and Location of Primary Cancer: Spine and left acetabulum  Sites of Visceral and Bony Metastatic Disease:   Location(s) of Symptomatic Metastases:   Past/Anticipated chemotherapy by medical oncology, if any:  Dr. Maria Shiner    Pain on a scale of 0-10 is:  0     If Spine Met(s), symptoms, if any, include: Bowel/Bladder retention or incontinence (please describe): Denies Numbness or weakness in extremities (please describe): Denies Current Decadron  regimen, if applicable: No  Ambulatory status? Walker? Wheelchair?: Ambulatory  SAFETY ISSUES: Prior radiation? Yes Pacemaker/ICD? No Possible current pregnancy? No Is the patient on methotrexate? No  Current Complaints / other details:  None at this time    BP 124/81 (BP Location: Left Arm, Patient Position: Sitting)   Pulse 96 Comment: rechecked manually  Temp (!) 97.3 F (36.3 C) (Temporal)   Resp 18   Ht 5\' 5"  (1.651 m)   Wt 134 lb 8 oz (61 kg)   LMP 11/25/2023 Comment: negative UPT  SpO2 100%   BMI 22.38 kg/m

## 2023-12-31 ENCOUNTER — Ambulatory Visit
Admission: RE | Admit: 2023-12-31 | Discharge: 2023-12-31 | Disposition: A | Discharge status: Home / Self Care | Source: Ambulatory Visit | Attending: Radiation Oncology | Admitting: Radiation Oncology

## 2023-12-31 ENCOUNTER — Encounter

## 2023-12-31 VITALS — BP 124/81 | HR 96 | Temp 97.3°F | Resp 18 | Ht 65.0 in | Wt 134.5 lb

## 2023-12-31 DIAGNOSIS — C50919 Malignant neoplasm of unspecified site of unspecified female breast: Secondary | ICD-10-CM

## 2023-12-31 DIAGNOSIS — M51372 Other intervertebral disc degeneration, lumbosacral region with discogenic back pain and lower extremity pain: Secondary | ICD-10-CM | POA: Insufficient documentation

## 2023-12-31 DIAGNOSIS — R160 Hepatomegaly, not elsewhere classified: Secondary | ICD-10-CM | POA: Insufficient documentation

## 2023-12-31 DIAGNOSIS — R911 Solitary pulmonary nodule: Secondary | ICD-10-CM | POA: Insufficient documentation

## 2023-12-31 DIAGNOSIS — I10 Essential (primary) hypertension: Secondary | ICD-10-CM | POA: Insufficient documentation

## 2023-12-31 DIAGNOSIS — C773 Secondary and unspecified malignant neoplasm of axilla and upper limb lymph nodes: Secondary | ICD-10-CM | POA: Insufficient documentation

## 2023-12-31 DIAGNOSIS — M533 Sacrococcygeal disorders, not elsewhere classified: Secondary | ICD-10-CM | POA: Insufficient documentation

## 2023-12-31 DIAGNOSIS — M47819 Spondylosis without myelopathy or radiculopathy, site unspecified: Secondary | ICD-10-CM | POA: Insufficient documentation

## 2023-12-31 DIAGNOSIS — M25551 Pain in right hip: Secondary | ICD-10-CM | POA: Insufficient documentation

## 2023-12-31 DIAGNOSIS — C7951 Secondary malignant neoplasm of bone: Secondary | ICD-10-CM

## 2023-12-31 DIAGNOSIS — C50412 Malignant neoplasm of upper-outer quadrant of left female breast: Secondary | ICD-10-CM | POA: Insufficient documentation

## 2023-12-31 DIAGNOSIS — J90 Pleural effusion, not elsewhere classified: Secondary | ICD-10-CM | POA: Insufficient documentation

## 2023-12-31 DIAGNOSIS — Z170 Estrogen receptor positive status [ER+]: Secondary | ICD-10-CM | POA: Insufficient documentation

## 2023-12-31 DIAGNOSIS — Z79899 Other long term (current) drug therapy: Secondary | ICD-10-CM | POA: Insufficient documentation

## 2023-12-31 DIAGNOSIS — Z21 Asymptomatic human immunodeficiency virus [HIV] infection status: Secondary | ICD-10-CM | POA: Insufficient documentation

## 2023-12-31 DIAGNOSIS — K449 Diaphragmatic hernia without obstruction or gangrene: Secondary | ICD-10-CM | POA: Insufficient documentation

## 2023-12-31 DIAGNOSIS — Z923 Personal history of irradiation: Secondary | ICD-10-CM | POA: Insufficient documentation

## 2023-12-31 DIAGNOSIS — D259 Leiomyoma of uterus, unspecified: Secondary | ICD-10-CM | POA: Insufficient documentation

## 2023-12-31 DIAGNOSIS — Z510 Encounter for antineoplastic radiation therapy: Secondary | ICD-10-CM | POA: Insufficient documentation

## 2023-12-31 LAB — HIV-1 RNA QUANT-NO REFLEX-BLD

## 2024-01-01 ENCOUNTER — Telehealth: Payer: Self-pay

## 2024-01-01 NOTE — Telephone Encounter (Signed)
Error Dung Prien M Saman Giddens, RMA  

## 2024-01-04 ENCOUNTER — Other Ambulatory Visit: Payer: Self-pay

## 2024-01-04 ENCOUNTER — Ambulatory Visit: Admitting: Infectious Diseases

## 2024-01-04 ENCOUNTER — Encounter: Payer: Self-pay | Admitting: Infectious Diseases

## 2024-01-04 VITALS — BP 128/89 | HR 107 | Temp 98.0°F | Ht 65.0 in | Wt 134.0 lb

## 2024-01-04 DIAGNOSIS — Z23 Encounter for immunization: Secondary | ICD-10-CM

## 2024-01-04 DIAGNOSIS — B20 Human immunodeficiency virus [HIV] disease: Secondary | ICD-10-CM | POA: Diagnosis not present

## 2024-01-04 DIAGNOSIS — Z712 Person consulting for explanation of examination or test findings: Secondary | ICD-10-CM | POA: Diagnosis not present

## 2024-01-04 DIAGNOSIS — Z5181 Encounter for therapeutic drug level monitoring: Secondary | ICD-10-CM | POA: Insufficient documentation

## 2024-01-04 DIAGNOSIS — Z0000 Encounter for general adult medical examination without abnormal findings: Secondary | ICD-10-CM | POA: Insufficient documentation

## 2024-01-04 NOTE — Progress Notes (Unsigned)
 [address], [place], [place], [address]                                                                  Phn. [phone]; Fax: [phone]                                                                             Date: 01/04/24  Reason for Visit: Routine HIV care.  HPI: Briana [name] is a 48 y.o.old female with a history of recurrent metastatic breast ca, follows Dr Maria Shiner, HIV, CKD, HTN who is here for HIV fu.   Accompanied by mother. Reports being compliant to Biktarvy  without missed doses. She lives with her daughter. Denies smoking, alcohol and recreational drugs. Seen in ED in 08/2023 for possible zoster. Recently had removal of left chest tissue expander, capsulectomy and complex repair of left chest on 4/4. Recently admitted 4/17-4/20 for worsening bacl pain and found to have multiple bony metastasis and discharged after pain control. She has been closely following with Oncology and Radiation Oncology for breast ca. She is willing to get menveo #2. Discussed lab results. No other complaints today.   ROS: As stated in above HPI; all other systems were reviewed and are otherwise negative unless noted below  No reported fever / chills, night sweats, acute visual change, odynophagia, chest pain/pressure, new or worsened SOB or WOB, nausea, vomiting, diarrhea, dysuria, GU discharge, syncope, seizures, red/hot swollen joints, hallucinations / delusions, rashes, new allergies, unusual / excessive bleeding, swollen lymph nodes  PMH/ PSH/ FamHx / Social Hx , medications and allergies reviewed and updated as appropriate; please see corresponding tab in EHR / prior notes                                        Current Outpatient Medications on File Prior to Visit  Medication Sig Dispense Refill    amLODipine  (NORVASC ) 10 MG tablet TAKE 1 TABLET(10 MG) BY MOUTH DAILY 90 tablet 0   BIKTARVY  50-200-25 MG TABS tablet TAKE 1 TABLET DAILY 30 tablet 0   ferrous sulfate  325 (65 FE) MG EC tablet Take 325 mg by mouth daily with breakfast.     megestrol  (MEGACE  ES) 625 MG/5ML suspension Take 5 mLs (625 mg total) by mouth daily. 150 mL 3   methocarbamol  (ROBAXIN ) 500 MG tablet Take 1 tablet (500 mg total) by mouth every 8 (eight) hours as needed for muscle spasms. (Patient taking differently: Take 500 mg by mouth daily as needed for muscle spasms.) 20 tablet 0  oxyCODONE -acetaminophen  (PERCOCET/ROXICET) 5-325 MG tablet Take 1 tablet by mouth every 8 (eight) hours as needed for severe pain (pain score 7-10). 90 tablet 0   polyethylene glycol (MIRALAX  / GLYCOLAX ) 17 g packet Take 17 g by mouth daily as needed for moderate constipation. 14 each 0   potassium chloride  SA (KLOR-CON  M) 20 MEQ tablet Take 1 tablet (20 mEq total) by mouth 2 (two) times daily.     senna-docusate (SENOKOT-S) 8.6-50 MG tablet Take 1 tablet by mouth 2 (two) times daily. 30 tablet 0   traMADol  (ULTRAM ) 50 MG tablet Take 1 tablet (50 mg total) by mouth every 6 (six) hours as needed for moderate pain (pain score 4-6). 120 tablet 0   triamcinolone  (KENALOG ) 0.025 % ointment Apply topically 2 (two) times daily as needed. (Patient taking differently: Apply 1 Application topically as needed (eczema).) 454 g 5   No current facility-administered medications on file prior to visit.    Allergies  Allergen Reactions   Pumpkin Flavoring Agent (Non-Screening) Swelling    ONLY Lip swelling after eating pumpkin pie   Past Medical History:  Diagnosis Date   Abnormal Pap smear    s/p colposcopy    Anemia    Breast cancer (HCC) 12/2022   Breast cancer metastasized to axillary lymph node, left (HCC) 02/12/2023   Chronic kidney disease 03/01/2013   kidney infection   History of radiation therapy    left chest wall-07/23/23-09/15/23- Dr.  Retta Caster   HIV infection [facility])    Hypertension    Screening for malignant neoplasm of the cervix     Past Surgical History:  Procedure Laterality Date   BREAST BIOPSY Left 01/20/2023   US  LT BREAST BX W LOC DEV 1ST LESION IMG BX SPEC US  GUIDE 01/20/2023 [facility]   BREAST BIOPSY Left 06/04/2023   US  LT RADIOACTIVE SEED LOC 06/04/2023 [facility]   BREAST RECONSTRUCTION WITH PLACEMENT OF TISSUE EXPANDER AND ALLODERM Left 06/08/2023   Procedure: LEFT BREAST RECONSTRUCTION WITH PLACEMENT OF TISSUE EXPANDER AND ALLODERM;  Surgeon: Alger Infield, MD;  Location: [facility];  Service: Plastics;  Laterality: Left;   CAPSULECTOMY Left 12/04/2023   Procedure: COMPLETE BREAST CAPSULECTOMY;  Surgeon: Alger Infield, MD;  Location: [facility];  Service: Plastics;  Laterality: Left;   CESAREAN SECTION     2000/2008   IR IMAGING GUIDED PORT INSERTION  02/06/2023   MASTECTOMY WITH AXILLARY LYMPH NODE DISSECTION Left 06/08/2023   Procedure: LEFT MASTECTOMY WITH TARGETED LYMPH NODE DISSECTION;  Surgeon: Oza Blumenthal, MD;  Location: [facility];  Service: General;  Laterality: Left;  LMA PEC BLOCK   REMOVAL OF TISSUE EXPANDER Left 12/04/2023   Procedure: REMOVAL, TISSUE EXPANDER;  Surgeon: Alger Infield, MD;  Location: [facility];  Service: Plastics;  Laterality: Left;   TUBAL LIGATION     Social History   Socioeconomic History   Marital status: Single    Spouse name: Not on file   Number of children: Not on file   Years of education: Not on file   Highest education level: Not on file  Occupational History   Not on file  Tobacco Use   Smoking status: Never   Smokeless tobacco: Never  Vaping Use   Vaping status: Never Used  Substance and Sexual Activity   Alcohol use: No   Drug use: No   Sexual activity: Not Currently    Partners: Male  Other Topics Concern   Not on file  Social History Narrative   Not on  file   Social Drivers of Health   Financial Resource Strain: Not on file  Food Insecurity: No Food Insecurity (12/18/2023)   Hunger Vital Sign    Worried About Running Out of Food in the Last Year: Never true    Ran Out of Food in the Last Year: Never true  Transportation Needs: No Transportation Needs (12/18/2023)   PRAPARE - Administrator, Civil Service (Medical): No    Lack of Transportation (Non-Medical): No  Physical Activity: Not on file  Stress: Not on file  Social Connections: Not on file  Intimate Partner Violence: Not At Risk (12/18/2023)   Humiliation, Afraid, Rape, and Kick questionnaire    Fear of Current or Ex-Partner: No    Emotionally Abused: No    Physically Abused: No    Sexually Abused: No   Family History  Problem Relation Age of Onset   Eczema Mother    Diabetes Father    Hypertension Father    Cancer Maternal Aunt    Diabetes Maternal Grandmother    Hypertension Maternal Grandmother    Diabetes Maternal Grandfather    Hypertension Maternal Grandfather    Diabetes Paternal Grandmother    Hypertension Paternal Grandmother    Diabetes Paternal Grandfather    Hypertension Paternal Grandfather     Vitals BP 128/89   Pulse (!) 107   Temp 98 F (36.7 C) (Temporal)   Ht 5\' 5"  (1.651 m)   Wt 134 lb (60.8 kg)   SpO2 99%   BMI 22.30 kg/m    Examination  Gen: no acute distress HEENT: [place]/AT, no scleral icterus, no pale conjunctivae, hearing normal, oral mucosa moist Neck: Supple Cardio: Regular rate and rhythm, S1S2 Resp: Pulmonary effort normal in room air, Normal breath sounds  GI: nondistended, soft and non tender  GU: Musc: Extremities: No pedal edema Skin: No rashes Neuro: grossly non focal , awake, alert and oriented * 3  Psych: Calm, cooperative  Lab Results HIV 1 RNA Quant  Date Value  12/28/2023 CANCELED  12/28/2023 27 copies/mL (H)  06/23/2023 <20 Copies/mL (H)   CD4 T Cell Abs (/uL)  Date Value  06/23/2023 512   04/29/2022 649  04/30/2021 423   Lab Results  Component Value Date   HIV1GENOSEQ REPORT 11/25/2016   Lab Results  Component Value Date   WBC 8.0 12/28/2023   HGB 11.0 (L) 12/28/2023   HCT 35.0 (L) 12/28/2023   MCV 85.6 12/28/2023   PLT 553.0 (H) 12/28/2023    Lab Results  Component Value Date   CREATININE 0.65 12/28/2023   BUN 7 12/28/2023   NA 133 (L) 12/28/2023   K 4.1 12/28/2023   CL 104 12/28/2023   CO2 18 (L) 12/28/2023   Lab Results  Component Value Date   ALT 6 12/21/2023   AST 14 (L) 12/21/2023   ALKPHOS 112 12/21/2023   BILITOT 0.2 12/21/2023    Lab Results  Component Value Date   CHOL 175 12/28/2023   TRIG 95 12/28/2023   HDL 40 (L) 12/28/2023   LDLCALC 115 (H) 12/28/2023   No results found for: "HAV" Lab Results  Component Value Date   HEPBSAG NON REACTIVE 08/24/2021   Lab Results  Component Value Date   HCVAB NON REACTIVE 08/24/2021   Lab Results  Component Value Date   CHLAMYDIAWP Negative 10/17/2022   N Positive (A) 10/17/2022   Lab Results  Component Value Date   GCPROBEAPT  NEGATIVE 03/09/2013   No results found for: "QUANTGOLD"   Health Maintenance: Immunization History  Administered Date(s) Administered   Hepatitis A, Adult 10/08/2021   Hepatitis B 01/23/2011, 03/06/2011, 12/15/2011   Hepb-cpg 10/08/2021   Influenza Whole 06/01/2006, 04/17/2011   Influenza, Seasonal, Injecte, Preservative Fre 07/07/2023   Influenza,inj,Quad PF,6+ Mos 08/18/2013, 05/23/2014, 05/23/2019, 10/03/2020   PNEUMOCOCCAL CONJUGATE-20 10/08/2021   PPD Test 07/18/2014   Pfizer Covid-19 Vaccine Bivalent Booster 57yrs & up 10/08/2021   Pneumococcal Conjugate-13 06/23/2019   Pneumococcal Polysaccharide-23 01/23/2011, 09/13/2019   Tdap 10/12/2017    Assessment/Plan: # HIV - continue Biktarvy , need to space out with Mvs> Meds refilled  - discussed labs  - Fu in 5-6 months    # Left breast ca with mets to the bone - s/p neoadjuvant  chemoimmunotherapy, s/p Left MRM with residual cancer followed by XRT, Xeloda   - On Zometa  4 mg every 3 months, next dose 03/2024 - Follows Radiation Oncology  # Immunization  - Menveo # 1 today  #Health maintenance - dental care discussed - Lipid panel reviewed. ASCVD risk score per 2013 AHA/ACC risk calculator <5% (3.7%). Will discuss with patient for starting statin. - discussed to fu with PCP for cervical ca and colon ca screening. Last pap smear 02/05/23 [facility]. Last seen 12/28/23  Patient's labs were reviewed as well as his previous records. Patients questions were addressed and answered. Safe sex counseling done.   I have personally spent 41 minutes involved in face-to-face and non-face-to-face activities for this patient on the day of the visit. Professional time spent includes the following activities: Preparing to see the patient (review of tests), Obtaining and/or reviewing separately obtained history (admission/discharge record), Performing a medically appropriate examination and/or evaluation , Ordering medications/tests/procedures, referring and communicating with other health care professionals, Documenting clinical information in the EMR, Independently interpreting results (not separately reported), Communicating results to the patient/family/caregiver, Counseling and educating the patient/family/caregiver and Care coordination (not separately reported).   Of note, portions of this note may have been created with voice recognition software. While this note has been edited for accuracy, occasional wrong-word or 'sound-a-like' substitutions may have occurred due to the inherent limitations of voice recognition software.   Electronically signed by:  Terre Ferri, MD Infectious Disease Physician [facility] [address] [place], [place] [address] Phone: [phone]  Fax: [phone]

## 2024-01-05 ENCOUNTER — Encounter

## 2024-01-05 ENCOUNTER — Ambulatory Visit: Payer: No Typology Code available for payment source | Admitting: Internal Medicine

## 2024-01-05 ENCOUNTER — Telehealth: Payer: Self-pay | Admitting: Pharmacist

## 2024-01-05 DIAGNOSIS — Z510 Encounter for antineoplastic radiation therapy: Secondary | ICD-10-CM | POA: Diagnosis not present

## 2024-01-05 DIAGNOSIS — Z23 Encounter for immunization: Secondary | ICD-10-CM | POA: Insufficient documentation

## 2024-01-05 DIAGNOSIS — Z712 Person consulting for explanation of examination or test findings: Secondary | ICD-10-CM | POA: Insufficient documentation

## 2024-01-05 MED ORDER — BIKTARVY 50-200-25 MG PO TABS: 1.0000 | ORAL_TABLET | Freq: Every day | ORAL | 3 refills | Status: DC

## 2024-01-05 NOTE — Telephone Encounter (Signed)
 Dr. Gillian Lacrosse reached out to the pharmacy team to discuss starting atorvastatin for Briana [name] given data from the REPRIEVE Trial to reduce risk of cardiovascular events.   Called Briana [name] today to discuss starting statin, left voicemail requesting a call back.   Valarie Garner, PharmD PGY1 Pharmacy Resident

## 2024-01-06 ENCOUNTER — Inpatient Hospital Stay (HOSPITAL_BASED_OUTPATIENT_CLINIC_OR_DEPARTMENT_OTHER): Admitting: Hematology & Oncology

## 2024-01-06 ENCOUNTER — Inpatient Hospital Stay

## 2024-01-06 ENCOUNTER — Encounter: Payer: Self-pay | Admitting: Hematology & Oncology

## 2024-01-06 ENCOUNTER — Inpatient Hospital Stay: Attending: Hematology & Oncology

## 2024-01-06 ENCOUNTER — Encounter (HOSPITAL_COMMUNITY): Payer: Self-pay

## 2024-01-06 VITALS — BP 124/74 | HR 84 | Resp 17

## 2024-01-06 VITALS — BP 116/90 | HR 111 | Temp 98.3°F | Resp 18 | Ht 65.0 in | Wt 130.1 lb

## 2024-01-06 DIAGNOSIS — C50919 Malignant neoplasm of unspecified site of unspecified female breast: Secondary | ICD-10-CM | POA: Diagnosis not present

## 2024-01-06 DIAGNOSIS — Z79899 Other long term (current) drug therapy: Secondary | ICD-10-CM | POA: Diagnosis not present

## 2024-01-06 DIAGNOSIS — C50912 Malignant neoplasm of unspecified site of left female breast: Secondary | ICD-10-CM

## 2024-01-06 DIAGNOSIS — Z171 Estrogen receptor negative status [ER-]: Secondary | ICD-10-CM | POA: Diagnosis not present

## 2024-01-06 DIAGNOSIS — D508 Other iron deficiency anemias: Secondary | ICD-10-CM

## 2024-01-06 DIAGNOSIS — D509 Iron deficiency anemia, unspecified: Secondary | ICD-10-CM | POA: Diagnosis present

## 2024-01-06 DIAGNOSIS — C7951 Secondary malignant neoplasm of bone: Secondary | ICD-10-CM | POA: Insufficient documentation

## 2024-01-06 DIAGNOSIS — Z21 Asymptomatic human immunodeficiency virus [HIV] infection status: Secondary | ICD-10-CM | POA: Insufficient documentation

## 2024-01-06 LAB — CBC WITH DIFFERENTIAL (CANCER CENTER ONLY)
Abs Immature Granulocytes: 0.13 10*3/uL — ABNORMAL HIGH (ref 0.00–0.07)
Basophils Absolute: 0.1 10*3/uL (ref 0.0–0.1)
Basophils Relative: 1 %
Eosinophils Absolute: 0.9 10*3/uL — ABNORMAL HIGH (ref 0.0–0.5)
Eosinophils Relative: 9 %
HCT: 33.3 % — ABNORMAL LOW (ref 36.0–46.0)
Hemoglobin: 10.2 g/dL — ABNORMAL LOW (ref 12.0–15.0)
Immature Granulocytes: 1 %
Lymphocytes Relative: 9 %
Lymphs Abs: 0.9 10*3/uL (ref 0.7–4.0)
MCH: 26.2 pg (ref 26.0–34.0)
MCHC: 30.6 g/dL (ref 30.0–36.0)
MCV: 85.4 fL (ref 80.0–100.0)
Monocytes Absolute: 0.7 10*3/uL (ref 0.1–1.0)
Monocytes Relative: 7 %
Neutro Abs: 7.6 10*3/uL (ref 1.7–7.7)
Neutrophils Relative %: 73 %
Platelet Count: 422 10*3/uL — ABNORMAL HIGH (ref 150–400)
RBC: 3.9 MIL/uL (ref 3.87–5.11)
RDW: 18.1 % — ABNORMAL HIGH (ref 11.5–15.5)
WBC Count: 10.3 10*3/uL (ref 4.0–10.5)
nRBC: 0 % (ref 0.0–0.2)

## 2024-01-06 LAB — SAVE SMEAR(SSMR), FOR PROVIDER SLIDE REVIEW

## 2024-01-06 LAB — CMP (CANCER CENTER ONLY)
ALT: 6 U/L (ref 0–44)
AST: 14 U/L — ABNORMAL LOW (ref 15–41)
Albumin: 3.7 g/dL (ref 3.5–5.0)
Alkaline Phosphatase: 137 U/L — ABNORMAL HIGH (ref 38–126)
Anion gap: 11 (ref 5–15)
BUN: <5 mg/dL — ABNORMAL LOW (ref 6–20)
CO2: 19 mmol/L — ABNORMAL LOW (ref 22–32)
Calcium: 8.5 mg/dL — ABNORMAL LOW (ref 8.9–10.3)
Chloride: 109 mmol/L (ref 98–111)
Creatinine: 0.71 mg/dL (ref 0.44–1.00)
GFR, Estimated: >60 mL/min (ref >60)
Glucose, Bld: 101 mg/dL — ABNORMAL HIGH (ref 70–99)
Potassium: 3.3 mmol/L — ABNORMAL LOW (ref 3.5–5.1)
Sodium: 139 mmol/L (ref 135–145)
Total Bilirubin: 0.3 mg/dL (ref 0.0–1.2)
Total Protein: 7.7 g/dL (ref 6.5–8.1)

## 2024-01-06 LAB — RETICULOCYTES
Immature Retic Fract: 26.4 % — ABNORMAL HIGH (ref 2.3–15.9)
RBC.: 3.84 MIL/uL — ABNORMAL LOW (ref 3.87–5.11)
Retic Count, Absolute: 108.3 10*3/uL (ref 19.0–186.0)
Retic Ct Pct: 2.8 % (ref 0.4–3.1)

## 2024-01-06 LAB — IRON AND IRON BINDING CAPACITY (CC-WL,HP ONLY)
Iron: 28 ug/dL (ref 28–170)
Saturation Ratios: 13 % (ref 10.4–31.8)
TIBC: 209 ug/dL — ABNORMAL LOW (ref 250–450)
UIBC: 181 ug/dL (ref 148–442)

## 2024-01-06 LAB — LACTATE DEHYDROGENASE: LDH: 408 U/L — ABNORMAL HIGH (ref 98–192)

## 2024-01-06 LAB — FERRITIN: Ferritin: 820 ng/mL — ABNORMAL HIGH (ref 11–307)

## 2024-01-06 MED ORDER — SODIUM CHLORIDE 0.9% FLUSH
10.0000 mL | Freq: Once | INTRAVENOUS | Status: AC | PRN
  Administered 2024-01-06: 10 mL

## 2024-01-06 MED ORDER — HEPARIN SOD (PORK) LOCK FLUSH 100 UNIT/ML IV SOLN
500.0000 [IU] | Freq: Once | INTRAVENOUS | Status: AC | PRN
  Administered 2024-01-06: 500 [IU]

## 2024-01-06 MED ORDER — SODIUM CHLORIDE 0.9 % IV SOLN
300.0000 mg | Freq: Once | INTRAVENOUS | Status: AC
  Administered 2024-01-06: 300 mg via INTRAVENOUS
  Filled 2024-01-06: qty 300

## 2024-01-06 MED ORDER — SODIUM CHLORIDE 0.9 % IV SOLN
INTRAVENOUS | Status: DC
Start: 2024-01-06 — End: 2024-01-06

## 2024-01-06 MED ORDER — DRONABINOL 5 MG PO CAPS: 5.0000 mg | ORAL_CAPSULE | Freq: Two times a day (BID) | ORAL | 0 refills | Status: DC

## 2024-01-06 NOTE — Progress Notes (Signed)
 DISCONTINUE ON PATHWAY REGIMEN - Breast     Cycles 1 through 4: A cycle is every 21 days:     Pembrolizumab       Paclitaxel       Carboplatin       Filgrastim-xxxx    Cycles 5 through 8: A cycle is every 21 days:     Pembrolizumab       Doxorubicin      Cyclophosphamide      Pegfilgrastim-xxxx   **Always confirm dose/schedule in your pharmacy ordering system**  PRIOR TREATMENT: [id number]: Pembrolizumab  200 mg D1 + Paclitaxel  80 mg/m2 D1, 8, 15 + Carboplatin  AUC=5 D1 q21 Days x 12 Weeks, Followed by Pembrolizumab  200 mg + Doxorubicin + Cyclophosphamide q21 Days x 12 Weeks, Followed by Surgery  START ON PATHWAY REGIMEN - Breast     A cycle is every 21 days:     Fam-trastuzumab deruxtecan-nxki   **Always confirm dose/schedule in your pharmacy ordering system**  Patient Characteristics: Distant Metastases or Locoregional Recurrent Disease - Unresected, M0 or Locally Advanced Unresectable Disease Progressing after Neoadjuvant and Local Therapies, M0, HER2 Low/Negative, ER Negative, Chemotherapy, HER2 Low, Second Line, PD-L1 Expression  Negative/Unknown or Not a Candidate for Immunotherapy, and gBRCA Wildtype Therapeutic Status: Distant Metastases HER2 Status: Low ER Status: Negative (-) PR Status: Negative (-) Therapy Approach Indicated: Standard Chemotherapy/Endocrine Therapy Line of Therapy: Second Line Intent of Therapy: Non-Curative / Palliative Intent, Discussed with Patient

## 2024-01-06 NOTE — Patient Instructions (Signed)

## 2024-01-06 NOTE — Patient Instructions (Signed)

## 2024-01-06 NOTE — Progress Notes (Signed)
 Is here Hematology and Oncology Follow Up Visit  Briana [name] [id number] [birth year] 48 y.o. 01/06/2024   Principle Diagnosis:  Stage IIIC (T4N3M0) - TRIPLE NEGATIVE - infiltrating ductal carcinoma of the left breast -metastatic to bone -- HER2 2+ (IHC) HIV (+)  Current Therapy:   Neoadjuvant  chemoimmunotherapy --- carboplatin /Taxol /pembrolizumab  ---s/p cycle #3 - start on 02/19/2023 S/p LEFT MRM - 06/08/2023 --  Residual breast cancer - 3/3 nodes (-) XRT -- Start on 07/22/2023 --completed on 09/15/2023 Xeloda  - s/p cycle #3 on 09/28/2023 Zometa  4 mg IV every 3 months-next dose to be given on 03/2024 Enhertu 5.4 mg/m IV every 3 weeks-start cycle #1 on 01/13/2024     Interim History:  Briana [name] is back for follow-up.  Again, we have metastatic disease..  She had a CT scan of the chest that was done on 12/30/2023.  This did show adenopathy from the lower neck down to the mediastinum and hilar regions.  Also noted was an area on T10.  She had a MRI of the brain.  This did not show any intraparenchymal brain lesions.  She did have some skull lesions.  She also had a PET scan that was done.  The PET scan was done on 12/29/2023.  The PET scan showed increased metabolic activity in the bilateral supraclavicular and mediastinal/hilar nodes.  She had some activity noted in her spine, pelvis and ribs.  Surprisingly, her CA 27.29 just is not that elevated.  We last checked it in April, it was 13.  She has lost weight.  I will go ahead and send in some Marinol for her.  She currently is on Megace .  I think that the best way to try to treat her is to see about Enhertu.  Again her tumor is 2+ by IHC.  Of note, we will try to get another biopsy.  She has adenopathy in the neck that is palpable, mostly in the right neck.  I think we get some biopsies from the lymph nodes in send is soft for molecular studies and also for prognostic markers.  She is getting IV iron .  I really do not not think that she  needs oral iron ..  She is still trying to work.  She works over at [employer].  She works in Engineer, agricultural.  I would like to get an echocardiogram on her.  I think she probably needs to have one done.  She does have some intermittent bony pain.  I think she is on oxycodone .  We did give her Zometa .  I do not think she is due for Zometa  probably for a month or so.  Overall, I would have to say that her performance status is ECOG 1.  Medications:  Current Outpatient Medications:    amLODipine  (NORVASC ) 10 MG tablet, TAKE 1 TABLET(10 MG) BY MOUTH DAILY, Disp: 90 tablet, Rfl: 0   bictegravir-emtricitabine -tenofovir  AF (BIKTARVY ) 50-200-25 MG TABS tablet, Take 1 tablet by mouth daily., Disp: 90 tablet, Rfl: 3   ferrous sulfate  325 (65 FE) MG EC tablet, Take 325 mg by mouth daily with breakfast., Disp: , Rfl:    megestrol  (MEGACE  ES) 625 MG/5ML suspension, Take 5 mLs (625 mg total) by mouth daily., Disp: 150 mL, Rfl: 3   oxyCODONE -acetaminophen  (PERCOCET/ROXICET) 5-325 MG tablet, Take 1 tablet by mouth every 8 (eight) hours as needed for severe pain (pain score 7-10)., Disp: 90 tablet, Rfl: 0   polyethylene glycol (MIRALAX  / GLYCOLAX ) 17 g packet, Take 17  g by mouth daily as needed for moderate constipation., Disp: 14 each, Rfl: 0   potassium chloride  SA (KLOR-CON  M) 20 MEQ tablet, Take 1 tablet (20 mEq total) by mouth 2 (two) times daily., Disp: , Rfl:    senna-docusate (SENOKOT-S) 8.6-50 MG tablet, Take 1 tablet by mouth 2 (two) times daily., Disp: 30 tablet, Rfl: 0   traMADol  (ULTRAM ) 50 MG tablet, Take 1 tablet (50 mg total) by mouth every 6 (six) hours as needed for moderate pain (pain score 4-6)., Disp: 120 tablet, Rfl: 0   triamcinolone  (KENALOG ) 0.025 % ointment, Apply topically 2 (two) times daily as needed. (Patient taking differently: Apply 1 Application topically as needed (eczema).), Disp: 454 g, Rfl: 5   methocarbamol  (ROBAXIN ) 500 MG tablet, Take 1 tablet (500 mg  total) by mouth every 8 (eight) hours as needed for muscle spasms. (Patient not taking: Reported on 01/06/2024), Disp: 20 tablet, Rfl: 0 No current facility-administered medications for this visit.  Facility-Administered Medications Ordered in Other Visits:    0.9 %  sodium chloride  infusion, , Intravenous, Continuous, Tish Forge, NP, Last Rate: 10 mL/hr at 01/06/24 1245, New Bag at 01/06/24 1245   iron  sucrose (VENOFER ) 300 mg in sodium chloride  0.9 % 250 mL IVPB, 300 mg, Intravenous, Once, Tish Forge, NP, Last Rate: 176.7 mL/hr at 01/06/24 1246, 300 mg at 01/06/24 1246  Allergies:  Allergies  Allergen Reactions   Pumpkin Flavoring Agent (Non-Screening) Swelling    ONLY Lip swelling after eating pumpkin pie    Past Medical History, Surgical history, Social history, and Family History were reviewed and updated.  Review of Systems: Review of Systems  Constitutional: Negative.   HENT:  Negative.    Eyes: Negative.   Respiratory: Negative.    Cardiovascular: Negative.   Gastrointestinal: Negative.   Endocrine: Negative.   Genitourinary: Negative.    Musculoskeletal: Negative.   Skin: Negative.   Neurological: Negative.   Hematological: Negative.   Psychiatric/Behavioral: Negative.      Physical Exam: Temperature is 98.3.  Pulse 111.  Blood pressure 116/90.  Weight is 130 pounds.      Wt Readings from Last 3 Encounters:  01/06/24 130 lb 1.9 oz (59 kg)  01/04/24 134 lb (60.8 kg)  12/31/23 134 lb 8 oz (61 kg)    Physical Exam Vitals reviewed.  Constitutional:      Comments: Right breast shows no masses edema or erythema.  There is no right axillary adenopathy.  Left breast really does not show any masses.  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  There is some slight skin breakdown.  There is no exudate.  I cannot palpate any distinct mass in the left breast.   I cannot palpate any left axillary lymph nodes.    HENT:     Head: Normocephalic and  atraumatic.  Eyes:     Pupils: Pupils are equal, round, and reactive to light.  Cardiovascular:     Rate and Rhythm: Normal rate and regular rhythm.     Heart sounds: Normal heart sounds.  Pulmonary:     Effort: Pulmonary effort is normal.     Breath sounds: Normal breath sounds.  Abdominal:     General: Bowel sounds are normal.     Palpations: Abdomen is soft.  Musculoskeletal:        General: No tenderness or deformity. Normal range of motion.     Cervical back: Normal range of motion.  Lymphadenopathy:  Cervical: No cervical adenopathy.  Skin:    General: Skin is warm and dry.     Findings: No erythema or rash.  Neurological:     Mental Status: She is alert and oriented to person, place, and time.  Psychiatric:        Behavior: Behavior normal.        Thought Content: Thought content normal.        Judgment: Judgment normal.     Lab Results  Component Value Date   WBC 10.3 01/06/2024   HGB 10.2 (L) 01/06/2024   HCT 33.3 (L) 01/06/2024   MCV 85.4 01/06/2024   PLT 422 (H) 01/06/2024     Chemistry      Component Value Date/Time   NA 139 01/06/2024 1225   K 3.3 (L) 01/06/2024 1225   CL 109 01/06/2024 1225   CO2 19 (L) 01/06/2024 1225   BUN <5 (L) 01/06/2024 1225   CREATININE 0.71 01/06/2024 1225   CREATININE 0.62 10/17/2022 0947      Component Value Date/Time   CALCIUM 8.5 (L) 01/06/2024 1225   ALKPHOS 137 (H) 01/06/2024 1225   AST 14 (L) 01/06/2024 1225   ALT 6 01/06/2024 1225   BILITOT 0.3 01/06/2024 1225     Impression and Plan: Briana [name] is a very nice 48 year old premenopausal Afro-American female with TRIPLE NEGATIVE breast cancer.  She has aggressive disease in my opinion.  She has already recurred.  She has recurred in her bones in her lymph nodes.  Again we will try to get a biopsy of one of the right supraclavicular lymph nodes and see if we can get some additional molecular information.  I do think that Enhertu would be a good idea for her.   We will try to get the Enhertu started next week.  She will start radiotherapy to some of her bony lesions.  I do not see a problem with her having Enhertu along with radiotherapy.  Again, we are dealing with quality of life at this point.  I really want her to have a decent quality of life.  I think that with the Enhertu, we can hopefully get this and also get a good response.  Will go ahead and get treatment started next week.  I will plan to see her back for her second cycle of treatment.  Unfortunate, we cannot use the CA 27.29 as a marker for response.  Ivor Mars, MD 5/7/20251:33 PM

## 2024-01-06 NOTE — Progress Notes (Signed)
Pt declined to stay for post infusion observation period. Pt stated she has tolerated medication multiple times prior without difficulty. Pt aware to call clinic with any questions or concerns. Pt verbalized understanding and had no further questions.  ? ?

## 2024-01-07 ENCOUNTER — Other Ambulatory Visit: Payer: Self-pay

## 2024-01-07 ENCOUNTER — Encounter

## 2024-01-07 ENCOUNTER — Ambulatory Visit
Admission: RE | Admit: 2024-01-07 | Discharge: 2024-01-07 | Disposition: A | Discharge status: Home / Self Care | Source: Ambulatory Visit | Attending: Radiation Oncology | Admitting: Radiation Oncology

## 2024-01-07 ENCOUNTER — Encounter: Payer: Self-pay | Admitting: Hematology & Oncology

## 2024-01-07 DIAGNOSIS — C50919 Malignant neoplasm of unspecified site of unspecified female breast: Secondary | ICD-10-CM

## 2024-01-07 DIAGNOSIS — Z510 Encounter for antineoplastic radiation therapy: Secondary | ICD-10-CM | POA: Diagnosis not present

## 2024-01-07 LAB — RAD ONC ARIA SESSION SUMMARY
Course Elapsed Days: 0
Plan Fractions Treated to Date: 1
Plan Fractions Treated to Date: 1
Plan Fractions Treated to Date: 1
Plan Fractions Treated to Date: 1
Plan Prescribed Dose Per Fraction: 3 Gy
Plan Prescribed Dose Per Fraction: 3 Gy
Plan Prescribed Dose Per Fraction: 3 Gy
Plan Prescribed Dose Per Fraction: 3 Gy
Plan Total Fractions Prescribed: 10
Plan Total Fractions Prescribed: 10
Plan Total Fractions Prescribed: 10
Plan Total Fractions Prescribed: 10
Plan Total Prescribed Dose: 30 Gy
Plan Total Prescribed Dose: 30 Gy
Plan Total Prescribed Dose: 30 Gy
Plan Total Prescribed Dose: 30 Gy
Reference Point Dosage Given to Date: 3 Gy
Reference Point Dosage Given to Date: 3 Gy
Reference Point Dosage Given to Date: 3 Gy
Reference Point Dosage Given to Date: 3 Gy
Reference Point Session Dosage Given: 3 Gy
Reference Point Session Dosage Given: 3 Gy
Reference Point Session Dosage Given: 3 Gy
Reference Point Session Dosage Given: 3 Gy
Session Number: 1

## 2024-01-07 LAB — ERYTHROPOIETIN: Erythropoietin: 33.1 m[IU]/mL — ABNORMAL HIGH (ref 2.6–18.5)

## 2024-01-07 LAB — CANCER ANTIGEN 27.29: CA 27.29: 17.2 U/mL (ref 0.0–38.6)

## 2024-01-07 NOTE — Progress Notes (Unsigned)
 Myrlene Asper, DO  Derrell Flight; P Ir Procedure Requests OK for US  guided biopsy of right neck mass.  CT 12/30/23  Mabel Savage       Previous Messages    ----- Message ----- From: Derrell Flight Sent: 01/07/2024  10:37 AM EDT To: Derrell Flight; Ir Procedure Requests Subject: US  CORE BIOPSY (SOFT TISSUE)                  Procedure :US  CORE BIOPSY (SOFT TISSUE)  Reason : Recurrent breast cancer.  She has adenopathy in the right neck. Dx: Stage IV breast cancer in female NM PET Image Restage (PS) Skull Base to Thigh History :CT Chest W Contrast ,MR Brain W Wo Contrast,CT HIP LEFT W WO CONTRAST ,MR THORACIC SPINE W WO CONTRAST,MR Lumbar Spine W Wo Contrast,CT L-SPINE NO CHARGE,CT ABDOMEN PELVIS W CONTRAST,US  Venous Img Lower Unilateral Left  Provider:Ennever, Sherryll Donald, MD  Provider contact ; [phone]

## 2024-01-08 ENCOUNTER — Other Ambulatory Visit: Payer: Self-pay

## 2024-01-08 ENCOUNTER — Other Ambulatory Visit: Payer: Self-pay | Admitting: *Deleted

## 2024-01-08 ENCOUNTER — Ambulatory Visit
Admission: RE | Admit: 2024-01-08 | Discharge: 2024-01-08 | Disposition: A | Discharge status: Home / Self Care | Source: Ambulatory Visit | Attending: Radiation Oncology | Admitting: Radiation Oncology

## 2024-01-08 DIAGNOSIS — Z510 Encounter for antineoplastic radiation therapy: Secondary | ICD-10-CM | POA: Diagnosis not present

## 2024-01-08 LAB — RAD ONC ARIA SESSION SUMMARY
Course Elapsed Days: 1
Plan Fractions Treated to Date: 2
Plan Fractions Treated to Date: 2
Plan Fractions Treated to Date: 2
Plan Fractions Treated to Date: 2
Plan Prescribed Dose Per Fraction: 3 Gy
Plan Prescribed Dose Per Fraction: 3 Gy
Plan Prescribed Dose Per Fraction: 3 Gy
Plan Prescribed Dose Per Fraction: 3 Gy
Plan Total Fractions Prescribed: 10
Plan Total Fractions Prescribed: 10
Plan Total Fractions Prescribed: 10
Plan Total Fractions Prescribed: 10
Plan Total Prescribed Dose: 30 Gy
Plan Total Prescribed Dose: 30 Gy
Plan Total Prescribed Dose: 30 Gy
Plan Total Prescribed Dose: 30 Gy
Reference Point Dosage Given to Date: 6 Gy
Reference Point Dosage Given to Date: 6 Gy
Reference Point Dosage Given to Date: 6 Gy
Reference Point Dosage Given to Date: 6 Gy
Reference Point Session Dosage Given: 3 Gy
Reference Point Session Dosage Given: 3 Gy
Reference Point Session Dosage Given: 3 Gy
Reference Point Session Dosage Given: 3 Gy
Session Number: 2

## 2024-01-08 MED ORDER — DRONABINOL 10 MG PO CAPS: ORAL_CAPSULE | ORAL | 0 refills | Status: DC

## 2024-01-11 ENCOUNTER — Other Ambulatory Visit: Payer: Self-pay

## 2024-01-11 ENCOUNTER — Ambulatory Visit
Admission: RE | Admit: 2024-01-11 | Discharge: 2024-01-11 | Disposition: A | Discharge status: Home / Self Care | Source: Ambulatory Visit | Attending: Radiation Oncology | Admitting: Radiation Oncology

## 2024-01-11 ENCOUNTER — Other Ambulatory Visit: Payer: Self-pay | Admitting: *Deleted

## 2024-01-11 ENCOUNTER — Encounter: Payer: Self-pay | Admitting: Hematology & Oncology

## 2024-01-11 DIAGNOSIS — Z510 Encounter for antineoplastic radiation therapy: Secondary | ICD-10-CM | POA: Diagnosis not present

## 2024-01-11 LAB — RAD ONC ARIA SESSION SUMMARY
Course Elapsed Days: 4
Plan Fractions Treated to Date: 3
Plan Fractions Treated to Date: 3
Plan Fractions Treated to Date: 3
Plan Fractions Treated to Date: 3
Plan Prescribed Dose Per Fraction: 3 Gy
Plan Prescribed Dose Per Fraction: 3 Gy
Plan Prescribed Dose Per Fraction: 3 Gy
Plan Prescribed Dose Per Fraction: 3 Gy
Plan Total Fractions Prescribed: 10
Plan Total Fractions Prescribed: 10
Plan Total Fractions Prescribed: 10
Plan Total Fractions Prescribed: 10
Plan Total Prescribed Dose: 30 Gy
Plan Total Prescribed Dose: 30 Gy
Plan Total Prescribed Dose: 30 Gy
Plan Total Prescribed Dose: 30 Gy
Reference Point Dosage Given to Date: 9 Gy
Reference Point Dosage Given to Date: 9 Gy
Reference Point Dosage Given to Date: 9 Gy
Reference Point Dosage Given to Date: 9 Gy
Reference Point Session Dosage Given: 3 Gy
Reference Point Session Dosage Given: 3 Gy
Reference Point Session Dosage Given: 3 Gy
Reference Point Session Dosage Given: 3 Gy
Session Number: 3

## 2024-01-12 ENCOUNTER — Ambulatory Visit
Admission: RE | Admit: 2024-01-12 | Discharge: 2024-01-12 | Disposition: A | Discharge status: Home / Self Care | Source: Ambulatory Visit | Attending: Radiation Oncology | Admitting: Radiation Oncology

## 2024-01-12 ENCOUNTER — Encounter: Payer: Self-pay | Admitting: Family

## 2024-01-12 ENCOUNTER — Other Ambulatory Visit: Payer: Self-pay

## 2024-01-12 ENCOUNTER — Encounter: Payer: Self-pay | Admitting: Hematology & Oncology

## 2024-01-12 DIAGNOSIS — C50912 Malignant neoplasm of unspecified site of left female breast: Secondary | ICD-10-CM

## 2024-01-12 DIAGNOSIS — Z510 Encounter for antineoplastic radiation therapy: Secondary | ICD-10-CM | POA: Diagnosis not present

## 2024-01-12 DIAGNOSIS — C50919 Malignant neoplasm of unspecified site of unspecified female breast: Secondary | ICD-10-CM

## 2024-01-12 LAB — RAD ONC ARIA SESSION SUMMARY
Course Elapsed Days: 5
Plan Fractions Treated to Date: 4
Plan Fractions Treated to Date: 4
Plan Fractions Treated to Date: 4
Plan Fractions Treated to Date: 4
Plan Prescribed Dose Per Fraction: 3 Gy
Plan Prescribed Dose Per Fraction: 3 Gy
Plan Prescribed Dose Per Fraction: 3 Gy
Plan Prescribed Dose Per Fraction: 3 Gy
Plan Total Fractions Prescribed: 10
Plan Total Fractions Prescribed: 10
Plan Total Fractions Prescribed: 10
Plan Total Fractions Prescribed: 10
Plan Total Prescribed Dose: 30 Gy
Plan Total Prescribed Dose: 30 Gy
Plan Total Prescribed Dose: 30 Gy
Plan Total Prescribed Dose: 30 Gy
Reference Point Dosage Given to Date: 12 Gy
Reference Point Dosage Given to Date: 12 Gy
Reference Point Dosage Given to Date: 12 Gy
Reference Point Dosage Given to Date: 12 Gy
Reference Point Session Dosage Given: 3 Gy
Reference Point Session Dosage Given: 3 Gy
Reference Point Session Dosage Given: 3 Gy
Reference Point Session Dosage Given: 3 Gy
Session Number: 4

## 2024-01-13 ENCOUNTER — Inpatient Hospital Stay

## 2024-01-13 ENCOUNTER — Ambulatory Visit: Admitting: Hematology & Oncology

## 2024-01-13 ENCOUNTER — Other Ambulatory Visit: Payer: Self-pay

## 2024-01-13 ENCOUNTER — Ambulatory Visit

## 2024-01-13 ENCOUNTER — Other Ambulatory Visit

## 2024-01-13 ENCOUNTER — Telehealth: Payer: Self-pay | Admitting: Dietician

## 2024-01-13 VITALS — BP 142/92 | HR 100 | Temp 97.7°F | Resp 18

## 2024-01-13 DIAGNOSIS — C50912 Malignant neoplasm of unspecified site of left female breast: Secondary | ICD-10-CM

## 2024-01-13 DIAGNOSIS — C50919 Malignant neoplasm of unspecified site of unspecified female breast: Secondary | ICD-10-CM

## 2024-01-13 DIAGNOSIS — Z21 Asymptomatic human immunodeficiency virus [HIV] infection status: Secondary | ICD-10-CM | POA: Diagnosis not present

## 2024-01-13 DIAGNOSIS — D508 Other iron deficiency anemias: Secondary | ICD-10-CM

## 2024-01-13 LAB — CBC WITH DIFFERENTIAL (CANCER CENTER ONLY)
Abs Immature Granulocytes: 0.13 10*3/uL — ABNORMAL HIGH (ref 0.00–0.07)
Basophils Absolute: 0 10*3/uL (ref 0.0–0.1)
Basophils Relative: 0 %
Eosinophils Absolute: 0 10*3/uL (ref 0.0–0.5)
Eosinophils Relative: 0 %
HCT: 28.6 % — ABNORMAL LOW (ref 36.0–46.0)
Hemoglobin: 8.9 g/dL — ABNORMAL LOW (ref 12.0–15.0)
Immature Granulocytes: 2 %
Lymphocytes Relative: 7 %
Lymphs Abs: 0.6 10*3/uL — ABNORMAL LOW (ref 0.7–4.0)
MCH: 26.6 pg (ref 26.0–34.0)
MCHC: 31.1 g/dL (ref 30.0–36.0)
MCV: 85.6 fL (ref 80.0–100.0)
Monocytes Absolute: 0.5 10*3/uL (ref 0.1–1.0)
Monocytes Relative: 6 %
Neutro Abs: 6.5 10*3/uL (ref 1.7–7.7)
Neutrophils Relative %: 85 %
Platelet Count: 375 10*3/uL (ref 150–400)
RBC: 3.34 MIL/uL — ABNORMAL LOW (ref 3.87–5.11)
RDW: 18.2 % — ABNORMAL HIGH (ref 11.5–15.5)
WBC Count: 7.7 10*3/uL (ref 4.0–10.5)
nRBC: 0.3 % — ABNORMAL HIGH (ref 0.0–0.2)

## 2024-01-13 LAB — CMP (CANCER CENTER ONLY)
ALT: 9 U/L (ref 0–44)
AST: 19 U/L (ref 15–41)
Albumin: 3.5 g/dL (ref 3.5–5.0)
Alkaline Phosphatase: 132 U/L — ABNORMAL HIGH (ref 38–126)
Anion gap: 7 (ref 5–15)
BUN: 12 mg/dL (ref 6–20)
CO2: 22 mmol/L (ref 22–32)
Calcium: 9.2 mg/dL (ref 8.9–10.3)
Chloride: 105 mmol/L (ref 98–111)
Creatinine: 0.52 mg/dL (ref 0.44–1.00)
GFR, Estimated: >60 mL/min (ref >60)
Glucose, Bld: 96 mg/dL (ref 70–99)
Potassium: 3.7 mmol/L (ref 3.5–5.1)
Sodium: 134 mmol/L — ABNORMAL LOW (ref 135–145)
Total Bilirubin: 0.2 mg/dL (ref 0.0–1.2)
Total Protein: 7.4 g/dL (ref 6.5–8.1)

## 2024-01-13 MED ORDER — DEXAMETHASONE SODIUM PHOSPHATE 10 MG/ML IJ SOLN
10.0000 mg | Freq: Once | INTRAMUSCULAR | Status: AC
  Administered 2024-01-13: 10 mg via INTRAVENOUS
  Filled 2024-01-13: qty 1

## 2024-01-13 MED ORDER — DIPHENHYDRAMINE HCL 25 MG PO CAPS
50.0000 mg | ORAL_CAPSULE | Freq: Once | ORAL | Status: AC
  Administered 2024-01-13: 50 mg via ORAL
  Filled 2024-01-13: qty 2

## 2024-01-13 MED ORDER — PALONOSETRON HCL INJECTION 0.25 MG/5ML
0.2500 mg | Freq: Once | INTRAVENOUS | Status: AC
  Administered 2024-01-13: 0.25 mg via INTRAVENOUS
  Filled 2024-01-13: qty 5

## 2024-01-13 MED ORDER — SODIUM CHLORIDE 0.9 % IV SOLN
300.0000 mg | Freq: Once | INTRAVENOUS | Status: AC
  Administered 2024-01-13: 300 mg via INTRAVENOUS
  Filled 2024-01-13: qty 300

## 2024-01-13 MED ORDER — LIDOCAINE-PRILOCAINE 2.5-2.5 % EX CREA: TOPICAL_CREAM | CUTANEOUS | 3 refills | Status: AC

## 2024-01-13 MED ORDER — DEXAMETHASONE 4 MG PO TABS: ORAL_TABLET | ORAL | 1 refills | Status: AC

## 2024-01-13 MED ORDER — SODIUM CHLORIDE 0.9 % IV SOLN
150.0000 mg | Freq: Once | INTRAVENOUS | Status: AC
  Administered 2024-01-13: 150 mg via INTRAVENOUS
  Filled 2024-01-13: qty 150

## 2024-01-13 MED ORDER — PROCHLORPERAZINE MALEATE 10 MG PO TABS: 10.0000 mg | ORAL_TABLET | Freq: Four times a day (QID) | ORAL | 1 refills | Status: DC | PRN

## 2024-01-13 MED ORDER — SODIUM CHLORIDE 0.9% FLUSH
10.0000 mL | INTRAVENOUS | Status: DC | PRN
  Administered 2024-01-13: 10 mL

## 2024-01-13 MED ORDER — FAM-TRASTUZUMAB DERUXTECAN-NXKI CHEMO 100 MG IV SOLR
5.4000 mg/kg | Freq: Once | INTRAVENOUS | Status: AC
  Administered 2024-01-13: 300 mg via INTRAVENOUS
  Filled 2024-01-13: qty 15

## 2024-01-13 MED ORDER — SODIUM CHLORIDE 0.9 % IV SOLN
INTRAVENOUS | Status: AC
Start: 2024-01-13 — End: ?

## 2024-01-13 MED ORDER — HEPARIN SOD (PORK) LOCK FLUSH 100 UNIT/ML IV SOLN
500.0000 [IU] | Freq: Once | INTRAVENOUS | Status: AC | PRN
  Administered 2024-01-13: 500 [IU]

## 2024-01-13 MED ORDER — DEXTROSE 5 % IV SOLN: INTRAVENOUS | Status: DC

## 2024-01-13 MED ORDER — ACETAMINOPHEN 325 MG PO TABS
650.0000 mg | ORAL_TABLET | Freq: Once | ORAL | Status: AC
  Administered 2024-01-13: 650 mg via ORAL
  Filled 2024-01-13: qty 2

## 2024-01-13 MED ORDER — ONDANSETRON HCL 8 MG PO TABS: 8.0000 mg | ORAL_TABLET | Freq: Three times a day (TID) | ORAL | 1 refills | Status: AC | PRN

## 2024-01-13 NOTE — Progress Notes (Unsigned)
 Ok to treat today without ECHO per Dr. Maria Shiner. Patient is scheduled to have ECHO on 01/21/24.

## 2024-01-13 NOTE — Telephone Encounter (Signed)
Patient screened on MST. Second attempt to reach. Provided my cell# on voice mail and in text to return call to set up a nutrition consult.  Cyndi Dinger, RDN, LDN Registered Dietitian, [facility] Part Time Remote (Usual office hours: Tuesday-Thursday) Cell: [phone]   

## 2024-01-13 NOTE — Addendum Note (Signed)
 Addended by: Gray Layman R on: 01/13/2024 11:53 AM   Modules accepted: Orders

## 2024-01-13 NOTE — Patient Instructions (Incomplete)
 [facility] - A DEPT OF [facility]  Discharge Instructions: Thank you for choosing [facility] to provide your oncology and hematology care.   If you have a lab appointment with the Cancer Center, please go directly to the Cancer Center and check in at the registration area.  Wear comfortable clothing and clothing appropriate for easy access to any Portacath or PICC line.   We strive to give you quality time with your provider. You may need to reschedule your appointment if you arrive late (15 or more minutes).  Arriving late affects you and other patients whose appointments are after yours.  Also, if you miss three or more appointments without notifying the office, you may be dismissed from the clinic at the provider's discretion.      For prescription refill requests, have your pharmacy contact our office and allow 72 hours for refills to be completed.    Today you received the following chemotherapy and/or immunotherapy agents ***      To help prevent nausea and vomiting after your treatment, we encourage you to take your nausea medication as directed.  BELOW ARE SYMPTOMS THAT SHOULD BE REPORTED IMMEDIATELY: *FEVER GREATER THAN 100.4 F (38 C) OR HIGHER *CHILLS OR SWEATING *NAUSEA AND VOMITING THAT IS NOT CONTROLLED WITH YOUR NAUSEA MEDICATION *UNUSUAL SHORTNESS OF BREATH *UNUSUAL BRUISING OR BLEEDING *URINARY PROBLEMS (pain or burning when urinating, or frequent urination) *BOWEL PROBLEMS (unusual diarrhea, constipation, pain near the anus) TENDERNESS IN MOUTH AND THROAT WITH OR WITHOUT PRESENCE OF ULCERS (sore throat, sores in mouth, or a toothache) UNUSUAL RASH, SWELLING OR PAIN  UNUSUAL VAGINAL DISCHARGE OR ITCHING   Items with * indicate a potential emergency and should be followed up as soon as possible or go to the Emergency Department if any problems should occur.  Please show the CHEMOTHERAPY ALERT CARD or IMMUNOTHERAPY ALERT  CARD at check-in to the Emergency Department and triage nurse. Should you have questions after your visit or need to cancel or reschedule your appointment, please contact [facility] - A DEPT OF [facility]  [phone] and follow the prompts.  Office hours are 8:00 a.m. to 4:30 p.m. Monday - Friday. Please note that voicemails left after 4:00 p.m. may not be returned until the following business day.  We are closed weekends and major holidays. You have access to a nurse at all times for urgent questions. Please call the main number to the clinic [phone] and follow the prompts.  For any non-urgent questions, you may also contact your provider using MyChart. We now offer e-Visits for anyone 27 and older to request care online for non-urgent symptoms. For details visit [email or web address].   Also download the MyChart app! Go to the app store, search "MyChart", open the app, select [place], and log in with your MyChart username and password.

## 2024-01-13 NOTE — Patient Instructions (Signed)

## 2024-01-14 ENCOUNTER — Other Ambulatory Visit: Payer: Self-pay

## 2024-01-14 ENCOUNTER — Ambulatory Visit
Admission: RE | Admit: 2024-01-14 | Discharge: 2024-01-14 | Disposition: A | Discharge status: Home / Self Care | Source: Ambulatory Visit | Attending: Radiation Oncology | Admitting: Radiation Oncology

## 2024-01-14 DIAGNOSIS — Z510 Encounter for antineoplastic radiation therapy: Secondary | ICD-10-CM | POA: Diagnosis not present

## 2024-01-14 LAB — RAD ONC ARIA SESSION SUMMARY
Course Elapsed Days: 7
Plan Fractions Treated to Date: 5
Plan Fractions Treated to Date: 5
Plan Fractions Treated to Date: 5
Plan Fractions Treated to Date: 5
Plan Prescribed Dose Per Fraction: 3 Gy
Plan Prescribed Dose Per Fraction: 3 Gy
Plan Prescribed Dose Per Fraction: 3 Gy
Plan Prescribed Dose Per Fraction: 3 Gy
Plan Total Fractions Prescribed: 10
Plan Total Fractions Prescribed: 10
Plan Total Fractions Prescribed: 10
Plan Total Fractions Prescribed: 10
Plan Total Prescribed Dose: 30 Gy
Plan Total Prescribed Dose: 30 Gy
Plan Total Prescribed Dose: 30 Gy
Plan Total Prescribed Dose: 30 Gy
Reference Point Dosage Given to Date: 15 Gy
Reference Point Dosage Given to Date: 15 Gy
Reference Point Dosage Given to Date: 15 Gy
Reference Point Dosage Given to Date: 15 Gy
Reference Point Session Dosage Given: 3 Gy
Reference Point Session Dosage Given: 3 Gy
Reference Point Session Dosage Given: 3 Gy
Reference Point Session Dosage Given: 3 Gy
Session Number: 5

## 2024-01-15 ENCOUNTER — Other Ambulatory Visit: Payer: Self-pay

## 2024-01-15 ENCOUNTER — Ambulatory Visit
Admission: RE | Admit: 2024-01-15 | Discharge: 2024-01-15 | Disposition: A | Discharge status: Home / Self Care | Source: Ambulatory Visit | Attending: Radiation Oncology | Admitting: Radiation Oncology

## 2024-01-15 DIAGNOSIS — Z510 Encounter for antineoplastic radiation therapy: Secondary | ICD-10-CM | POA: Diagnosis not present

## 2024-01-15 LAB — RAD ONC ARIA SESSION SUMMARY
Course Elapsed Days: 8
Plan Fractions Treated to Date: 6
Plan Fractions Treated to Date: 6
Plan Fractions Treated to Date: 6
Plan Fractions Treated to Date: 6
Plan Prescribed Dose Per Fraction: 3 Gy
Plan Prescribed Dose Per Fraction: 3 Gy
Plan Prescribed Dose Per Fraction: 3 Gy
Plan Prescribed Dose Per Fraction: 3 Gy
Plan Total Fractions Prescribed: 10
Plan Total Fractions Prescribed: 10
Plan Total Fractions Prescribed: 10
Plan Total Fractions Prescribed: 10
Plan Total Prescribed Dose: 30 Gy
Plan Total Prescribed Dose: 30 Gy
Plan Total Prescribed Dose: 30 Gy
Plan Total Prescribed Dose: 30 Gy
Reference Point Dosage Given to Date: 18 Gy
Reference Point Dosage Given to Date: 18 Gy
Reference Point Dosage Given to Date: 18 Gy
Reference Point Dosage Given to Date: 18 Gy
Reference Point Session Dosage Given: 3 Gy
Reference Point Session Dosage Given: 3 Gy
Reference Point Session Dosage Given: 3 Gy
Reference Point Session Dosage Given: 3 Gy
Session Number: 6

## 2024-01-18 ENCOUNTER — Other Ambulatory Visit: Payer: Self-pay

## 2024-01-18 ENCOUNTER — Ambulatory Visit
Admission: RE | Admit: 2024-01-18 | Discharge: 2024-01-18 | Disposition: A | Discharge status: Home / Self Care | Source: Ambulatory Visit | Attending: Radiation Oncology | Admitting: Radiation Oncology

## 2024-01-18 DIAGNOSIS — Z510 Encounter for antineoplastic radiation therapy: Secondary | ICD-10-CM | POA: Diagnosis not present

## 2024-01-18 LAB — RAD ONC ARIA SESSION SUMMARY
Course Elapsed Days: 11
Plan Fractions Treated to Date: 7
Plan Fractions Treated to Date: 7
Plan Fractions Treated to Date: 7
Plan Fractions Treated to Date: 7
Plan Prescribed Dose Per Fraction: 3 Gy
Plan Prescribed Dose Per Fraction: 3 Gy
Plan Prescribed Dose Per Fraction: 3 Gy
Plan Prescribed Dose Per Fraction: 3 Gy
Plan Total Fractions Prescribed: 10
Plan Total Fractions Prescribed: 10
Plan Total Fractions Prescribed: 10
Plan Total Fractions Prescribed: 10
Plan Total Prescribed Dose: 30 Gy
Plan Total Prescribed Dose: 30 Gy
Plan Total Prescribed Dose: 30 Gy
Plan Total Prescribed Dose: 30 Gy
Reference Point Dosage Given to Date: 21 Gy
Reference Point Dosage Given to Date: 21 Gy
Reference Point Dosage Given to Date: 21 Gy
Reference Point Dosage Given to Date: 21 Gy
Reference Point Session Dosage Given: 3 Gy
Reference Point Session Dosage Given: 3 Gy
Reference Point Session Dosage Given: 3 Gy
Reference Point Session Dosage Given: 3 Gy
Session Number: 7

## 2024-01-19 ENCOUNTER — Ambulatory Visit
Admission: RE | Admit: 2024-01-19 | Discharge: 2024-01-19 | Disposition: A | Discharge status: Home / Self Care | Source: Ambulatory Visit | Attending: Radiation Oncology | Admitting: Radiation Oncology

## 2024-01-19 ENCOUNTER — Other Ambulatory Visit: Payer: Self-pay

## 2024-01-19 DIAGNOSIS — Z510 Encounter for antineoplastic radiation therapy: Secondary | ICD-10-CM | POA: Diagnosis not present

## 2024-01-19 LAB — RAD ONC ARIA SESSION SUMMARY
Course Elapsed Days: 12
Plan Fractions Treated to Date: 8
Plan Fractions Treated to Date: 8
Plan Fractions Treated to Date: 8
Plan Fractions Treated to Date: 8
Plan Prescribed Dose Per Fraction: 3 Gy
Plan Prescribed Dose Per Fraction: 3 Gy
Plan Prescribed Dose Per Fraction: 3 Gy
Plan Prescribed Dose Per Fraction: 3 Gy
Plan Total Fractions Prescribed: 10
Plan Total Fractions Prescribed: 10
Plan Total Fractions Prescribed: 10
Plan Total Fractions Prescribed: 10
Plan Total Prescribed Dose: 30 Gy
Plan Total Prescribed Dose: 30 Gy
Plan Total Prescribed Dose: 30 Gy
Plan Total Prescribed Dose: 30 Gy
Reference Point Dosage Given to Date: 24 Gy
Reference Point Dosage Given to Date: 24 Gy
Reference Point Dosage Given to Date: 24 Gy
Reference Point Dosage Given to Date: 24 Gy
Reference Point Session Dosage Given: 3 Gy
Reference Point Session Dosage Given: 3 Gy
Reference Point Session Dosage Given: 3 Gy
Reference Point Session Dosage Given: 3 Gy
Session Number: 8

## 2024-01-20 ENCOUNTER — Ambulatory Visit
Admission: RE | Admit: 2024-01-20 | Discharge: 2024-01-20 | Disposition: A | Discharge status: Home / Self Care | Source: Ambulatory Visit | Attending: Radiation Oncology | Admitting: Radiation Oncology

## 2024-01-20 ENCOUNTER — Telehealth: Payer: Self-pay | Admitting: Dietician

## 2024-01-20 ENCOUNTER — Other Ambulatory Visit: Payer: Self-pay

## 2024-01-20 ENCOUNTER — Ambulatory Visit

## 2024-01-20 DIAGNOSIS — Z510 Encounter for antineoplastic radiation therapy: Secondary | ICD-10-CM | POA: Diagnosis not present

## 2024-01-20 LAB — RAD ONC ARIA SESSION SUMMARY
Course Elapsed Days: 13
Plan Fractions Treated to Date: 9
Plan Fractions Treated to Date: 9
Plan Fractions Treated to Date: 9
Plan Fractions Treated to Date: 9
Plan Prescribed Dose Per Fraction: 3 Gy
Plan Prescribed Dose Per Fraction: 3 Gy
Plan Prescribed Dose Per Fraction: 3 Gy
Plan Prescribed Dose Per Fraction: 3 Gy
Plan Total Fractions Prescribed: 10
Plan Total Fractions Prescribed: 10
Plan Total Fractions Prescribed: 10
Plan Total Fractions Prescribed: 10
Plan Total Prescribed Dose: 30 Gy
Plan Total Prescribed Dose: 30 Gy
Plan Total Prescribed Dose: 30 Gy
Plan Total Prescribed Dose: 30 Gy
Reference Point Dosage Given to Date: 27 Gy
Reference Point Dosage Given to Date: 27 Gy
Reference Point Dosage Given to Date: 27 Gy
Reference Point Dosage Given to Date: 27 Gy
Reference Point Session Dosage Given: 3 Gy
Reference Point Session Dosage Given: 3 Gy
Reference Point Session Dosage Given: 3 Gy
Reference Point Session Dosage Given: 3 Gy
Session Number: 9

## 2024-01-20 NOTE — Telephone Encounter (Signed)
Third and final attempt to reach patient by telephone. Have left messages with return number. Please consult RD for future needs.    April Manson, RDN, LDN Registered Dietitian, [place] Part Time Remote (Usual office hours: Tuesday-Thursday) Mobile: [phone] Remote Office: [phone]

## 2024-01-21 ENCOUNTER — Ambulatory Visit (HOSPITAL_BASED_OUTPATIENT_CLINIC_OR_DEPARTMENT_OTHER)
Admission: RE | Admit: 2024-01-21 | Discharge: 2024-01-21 | Disposition: A | Discharge status: Home / Self Care | Source: Ambulatory Visit | Attending: Hematology & Oncology | Admitting: Hematology & Oncology

## 2024-01-21 ENCOUNTER — Ambulatory Visit
Admission: RE | Admit: 2024-01-21 | Discharge: 2024-01-21 | Disposition: A | Discharge status: Home / Self Care | Source: Ambulatory Visit | Attending: Radiation Oncology | Admitting: Radiation Oncology

## 2024-01-21 ENCOUNTER — Other Ambulatory Visit: Payer: Self-pay

## 2024-01-21 DIAGNOSIS — C50919 Malignant neoplasm of unspecified site of unspecified female breast: Secondary | ICD-10-CM | POA: Insufficient documentation

## 2024-01-21 DIAGNOSIS — Z0189 Encounter for other specified special examinations: Secondary | ICD-10-CM

## 2024-01-21 DIAGNOSIS — I071 Rheumatic tricuspid insufficiency: Secondary | ICD-10-CM | POA: Insufficient documentation

## 2024-01-21 DIAGNOSIS — Z510 Encounter for antineoplastic radiation therapy: Secondary | ICD-10-CM | POA: Diagnosis not present

## 2024-01-21 LAB — RAD ONC ARIA SESSION SUMMARY
Course Elapsed Days: 14
Plan Fractions Treated to Date: 10
Plan Fractions Treated to Date: 10
Plan Fractions Treated to Date: 10
Plan Fractions Treated to Date: 10
Plan Prescribed Dose Per Fraction: 3 Gy
Plan Prescribed Dose Per Fraction: 3 Gy
Plan Prescribed Dose Per Fraction: 3 Gy
Plan Prescribed Dose Per Fraction: 3 Gy
Plan Total Fractions Prescribed: 10
Plan Total Fractions Prescribed: 10
Plan Total Fractions Prescribed: 10
Plan Total Fractions Prescribed: 10
Plan Total Prescribed Dose: 30 Gy
Plan Total Prescribed Dose: 30 Gy
Plan Total Prescribed Dose: 30 Gy
Plan Total Prescribed Dose: 30 Gy
Reference Point Dosage Given to Date: 30 Gy
Reference Point Dosage Given to Date: 30 Gy
Reference Point Dosage Given to Date: 30 Gy
Reference Point Dosage Given to Date: 30 Gy
Reference Point Session Dosage Given: 3 Gy
Reference Point Session Dosage Given: 3 Gy
Reference Point Session Dosage Given: 3 Gy
Reference Point Session Dosage Given: 3 Gy
Session Number: 10

## 2024-01-21 LAB — ECHOCARDIOGRAM COMPLETE
AR max vel: 1.95 cm2
AV Peak grad: 8.2 mmHg
Ao pk vel: 1.43 m/s
Area-P 1/2: 3.68 cm2
Calc EF: 46.7 %
MV VTI: 2.02 cm2
S' Lateral: 3.3 cm
Single Plane A2C EF: 45.7 %
Single Plane A4C EF: 45.5 %

## 2024-01-22 ENCOUNTER — Ambulatory Visit: Payer: Self-pay | Admitting: Hematology & Oncology

## 2024-01-22 ENCOUNTER — Encounter: Payer: Self-pay | Admitting: *Deleted

## 2024-01-22 NOTE — Radiation Completion Notes (Signed)
  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  Intent: Palliative     ==========DELIVERED PLANS==========  First Treatment Date: 2024-01-07 Last Treatment Date: 2024-01-21   Plan Name: Spine_T10 Site: Thoracic Spine Technique: 3D Mode: Photon Dose Per Fraction: 3 Gy Prescribed Dose (Delivered / Prescribed): 30 Gy / 30 Gy Prescribed Fxs (Delivered / Prescribed): 10 / 10   Plan Name: Chest_R Site: Ribs, Right Technique: 3D Mode: Photon Dose Per Fraction: 3 Gy Prescribed Dose (Delivered / Prescribed): 30 Gy / 30 Gy Prescribed Fxs (Delivered / Prescribed): 10 / 10   Plan Name: Spine_Sacrum Site: Sacrum Technique: Isodose Plan Mode: Photon Dose Per Fraction: 3 Gy Prescribed Dose (Delivered / Prescribed): 30 Gy / 30 Gy Prescribed Fxs (Delivered / Prescribed): 10 / 10   Plan Name: Pelvis_L Site: Hip, Left Technique: Isodose Plan Mode: Photon Dose Per Fraction: 3 Gy Prescribed Dose (Delivered / Prescribed): 30 Gy / 30 Gy Prescribed Fxs (Delivered / Prescribed): 10 / 10     ====================================   The patient tolerated radiation. She developed fatigue throughout her treatment and experienced nausea and vomiting from her chemotherapy. Patient did experience improvement in her pain overall.   The patient will return in one month and will continue follow up with Dr. Maria Shiner as well.      Amiel Kalata, PA-C

## 2024-02-01 ENCOUNTER — Other Ambulatory Visit: Payer: Self-pay | Admitting: Radiology

## 2024-02-01 DIAGNOSIS — C50912 Malignant neoplasm of unspecified site of left female breast: Secondary | ICD-10-CM

## 2024-02-02 ENCOUNTER — Encounter (HOSPITAL_COMMUNITY): Payer: Self-pay

## 2024-02-02 ENCOUNTER — Other Ambulatory Visit: Payer: Self-pay

## 2024-02-02 ENCOUNTER — Ambulatory Visit (HOSPITAL_COMMUNITY)
Admission: RE | Admit: 2024-02-02 | Discharge: 2024-02-02 | Disposition: A | Discharge status: Home / Self Care | Source: Ambulatory Visit | Attending: Hematology & Oncology

## 2024-02-02 ENCOUNTER — Ambulatory Visit (HOSPITAL_COMMUNITY)
Admission: RE | Admit: 2024-02-02 | Discharge: 2024-02-02 | Disposition: A | Discharge status: Home / Self Care | Source: Ambulatory Visit | Attending: Hematology & Oncology | Admitting: Hematology & Oncology

## 2024-02-02 DIAGNOSIS — R590 Localized enlarged lymph nodes: Secondary | ICD-10-CM | POA: Insufficient documentation

## 2024-02-02 DIAGNOSIS — C50919 Malignant neoplasm of unspecified site of unspecified female breast: Secondary | ICD-10-CM | POA: Diagnosis present

## 2024-02-02 DIAGNOSIS — C770 Secondary and unspecified malignant neoplasm of lymph nodes of head, face and neck: Secondary | ICD-10-CM | POA: Diagnosis not present

## 2024-02-02 DIAGNOSIS — C50912 Malignant neoplasm of unspecified site of left female breast: Secondary | ICD-10-CM

## 2024-02-02 MED ORDER — SODIUM CHLORIDE 0.9% FLUSH: 3.0000 mL | INTRAVENOUS | Status: DC | PRN

## 2024-02-02 MED ORDER — SODIUM CHLORIDE 0.9% FLUSH: 3.0000 mL | Freq: Two times a day (BID) | INTRAVENOUS | Status: DC

## 2024-02-02 MED ORDER — LIDOCAINE HCL 1 % IJ SOLN
INTRAMUSCULAR | Status: AC
  Filled 2024-02-02: qty 20

## 2024-02-02 NOTE — Discharge Instructions (Signed)
 For questions /concerns may call Interventional Radiology at [phone] or  Interventional Radiology clinic [phone]   You may remove your dressing and shower tomorrow afternoon                                                                  Needle Biopsy, Care After These instructions tell you how to care for yourself after your procedure. Your doctor may give you more instructions. Call your doctor if you have any problems or questions. What can I expect after the procedure? After a needle biopsy, it is common to have these things at the puncture site: Soreness. Bruising. Mild pain. These things should go away after a few days. Follow these instructions at home: Puncture site care  Wash your hands with soap and water for at least 20 seconds before and after you change your bandage (dressing). If you cannot use soap and water, use hand sanitizer. Follow instructions from your doctor about how to take care of your puncture site. This includes: When and how to change your bandage. When to take off your bandage. Check your puncture site every day for signs of infection. Check for: Redness, swelling, or more pain. Fluid or blood. Warmth. Pus or a bad smell. General instructions Go back to your normal activities when your doctor says that it is safe. Ask if there is anything you cannot do as you heal. Take over-the-counter and prescription medicines only as told by your doctor. Do not take baths, swim, or use a hot tub. Ask your doctor when you can shower. Keep all follow-up visits. Ask if you need an appointment to get your biopsy results. Contact a doctor if: You have a fever. You have redness, swelling, or more pain at the puncture site, and it lasts longer than a few days. You have fluid, blood, or pus coming from the puncture site. Your puncture site feels warm. Get help right away if: You have very bad bleeding from the puncture site. Summary After the procedure, it is  common to have soreness, bruising, or mild pain at the puncture site. Check your puncture site every day for signs of infection, such as redness, swelling, or more pain. Get help right away if you have very bad bleeding from your puncture site. This information is not intended to replace advice given to you by your health care provider. Make sure you discuss any questions you have with your health care provider. Document Revised: 02/06/2021 Document Reviewed: 02/06/2021 Elsevier Patient Education  2024 [employer].

## 2024-02-02 NOTE — Progress Notes (Signed)
 Doing procedure with local.   No IV access or Blood work needed per PA.

## 2024-02-02 NOTE — Progress Notes (Signed)
 Patient had food this morning.   Honore Lux PA aware, taking with Rad IR physician to see if can do procedure or reschedule.

## 2024-02-02 NOTE — Progress Notes (Signed)
 No sedation for biopsy.   Discharged after IR instructions reviewed with patient.

## 2024-02-02 NOTE — Procedures (Signed)
 Interventional Radiology Procedure Note  Procedure: U/E Guided Biopsy of right neck cervical LN  Complications: None  Estimated Blood Loss: < 10 mL  Findings: 18 and 20 G core biopsy of right neck LN performed under U/S guidance.  3 core samples obtained and sent to Pathology.  Rosetta Cons, MD

## 2024-02-02 NOTE — Progress Notes (Signed)
 Patient ID: Briana [name], female   DOB: 11-27-1975, 48 y.o.   MRN: [id number] Pt scheduled for right neck/nodal mass biopsy today. Hx breast cancer. Imaging reviewed by Drs. Mugweru/Babcock. Will plan procedure with local anesthetic only. Risks and benefits of procedure was discussed with the patient  including, but not limited to bleeding, infection, damage to adjacent structures or low yield requiring additional tests.  All of the questions were answered and there is agreement to proceed.  Consent signed and in chart.

## 2024-02-03 ENCOUNTER — Inpatient Hospital Stay

## 2024-02-03 ENCOUNTER — Inpatient Hospital Stay (HOSPITAL_BASED_OUTPATIENT_CLINIC_OR_DEPARTMENT_OTHER): Admitting: Hematology & Oncology

## 2024-02-03 ENCOUNTER — Other Ambulatory Visit: Payer: Self-pay

## 2024-02-03 ENCOUNTER — Inpatient Hospital Stay: Attending: Hematology & Oncology

## 2024-02-03 ENCOUNTER — Encounter: Payer: Self-pay | Admitting: Hematology & Oncology

## 2024-02-03 VITALS — BP 133/96

## 2024-02-03 VITALS — BP 136/98 | HR 100 | Temp 98.3°F | Resp 16 | Ht 65.0 in | Wt 135.0 lb

## 2024-02-03 DIAGNOSIS — C773 Secondary and unspecified malignant neoplasm of axilla and upper limb lymph nodes: Secondary | ICD-10-CM

## 2024-02-03 DIAGNOSIS — C50919 Malignant neoplasm of unspecified site of unspecified female breast: Secondary | ICD-10-CM | POA: Diagnosis not present

## 2024-02-03 DIAGNOSIS — Z79899 Other long term (current) drug therapy: Secondary | ICD-10-CM | POA: Diagnosis not present

## 2024-02-03 DIAGNOSIS — Z21 Asymptomatic human immunodeficiency virus [HIV] infection status: Secondary | ICD-10-CM | POA: Diagnosis present

## 2024-02-03 DIAGNOSIS — C50912 Malignant neoplasm of unspecified site of left female breast: Secondary | ICD-10-CM

## 2024-02-03 DIAGNOSIS — Z171 Estrogen receptor negative status [ER-]: Secondary | ICD-10-CM | POA: Diagnosis not present

## 2024-02-03 DIAGNOSIS — D509 Iron deficiency anemia, unspecified: Secondary | ICD-10-CM | POA: Insufficient documentation

## 2024-02-03 DIAGNOSIS — C7951 Secondary malignant neoplasm of bone: Secondary | ICD-10-CM | POA: Diagnosis not present

## 2024-02-03 LAB — RETICULOCYTES
Immature Retic Fract: 25.2 % — ABNORMAL HIGH (ref 2.3–15.9)
RBC.: 3.52 MIL/uL — ABNORMAL LOW (ref 3.87–5.11)
Retic Count, Absolute: 121.1 10*3/uL (ref 19.0–186.0)
Retic Ct Pct: 3.4 % — ABNORMAL HIGH (ref 0.4–3.1)

## 2024-02-03 LAB — CBC WITH DIFFERENTIAL (CANCER CENTER ONLY)
Abs Immature Granulocytes: 0.13 10*3/uL — ABNORMAL HIGH (ref 0.00–0.07)
Basophils Absolute: 0 10*3/uL (ref 0.0–0.1)
Basophils Relative: 0 %
Eosinophils Absolute: 0.6 10*3/uL — ABNORMAL HIGH (ref 0.0–0.5)
Eosinophils Relative: 12 %
HCT: 30.3 % — ABNORMAL LOW (ref 36.0–46.0)
Hemoglobin: 9.5 g/dL — ABNORMAL LOW (ref 12.0–15.0)
Immature Granulocytes: 3 %
Lymphocytes Relative: 11 %
Lymphs Abs: 0.6 10*3/uL — ABNORMAL LOW (ref 0.7–4.0)
MCH: 27.5 pg (ref 26.0–34.0)
MCHC: 31.4 g/dL (ref 30.0–36.0)
MCV: 87.6 fL (ref 80.0–100.0)
Monocytes Absolute: 0.5 10*3/uL (ref 0.1–1.0)
Monocytes Relative: 10 %
Neutro Abs: 3.3 10*3/uL (ref 1.7–7.7)
Neutrophils Relative %: 64 %
Platelet Count: 172 10*3/uL (ref 150–400)
RBC: 3.46 MIL/uL — ABNORMAL LOW (ref 3.87–5.11)
RDW: 18.7 % — ABNORMAL HIGH (ref 11.5–15.5)
WBC Count: 5.2 10*3/uL (ref 4.0–10.5)
nRBC: 0 % (ref 0.0–0.2)

## 2024-02-03 LAB — CMP (CANCER CENTER ONLY)
ALT: 17 U/L (ref 0–44)
AST: 23 U/L (ref 15–41)
Albumin: 3.1 g/dL — ABNORMAL LOW (ref 3.5–5.0)
Alkaline Phosphatase: 127 U/L — ABNORMAL HIGH (ref 38–126)
Anion gap: 9 (ref 5–15)
BUN: 5 mg/dL — ABNORMAL LOW (ref 6–20)
CO2: 20 mmol/L — ABNORMAL LOW (ref 22–32)
Calcium: 8.2 mg/dL — ABNORMAL LOW (ref 8.9–10.3)
Chloride: 109 mmol/L (ref 98–111)
Creatinine: 0.63 mg/dL (ref 0.44–1.00)
GFR, Estimated: >60 mL/min (ref >60)
Glucose, Bld: 128 mg/dL — ABNORMAL HIGH (ref 70–99)
Potassium: 3.2 mmol/L — ABNORMAL LOW (ref 3.5–5.1)
Sodium: 138 mmol/L (ref 135–145)
Total Bilirubin: 0.2 mg/dL (ref 0.0–1.2)
Total Protein: 6.4 g/dL — ABNORMAL LOW (ref 6.5–8.1)

## 2024-02-03 LAB — IRON AND IRON BINDING CAPACITY (CC-WL,HP ONLY)
Iron: 54 ug/dL (ref 28–170)
Saturation Ratios: 26 % (ref 10.4–31.8)
TIBC: 206 ug/dL — ABNORMAL LOW (ref 250–450)
UIBC: 152 ug/dL (ref 148–442)

## 2024-02-03 LAB — SAVE SMEAR(SSMR), FOR PROVIDER SLIDE REVIEW

## 2024-02-03 LAB — FERRITIN: Ferritin: 1424 ng/mL — ABNORMAL HIGH (ref 11–307)

## 2024-02-03 MED ORDER — SODIUM CHLORIDE 0.9% FLUSH: 10.0000 mL | INTRAVENOUS | Status: DC | PRN

## 2024-02-03 MED ORDER — PALONOSETRON HCL INJECTION 0.25 MG/5ML
0.2500 mg | Freq: Once | INTRAVENOUS | Status: AC
  Administered 2024-02-03: 0.25 mg via INTRAVENOUS
  Filled 2024-02-03: qty 5

## 2024-02-03 MED ORDER — HEPARIN SOD (PORK) LOCK FLUSH 100 UNIT/ML IV SOLN
500.0000 [IU] | Freq: Once | INTRAVENOUS | Status: AC | PRN
  Administered 2024-02-03: 500 [IU]

## 2024-02-03 MED ORDER — DIPHENHYDRAMINE HCL 25 MG PO CAPS
50.0000 mg | ORAL_CAPSULE | Freq: Once | ORAL | Status: AC
  Administered 2024-02-03: 50 mg via ORAL
  Filled 2024-02-03: qty 2

## 2024-02-03 MED ORDER — SODIUM CHLORIDE 0.9% FLUSH
10.0000 mL | INTRAVENOUS | Status: DC | PRN
  Administered 2024-02-03: 10 mL

## 2024-02-03 MED ORDER — DEXAMETHASONE SODIUM PHOSPHATE 10 MG/ML IJ SOLN
10.0000 mg | Freq: Once | INTRAMUSCULAR | Status: AC
  Administered 2024-02-03: 10 mg via INTRAVENOUS
  Filled 2024-02-03: qty 1

## 2024-02-03 MED ORDER — DEXTROSE 5 % IV SOLN: INTRAVENOUS | Status: DC

## 2024-02-03 MED ORDER — ACETAMINOPHEN 325 MG PO TABS
650.0000 mg | ORAL_TABLET | Freq: Once | ORAL | Status: AC
  Administered 2024-02-03: 650 mg via ORAL
  Filled 2024-02-03: qty 2

## 2024-02-03 MED ORDER — SODIUM CHLORIDE 0.9 % IV SOLN
150.0000 mg | Freq: Once | INTRAVENOUS | Status: AC
  Administered 2024-02-03: 150 mg via INTRAVENOUS
  Filled 2024-02-03: qty 150

## 2024-02-03 MED ORDER — FAM-TRASTUZUMAB DERUXTECAN-NXKI CHEMO 100 MG IV SOLR
5.4000 mg/kg | Freq: Once | INTRAVENOUS | Status: AC
  Administered 2024-02-03: 300 mg via INTRAVENOUS
  Filled 2024-02-03: qty 15

## 2024-02-03 NOTE — Progress Notes (Signed)
 Is here Hematology and Oncology Follow Up Visit  Briana [name] [id number] 1975/11/20 47 y.o. 02/03/2024   Principle Diagnosis:  Stage IIIC (T4N3M0) - TRIPLE NEGATIVE - infiltrating ductal carcinoma of the left breast -metastatic to bone -- HER2 2+ (IHC) HIV (+) Iron  deficiency anemia  Current Therapy:   Neoadjuvant  chemoimmunotherapy --- carboplatin /Taxol /pembrolizumab  ---s/p cycle #3 - start on 02/19/2023 S/p LEFT MRM - 06/08/2023 --  Residual breast cancer - 3/3 nodes (-) XRT -- Start on 07/22/2023 --completed on 09/15/2023 Xeloda  - s/p cycle #3 on 09/28/2023 Zometa  4 mg IV every 3 months-next dose to be given on 03/2024 Enhertu  5.4 mg/m IV every 3 weeks-s/p cycle #1  -- start on 01/13/2024 Radiotherapy to multiple sites-completed on 01/21/2024 IV iron -Venofer  given on 01/13/2024     Interim History:  Briana [name] is back for follow-up.  She had a lymph node biopsy done yesterday.  This was of a lymph node in the right side of her neck.  Hopefully, we will have results back.  We will then see about sending this off for molecular analysis.  She is still working.  She is working in Office manager at [employer].  She did have radiotherapy.  She had radiotherapy to multiple sites.  She had radiotherapy to the T10 vertebral body.  She had radiation therapy to some right ribs.  There is radiotherapy to the left hip and sacrum.  Again she tolerated this quite well.  She has had no issues with nausea or vomiting.  She has had some diarrhea.  I told that she can take up to 8 Imodium  a day..  She has had no cough.  She has had no problems with the HIV.  She is on medication for this.  She has had no fever.  There is been no rashes.  She has had no leg swelling.  She has had no bleeding.  Overall, I would have said that her performance status is probably ECOG 1. .  Medications:  Current Outpatient Medications:    amLODipine  (NORVASC ) 10 MG tablet, TAKE 1 TABLET(10 MG) BY MOUTH DAILY,  Disp: 90 tablet, Rfl: 0   bictegravir-emtricitabine -tenofovir  AF (BIKTARVY ) 50-200-25 MG TABS tablet, Take 1 tablet by mouth daily., Disp: 90 tablet, Rfl: 3   dexamethasone  (DECADRON ) 4 MG tablet, Take 2 tablets (8 mg) by mouth daily for 3 days starting the day after chemotherapy. Take with food., Disp: 30 tablet, Rfl: 1   dronabinol  (MARINOL ) 10 MG capsule, Dronabinol  10 mg by mouth daily, Disp: 30 capsule, Rfl: 0   DUPIXENT 300 MG/2ML SOAJ, , Disp: , Rfl:    lidocaine -prilocaine  (EMLA ) cream, Apply to affected area once, Disp: 30 g, Rfl: 3   megestrol  (MEGACE  ES) 625 MG/5ML suspension, Take 5 mLs (625 mg total) by mouth daily., Disp: 150 mL, Rfl: 3   methocarbamol  (ROBAXIN ) 500 MG tablet, Take 1 tablet (500 mg total) by mouth every 8 (eight) hours as needed for muscle spasms. (Patient not taking: Reported on 01/06/2024), Disp: 20 tablet, Rfl: 0   ondansetron  (ZOFRAN ) 8 MG tablet, Take 1 tablet (8 mg total) by mouth every 8 (eight) hours as needed for nausea or vomiting. Start on the third day after chemotherapy., Disp: 30 tablet, Rfl: 1   oxyCODONE -acetaminophen  (PERCOCET/ROXICET) 5-325 MG tablet, Take 1 tablet by mouth every 8 (eight) hours as needed for severe pain (pain score 7-10)., Disp: 90 tablet, Rfl: 0   polyethylene glycol (MIRALAX  / GLYCOLAX ) 17 g packet, Take 17 g by mouth  daily as needed for moderate constipation., Disp: 14 each, Rfl: 0   potassium chloride  SA (KLOR-CON  M) 20 MEQ tablet, Take 1 tablet (20 mEq total) by mouth 2 (two) times daily., Disp: , Rfl:    prochlorperazine  (COMPAZINE ) 10 MG tablet, Take 1 tablet (10 mg total) by mouth every 6 (six) hours as needed for nausea or vomiting., Disp: 30 tablet, Rfl: 1   senna-docusate (SENOKOT-S) 8.6-50 MG tablet, Take 1 tablet by mouth 2 (two) times daily., Disp: 30 tablet, Rfl: 0   triamcinolone  (KENALOG ) 0.025 % ointment, Apply topically 2 (two) times daily as needed. (Patient taking differently: Apply 1 Application topically as needed  (eczema).), Disp: 454 g, Rfl: 5 No current facility-administered medications for this visit.  Facility-Administered Medications Ordered in Other Visits:    0.9 %  sodium chloride  infusion, , Intravenous, Continuous, Tish Forge, NP, Stopped at 01/13/24 1502   dextrose  5 % solution, , Intravenous, Continuous, Luis Sami, Sherryll Donald, MD, Last Rate: 400 mL/hr at 01/13/24 1652, Infusion Verify at 01/13/24 1652   sodium chloride  flush (NS) 0.9 % injection 10 mL, 10 mL, Intracatheter, PRN, Retta Pitcher R, MD, 10 mL at 01/13/24 1648  Allergies:  Allergies  Allergen Reactions   Pumpkin Flavoring Agent (Non-Screening) Swelling    ONLY Lip swelling after eating pumpkin pie    Past Medical History, Surgical history, Social history, and Family History were reviewed and updated.  Review of Systems: Review of Systems  Constitutional: Negative.   HENT:  Negative.    Eyes: Negative.   Respiratory: Negative.    Cardiovascular: Negative.   Gastrointestinal: Negative.   Endocrine: Negative.   Genitourinary: Negative.    Musculoskeletal: Negative.   Skin: Negative.   Neurological: Negative.   Hematological: Negative.   Psychiatric/Behavioral: Negative.      Physical Exam: Temperature is 98.3.  Pulse 100.  Blood pressure 136/98.  Weight is 135 pounds.    Wt Readings from Last 3 Encounters:  02/03/24 135 lb (61.2 kg)  02/02/24 136 lb (61.7 kg)  01/06/24 130 lb 1.9 oz (59 kg)    Physical Exam Vitals reviewed.  Constitutional:      Comments: Right breast shows no masses edema or erythema.  There is no right axillary adenopathy.  Left breast really does not show any masses.  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  There is some slight skin breakdown.  There is no exudate.  I cannot palpate any distinct mass in the left breast.   I cannot palpate any left axillary lymph nodes.    HENT:     Head: Normocephalic and atraumatic.  Eyes:     Pupils: Pupils are equal, round,  and reactive to light.  Cardiovascular:     Rate and Rhythm: Normal rate and regular rhythm.     Heart sounds: Normal heart sounds.  Pulmonary:     Effort: Pulmonary effort is normal.     Breath sounds: Normal breath sounds.  Abdominal:     General: Bowel sounds are normal.     Palpations: Abdomen is soft.  Musculoskeletal:        General: No tenderness or deformity. Normal range of motion.     Cervical back: Normal range of motion.  Lymphadenopathy:     Cervical: No cervical adenopathy.  Skin:    General: Skin is warm and dry.     Findings: No erythema or rash.  Neurological:     Mental Status: She is alert  and oriented to person, place, and time.  Psychiatric:        Behavior: Behavior normal.        Thought Content: Thought content normal.        Judgment: Judgment normal.      Lab Results  Component Value Date   WBC 5.2 02/03/2024   HGB 9.5 (L) 02/03/2024   HCT 30.3 (L) 02/03/2024   MCV 87.6 02/03/2024   PLT 172 02/03/2024     Chemistry      Component Value Date/Time   NA 138 02/03/2024 0810   K 3.2 (L) 02/03/2024 0810   CL 109 02/03/2024 0810   CO2 20 (L) 02/03/2024 0810   BUN 5 (L) 02/03/2024 0810   CREATININE 0.63 02/03/2024 0810   CREATININE 0.62 10/17/2022 0947      Component Value Date/Time   CALCIUM 8.2 (L) 02/03/2024 0810   ALKPHOS 127 (H) 02/03/2024 0810   AST 23 02/03/2024 0810   ALT 17 02/03/2024 0810   BILITOT 0.2 02/03/2024 0810     Impression and Plan: Ms. Burklow is a very nice 48 year old premenopausal Afro-American female with TRIPLE NEGATIVE breast cancer.  She has aggressive disease in my opinion.  She has already recurred.  She has recurred in her bones and in her lymph nodes.  We get the biopsy.  Again we will see what the molecular analysis shows us ..  Will continue on the Enhertu .  I do think she is tolerating this pretty well.  Of note, her last CA 27.29 that was done about a month ago was 17.2.  Again, this is all about quality  of life.  1 make sure that we continue to focus on her quality of life.  I think radiotherapy has helped.  Hopefully, the Enhertu  will make a difference.  We probably will go ahead and give her 4 cycles of Enhertu  after that, we will then see about doing our scans.  I will plan to get her back in another 3 weeks.  Ivor Mars, MD 6/4/202510:29 AM

## 2024-02-03 NOTE — Patient Instructions (Signed)
 [facility] - A DEPT OF [facility]  Discharge Instructions: Thank you for choosing [facility] to provide your oncology and hematology care.   If you have a lab appointment with the Cancer Center, please go directly to the Cancer Center and check in at the registration area.  Wear comfortable clothing and clothing appropriate for easy access to any Portacath or PICC line.   We strive to give you quality time with your provider. You may need to reschedule your appointment if you arrive late (15 or more minutes).  Arriving late affects you and other patients whose appointments are after yours.  Also, if you miss three or more appointments without notifying the office, you may be dismissed from the clinic at the provider's discretion.      For prescription refill requests, have your pharmacy contact our office and allow 72 hours for refills to be completed.    Today you received the following chemotherapy and/or immunotherapy agents Enhertu      To help prevent nausea and vomiting after your treatment, we encourage you to take your nausea medication as directed.  BELOW ARE SYMPTOMS THAT SHOULD BE REPORTED IMMEDIATELY: *FEVER GREATER THAN 100.4 F (38 C) OR HIGHER *CHILLS OR SWEATING *NAUSEA AND VOMITING THAT IS NOT CONTROLLED WITH YOUR NAUSEA MEDICATION *UNUSUAL SHORTNESS OF BREATH *UNUSUAL BRUISING OR BLEEDING *URINARY PROBLEMS (pain or burning when urinating, or frequent urination) *BOWEL PROBLEMS (unusual diarrhea, constipation, pain near the anus) TENDERNESS IN MOUTH AND THROAT WITH OR WITHOUT PRESENCE OF ULCERS (sore throat, sores in mouth, or a toothache) UNUSUAL RASH, SWELLING OR PAIN  UNUSUAL VAGINAL DISCHARGE OR ITCHING   Items with * indicate a potential emergency and should be followed up as soon as possible or go to the Emergency Department if any problems should occur.  Please show the CHEMOTHERAPY ALERT CARD or IMMUNOTHERAPY  ALERT CARD at check-in to the Emergency Department and triage nurse. Should you have questions after your visit or need to cancel or reschedule your appointment, please contact [facility] - A DEPT OF [facility]  [phone] and follow the prompts.  Office hours are 8:00 a.m. to 4:30 p.m. Monday - Friday. Please note that voicemails left after 4:00 p.m. may not be returned until the following business day.  We are closed weekends and major holidays. You have access to a nurse at all times for urgent questions. Please call the main number to the clinic [phone] and follow the prompts.  For any non-urgent questions, you may also contact your provider using MyChart. We now offer e-Visits for anyone 48 and older to request care online for non-urgent symptoms. For details visit [email or web address].   Also download the MyChart app! Go to the app store, search "MyChart", open the app, select , and log in with your MyChart username and password.

## 2024-02-03 NOTE — Patient Instructions (Signed)

## 2024-02-04 ENCOUNTER — Encounter: Payer: Self-pay | Admitting: Family

## 2024-02-04 ENCOUNTER — Encounter: Payer: Self-pay | Admitting: Hematology & Oncology

## 2024-02-04 LAB — CANCER ANTIGEN 27.29: CA 27.29: 10.2 U/mL (ref 0.0–38.6)

## 2024-02-04 NOTE — Telephone Encounter (Signed)
 Advised via MyChart.

## 2024-02-04 NOTE — Telephone Encounter (Signed)
-----   Message from Ivor Mars sent at 02/04/2024  9:08 AM EDT ----- Please call and let her know that the tumor marker went from 17 down to 10.  This should be a good sign.  Twilla Galea

## 2024-02-08 ENCOUNTER — Other Ambulatory Visit: Payer: Self-pay | Admitting: Infectious Diseases

## 2024-02-08 ENCOUNTER — Ambulatory Visit (HOSPITAL_BASED_OUTPATIENT_CLINIC_OR_DEPARTMENT_OTHER)

## 2024-02-08 DIAGNOSIS — B20 Human immunodeficiency virus [HIV] disease: Secondary | ICD-10-CM

## 2024-02-09 LAB — SURGICAL PATHOLOGY

## 2024-02-16 ENCOUNTER — Encounter: Payer: Self-pay | Admitting: Family

## 2024-02-16 ENCOUNTER — Encounter: Payer: Self-pay | Admitting: Hematology & Oncology

## 2024-02-18 ENCOUNTER — Encounter: Payer: Self-pay | Admitting: Radiation Oncology

## 2024-02-22 ENCOUNTER — Ambulatory Visit
Admission: RE | Admit: 2024-02-22 | Discharge: 2024-02-22 | Disposition: A | Discharge status: Home / Self Care | Source: Ambulatory Visit | Attending: Radiation Oncology | Admitting: Radiation Oncology

## 2024-02-22 NOTE — Progress Notes (Incomplete)
 Radiation Oncology         [phone] ________________________________  Name: Briana [name] MRN: [id number]  Date: 02/22/2024  DOB: 1976-01-18  Follow-Up Visit Note  CC: Almarie Waddell NOVAK, NP  Almarie Waddell NOVAK, NP  No diagnosis found.  Diagnosis: The primary encounter diagnosis was Metastasis to bone [facility]). A diagnosis of Stage IV breast cancer in female [facility]) was also pertinent to this visit.   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  -- Invasive ductal carcinoma : ER+ / PR+ / Her2 - / Grade 3.    Bony metastases from stage IV invasive lobular/ductal carcinoma of the left breast  Interval Since Last Radiation: 1 month and 1 day   Intent: Palliative  Radiation Treatment Dates: First Treatment Date: 2024-01-07 -- Last Treatment Date: 2024-01-21 Site/Dose/Technique/Mode:  Plan Name: Spine_T10 Site: Thoracic Spine Technique: 3D Mode: Photon Dose Per Fraction: 3 Gy Prescribed Dose (Delivered / Prescribed): 30 Gy / 30 Gy Prescribed Fxs (Delivered / Prescribed): 10 / 10   Plan Name: Chest_R Site: Ribs, Right Technique: 3D Mode: Photon Dose Per Fraction: 3 Gy Prescribed Dose (Delivered / Prescribed): 30 Gy / 30 Gy Prescribed Fxs (Delivered / Prescribed): 10 / 10   Plan Name: Spine_Sacrum Site: Sacrum Technique: Isodose Plan Mode: Photon Dose Per Fraction: 3 Gy Prescribed Dose (Delivered / Prescribed): 30 Gy / 30 Gy Prescribed Fxs (Delivered / Prescribed): 10 / 10   Plan Name: Pelvis_L Site: Hip, Left Technique: Isodose Plan Mode: Photon Dose Per Fraction: 3 Gy Prescribed Dose (Delivered / Prescribed): 30 Gy / 30 Gy Prescribed Fxs (Delivered / Prescribed): 10 / 10   Narrative:  The patient returns today for routine follow-up.  She tolerated radiation therapy relatively well other than fatigue throughout her treatment. She also experienced nausea and vomiting from her chemotherapy. Overall, she reports that her pain improved with radiation therapy.   To review, she was initially undergoing chemotherapy with Pembrolizumab , Paclitaxel , Carboplatin , and Filgrastim at the time of her re-consultation visit on 12/31/23. Given her significant disease progression Dr. Ennever discontinued this regimen and transitioned her to Enhertu  starting on 01/13/24 which has tolerated well thus far.   In most recent history, a right neck lymph node biopsy was collected on 02/02/24. Pathology showed metastatic carcinoma, consistent with metastatic breast carcinoma. (Of note: Biopsy ordered per Dr. Timmy to obtain additional molecular information. Results from molecular analyses are pending at this time).   She received her most recent cycle of Enhertu  on 02/03/24. Dr. Timmy would like her to receive 4 more cycles followed by repeat imaging to assess her treatment response.   ***  Allergies:  is allergic to pumpkin flavoring agent (non-screening).  Meds: Current Outpatient Medications  Medication Sig Dispense Refill   amLODipine  (NORVASC ) 10 MG tablet TAKE 1 TABLET(10 MG) BY MOUTH DAILY 90 tablet 0   BIKTARVY  50-200-25 MG TABS tablet TAKE 1 TABLET DAILY 30 tablet 4   dexamethasone  (DECADRON ) 4 MG tablet Take 2 tablets (8 mg) by mouth daily for 3 days starting the day after chemotherapy. Take with food. 30 tablet 1   dronabinol  (MARINOL ) 10 MG capsule Dronabinol  10 mg by mouth daily 30 capsule 0   DUPIXENT 300 MG/2ML SOAJ      lidocaine -prilocaine  (EMLA )  cream Apply to affected area once 30 g 3   megestrol  (MEGACE  ES) 625 MG/5ML suspension Take 5 mLs (625 mg total) by mouth daily. 150 mL 3   methocarbamol  (ROBAXIN ) 500 MG tablet Take 1 tablet (500 mg total) by mouth every 8 (eight) hours as needed for muscle spasms. (Patient not taking:  Reported on 01/06/2024) 20 tablet 0   ondansetron  (ZOFRAN ) 8 MG tablet Take 1 tablet (8 mg total) by mouth every 8 (eight) hours as needed for nausea or vomiting. Start on the third day after chemotherapy. 30 tablet 1   oxyCODONE -acetaminophen  (PERCOCET/ROXICET) 5-325 MG tablet Take 1 tablet by mouth every 8 (eight) hours as needed for severe pain (pain score 7-10). 90 tablet 0   polyethylene glycol (MIRALAX  / GLYCOLAX ) 17 g packet Take 17 g by mouth daily as needed for moderate constipation. 14 each 0   potassium chloride  SA (KLOR-CON  M) 20 MEQ tablet Take 1 tablet (20 mEq total) by mouth 2 (two) times daily.     prochlorperazine  (COMPAZINE ) 10 MG tablet Take 1 tablet (10 mg total) by mouth every 6 (six) hours as needed for nausea or vomiting. 30 tablet 1   senna-docusate (SENOKOT-S) 8.6-50 MG tablet Take 1 tablet by mouth 2 (two) times daily. 30 tablet 0   triamcinolone  (KENALOG ) 0.025 % ointment Apply topically 2 (two) times daily as needed. (Patient taking differently: Apply 1 Application topically as needed (eczema).) 454 g 5   No current facility-administered medications for this encounter.   Facility-Administered Medications Ordered in Other Encounters  Medication Dose Route Frequency Provider Last Rate Last Admin   0.9 %  sodium chloride  infusion   Intravenous Continuous Franchot Lauraine HERO, NP   Stopped at 01/13/24 1502   dextrose  5 % solution   Intravenous Continuous Timmy Maude SAUNDERS, MD 400 mL/hr at 01/13/24 1652 Infusion Verify at 01/13/24 1652   sodium chloride  flush (NS) 0.9 % injection 10 mL  10 mL Intracatheter PRN Timmy Maude SAUNDERS, MD   10 mL at 01/13/24 1648    Physical Findings: The patient is in no acute distress. Patient is alert and oriented.  vitals were not taken for this visit. .  No significant changes. Lungs are clear to auscultation bilaterally. Heart has regular rate and rhythm. No palpable cervical, supraclavicular, or axillary adenopathy. Abdomen soft, non-tender, normal  bowel sounds.   Lab Findings: Lab Results  Component Value Date   WBC 5.2 02/03/2024   HGB 9.5 (L) 02/03/2024   HCT 30.3 (L) 02/03/2024   MCV 87.6 02/03/2024   PLT 172 02/03/2024    Radiographic Findings: US  CORE BIOPSY (SOFT TISSUE) Result Date: 02/02/2024 CLINICAL DATA:  Recurrent breast cancer in a patient with right neck adenopathy EXAM: MUSCLE/SOFT TISSUE CORE BIOPSY ANESTHESIA/SEDATION: 1% lidocaine  MEDICATIONS: None PROCEDURE: Ultrasound-guided core needle biopsy COMPLICATIONS: None immediate FINDINGS: With the patient in the supine position, ultrasound was used to evaluate the right neck in the region of interest. Multiple enlarged hypoechoic lymph nodes were identified in the region of known adenopathy. The patient's skin was marked, prepped, and draped in the usual sterile fashion. Soft tissue was then infiltrated with 1% lidocaine  for local anesthesia. A needle was advanced from the skin to the nodule under ultrasound guidance. An 18 gauge biopsy needle was then advanced under ultrasound guidance to the proximal edge of the lymph node. A sample was then obtained, however the sample yielded scant material secondary to the mobility of the lymph node within this region of soft tissue.  Two more times were made obtaining minimal material for diagnosis. Access was then exchanged for a 20 gauge biopsy needle which was advanced ease ear into the lymph node and a larger sample was obtained. All needles removed from the patient. Sterile dressing applied. IMPRESSION: Successful core needle biopsy of a right neck lymph node. 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  -- Invasive ductal carcinoma : ER+ / PR+ / Her2 - / Grade 3.     The patient is recovering from the effects of radiation.  ***  Plan:  ***   *** minutes of total time was spent for this patient encounter, including preparation, face-to-face counseling with the patient and coordination of care, physical exam, and documentation of the encounter. ____________________________________  Lynwood CHARM Nasuti, PhD, MD  This document serves as a record of services personally performed by Lynwood Nasuti, MD. It was created on his behalf by Dorthy Fuse, a trained medical scribe. The creation of this record is based on the scribe's personal observations and the provider's statements to them. This document has been checked and approved by the attending provider.

## 2024-02-22 NOTE — Progress Notes (Incomplete)
 Briana [name] is here today for follow up post radiation to the thoracic spine, right chest, sacrum and left hip. Patient completed treatment on 01/21/24.     Does the patient complain of any of the following: Pain:*** Shortness of breath w/wo exertion: *** Cough: *** Hemoptysis: *** Pain with swallowing: *** Swallowing/choking concerns: *** Appetite: *** Energy Level: *** Post radiation skin Changes: *** Bowel/bladder: Nausea/Vomiting:     Additional comments if applicable:

## 2024-02-24 ENCOUNTER — Inpatient Hospital Stay (HOSPITAL_BASED_OUTPATIENT_CLINIC_OR_DEPARTMENT_OTHER): Admitting: Hematology & Oncology

## 2024-02-24 ENCOUNTER — Inpatient Hospital Stay

## 2024-02-24 ENCOUNTER — Ambulatory Visit (HOSPITAL_BASED_OUTPATIENT_CLINIC_OR_DEPARTMENT_OTHER)
Admission: RE | Admit: 2024-02-24 | Discharge: 2024-02-24 | Disposition: A | Discharge status: Home / Self Care | Source: Ambulatory Visit | Attending: Hematology & Oncology | Admitting: Hematology & Oncology

## 2024-02-24 ENCOUNTER — Encounter: Payer: Self-pay | Admitting: Hematology & Oncology

## 2024-02-24 VITALS — BP 119/78 | HR 110 | Temp 98.0°F | Resp 20 | Ht 65.0 in | Wt 125.0 lb

## 2024-02-24 VITALS — BP 98/62 | HR 89 | Temp 89.0°F

## 2024-02-24 DIAGNOSIS — C773 Secondary and unspecified malignant neoplasm of axilla and upper limb lymph nodes: Secondary | ICD-10-CM | POA: Diagnosis not present

## 2024-02-24 DIAGNOSIS — C50919 Malignant neoplasm of unspecified site of unspecified female breast: Secondary | ICD-10-CM

## 2024-02-24 DIAGNOSIS — C50912 Malignant neoplasm of unspecified site of left female breast: Secondary | ICD-10-CM

## 2024-02-24 DIAGNOSIS — Z21 Asymptomatic human immunodeficiency virus [HIV] infection status: Secondary | ICD-10-CM | POA: Diagnosis not present

## 2024-02-24 LAB — CBC WITH DIFFERENTIAL (CANCER CENTER ONLY)
Abs Immature Granulocytes: 0.13 10*3/uL — ABNORMAL HIGH (ref 0.00–0.07)
Basophils Absolute: 0.1 10*3/uL (ref 0.0–0.1)
Basophils Relative: 1 %
Eosinophils Absolute: 0.9 10*3/uL — ABNORMAL HIGH (ref 0.0–0.5)
Eosinophils Relative: 13 %
HCT: 29.2 % — ABNORMAL LOW (ref 36.0–46.0)
Hemoglobin: 9.4 g/dL — ABNORMAL LOW (ref 12.0–15.0)
Immature Granulocytes: 2 %
Lymphocytes Relative: 12 %
Lymphs Abs: 0.8 10*3/uL (ref 0.7–4.0)
MCH: 27.6 pg (ref 26.0–34.0)
MCHC: 32.2 g/dL (ref 30.0–36.0)
MCV: 85.6 fL (ref 80.0–100.0)
Monocytes Absolute: 0.7 10*3/uL (ref 0.1–1.0)
Monocytes Relative: 11 %
Neutro Abs: 4.1 10*3/uL (ref 1.7–7.7)
Neutrophils Relative %: 61 %
Platelet Count: 274 10*3/uL (ref 150–400)
RBC: 3.41 MIL/uL — ABNORMAL LOW (ref 3.87–5.11)
RDW: 16.5 % — ABNORMAL HIGH (ref 11.5–15.5)
WBC Count: 6.6 10*3/uL (ref 4.0–10.5)
nRBC: 0 % (ref 0.0–0.2)

## 2024-02-24 LAB — CMP (CANCER CENTER ONLY)
ALT: 18 U/L (ref 0–44)
AST: 25 U/L (ref 15–41)
Albumin: 3.7 g/dL (ref 3.5–5.0)
Alkaline Phosphatase: 151 U/L — ABNORMAL HIGH (ref 38–126)
Anion gap: 10 (ref 5–15)
BUN: 9 mg/dL (ref 6–20)
CO2: 26 mmol/L (ref 22–32)
Calcium: 9 mg/dL (ref 8.9–10.3)
Chloride: 99 mmol/L (ref 98–111)
Creatinine: 0.68 mg/dL (ref 0.44–1.00)
GFR, Estimated: >60 mL/min (ref >60)
Glucose, Bld: 97 mg/dL (ref 70–99)
Potassium: 3.2 mmol/L — ABNORMAL LOW (ref 3.5–5.1)
Sodium: 135 mmol/L (ref 135–145)
Total Bilirubin: 0.4 mg/dL (ref 0.0–1.2)
Total Protein: 7.3 g/dL (ref 6.5–8.1)

## 2024-02-24 LAB — IRON AND IRON BINDING CAPACITY (CC-WL,HP ONLY)
Iron: 52 ug/dL (ref 28–170)
Saturation Ratios: 24 % (ref 10.4–31.8)
TIBC: 218 ug/dL — ABNORMAL LOW (ref 250–450)
UIBC: 166 ug/dL (ref 148–442)

## 2024-02-24 LAB — RETICULOCYTES
Immature Retic Fract: 23.5 % — ABNORMAL HIGH (ref 2.3–15.9)
RBC.: 3.39 MIL/uL — ABNORMAL LOW (ref 3.87–5.11)
Retic Count, Absolute: 113.9 10*3/uL (ref 19.0–186.0)
Retic Ct Pct: 3.4 % — ABNORMAL HIGH (ref 0.4–3.1)

## 2024-02-24 LAB — FERRITIN: Ferritin: 2883 ng/mL — ABNORMAL HIGH (ref 11–307)

## 2024-02-24 MED ORDER — POTASSIUM CHLORIDE CRYS ER 20 MEQ PO TBCR
40.0000 meq | EXTENDED_RELEASE_TABLET | Freq: Once | ORAL | Status: AC
  Administered 2024-02-24: 40 meq via ORAL
  Filled 2024-02-24: qty 2

## 2024-02-24 MED ORDER — HYDROXYZINE HCL 25 MG PO TABS: 25.0000 mg | ORAL_TABLET | Freq: Three times a day (TID) | ORAL | 3 refills | Status: DC | PRN

## 2024-02-24 MED ORDER — DIPHENHYDRAMINE HCL 25 MG PO CAPS
50.0000 mg | ORAL_CAPSULE | Freq: Once | ORAL | Status: AC
  Administered 2024-02-24: 50 mg via ORAL
  Filled 2024-02-24: qty 2

## 2024-02-24 MED ORDER — ACETAMINOPHEN 325 MG PO TABS
650.0000 mg | ORAL_TABLET | Freq: Once | ORAL | Status: AC
  Administered 2024-02-24: 650 mg via ORAL
  Filled 2024-02-24: qty 2

## 2024-02-24 MED ORDER — HEPARIN SOD (PORK) LOCK FLUSH 100 UNIT/ML IV SOLN
500.0000 [IU] | Freq: Once | INTRAVENOUS | Status: AC | PRN
  Administered 2024-02-24: 500 [IU]

## 2024-02-24 MED ORDER — OXYCODONE-ACETAMINOPHEN 5-325 MG PO TABS: 1.0000 | ORAL_TABLET | Freq: Three times a day (TID) | ORAL | 0 refills | Status: DC | PRN

## 2024-02-24 MED ORDER — SODIUM CHLORIDE 0.9 % IV SOLN
150.0000 mg | Freq: Once | INTRAVENOUS | Status: AC
  Administered 2024-02-24: 150 mg via INTRAVENOUS
  Filled 2024-02-24: qty 150

## 2024-02-24 MED ORDER — SODIUM CHLORIDE 0.9% FLUSH
10.0000 mL | INTRAVENOUS | Status: DC | PRN
  Administered 2024-02-24: 10 mL

## 2024-02-24 MED ORDER — FAM-TRASTUZUMAB DERUXTECAN-NXKI CHEMO 100 MG IV SOLR
5.4000 mg/kg | Freq: Once | INTRAVENOUS | Status: AC
  Administered 2024-02-24: 300 mg via INTRAVENOUS
  Filled 2024-02-24: qty 15

## 2024-02-24 MED ORDER — DEXAMETHASONE SODIUM PHOSPHATE 10 MG/ML IJ SOLN
10.0000 mg | Freq: Once | INTRAMUSCULAR | Status: AC
  Administered 2024-02-24: 10 mg via INTRAVENOUS
  Filled 2024-02-24: qty 1

## 2024-02-24 MED ORDER — DEXTROSE 5 % IV SOLN: INTRAVENOUS | Status: DC

## 2024-02-24 MED ORDER — PALONOSETRON HCL INJECTION 0.25 MG/5ML
0.2500 mg | Freq: Once | INTRAVENOUS | Status: AC
  Administered 2024-02-24: 0.25 mg via INTRAVENOUS
  Filled 2024-02-24: qty 5

## 2024-02-24 NOTE — Progress Notes (Signed)
 Is here Hematology and Oncology Follow Up Visit  AILED DEFIBAUGH [id number] Jan 04, 1976 47 y.o. 02/24/2024   Principle Diagnosis:  Stage IIIC (T4N3M0) - TRIPLE NEGATIVE - infiltrating ductal carcinoma of the left breast -metastatic to bone -- HER2 2+ ([facility]) HIV (+) Iron  deficiency anemia  Current Therapy:   Neoadjuvant  chemoimmunotherapy --- carboplatin /Taxol /pembrolizumab  ---s/p cycle #3 - start on 02/19/2023 S/p LEFT MRM - 06/08/2023 --  Residual breast cancer - 3/3 nodes (-) XRT -- Start on 07/22/2023 --completed on 09/15/2023 Xeloda  - s/p cycle #3 on 09/28/2023 Zometa  4 mg IV every 3 months-next dose to be given on 03/2024 Enhertu  5.4 mg/m IV every 3 weeks-s/p cycle #2  -- start on 01/13/2024 Radiotherapy to multiple sites-completed on 01/21/2024 IV iron -Venofer  given on 01/13/2024     Interim History:  Ms. Currin is back for follow-up.  The main problem that I see is her weight loss.  She is lost 10 pounds.  She said that she had a lot of nausea and vomiting.  She was not taking the Decadron  as prescribed.  We went ahead and went over that with her again.  I am sure that this will be much better at this time.  She is having some pain over in the right lower lateral rib cage.  This does not radiate.  It is not associated with shortness of breath.  There is no cough.  We will go ahead and get a plain film to see what might be going on..  She did have a lymph node biopsy.  This was done on 02/02/2024.  The pathology report [id number]) showed metastatic carcinoma consistent with her breast cancer.  Surprisingly, it was CD30 positive.  She has had no diarrhea.  She has a lot of itching.  I will try her on some Atarax.  The pruritus is probably from her eczema.  She is still working.  I really applaud her for working.  I told her that she could certainly cut back on her hours if she needed to.  Her last tumor marker-CA 27.29-was only 10.2.  Currently, I would have to say that her  performance status is probably ECOG 2.     Medications:  Current Outpatient Medications:    amLODipine  (NORVASC ) 10 MG tablet, TAKE 1 TABLET(10 MG) BY MOUTH DAILY, Disp: 90 tablet, Rfl: 0   BIKTARVY  50-200-25 MG TABS tablet, TAKE 1 TABLET DAILY, Disp: 30 tablet, Rfl: 4   dexamethasone  (DECADRON ) 4 MG tablet, Take 2 tablets (8 mg) by mouth daily for 3 days starting the day after chemotherapy. Take with food., Disp: 30 tablet, Rfl: 1   dronabinol  (MARINOL ) 10 MG capsule, Dronabinol  10 mg by mouth daily, Disp: 30 capsule, Rfl: 0   DUPIXENT 300 MG/2ML SOAJ, , Disp: , Rfl:    lidocaine -prilocaine  (EMLA ) cream, Apply to affected area once, Disp: 30 g, Rfl: 3   megestrol  (MEGACE  ES) 625 MG/5ML suspension, Take 5 mLs (625 mg total) by mouth daily., Disp: 150 mL, Rfl: 3   methocarbamol  (ROBAXIN ) 500 MG tablet, Take 1 tablet (500 mg total) by mouth every 8 (eight) hours as needed for muscle spasms., Disp: 20 tablet, Rfl: 0   ondansetron  (ZOFRAN ) 8 MG tablet, Take 1 tablet (8 mg total) by mouth every 8 (eight) hours as needed for nausea or vomiting. Start on the third day after chemotherapy., Disp: 30 tablet, Rfl: 1   oxyCODONE -acetaminophen  (PERCOCET/ROXICET) 5-325 MG tablet, Take 1 tablet by mouth every 8 (eight) hours as needed for severe  pain (pain score 7-10)., Disp: 90 tablet, Rfl: 0   polyethylene glycol (MIRALAX  / GLYCOLAX ) 17 g packet, Take 17 g by mouth daily as needed for moderate constipation., Disp: 14 each, Rfl: 0   potassium chloride  SA (KLOR-CON  M) 20 MEQ tablet, Take 1 tablet (20 mEq total) by mouth 2 (two) times daily., Disp: , Rfl:    triamcinolone  (KENALOG ) 0.025 % ointment, Apply topically 2 (two) times daily as needed., Disp: 454 g, Rfl: 5   prochlorperazine  (COMPAZINE ) 10 MG tablet, Take 1 tablet (10 mg total) by mouth every 6 (six) hours as needed for nausea or vomiting. (Patient not taking: Reported on 02/24/2024), Disp: 30 tablet, Rfl: 1   senna-docusate (SENOKOT-S) 8.6-50 MG tablet,  Take 1 tablet by mouth 2 (two) times daily. (Patient not taking: Reported on 02/24/2024), Disp: 30 tablet, Rfl: 0 No current facility-administered medications for this visit.  Facility-Administered Medications Ordered in Other Visits:    0.9 %  sodium chloride  infusion, , Intravenous, Continuous, Franchot Lauraine HERO, NP, Stopped at 01/13/24 1502   dextrose  5 % solution, , Intravenous, Continuous, Vernee Baines, Maude SAUNDERS, MD, Last Rate: 400 mL/hr at 01/13/24 1652, Infusion Verify at 01/13/24 1652   sodium chloride  flush (NS) 0.9 % injection 10 mL, 10 mL, Intracatheter, PRN, Lema Heinkel R, MD, 10 mL at 01/13/24 1648  Allergies:  Allergies  Allergen Reactions   Pumpkin Flavoring Agent (Non-Screening) Swelling    ONLY Lip swelling after eating pumpkin pie    Past Medical History, Surgical history, Social history, and Family History were reviewed and updated.  Review of Systems: Review of Systems  Constitutional: Negative.   HENT:  Negative.    Eyes: Negative.   Respiratory: Negative.    Cardiovascular: Negative.   Gastrointestinal: Negative.   Endocrine: Negative.   Genitourinary: Negative.    Musculoskeletal: Negative.   Skin: Negative.   Neurological: Negative.   Hematological: Negative.   Psychiatric/Behavioral: Negative.      Physical Exam: Vital signs show temperature of 98.  Pulse 110.  Blood pressure 119/78.  Weight is 125 pounds.       Wt Readings from Last 3 Encounters:  02/24/24 125 lb (56.7 kg)  02/03/24 135 lb (61.2 kg)  02/02/24 136 lb (61.7 kg)    Physical Exam Vitals reviewed.  Constitutional:      Comments: Right breast shows no masses edema or erythema.  There is no right axillary adenopathy.  Left breast really does not show any masses.  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  There is some slight skin breakdown.  There is no exudate.  I cannot palpate any distinct mass in the left breast.   I cannot palpate any left axillary lymph nodes.     HENT:     Head: Normocephalic and atraumatic.   Eyes:     Pupils: Pupils are equal, round, and reactive to light.    Cardiovascular:     Rate and Rhythm: Normal rate and regular rhythm.     Heart sounds: Normal heart sounds.  Pulmonary:     Effort: Pulmonary effort is normal.     Breath sounds: Normal breath sounds.  Abdominal:     General: Bowel sounds are normal.     Palpations: Abdomen is soft.   Musculoskeletal:        General: No tenderness or deformity. Normal range of motion.     Cervical back: Normal range of motion.  Lymphadenopathy:     Cervical:  No cervical adenopathy.   Skin:    General: Skin is warm and dry.     Findings: No erythema or rash.   Neurological:     Mental Status: She is alert and oriented to person, place, and time.   Psychiatric:        Behavior: Behavior normal.        Thought Content: Thought content normal.        Judgment: Judgment normal.     Lab Results  Component Value Date   WBC 6.6 02/24/2024   HGB 9.4 (L) 02/24/2024   HCT 29.2 (L) 02/24/2024   MCV 85.6 02/24/2024   PLT 274 02/24/2024     Chemistry      Component Value Date/Time   NA 138 02/03/2024 0810   K 3.2 (L) 02/03/2024 0810   CL 109 02/03/2024 0810   CO2 20 (L) 02/03/2024 0810   BUN 5 (L) 02/03/2024 0810   CREATININE 0.63 02/03/2024 0810   CREATININE 0.62 10/17/2022 0947      Component Value Date/Time   CALCIUM 8.2 (L) 02/03/2024 0810   ALKPHOS 127 (H) 02/03/2024 0810   AST 23 02/03/2024 0810   ALT 17 02/03/2024 0810   BILITOT 0.2 02/03/2024 0810     Impression and Plan: Ms. Mccuistion is a very nice 48 year old premenopausal Afro-American female with TRIPLE NEGATIVE breast cancer.  She has aggressive disease in my opinion.  She has already recurred.  She has recurred in her bones and in her lymph nodes.  We did get the lymph node biopsy.  Surprisingly was CD30 positive.  We are sending this off her [facility] analysis.  Clearly, her tumor does not  produce much in the way of CA 27.29.  I am not sure we can use this as a marker for response.  We will have to see what the x-rays show of the right lateral rib cage.  We will proceed with the second cycle of Enhertu .  I will plan to get her back to see us  in another 3 weeks.  Again, I think is interesting that her tumor was CD30 positive.  As such, I am wondering if there might be a indication for Adcetris.  I am sure there is very little data about using Adcetris in this situation.    Maude JONELLE Crease, MD 6/25/20259:39 AM

## 2024-02-24 NOTE — Patient Instructions (Signed)

## 2024-02-24 NOTE — Patient Instructions (Signed)
 [facility] - A DEPT OF [facility]  Discharge Instructions: Thank you for choosing [facility] to provide your oncology and hematology care.   If you have a lab appointment with the Cancer Center, please go directly to the Cancer Center and check in at the registration area.  Wear comfortable clothing and clothing appropriate for easy access to any Portacath or PICC line.   We strive to give you quality time with your provider. You may need to reschedule your appointment if you arrive late (15 or more minutes).  Arriving late affects you and other patients whose appointments are after yours.  Also, if you miss three or more appointments without notifying the office, you may be dismissed from the clinic at the provider's discretion.      For prescription refill requests, have your pharmacy contact our office and allow 72 hours for refills to be completed.    Today you received the following chemotherapy and/or immunotherapy agents Enhertu      To help prevent nausea and vomiting after your treatment, we encourage you to take your nausea medication as directed.  BELOW ARE SYMPTOMS THAT SHOULD BE REPORTED IMMEDIATELY: *FEVER GREATER THAN 100.4 F (38 C) OR HIGHER *CHILLS OR SWEATING *NAUSEA AND VOMITING THAT IS NOT CONTROLLED WITH YOUR NAUSEA MEDICATION *UNUSUAL SHORTNESS OF BREATH *UNUSUAL BRUISING OR BLEEDING *URINARY PROBLEMS (pain or burning when urinating, or frequent urination) *BOWEL PROBLEMS (unusual diarrhea, constipation, pain near the anus) TENDERNESS IN MOUTH AND THROAT WITH OR WITHOUT PRESENCE OF ULCERS (sore throat, sores in mouth, or a toothache) UNUSUAL RASH, SWELLING OR PAIN  UNUSUAL VAGINAL DISCHARGE OR ITCHING   Items with * indicate a potential emergency and should be followed up as soon as possible or go to the Emergency Department if any problems should occur.  Please show the CHEMOTHERAPY ALERT CARD or IMMUNOTHERAPY  ALERT CARD at check-in to the Emergency Department and triage nurse. Should you have questions after your visit or need to cancel or reschedule your appointment, please contact [facility] - A DEPT OF [facility]  [phone] and follow the prompts.  Office hours are 8:00 a.m. to 4:30 p.m. Monday - Friday. Please note that voicemails left after 4:00 p.m. may not be returned until the following business day.  We are closed weekends and major holidays. You have access to a nurse at all times for urgent questions. Please call the main number to the clinic [phone] and follow the prompts.  For any non-urgent questions, you may also contact your provider using MyChart. We now offer e-Visits for anyone 48 and older to request care online for non-urgent symptoms. For details visit [email or web address].   Also download the MyChart app! Go to the app store, search "MyChart", open the app, select , and log in with your MyChart username and password.

## 2024-02-24 NOTE — Progress Notes (Shared)
 Radiation Oncology         [phone] ________________________________  Name: Briana [name] MRN: [id number]  Date: 02/25/2024  DOB: 03-26-1976  Follow-Up Visit Note  CC: Almarie Waddell NOVAK, NP  Almarie Waddell NOVAK, NP  No diagnosis found.  Diagnosis: The primary encounter diagnosis was Metastasis to bone [facility]). A diagnosis of Stage IV breast cancer in female [facility]) was also pertinent to this visit.   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  -- Invasive ductal carcinoma : ER+ / PR+ / Her2 - / Grade 3.    Bony metastases from stage IV invasive lobular/ductal carcinoma of the left breast; s/p palliative radiation to T10, right ribs, sacrum, and left pelvis completed on 01/21/2024  Interval Since Last Radiation: 1 month and 5 day   Intent: Palliative  Radiation Treatment Dates:  First Treatment Date: 2024-01-07 -- Last Treatment Date: 2024-01-21 Site/Dose/Technique/Mode:  Plan Name: Spine_T10 Site: Thoracic Spine Technique: 3D Mode: Photon Dose Per Fraction: 3 Gy Prescribed Dose (Delivered / Prescribed): 30 Gy / 30 Gy Prescribed Fxs (Delivered / Prescribed): 10 / 10   Plan Name: Chest_R Site: Ribs, Right Technique: 3D Mode: Photon Dose Per Fraction: 3 Gy Prescribed Dose (Delivered / Prescribed): 30 Gy / 30 Gy Prescribed Fxs (Delivered / Prescribed): 10 / 10   Plan Name: Spine_Sacrum Site: Sacrum Technique: Isodose Plan Mode: Photon Dose Per Fraction: 3 Gy Prescribed Dose (Delivered / Prescribed): 30 Gy / 30 Gy Prescribed Fxs (Delivered / Prescribed): 10 / 10   Plan Name: Pelvis_L Site: Hip, Left Technique: Isodose Plan Mode: Photon Dose Per Fraction: 3 Gy Prescribed Dose (Delivered / Prescribed): 30 Gy / 30 Gy Prescribed Fxs  (Delivered / Prescribed): 10 / 10   Narrative:  The patient returns today for routine follow-up. She tolerated radiation therapy relatively well other than fatigue throughout her treatment. She also experienced nausea and vomiting from her chemotherapy. Overall, she reports that her pain improved with radiation therapy.   To review, she was initially undergoing chemotherapy with Pembrolizumab , Paclitaxel , Carboplatin , and Filgrastim at the time of her re-consultation visit on 12/31/23. Given her significant disease progression Dr. Ennever discontinued this regimen and transitioned her to Enhertu  starting on 01/13/24 which has tolerated well thus far.   In most recent history, a right neck lymph node biopsy was collected on 02/02/24. Pathology showed metastatic carcinoma, consistent with metastatic breast carcinoma. Tumor was CD30 positive.   She received her most recent cycle of Enhertu  on 02/03/24. During her most recent follow up with  Dr. Timmy on 02/24/24, she complained of right lower lateral rib cage pain. He recommended X-ray of the rib cage for further evaluation and that she proceeds with her second cycle of Enhertu . X-ray of the ribs on 02/24/2024 demonstrated no signs of metastatic disease.    *** right leg pain has improved. ***  Allergies:  is allergic to pumpkin flavoring agent (non-screening).  Meds: Current Outpatient Medications  Medication Sig Dispense Refill   amLODipine  (NORVASC ) 10 MG tablet TAKE 1 TABLET(10 MG) BY MOUTH DAILY 90 tablet 0   BIKTARVY  50-200-25 MG TABS tablet TAKE 1 TABLET DAILY 30 tablet 4   dexamethasone  (DECADRON ) 4 MG tablet Take 2 tablets (8 mg) by mouth daily for 3 days starting  the day after chemotherapy. Take with food. 30 tablet 1   dronabinol  (MARINOL ) 10 MG capsule Dronabinol  10 mg by mouth daily 30 capsule 0   DUPIXENT 300 MG/2ML SOAJ  (Patient taking differently: Inject as directed 2 (two) times a week.)     hydrOXYzine (ATARAX) 25 MG tablet  Take 1 tablet (25 mg total) by mouth every 8 (eight) hours as needed. 60 tablet 3   lidocaine -prilocaine  (EMLA ) cream Apply to affected area once 30 g 3   megestrol  (MEGACE  ES) 625 MG/5ML suspension Take 5 mLs (625 mg total) by mouth daily. 150 mL 3   methocarbamol  (ROBAXIN ) 500 MG tablet Take 1 tablet (500 mg total) by mouth every 8 (eight) hours as needed for muscle spasms. 20 tablet 0   ondansetron  (ZOFRAN ) 8 MG tablet Take 1 tablet (8 mg total) by mouth every 8 (eight) hours as needed for nausea or vomiting. Start on the third day after chemotherapy. 30 tablet 1   oxyCODONE -acetaminophen  (PERCOCET/ROXICET) 5-325 MG tablet Take 1 tablet by mouth every 8 (eight) hours as needed for severe pain (pain score 7-10). 90 tablet 0   polyethylene glycol (MIRALAX  / GLYCOLAX ) 17 g packet Take 17 g by mouth daily as needed for moderate constipation. 14 each 0   potassium chloride  SA (KLOR-CON  M) 20 MEQ tablet Take 1 tablet (20 mEq total) by mouth 2 (two) times daily.     prochlorperazine  (COMPAZINE ) 10 MG tablet Take 1 tablet (10 mg total) by mouth every 6 (six) hours as needed for nausea or vomiting. (Patient not taking: Reported on 02/24/2024) 30 tablet 1   senna-docusate (SENOKOT-S) 8.6-50 MG tablet Take 1 tablet by mouth 2 (two) times daily. (Patient not taking: Reported on 02/24/2024) 30 tablet 0   triamcinolone  (KENALOG ) 0.025 % ointment Apply topically 2 (two) times daily as needed. 454 g 5   No current facility-administered medications for this encounter.   Facility-Administered Medications Ordered in Other Encounters  Medication Dose Route Frequency Provider Last Rate Last Admin   0.9 %  sodium chloride  infusion   Intravenous Continuous Franchot Lauraine HERO, NP   Stopped at 01/13/24 1502   dextrose  5 % solution   Intravenous Continuous Timmy Maude SAUNDERS, MD 400 mL/hr at 01/13/24 1652 Infusion Verify at 01/13/24 1652   dextrose  5 % solution   Intravenous Continuous Timmy Maude SAUNDERS, MD 10 mL/hr at 02/24/24  1047 New Bag at 02/24/24 1047   fam-trastuzumab deruxtecan-nxki  (ENHERTU ) 300 mg in dextrose  5 % 100 mL chemo infusion  5.4 mg/kg (Treatment Plan Recorded) Intravenous Once Ennever, Peter R, MD       fosaprepitant  (EMEND) 150 mg in sodium chloride  0.9 % 145 mL IVPB  150 mg Intravenous Once Ennever, Peter R, MD 450 mL/hr at 02/24/24 1101 150 mg at 02/24/24 1101   heparin  lock flush 100 unit/mL  500 Units Intracatheter Once PRN Timmy Maude SAUNDERS, MD       potassium chloride  SA (KLOR-CON  M) CR tablet 40 mEq  40 mEq Oral Once Ennever, Peter R, MD       sodium chloride  flush (NS) 0.9 % injection 10 mL  10 mL Intracatheter PRN Timmy Maude SAUNDERS, MD   10 mL at 01/13/24 1648   sodium chloride  flush (NS) 0.9 % injection 10 mL  10 mL Intracatheter PRN Timmy Maude SAUNDERS, MD        Physical Findings: The patient is in no acute distress. Patient is alert and oriented.  vitals were not taken for this  visit. .  No significant changes. Lungs are clear to auscultation bilaterally. Heart has regular rate and rhythm. No palpable cervical, supraclavicular, or axillary adenopathy. Abdomen soft, non-tender, normal bowel sounds.   Lab Findings: Lab Results  Component Value Date   WBC 6.6 02/24/2024   HGB 9.4 (L) 02/24/2024   HCT 29.2 (L) 02/24/2024   MCV 85.6 02/24/2024   PLT 274 02/24/2024    Radiographic Findings: US  CORE BIOPSY (SOFT TISSUE) Result Date: 02/02/2024 CLINICAL DATA:  Recurrent breast cancer in a patient with right neck adenopathy EXAM: MUSCLE/SOFT TISSUE CORE BIOPSY ANESTHESIA/SEDATION: 1% lidocaine  MEDICATIONS: None PROCEDURE: Ultrasound-guided core needle biopsy COMPLICATIONS: None immediate FINDINGS: With the patient in the supine position, ultrasound was used to evaluate the right neck in the region of interest. Multiple enlarged hypoechoic lymph nodes were identified in the region of known adenopathy. The patient's skin was marked, prepped, and draped in the usual sterile fashion. Soft tissue  was then infiltrated with 1% lidocaine  for local anesthesia. A needle was advanced from the skin to the nodule under ultrasound guidance. An 18 gauge biopsy needle was then advanced under ultrasound guidance to the proximal edge of the lymph node. A sample was then obtained, however the sample yielded scant material secondary to the mobility of the lymph node within this region of soft tissue. Two more times were made obtaining minimal material for diagnosis. Access was then exchanged for a 20 gauge biopsy needle which was advanced ease ear into the lymph node and a larger sample was obtained. All needles removed from the patient. Sterile dressing applied. IMPRESSION: Successful core needle biopsy of a right neck lymph node. Electronically Signed   By: Cordella Banner   On: 02/02/2024 16:03    Impression: Bony metastases from stage IV invasive lobular/ductal carcinoma of the left breast; s/p palliative radiation to T10, right ribs, sacrum, and left pelvis completed on 01/21/2024    The patient is recovering from the effects of radiation.  ***  Plan:  Patient will continue on systemic treatment under the care of Dr. Timmy. Radiation follow-up PRN. We appreicate the opportunity to again take part in this patient's care. She was encouraged to call with any questions or concerns.    *** minutes of total time was spent for this patient encounter, including preparation, face-to-face counseling with the patient and coordination of care, physical exam, and documentation of the encounter. ____________________________________   Leeroy Due, PA-C  This document serves as a record of services personally performed by Leeroy Due, PA-C. It was created on her behalf by Reymundo Cartwright, a trained medical scribe. The creation of this record is based on the scribe's personal observations and the provider's statements to them. This document has been checked and approved by the attending provider.

## 2024-02-25 ENCOUNTER — Telehealth: Payer: Self-pay | Admitting: Radiation Oncology

## 2024-02-25 ENCOUNTER — Ambulatory Visit
Admission: RE | Admit: 2024-02-25 | Discharge: 2024-02-25 | Disposition: A | Discharge status: Home / Self Care | Source: Ambulatory Visit | Attending: Radiology | Admitting: Radiology

## 2024-02-25 ENCOUNTER — Telehealth: Payer: Self-pay

## 2024-02-25 ENCOUNTER — Ambulatory Visit
Admission: RE | Admit: 2024-02-25 | Discharge: 2024-02-25 | Disposition: A | Discharge status: Home / Self Care | Source: Ambulatory Visit | Attending: Radiation Oncology | Admitting: Radiation Oncology

## 2024-02-25 DIAGNOSIS — C50912 Malignant neoplasm of unspecified site of left female breast: Secondary | ICD-10-CM

## 2024-02-25 LAB — CANCER ANTIGEN 27.29: CA 27.29: 16.6 U/mL (ref 0.0–38.6)

## 2024-02-25 NOTE — Progress Notes (Signed)
 Radiation Oncology         [phone]) [phone] ________________________________  Name: Briana [name] MRN: [id number]  Date: 02/25/2024  DOB: 1975/09/29  Follow-Up Visit Note - Conducted via telephone at patient request.   I spoke with the patient to conduct this consult visit via telephone. The patient was notified in advance and was offered an in person or telemedicine meeting to allow for face to face communication but instead preferred to proceed with a telephone follow-up.   CC: Almarie Waddell NOVAK, NP  Almarie Waddell NOVAK, NP    ICD-10-CM   1. Breast cancer metastasized to axillary lymph node, left (HCC)  C50.912    C77.3       Diagnosis: The primary encounter diagnosis was Metastasis to bone (HCC). A diagnosis of Stage IV breast cancer in female [facility]) was also pertinent to this visit.   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  -- Invasive ductal carcinoma : ER+ / PR+ / Her2 - / Grade 3.    Bony metastases from stage IV invasive lobular/ductal carcinoma of the left breast; s/p palliative radiation to T10, right ribs, sacrum, and left pelvis completed on 01/21/2024  Interval Since Last Radiation: 1 month and 5 day   Intent: Palliative  Radiation Treatment Dates:  First Treatment Date: 2024-01-07 -- Last Treatment Date: 2024-01-21 Site/Dose/Technique/Mode:  Plan Name: Spine_T10 Site: Thoracic Spine Technique: 3D Mode: Photon Dose Per Fraction: 3 Gy Prescribed Dose (Delivered / Prescribed): 30 Gy / 30 Gy Prescribed Fxs (Delivered / Prescribed): 10 / 10   Plan Name: Chest_R Site: Ribs, Right Technique: 3D Mode: Photon Dose Per Fraction: 3 Gy Prescribed Dose (Delivered / Prescribed): 30 Gy / 30 Gy Prescribed Fxs (Delivered / Prescribed): 10 /  10   Plan Name: Spine_Sacrum Site: Sacrum Technique: Isodose Plan Mode: Photon Dose Per Fraction: 3 Gy Prescribed Dose (Delivered / Prescribed): 30 Gy / 30 Gy Prescribed Fxs (Delivered / Prescribed): 10 / 10   Plan Name: Pelvis_L Site: Hip, Left Technique: Isodose Plan Mode: Photon Dose Per Fraction: 3 Gy Prescribed Dose (Delivered / Prescribed): 30 Gy / 30 Gy Prescribed Fxs (Delivered / Prescribed): 10 / 10   Narrative:  The patient returns today for routine follow-up. She tolerated radiation therapy relatively well other than fatigue throughout her treatment. She also experienced nausea and vomiting from her chemotherapy. Overall, she reports that her pain improved with radiation therapy.   To review, she was initially undergoing chemotherapy with Pembrolizumab , Paclitaxel , Carboplatin , and Filgrastim at the time of her re-consultation visit on 12/31/23. Given her significant disease progression Dr. Ennever discontinued this regimen and transitioned her to Enhertu  starting on 01/13/24 which has tolerated well thus far.   In most recent history, a right neck lymph node biopsy was collected on 02/02/24. Pathology showed metastatic carcinoma, consistent with metastatic breast carcinoma. Tumor was CD30 positive.   She received her most recent cycle of Enhertu  on 02/03/24. During her most recent follow up with  Dr. Timmy on 02/24/24, she complained of right lower lateral rib cage pain. He recommended X-ray of the rib cage for further evaluation and that she proceeds with her second cycle of Enhertu . X-ray of the ribs on 02/24/2024 demonstrated no signs of metastatic disease.    Patient  reports to be doing well overall today. She states that her right leg pain has improved since completing the radiation. She notes some stiffness when standing after sitting for long periods of time, but feels as thought this is also improving. She feels like her energy levels have returned to baseline.    Allergies:  is allergic to pumpkin flavoring agent (non-screening).  Meds: Current Outpatient Medications  Medication Sig Dispense Refill   amLODipine  (NORVASC ) 10 MG tablet TAKE 1 TABLET(10 MG) BY MOUTH DAILY 90 tablet 0   BIKTARVY  50-200-25 MG TABS tablet TAKE 1 TABLET DAILY 30 tablet 4   dexamethasone  (DECADRON ) 4 MG tablet Take 2 tablets (8 mg) by mouth daily for 3 days starting the day after chemotherapy. Take with food. 30 tablet 1   dronabinol  (MARINOL ) 10 MG capsule Dronabinol  10 mg by mouth daily 30 capsule 0   DUPIXENT 300 MG/2ML SOAJ  (Patient taking differently: Inject as directed 2 (two) times a week.)     hydrOXYzine (ATARAX) 25 MG tablet Take 1 tablet (25 mg total) by mouth every 8 (eight) hours as needed. 60 tablet 3   lidocaine -prilocaine  (EMLA ) cream Apply to affected area once 30 g 3   megestrol  (MEGACE  ES) 625 MG/5ML suspension Take 5 mLs (625 mg total) by mouth daily. 150 mL 3   methocarbamol  (ROBAXIN ) 500 MG tablet Take 1 tablet (500 mg total) by mouth every 8 (eight) hours as needed for muscle spasms. 20 tablet 0   ondansetron  (ZOFRAN ) 8 MG tablet Take 1 tablet (8 mg total) by mouth every 8 (eight) hours as needed for nausea or vomiting. Start on the third day after chemotherapy. 30 tablet 1   oxyCODONE -acetaminophen  (PERCOCET/ROXICET) 5-325 MG tablet Take 1 tablet by mouth every 8 (eight) hours as needed for severe pain (pain score 7-10). 90 tablet 0   polyethylene glycol (MIRALAX  / GLYCOLAX ) 17 g packet Take 17 g by mouth daily as needed for moderate constipation. 14 each 0   potassium chloride  SA (KLOR-CON  M) 20 MEQ tablet Take 1 tablet (20 mEq total) by mouth 2 (two) times daily.     prochlorperazine  (COMPAZINE ) 10 MG tablet Take 1 tablet (10 mg total) by mouth every 6 (six) hours as needed for nausea or vomiting. (Patient not taking: Reported on 02/24/2024) 30 tablet 1   senna-docusate (SENOKOT-S) 8.6-50 MG tablet Take 1 tablet by mouth 2 (two) times daily. (Patient  not taking: Reported on 02/24/2024) 30 tablet 0   triamcinolone  (KENALOG ) 0.025 % ointment Apply topically 2 (two) times daily as needed. 454 g 5   No current facility-administered medications for this encounter.   Facility-Administered Medications Ordered in Other Encounters  Medication Dose Route Frequency Provider Last Rate Last Admin   0.9 %  sodium chloride  infusion   Intravenous Continuous Franchot Lauraine HERO, NP   Stopped at 01/13/24 1502   dextrose  5 % solution   Intravenous Continuous Timmy Maude SAUNDERS, MD 400 mL/hr at 01/13/24 1652 Infusion Verify at 01/13/24 1652   sodium chloride  flush (NS) 0.9 % injection 10 mL  10 mL Intracatheter PRN Timmy Maude SAUNDERS, MD   10 mL at 01/13/24 1648    Physical Findings: The patient is in no acute distress. Limited, due to nature of telephone visit.    Lab Findings: Lab Results  Component Value Date   WBC 6.6 02/24/2024   HGB 9.4 (L) 02/24/2024   HCT 29.2 (L) 02/24/2024   MCV 85.6 02/24/2024   PLT 274 02/24/2024  Radiographic Findings: DG Ribs Unilateral W/Chest Right Result Date: 02/24/2024 CLINICAL DATA:  Right chest wall pain. EXAM: RIGHT RIBS AND CHEST - 3+ VIEW COMPARISON:  Chest radiograph dated 08/24/2021. FINDINGS: Right-sided Port-A-Cath with tip at the cavoatrial junction. No focal consolidation, pleural effusion, pneumothorax. The cardiac silhouette is within normal limits. No acute osseous pathology. No displaced rib fractures. IMPRESSION: 1. No acute cardiopulmonary process. 2. No displaced rib fractures. Electronically Signed   By: Vanetta Chou M.D.   On: 02/24/2024 11:32   US  CORE BIOPSY (SOFT TISSUE) Result Date: 02/02/2024 CLINICAL DATA:  Recurrent breast cancer in a patient with right neck adenopathy EXAM: MUSCLE/SOFT TISSUE CORE BIOPSY ANESTHESIA/SEDATION: 1% lidocaine  MEDICATIONS: None PROCEDURE: Ultrasound-guided core needle biopsy COMPLICATIONS: None immediate FINDINGS: With the patient in the supine position, ultrasound  was used to evaluate the right neck in the region of interest. Multiple enlarged hypoechoic lymph nodes were identified in the region of known adenopathy. The patient's skin was marked, prepped, and draped in the usual sterile fashion. Soft tissue was then infiltrated with 1% lidocaine  for local anesthesia. A needle was advanced from the skin to the nodule under ultrasound guidance. An 18 gauge biopsy needle was then advanced under ultrasound guidance to the proximal edge of the lymph node. A sample was then obtained, however the sample yielded scant material secondary to the mobility of the lymph node within this region of soft tissue. Two more times were made obtaining minimal material for diagnosis. Access was then exchanged for a 20 gauge biopsy needle which was advanced ease ear into the lymph node and a larger sample was obtained. All needles removed from the patient. Sterile dressing applied. IMPRESSION: Successful core needle biopsy of a right neck lymph node. Electronically Signed   By: Cordella Banner   On: 02/02/2024 16:03    Impression/Plan: Bony metastases from stage IV invasive lobular/ductal carcinoma of the left breast; s/p palliative radiation to T10, right ribs, sacrum, and left pelvis completed on 01/21/2024    The patient tolerated her treatment well and fortunately experienced pain relief from radiation. Patient will continue on systemic treatment under the care of Dr. Timmy. Radiation follow-up PRN. We appreicate the opportunity to again take part in this patient's care. She was encouraged to call with any questions or concerns.    This encounter was conducted via telephone.  The patient has provided two factor identification and has given verbal consent for this type of encounter and has been advised to only accept a meeting of this type in a secure network environment.  The time spent during this encounter was 20 minutes including preparation, discussion, and coordination of the  patient's care  The attendants for this meeting include Leeroy Due PA-C and patient During the encounter Leeroy Due PA-C was located at [facility] Radiation Oncology Department.  Patient was located at home. ____________________________________   Leeroy Due, PA-C  This document serves as a record of services personally performed by Leeroy Due, PA-C. It was created on her behalf by Reymundo Cartwright, a trained medical scribe. The creation of this record is based on the scribe's personal observations and the provider's statements to them. This document has been checked and approved by the attending provider.

## 2024-02-25 NOTE — Telephone Encounter (Signed)
 Briana [name] called back and agreed to a telephone follow up visit today with Leeroy Due PA-C and will keep her in person visit on 7/3.

## 2024-02-25 NOTE — Telephone Encounter (Signed)
 Briana [name] was a no show for her one month follow up today with Dr. Shannon. She was rescheduled for 03/03/24. I left a voicemail giving her the option to do a telephone visit versus a in- person visit.

## 2024-02-25 NOTE — Telephone Encounter (Signed)
 LVM advising pt 6/26 missed appt has been r/s to 7/3 @ 9:45am.

## 2024-02-25 NOTE — Progress Notes (Signed)
   Bony metastases from stage IV invasive lobular/ductal carcinoma of the left breast; s/p palliative radiation to T10, right ribs, sacrum, and left pelvis completed on 01/21/2024   I contacted Briana [name] by phone today for follow up post radiation to the breast. She was identified with two identifiers.      Does the patient complain of any of the following:  Post radiation skin issues: Denies Breast Tenderness: Denies Breast Swelling: Denies Lymphadema: Denies Range of Motion limitations: Denies Fatigue post radiation: Denies Appetite good/fair/poor: Fair       Range of Motion limitations: Denies       Energy: Low       Nausea: Denies       Diarrhea: Denies       Skin irritations: Denies       Swallowing/Choking concerns Denies  Additional comments if applicable: She stated that today was a good day. Yesterday her complaint was pain and stiffness to her right leg.    No vitals were taken at this visit.

## 2024-02-29 ENCOUNTER — Encounter: Payer: Self-pay | Admitting: Hematology & Oncology

## 2024-02-29 ENCOUNTER — Encounter: Payer: Self-pay | Admitting: Family

## 2024-03-03 ENCOUNTER — Ambulatory Visit: Admitting: Radiation Oncology

## 2024-03-16 ENCOUNTER — Inpatient Hospital Stay

## 2024-03-16 ENCOUNTER — Inpatient Hospital Stay: Attending: Hematology & Oncology

## 2024-03-16 ENCOUNTER — Other Ambulatory Visit: Payer: Self-pay

## 2024-03-16 ENCOUNTER — Inpatient Hospital Stay (HOSPITAL_BASED_OUTPATIENT_CLINIC_OR_DEPARTMENT_OTHER): Admitting: Medical Oncology

## 2024-03-16 VITALS — BP 110/87 | HR 91

## 2024-03-16 VITALS — BP 116/91 | HR 103 | Temp 98.3°F | Resp 18 | Ht 65.0 in | Wt 136.4 lb

## 2024-03-16 DIAGNOSIS — D508 Other iron deficiency anemias: Secondary | ICD-10-CM

## 2024-03-16 DIAGNOSIS — Z21 Asymptomatic human immunodeficiency virus [HIV] infection status: Secondary | ICD-10-CM | POA: Diagnosis present

## 2024-03-16 DIAGNOSIS — C773 Secondary and unspecified malignant neoplasm of axilla and upper limb lymph nodes: Secondary | ICD-10-CM | POA: Diagnosis not present

## 2024-03-16 DIAGNOSIS — C50919 Malignant neoplasm of unspecified site of unspecified female breast: Secondary | ICD-10-CM

## 2024-03-16 DIAGNOSIS — Z95828 Presence of other vascular implants and grafts: Secondary | ICD-10-CM

## 2024-03-16 DIAGNOSIS — Z79899 Other long term (current) drug therapy: Secondary | ICD-10-CM | POA: Diagnosis not present

## 2024-03-16 DIAGNOSIS — Z171 Estrogen receptor negative status [ER-]: Secondary | ICD-10-CM | POA: Diagnosis not present

## 2024-03-16 DIAGNOSIS — C50912 Malignant neoplasm of unspecified site of left female breast: Secondary | ICD-10-CM

## 2024-03-16 DIAGNOSIS — C7951 Secondary malignant neoplasm of bone: Secondary | ICD-10-CM | POA: Insufficient documentation

## 2024-03-16 DIAGNOSIS — B20 Human immunodeficiency virus [HIV] disease: Secondary | ICD-10-CM | POA: Diagnosis not present

## 2024-03-16 DIAGNOSIS — D509 Iron deficiency anemia, unspecified: Secondary | ICD-10-CM | POA: Insufficient documentation

## 2024-03-16 LAB — CBC WITH DIFFERENTIAL (CANCER CENTER ONLY)
Abs Immature Granulocytes: 0.03 K/uL (ref 0.00–0.07)
Basophils Absolute: 0 K/uL (ref 0.0–0.1)
Basophils Relative: 1 %
Eosinophils Absolute: 1 K/uL — ABNORMAL HIGH (ref 0.0–0.5)
Eosinophils Relative: 21 %
HCT: 32.4 % — ABNORMAL LOW (ref 36.0–46.0)
Hemoglobin: 10.2 g/dL — ABNORMAL LOW (ref 12.0–15.0)
Immature Granulocytes: 1 %
Lymphocytes Relative: 15 %
Lymphs Abs: 0.7 K/uL (ref 0.7–4.0)
MCH: 28.6 pg (ref 26.0–34.0)
MCHC: 31.5 g/dL (ref 30.0–36.0)
MCV: 90.8 fL (ref 80.0–100.0)
Monocytes Absolute: 0.5 K/uL (ref 0.1–1.0)
Monocytes Relative: 10 %
Neutro Abs: 2.6 K/uL (ref 1.7–7.7)
Neutrophils Relative %: 52 %
Platelet Count: 264 K/uL (ref 150–400)
RBC: 3.57 MIL/uL — ABNORMAL LOW (ref 3.87–5.11)
RDW: 18.6 % — ABNORMAL HIGH (ref 11.5–15.5)
WBC Count: 4.8 K/uL (ref 4.0–10.5)
nRBC: 0 % (ref 0.0–0.2)

## 2024-03-16 LAB — CMP (CANCER CENTER ONLY)
ALT: 10 U/L (ref 0–44)
AST: 15 U/L (ref 15–41)
Albumin: 3.2 g/dL — ABNORMAL LOW (ref 3.5–5.0)
Alkaline Phosphatase: 137 U/L — ABNORMAL HIGH (ref 38–126)
Anion gap: 10 (ref 5–15)
BUN: 6 mg/dL (ref 6–20)
CO2: 21 mmol/L — ABNORMAL LOW (ref 22–32)
Calcium: 8.8 mg/dL — ABNORMAL LOW (ref 8.9–10.3)
Chloride: 111 mmol/L (ref 98–111)
Creatinine: 0.77 mg/dL (ref 0.44–1.00)
GFR, Estimated: >60 mL/min (ref >60)
Glucose, Bld: 150 mg/dL — ABNORMAL HIGH (ref 70–99)
Potassium: 3.2 mmol/L — ABNORMAL LOW (ref 3.5–5.1)
Sodium: 142 mmol/L (ref 135–145)
Total Bilirubin: 0.3 mg/dL (ref 0.0–1.2)
Total Protein: 6.1 g/dL — ABNORMAL LOW (ref 6.5–8.1)

## 2024-03-16 MED ORDER — HYDROXYZINE HCL 25 MG PO TABS: 25.0000 mg | ORAL_TABLET | Freq: Three times a day (TID) | ORAL | 3 refills | Status: AC | PRN

## 2024-03-16 MED ORDER — DIPHENHYDRAMINE HCL 25 MG PO CAPS
50.0000 mg | ORAL_CAPSULE | Freq: Once | ORAL | Status: AC
  Administered 2024-03-16: 25 mg via ORAL
  Filled 2024-03-16: qty 2

## 2024-03-16 MED ORDER — OXYCODONE-ACETAMINOPHEN 5-325 MG PO TABS: 1.0000 | ORAL_TABLET | Freq: Three times a day (TID) | ORAL | 0 refills | Status: DC | PRN

## 2024-03-16 MED ORDER — DEXTROSE 5 % IV SOLN: INTRAVENOUS | Status: DC

## 2024-03-16 MED ORDER — SODIUM CHLORIDE 0.9 % IV SOLN
150.0000 mg | Freq: Once | INTRAVENOUS | Status: AC
  Administered 2024-03-16: 150 mg via INTRAVENOUS
  Filled 2024-03-16: qty 150

## 2024-03-16 MED ORDER — DEXAMETHASONE SODIUM PHOSPHATE 10 MG/ML IJ SOLN
10.0000 mg | Freq: Once | INTRAMUSCULAR | Status: AC
  Administered 2024-03-16: 10 mg via INTRAVENOUS
  Filled 2024-03-16: qty 1

## 2024-03-16 MED ORDER — HEPARIN SOD (PORK) LOCK FLUSH 100 UNIT/ML IV SOLN
500.0000 [IU] | Freq: Once | INTRAVENOUS | Status: AC
  Administered 2024-03-16: 500 [IU] via INTRAVENOUS

## 2024-03-16 MED ORDER — SODIUM CHLORIDE 0.9% FLUSH
10.0000 mL | Freq: Once | INTRAVENOUS | Status: AC
  Administered 2024-03-16: 10 mL via INTRAVENOUS

## 2024-03-16 MED ORDER — ZOLEDRONIC ACID 4 MG/100ML IV SOLN
4.0000 mg | Freq: Once | INTRAVENOUS | Status: AC
  Administered 2024-03-16: 4 mg via INTRAVENOUS
  Filled 2024-03-16: qty 100

## 2024-03-16 MED ORDER — PALONOSETRON HCL INJECTION 0.25 MG/5ML
0.2500 mg | Freq: Once | INTRAVENOUS | Status: AC
  Administered 2024-03-16: 0.25 mg via INTRAVENOUS
  Filled 2024-03-16: qty 5

## 2024-03-16 MED ORDER — FAM-TRASTUZUMAB DERUXTECAN-NXKI CHEMO 100 MG IV SOLR
5.4000 mg/kg | Freq: Once | INTRAVENOUS | Status: AC
  Administered 2024-03-16: 300 mg via INTRAVENOUS
  Filled 2024-03-16: qty 15

## 2024-03-16 MED ORDER — SODIUM CHLORIDE 0.9 % IV SOLN: Freq: Once | INTRAVENOUS | Status: AC

## 2024-03-16 MED ORDER — ACETAMINOPHEN 325 MG PO TABS
650.0000 mg | ORAL_TABLET | Freq: Once | ORAL | Status: AC
  Administered 2024-03-16: 650 mg via ORAL
  Filled 2024-03-16: qty 2

## 2024-03-16 NOTE — Progress Notes (Signed)
 Is here Hematology and Oncology Follow Up Visit  Briana [name] [id number] 24-Mar-1976 48 y.o. 03/16/2024   Principle Diagnosis:  Stage IIIC (T4N3M0) - TRIPLE NEGATIVE - infiltrating ductal carcinoma of the left breast -metastatic to bone -- HER2 2+ (IHC) HIV (+) Iron  deficiency anemia  Current Therapy:   Neoadjuvant  chemoimmunotherapy --- carboplatin /Taxol /pembrolizumab  ---s/p cycle #3 - start on 02/19/2023 S/p LEFT MRM - 06/08/2023 --  Residual breast cancer - 3/3 nodes (-) XRT -- Start on 07/22/2023 --completed on 09/15/2023 Xeloda  - s/p cycle #3 on 09/28/2023 Zometa  4 mg IV every 3 months-next dose to be given on 03/2024 Enhertu  5.4 mg/m IV every 3 weeks-s/p cycle #2  -- start on 01/13/2024 Radiotherapy to multiple sites-completed on 01/21/2024 IV iron -Venofer  given on 01/13/2024     Interim History:  Briana [name] is back for follow-up.  At her last visit it was discovered that she was not taking her decadron  as prescribed and was having a lot of nausea/vomiting. She has now adjusted how she is taking this and is feeling much better. She is not having any nausea of vomiting now.   She is having some pain over in the right lower lateral rib cage.  This does not radiate. Plain film on 02/24/2024 did not show cause for pain.  It is not associated with shortness of breath.  There is no cough or hemoptysis.  Lymph node biopsy was done on 02/02/2024.  The pathology report [phone]) showed metastatic carcinoma consistent with her breast cancer.  Surprisingly, it was CD30 positive. Oxycodone  has been helpful for her pain.   She was also put on Atarax  for itching thought to be secondary to her eczema. She has not started this yet but will pick it up today. We discussed that should try Claritin  if it makes her too groggy.   When asked about her potassium supplement she states that she is not taking this on a regular basis.   Her last tumor marker-CA 27.29-on 02/03/2024 was only  10.2.  Currently, I would have to say that her performance status is probably ECOG 2.   Wt Readings from Last 3 Encounters:  03/16/24 136 lb 6.4 oz (61.9 kg)  02/24/24 125 lb (56.7 kg)  02/03/24 135 lb (61.2 kg)    Medications:  Current Outpatient Medications:    amLODipine  (NORVASC ) 10 MG tablet, TAKE 1 TABLET(10 MG) BY MOUTH DAILY, Disp: 90 tablet, Rfl: 0   BIKTARVY  50-200-25 MG TABS tablet, TAKE 1 TABLET DAILY, Disp: 30 tablet, Rfl: 4   dexamethasone  (DECADRON ) 4 MG tablet, Take 2 tablets (8 mg) by mouth daily for 3 days starting the day after chemotherapy. Take with food., Disp: 30 tablet, Rfl: 1   dronabinol  (MARINOL ) 10 MG capsule, Dronabinol  10 mg by mouth daily, Disp: 30 capsule, Rfl: 0   DUPIXENT 300 MG/2ML SOAJ, , Disp: , Rfl:    hydrOXYzine  (ATARAX ) 25 MG tablet, Take 1 tablet (25 mg total) by mouth every 8 (eight) hours as needed., Disp: 60 tablet, Rfl: 3   lidocaine -prilocaine  (EMLA ) cream, Apply to affected area once, Disp: 30 g, Rfl: 3   megestrol  (MEGACE  ES) 625 MG/5ML suspension, Take 5 mLs (625 mg total) by mouth daily., Disp: 150 mL, Rfl: 3   methocarbamol  (ROBAXIN ) 500 MG tablet, Take 1 tablet (500 mg total) by mouth every 8 (eight) hours as needed for muscle spasms., Disp: 20 tablet, Rfl: 0   ondansetron  (ZOFRAN ) 8 MG tablet, Take 1 tablet (8 mg total) by mouth every  8 (eight) hours as needed for nausea or vomiting. Start on the third day after chemotherapy., Disp: 30 tablet, Rfl: 1   oxyCODONE -acetaminophen  (PERCOCET/ROXICET) 5-325 MG tablet, Take 1 tablet by mouth every 8 (eight) hours as needed for severe pain (pain score 7-10)., Disp: 90 tablet, Rfl: 0   polyethylene glycol (MIRALAX  / GLYCOLAX ) 17 g packet, Take 17 g by mouth daily as needed for moderate constipation., Disp: 14 each, Rfl: 0   potassium chloride  SA (KLOR-CON  M) 20 MEQ tablet, Take 1 tablet (20 mEq total) by mouth 2 (two) times daily., Disp: , Rfl:    prochlorperazine  (COMPAZINE ) 10 MG tablet, Take 1  tablet (10 mg total) by mouth every 6 (six) hours as needed for nausea or vomiting., Disp: 30 tablet, Rfl: 1   senna-docusate (SENOKOT-S) 8.6-50 MG tablet, Take 1 tablet by mouth 2 (two) times daily., Disp: 30 tablet, Rfl: 0   triamcinolone  (KENALOG ) 0.025 % ointment, Apply topically 2 (two) times daily as needed., Disp: 454 g, Rfl: 5 No current facility-administered medications for this visit.  Facility-Administered Medications Ordered in Other Visits:    0.9 %  sodium chloride  infusion, , Intravenous, Continuous, Franchot Lauraine HERO, NP, Stopped at 01/13/24 1502   dextrose  5 % solution, , Intravenous, Continuous, Ennever, Maude SAUNDERS, MD, Last Rate: 400 mL/hr at 01/13/24 1652, Infusion Verify at 01/13/24 1652   sodium chloride  flush (NS) 0.9 % injection 10 mL, 10 mL, Intracatheter, PRN, Ennever, Peter R, MD, 10 mL at 01/13/24 1648  Allergies:  Allergies  Allergen Reactions   Pumpkin Flavoring Agent (Non-Screening) Swelling    ONLY Lip swelling after eating pumpkin pie    Past Medical History, Surgical history, Social history, and Family History were reviewed and updated.  Review of Systems: Review of Systems  Constitutional: Negative.   HENT:  Negative.    Eyes: Negative.   Respiratory: Negative.    Cardiovascular: Negative.   Gastrointestinal: Negative.   Endocrine: Negative.   Genitourinary: Negative.    Musculoskeletal: Negative.   Skin: Negative.   Neurological: Negative.   Hematological: Negative.   Psychiatric/Behavioral: Negative.      Physical Exam: Vitals:   03/16/24 1023  BP: (!) 116/91  Pulse: (!) 103  Resp: 18  Temp: 98.3 F (36.8 C)  SpO2: 100%     Wt Readings from Last 3 Encounters:  03/16/24 136 lb 6.4 oz (61.9 kg)  02/24/24 125 lb (56.7 kg)  02/03/24 135 lb (61.2 kg)    Physical Exam Vitals reviewed.  HENT:     Head: Normocephalic and atraumatic.  Eyes:     Pupils: Pupils are equal, round, and reactive to light.  Cardiovascular:     Rate and  Rhythm: Normal rate and regular rhythm.     Heart sounds: Normal heart sounds.  Pulmonary:     Effort: Pulmonary effort is normal.     Breath sounds: Normal breath sounds.  Abdominal:     General: Bowel sounds are normal.     Palpations: Abdomen is soft.  Musculoskeletal:        General: No tenderness or deformity. Normal range of motion.     Cervical back: Normal range of motion.  Lymphadenopathy:     Cervical: No cervical adenopathy.  Skin:    General: Skin is warm and dry.     Findings: No erythema or rash.  Neurological:     Mental Status: She is alert and oriented to person, place, and time.  Psychiatric:  Behavior: Behavior normal.        Thought Content: Thought content normal.        Judgment: Judgment normal.      Lab Results  Component Value Date   WBC 4.8 03/16/2024   HGB 10.2 (L) 03/16/2024   HCT 32.4 (L) 03/16/2024   MCV 90.8 03/16/2024   PLT 264 03/16/2024     Chemistry      Component Value Date/Time   NA 142 03/16/2024 0950   K 3.2 (L) 03/16/2024 0950   CL 111 03/16/2024 0950   CO2 21 (L) 03/16/2024 0950   BUN 6 03/16/2024 0950   CREATININE 0.77 03/16/2024 0950   CREATININE 0.62 10/17/2022 0947      Component Value Date/Time   CALCIUM 8.8 (L) 03/16/2024 0950   ALKPHOS 137 (H) 03/16/2024 0950   AST 15 03/16/2024 0950   ALT 10 03/16/2024 0950   BILITOT 0.3 03/16/2024 0950     Encounter Diagnoses  Name Primary?   Breast cancer metastasized to axillary lymph node, left (HCC) Yes   Stage IV breast cancer in female [facility])    Other iron  deficiency anemia    Human immunodeficiency virus (HIV) disease (HCC)     Impression and Plan: Ms. Tomson is a very nice 48 year old premenopausal African American female with TRIPLE NEGATIVE breast cancer.  She has aggressive disease in my opinion.  She has already recurred.  She has recurred in her bones and in her lymph nodes.  We did get the lymph node biopsy.  Surprisingly was CD30 positive.  Foundation  One analysis pending.   In agreement with Dr. Timmy, her tumor does not emit much CA 27.29 marker and we do not feel confident that we can use this as a marker for response.  Very glad that she is feeling better in terms of her nausea and vomiting She will try to eat 1-2 bananas per day to help with her potassium or take her potassium supplement   CBC and CMP reviewed and discussed with patient.  We will proceed with the third cycle of Enhertu  and Zometa  RTC 3 weeks MD, port labs(CBC w/, CMP, CA 27.29), Enhertu     Lauraine CHRISTELLA Dais, PA-C 7/16/202510:44 AM

## 2024-03-16 NOTE — Patient Instructions (Signed)

## 2024-03-17 ENCOUNTER — Ambulatory Visit: Payer: Self-pay | Admitting: Hematology & Oncology

## 2024-03-17 LAB — CANCER ANTIGEN 27.29: CA 27.29: 13.4 U/mL (ref 0.0–38.6)

## 2024-04-06 ENCOUNTER — Inpatient Hospital Stay: Attending: Hematology & Oncology

## 2024-04-06 ENCOUNTER — Inpatient Hospital Stay

## 2024-04-06 ENCOUNTER — Inpatient Hospital Stay (HOSPITAL_BASED_OUTPATIENT_CLINIC_OR_DEPARTMENT_OTHER): Admitting: Hematology & Oncology

## 2024-04-06 ENCOUNTER — Encounter: Payer: Self-pay | Admitting: Hematology & Oncology

## 2024-04-06 VITALS — BP 140/85 | HR 101 | Temp 98.4°F | Resp 18 | Ht 65.0 in | Wt 141.1 lb

## 2024-04-06 VITALS — BP 129/69 | HR 93

## 2024-04-06 DIAGNOSIS — C7951 Secondary malignant neoplasm of bone: Secondary | ICD-10-CM | POA: Diagnosis not present

## 2024-04-06 DIAGNOSIS — C50919 Malignant neoplasm of unspecified site of unspecified female breast: Secondary | ICD-10-CM

## 2024-04-06 DIAGNOSIS — C773 Secondary and unspecified malignant neoplasm of axilla and upper limb lymph nodes: Secondary | ICD-10-CM | POA: Diagnosis not present

## 2024-04-06 DIAGNOSIS — D509 Iron deficiency anemia, unspecified: Secondary | ICD-10-CM | POA: Insufficient documentation

## 2024-04-06 DIAGNOSIS — C50912 Malignant neoplasm of unspecified site of left female breast: Secondary | ICD-10-CM | POA: Insufficient documentation

## 2024-04-06 DIAGNOSIS — Z21 Asymptomatic human immunodeficiency virus [HIV] infection status: Secondary | ICD-10-CM | POA: Diagnosis present

## 2024-04-06 DIAGNOSIS — Z79899 Other long term (current) drug therapy: Secondary | ICD-10-CM | POA: Diagnosis not present

## 2024-04-06 LAB — CBC WITH DIFFERENTIAL (CANCER CENTER ONLY)
Abs Immature Granulocytes: 0.03 K/uL (ref 0.00–0.07)
Basophils Absolute: 0 K/uL (ref 0.0–0.1)
Basophils Relative: 0 %
Eosinophils Absolute: 0.6 K/uL — ABNORMAL HIGH (ref 0.0–0.5)
Eosinophils Relative: 11 %
HCT: 34.4 % — ABNORMAL LOW (ref 36.0–46.0)
Hemoglobin: 11.2 g/dL — ABNORMAL LOW (ref 12.0–15.0)
Immature Granulocytes: 1 %
Lymphocytes Relative: 13 %
Lymphs Abs: 0.7 K/uL (ref 0.7–4.0)
MCH: 29.7 pg (ref 26.0–34.0)
MCHC: 32.6 g/dL (ref 30.0–36.0)
MCV: 91.2 fL (ref 80.0–100.0)
Monocytes Absolute: 0.6 K/uL (ref 0.1–1.0)
Monocytes Relative: 11 %
Neutro Abs: 3.6 K/uL (ref 1.7–7.7)
Neutrophils Relative %: 64 %
Platelet Count: 251 K/uL (ref 150–400)
RBC: 3.77 MIL/uL — ABNORMAL LOW (ref 3.87–5.11)
RDW: 17.2 % — ABNORMAL HIGH (ref 11.5–15.5)
WBC Count: 5.6 K/uL (ref 4.0–10.5)
nRBC: 0 % (ref 0.0–0.2)

## 2024-04-06 LAB — CMP (CANCER CENTER ONLY)
ALT: 13 U/L (ref 0–44)
AST: 22 U/L (ref 15–41)
Albumin: 3.8 g/dL (ref 3.5–5.0)
Alkaline Phosphatase: 156 U/L — ABNORMAL HIGH (ref 38–126)
Anion gap: 12 (ref 5–15)
BUN: 7 mg/dL (ref 6–20)
CO2: 21 mmol/L — ABNORMAL LOW (ref 22–32)
Calcium: 9.1 mg/dL (ref 8.9–10.3)
Chloride: 106 mmol/L (ref 98–111)
Creatinine: 0.59 mg/dL (ref 0.44–1.00)
GFR, Estimated: >60 mL/min (ref >60)
Glucose, Bld: 102 mg/dL — ABNORMAL HIGH (ref 70–99)
Potassium: 3.6 mmol/L (ref 3.5–5.1)
Sodium: 139 mmol/L (ref 135–145)
Total Bilirubin: <0.2 mg/dL (ref 0.0–1.2)
Total Protein: 7 g/dL (ref 6.5–8.1)

## 2024-04-06 MED ORDER — DEXAMETHASONE SODIUM PHOSPHATE 10 MG/ML IJ SOLN
10.0000 mg | Freq: Once | INTRAMUSCULAR | Status: AC
  Administered 2024-04-06: 10 mg via INTRAVENOUS
  Filled 2024-04-06: qty 1

## 2024-04-06 MED ORDER — DIPHENHYDRAMINE HCL 25 MG PO CAPS
50.0000 mg | ORAL_CAPSULE | Freq: Once | ORAL | Status: AC
  Administered 2024-04-06: 25 mg via ORAL
  Filled 2024-04-06: qty 2

## 2024-04-06 MED ORDER — DEXTROSE 5 % IV SOLN: INTRAVENOUS | Status: DC

## 2024-04-06 MED ORDER — ACETAMINOPHEN 325 MG PO TABS
650.0000 mg | ORAL_TABLET | Freq: Once | ORAL | Status: AC
  Administered 2024-04-06: 650 mg via ORAL
  Filled 2024-04-06: qty 2

## 2024-04-06 MED ORDER — SODIUM CHLORIDE 0.9% FLUSH: 10.0000 mL | INTRAVENOUS | Status: DC | PRN

## 2024-04-06 MED ORDER — FAM-TRASTUZUMAB DERUXTECAN-NXKI CHEMO 100 MG IV SOLR
5.4000 mg/kg | Freq: Once | INTRAVENOUS | Status: AC
  Administered 2024-04-06: 300 mg via INTRAVENOUS
  Filled 2024-04-06: qty 15

## 2024-04-06 MED ORDER — SODIUM CHLORIDE 0.9 % IV SOLN
150.0000 mg | Freq: Once | INTRAVENOUS | Status: AC
  Administered 2024-04-06: 150 mg via INTRAVENOUS
  Filled 2024-04-06: qty 150

## 2024-04-06 MED ORDER — PALONOSETRON HCL INJECTION 0.25 MG/5ML
0.2500 mg | Freq: Once | INTRAVENOUS | Status: AC
  Administered 2024-04-06: 0.25 mg via INTRAVENOUS
  Filled 2024-04-06: qty 5

## 2024-04-06 NOTE — Progress Notes (Signed)
 Patient with weight gain. Continue Enhertu  300 mg dose per Dr. Timmy.

## 2024-04-06 NOTE — Progress Notes (Signed)
 Is here Hematology and Oncology Follow Up Visit  Briana [name] [id number] Nov 27, 1975 47 y.o. 04/06/2024   Principle Diagnosis:  Stage IIIC (T4N3M0) - TRIPLE NEGATIVE - infiltrating ductal carcinoma of the left breast -metastatic to bone -- HER2 2+ ([facility]) HIV (+) Iron  deficiency anemia  Current Therapy:   Neoadjuvant  chemoimmunotherapy --- carboplatin /Taxol /pembrolizumab  ---s/p cycle #3 - start on 02/19/2023 S/p LEFT MRM - 06/08/2023 --  Residual breast cancer - 3/3 nodes (-) XRT -- Start on 07/22/2023 --completed on 09/15/2023 Xeloda  - s/p cycle #3 on 09/28/2023 Zometa  4 mg IV every 3 months-next dose to be given on 06/2024 Enhertu  5.4 mg/m IV every 3 weeks-s/p cycle #4  -- start on 01/13/2024 Radiotherapy to multiple sites-completed on 01/21/2024 IV iron -Venofer  given on 01/13/2024     Interim History:  Briana [name] is back for follow-up.  She is doing okay.  She is still working over at [employer].  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  I know this is going to be crazy.  Hopefully, should be able to pace herself.  So far, she is tolerated treatment quite well.  There is been no cough or shortness of breath.  Her appetite is doing better.  She has had no nausea or vomiting.  She has had no diarrhea.  She still has a little bit of pain in the right leg.  Her last CA 27.29 was down to 13.4.  She has had no headache.  There has been no visual changes.  She has had no rashes.  There has been no bleeding.  Thankfully, the HIV has never been a problem for her.  Currently, her performance status is probably ECOG 1.     Wt Readings from Last 3 Encounters:  04/06/24 141 lb 1.6 oz (64 kg)  03/16/24 136 lb 6.4 oz (61.9 kg)  02/24/24 125 lb (56.7 kg)    Medications:  Current Outpatient Medications:    amLODipine  (NORVASC ) 10 MG tablet, TAKE 1 TABLET(10 MG) BY MOUTH DAILY, Disp: 90 tablet, Rfl: 0   BIKTARVY  50-200-25 MG TABS tablet, TAKE 1 TABLET  DAILY, Disp: 30 tablet, Rfl: 4   dexamethasone  (DECADRON ) 4 MG tablet, Take 2 tablets (8 mg) by mouth daily for 3 days starting the day after chemotherapy. Take with food., Disp: 30 tablet, Rfl: 1   dronabinol  (MARINOL ) 10 MG capsule, Dronabinol  10 mg by mouth daily, Disp: 30 capsule, Rfl: 0   hydrOXYzine  (ATARAX ) 25 MG tablet, Take 1 tablet (25 mg total) by mouth every 8 (eight) hours as needed., Disp: 60 tablet, Rfl: 3   methocarbamol  (ROBAXIN ) 500 MG tablet, Take 1 tablet (500 mg total) by mouth every 8 (eight) hours as needed for muscle spasms., Disp: 20 tablet, Rfl: 0   ondansetron  (ZOFRAN ) 8 MG tablet, Take 1 tablet (8 mg total) by mouth every 8 (eight) hours as needed for nausea or vomiting. Start on the third day after chemotherapy., Disp: 30 tablet, Rfl: 1   oxyCODONE -acetaminophen  (PERCOCET/ROXICET) 5-325 MG tablet, Take 1 tablet by mouth every 8 (eight) hours as needed for severe pain (pain score 7-10)., Disp: 90 tablet, Rfl: 0   polyethylene glycol (MIRALAX  / GLYCOLAX ) 17 g packet, Take 17 g by mouth daily as needed for moderate constipation., Disp: 14 each, Rfl: 0   potassium chloride  SA (KLOR-CON  M) 20 MEQ tablet, Take 1 tablet (20 mEq total) by mouth 2 (two) times daily. (Patient taking differently: Take 20 mEq by mouth daily.), Disp: ,  Rfl:    prochlorperazine  (COMPAZINE ) 10 MG tablet, Take 1 tablet (10 mg total) by mouth every 6 (six) hours as needed for nausea or vomiting., Disp: 30 tablet, Rfl: 1   triamcinolone  (KENALOG ) 0.025 % ointment, Apply topically 2 (two) times daily as needed., Disp: 454 g, Rfl: 5   lidocaine -prilocaine  (EMLA ) cream, Apply to affected area once (Patient not taking: Reported on 04/06/2024), Disp: 30 g, Rfl: 3   megestrol  (MEGACE  ES) 625 MG/5ML suspension, Take 5 mLs (625 mg total) by mouth daily. (Patient not taking: Reported on 04/06/2024), Disp: 150 mL, Rfl: 3   senna-docusate (SENOKOT-S) 8.6-50 MG tablet, Take 1 tablet by mouth 2 (two) times daily. (Patient not  taking: Reported on 04/06/2024), Disp: 30 tablet, Rfl: 0 No current facility-administered medications for this visit.  Facility-Administered Medications Ordered in Other Visits:    0.9 %  sodium chloride  infusion, , Intravenous, Continuous, Franchot Lauraine HERO, NP, Stopped at 01/13/24 1502   dextrose  5 % solution, , Intravenous, Continuous, Asanti Craigo, Maude SAUNDERS, MD, Last Rate: 400 mL/hr at 01/13/24 1652, Infusion Verify at 01/13/24 1652   sodium chloride  flush (NS) 0.9 % injection 10 mL, 10 mL, Intracatheter, PRN, Lillard Bailon R, MD, 10 mL at 01/13/24 1648  Allergies:  Allergies  Allergen Reactions   Pumpkin Flavoring Agent (Non-Screening) Swelling    ONLY Lip swelling after eating pumpkin pie    Past Medical History, Surgical history, Social history, and Family History were reviewed and updated.  Review of Systems: Review of Systems  Constitutional: Negative.   HENT:  Negative.    Eyes: Negative.   Respiratory: Negative.    Cardiovascular: Negative.   Gastrointestinal: Negative.   Endocrine: Negative.   Genitourinary: Negative.    Musculoskeletal: Negative.   Skin: Negative.   Neurological: Negative.   Hematological: Negative.   Psychiatric/Behavioral: Negative.      Physical Exam: Vitals:   04/06/24 1107  BP: (!) 140/85  Pulse: (!) 101  Resp: 18  Temp: 98.4 F (36.9 C)     Wt Readings from Last 3 Encounters:  04/06/24 141 lb 1.6 oz (64 kg)  03/16/24 136 lb 6.4 oz (61.9 kg)  02/24/24 125 lb (56.7 kg)    Physical Exam Vitals reviewed.  HENT:     Head: Normocephalic and atraumatic.  Eyes:     Pupils: Pupils are equal, round, and reactive to light.  Cardiovascular:     Rate and Rhythm: Normal rate and regular rhythm.     Heart sounds: Normal heart sounds.  Pulmonary:     Effort: Pulmonary effort is normal.     Breath sounds: Normal breath sounds.  Abdominal:     General: Bowel sounds are normal.     Palpations: Abdomen is soft.  Musculoskeletal:         General: No tenderness or deformity. Normal range of motion.     Cervical back: Normal range of motion.  Lymphadenopathy:     Cervical: No cervical adenopathy.  Skin:    General: Skin is warm and dry.     Findings: No erythema or rash.  Neurological:     Mental Status: She is alert and oriented to person, place, and time.  Psychiatric:        Behavior: Behavior normal.        Thought Content: Thought content normal.        Judgment: Judgment normal.      Lab Results  Component Value Date   WBC 5.6 04/06/2024   HGB  11.2 (L) 04/06/2024   HCT 34.4 (L) 04/06/2024   MCV 91.2 04/06/2024   PLT 251 04/06/2024     Chemistry      Component Value Date/Time   NA 139 04/06/2024 1028   K 3.6 04/06/2024 1028   CL 106 04/06/2024 1028   CO2 21 (L) 04/06/2024 1028   BUN 7 04/06/2024 1028   CREATININE 0.59 04/06/2024 1028   CREATININE 0.62 10/17/2022 0947      Component Value Date/Time   CALCIUM 9.1 04/06/2024 1028   ALKPHOS 156 (H) 04/06/2024 1028   AST 22 04/06/2024 1028   ALT 13 04/06/2024 1028   BILITOT <0.2 04/06/2024 1028      Impression and Plan: Ms. Burpee is a very nice 48 year old premenopausal African American female with TRIPLE NEGATIVE breast cancer.  She has aggressive disease in my opinion.  She has already recurred.  She has recurred in her bones and in her lymph nodes.  We did get the lymph node biopsy.  Surprisingly was CD30 positive.    We really need to get a PET scan on her.  I will plan to get 1 after this cycle.  I am glad that her quality of life seems doing little bit better.  I am glad she is able to work.  She really enjoys working.  I know this is a big part of her quality of life.  I probably give her an extra week off so we get the PET scan set up.  I think the extra week off would certainly help her out.   Maude JONELLE Crease, MD 8/6/202511:24 AM

## 2024-04-06 NOTE — Progress Notes (Signed)
 HR 101.  Okay to proceed with treatment per Dr. Timmy.

## 2024-04-06 NOTE — Patient Instructions (Signed)
 [facility] - A DEPT OF [facility]  Discharge Instructions: Thank you for choosing [facility] to provide your oncology and hematology care.   If you have a lab appointment with the Cancer Center, please go directly to the Cancer Center and check in at the registration area.  Wear comfortable clothing and clothing appropriate for easy access to any Portacath or PICC line.   We strive to give you quality time with your provider. You may need to reschedule your appointment if you arrive late (15 or more minutes).  Arriving late affects you and other patients whose appointments are after yours.  Also, if you miss three or more appointments without notifying the office, you may be dismissed from the clinic at the provider's discretion.      For prescription refill requests, have your pharmacy contact our office and allow 72 hours for refills to be completed.    Today you received the following chemotherapy and/or immunotherapy agents Trastuzumab  Deruxtecan       To help prevent nausea and vomiting after your treatment, we encourage you to take your nausea medication as directed.  BELOW ARE SYMPTOMS THAT SHOULD BE REPORTED IMMEDIATELY: *FEVER GREATER THAN 100.4 F (38 C) OR HIGHER *CHILLS OR SWEATING *NAUSEA AND VOMITING THAT IS NOT CONTROLLED WITH YOUR NAUSEA MEDICATION *UNUSUAL SHORTNESS OF BREATH *UNUSUAL BRUISING OR BLEEDING *URINARY PROBLEMS (pain or burning when urinating, or frequent urination) *BOWEL PROBLEMS (unusual diarrhea, constipation, pain near the anus) TENDERNESS IN MOUTH AND THROAT WITH OR WITHOUT PRESENCE OF ULCERS (sore throat, sores in mouth, or a toothache) UNUSUAL RASH, SWELLING OR PAIN  UNUSUAL VAGINAL DISCHARGE OR ITCHING   Items with * indicate a potential emergency and should be followed up as soon as possible or go to the Emergency Department if any problems should occur.  Please show the CHEMOTHERAPY ALERT CARD or  IMMUNOTHERAPY ALERT CARD at check-in to the Emergency Department and triage nurse. Should you have questions after your visit or need to cancel or reschedule your appointment, please contact [facility] - A DEPT OF [facility]  [phone] and follow the prompts.  Office hours are 8:00 a.m. to 4:30 p.m. Monday - Friday. Please note that voicemails left after 4:00 p.m. may not be returned until the following business day.  We are closed weekends and major holidays. You have access to a nurse at all times for urgent questions. Please call the main number to the clinic [phone] and follow the prompts.  For any non-urgent questions, you may also contact your provider using MyChart. We now offer e-Visits for anyone 13 and older to request care online for non-urgent symptoms. For details visit [email or web address].   Also download the MyChart app! Go to the app store, search MyChart, open the app, select [place], and log in with your MyChart username and password.

## 2024-04-06 NOTE — Patient Instructions (Signed)

## 2024-04-07 LAB — CANCER ANTIGEN 27.29: CA 27.29: 15.2 U/mL (ref 0.0–38.6)

## 2024-04-21 ENCOUNTER — Encounter (HOSPITAL_COMMUNITY)
Admission: RE | Admit: 2024-04-21 | Discharge: 2024-04-21 | Disposition: A | Discharge status: Home / Self Care | Source: Ambulatory Visit | Attending: Hematology & Oncology | Admitting: Hematology & Oncology

## 2024-04-21 DIAGNOSIS — C50912 Malignant neoplasm of unspecified site of left female breast: Secondary | ICD-10-CM | POA: Insufficient documentation

## 2024-04-21 DIAGNOSIS — C773 Secondary and unspecified malignant neoplasm of axilla and upper limb lymph nodes: Secondary | ICD-10-CM | POA: Insufficient documentation

## 2024-04-21 DIAGNOSIS — C50919 Malignant neoplasm of unspecified site of unspecified female breast: Secondary | ICD-10-CM | POA: Diagnosis present

## 2024-04-21 LAB — GLUCOSE, CAPILLARY: Glucose-Capillary: 93 mg/dL (ref 70–99)

## 2024-04-21 MED ORDER — FLUDEOXYGLUCOSE F - 18 (FDG) INJECTION
7.0200 | Freq: Once | INTRAVENOUS | Status: AC
  Administered 2024-04-21: 7.02 via INTRAVENOUS

## 2024-04-25 ENCOUNTER — Ambulatory Visit: Payer: Self-pay | Admitting: Hematology & Oncology

## 2024-04-25 ENCOUNTER — Encounter: Payer: Self-pay | Admitting: *Deleted

## 2024-04-27 ENCOUNTER — Inpatient Hospital Stay

## 2024-04-27 ENCOUNTER — Other Ambulatory Visit

## 2024-04-27 ENCOUNTER — Ambulatory Visit

## 2024-04-27 ENCOUNTER — Ambulatory Visit: Admitting: Hematology & Oncology

## 2024-05-05 ENCOUNTER — Inpatient Hospital Stay

## 2024-05-05 ENCOUNTER — Encounter: Payer: Self-pay | Admitting: Hematology & Oncology

## 2024-05-05 ENCOUNTER — Inpatient Hospital Stay: Attending: Hematology & Oncology

## 2024-05-05 ENCOUNTER — Inpatient Hospital Stay (HOSPITAL_BASED_OUTPATIENT_CLINIC_OR_DEPARTMENT_OTHER): Admitting: Hematology & Oncology

## 2024-05-05 VITALS — BP 111/72 | HR 94 | Temp 98.1°F | Resp 20 | Ht 65.0 in | Wt 143.0 lb

## 2024-05-05 VITALS — BP 109/83 | HR 90 | Resp 18

## 2024-05-05 DIAGNOSIS — C50912 Malignant neoplasm of unspecified site of left female breast: Secondary | ICD-10-CM | POA: Insufficient documentation

## 2024-05-05 DIAGNOSIS — Z21 Asymptomatic human immunodeficiency virus [HIV] infection status: Secondary | ICD-10-CM | POA: Insufficient documentation

## 2024-05-05 DIAGNOSIS — D509 Iron deficiency anemia, unspecified: Secondary | ICD-10-CM | POA: Diagnosis not present

## 2024-05-05 DIAGNOSIS — C50919 Malignant neoplasm of unspecified site of unspecified female breast: Secondary | ICD-10-CM

## 2024-05-05 DIAGNOSIS — C7951 Secondary malignant neoplasm of bone: Secondary | ICD-10-CM | POA: Insufficient documentation

## 2024-05-05 DIAGNOSIS — Z79899 Other long term (current) drug therapy: Secondary | ICD-10-CM | POA: Insufficient documentation

## 2024-05-05 DIAGNOSIS — Z171 Estrogen receptor negative status [ER-]: Secondary | ICD-10-CM | POA: Insufficient documentation

## 2024-05-05 DIAGNOSIS — C773 Secondary and unspecified malignant neoplasm of axilla and upper limb lymph nodes: Secondary | ICD-10-CM

## 2024-05-05 LAB — CMP (CANCER CENTER ONLY)
ALT: 9 U/L (ref 0–44)
AST: 24 U/L (ref 15–41)
Albumin: 3.8 g/dL (ref 3.5–5.0)
Alkaline Phosphatase: 146 U/L — ABNORMAL HIGH (ref 38–126)
Anion gap: 11 (ref 5–15)
BUN: 9 mg/dL (ref 6–20)
CO2: 21 mmol/L — ABNORMAL LOW (ref 22–32)
Calcium: 9.2 mg/dL (ref 8.9–10.3)
Chloride: 109 mmol/L (ref 98–111)
Creatinine: 0.75 mg/dL (ref 0.44–1.00)
GFR, Estimated: >60 mL/min (ref >60)
Glucose, Bld: 105 mg/dL — ABNORMAL HIGH (ref 70–99)
Potassium: 3.5 mmol/L (ref 3.5–5.1)
Sodium: 140 mmol/L (ref 135–145)
Total Bilirubin: 0.4 mg/dL (ref 0.0–1.2)
Total Protein: 7.2 g/dL (ref 6.5–8.1)

## 2024-05-05 LAB — CBC WITH DIFFERENTIAL (CANCER CENTER ONLY)
Abs Immature Granulocytes: 0.04 K/uL (ref 0.00–0.07)
Basophils Absolute: 0 K/uL (ref 0.0–0.1)
Basophils Relative: 0 %
Eosinophils Absolute: 0.2 K/uL (ref 0.0–0.5)
Eosinophils Relative: 2 %
HCT: 34.2 % — ABNORMAL LOW (ref 36.0–46.0)
Hemoglobin: 11.4 g/dL — ABNORMAL LOW (ref 12.0–15.0)
Immature Granulocytes: 1 %
Lymphocytes Relative: 21 %
Lymphs Abs: 1.4 K/uL (ref 0.7–4.0)
MCH: 30.2 pg (ref 26.0–34.0)
MCHC: 33.3 g/dL (ref 30.0–36.0)
MCV: 90.5 fL (ref 80.0–100.0)
Monocytes Absolute: 0.6 K/uL (ref 0.1–1.0)
Monocytes Relative: 9 %
Neutro Abs: 4.6 K/uL (ref 1.7–7.7)
Neutrophils Relative %: 67 %
Platelet Count: 303 K/uL (ref 150–400)
RBC: 3.78 MIL/uL — ABNORMAL LOW (ref 3.87–5.11)
RDW: 14.8 % (ref 11.5–15.5)
WBC Count: 6.8 K/uL (ref 4.0–10.5)
nRBC: 0 % (ref 0.0–0.2)

## 2024-05-05 LAB — IRON AND IRON BINDING CAPACITY (CC-WL,HP ONLY)
Iron: 45 ug/dL (ref 28–170)
Saturation Ratios: 18 % (ref 10.4–31.8)
TIBC: 248 ug/dL — ABNORMAL LOW (ref 250–450)
UIBC: 203 ug/dL

## 2024-05-05 LAB — FERRITIN: Ferritin: 1001 ng/mL — ABNORMAL HIGH (ref 11–307)

## 2024-05-05 MED ORDER — PROCHLORPERAZINE MALEATE 10 MG PO TABS: 10.0000 mg | ORAL_TABLET | Freq: Four times a day (QID) | ORAL | 1 refills | Status: AC | PRN

## 2024-05-05 MED ORDER — DEXAMETHASONE SODIUM PHOSPHATE 10 MG/ML IJ SOLN
10.0000 mg | Freq: Once | INTRAMUSCULAR | Status: AC
  Administered 2024-05-05: 10 mg via INTRAVENOUS
  Filled 2024-05-05: qty 1

## 2024-05-05 MED ORDER — FAM-TRASTUZUMAB DERUXTECAN-NXKI CHEMO 100 MG IV SOLR
5.4000 mg/kg | Freq: Once | INTRAVENOUS | Status: AC
  Administered 2024-05-05: 300 mg via INTRAVENOUS
  Filled 2024-05-05: qty 15

## 2024-05-05 MED ORDER — OXYCODONE HCL 10 MG PO TABS: 10.0000 mg | ORAL_TABLET | Freq: Four times a day (QID) | ORAL | 0 refills | Status: DC | PRN

## 2024-05-05 MED ORDER — ACETAMINOPHEN 325 MG PO TABS
650.0000 mg | ORAL_TABLET | Freq: Once | ORAL | Status: AC
  Administered 2024-05-05: 650 mg via ORAL
  Filled 2024-05-05: qty 2

## 2024-05-05 MED ORDER — OXYCODONE HCL ER 20 MG PO T12A: 20.0000 mg | EXTENDED_RELEASE_TABLET | Freq: Two times a day (BID) | ORAL | 0 refills | Status: DC

## 2024-05-05 MED ORDER — DEXTROSE 5 % IV SOLN
INTRAVENOUS | Status: DC
Start: 2024-05-05 — End: 2024-05-05

## 2024-05-05 MED ORDER — DIPHENHYDRAMINE HCL 25 MG PO CAPS
25.0000 mg | ORAL_CAPSULE | Freq: Once | ORAL | Status: AC
  Administered 2024-05-05: 25 mg via ORAL
  Filled 2024-05-05: qty 1

## 2024-05-05 MED ORDER — SODIUM CHLORIDE 0.9 % IV SOLN
150.0000 mg | Freq: Once | INTRAVENOUS | Status: AC
  Administered 2024-05-05: 150 mg via INTRAVENOUS
  Filled 2024-05-05: qty 150

## 2024-05-05 MED ORDER — PALONOSETRON HCL INJECTION 0.25 MG/5ML
0.2500 mg | Freq: Once | INTRAVENOUS | Status: AC
  Administered 2024-05-05: 0.25 mg via INTRAVENOUS
  Filled 2024-05-05: qty 5

## 2024-05-05 NOTE — Patient Instructions (Signed)

## 2024-05-05 NOTE — Patient Instructions (Signed)
 [facility] - A DEPT OF [facility]  Discharge Instructions: Thank you for choosing [facility] to provide your oncology and hematology care.   If you have a lab appointment with the Cancer Center, please go directly to the Cancer Center and check in at the registration area.  Wear comfortable clothing and clothing appropriate for easy access to any Portacath or PICC line.   We strive to give you quality time with your provider. You may need to reschedule your appointment if you arrive late (15 or more minutes).  Arriving late affects you and other patients whose appointments are after yours.  Also, if you miss three or more appointments without notifying the office, you may be dismissed from the clinic at the provider's discretion.      For prescription refill requests, have your pharmacy contact our office and allow 72 hours for refills to be completed.    Today you received the following chemotherapy and/or immunotherapy agents Enhertu      To help prevent nausea and vomiting after your treatment, we encourage you to take your nausea medication as directed.  BELOW ARE SYMPTOMS THAT SHOULD BE REPORTED IMMEDIATELY: *FEVER GREATER THAN 100.4 F (38 C) OR HIGHER *CHILLS OR SWEATING *NAUSEA AND VOMITING THAT IS NOT CONTROLLED WITH YOUR NAUSEA MEDICATION *UNUSUAL SHORTNESS OF BREATH *UNUSUAL BRUISING OR BLEEDING *URINARY PROBLEMS (pain or burning when urinating, or frequent urination) *BOWEL PROBLEMS (unusual diarrhea, constipation, pain near the anus) TENDERNESS IN MOUTH AND THROAT WITH OR WITHOUT PRESENCE OF ULCERS (sore throat, sores in mouth, or a toothache) UNUSUAL RASH, SWELLING OR PAIN  UNUSUAL VAGINAL DISCHARGE OR ITCHING   Items with * indicate a potential emergency and should be followed up as soon as possible or go to the Emergency Department if any problems should occur.  Please show the CHEMOTHERAPY ALERT CARD or IMMUNOTHERAPY  ALERT CARD at check-in to the Emergency Department and triage nurse. Should you have questions after your visit or need to cancel or reschedule your appointment, please contact [facility] - A DEPT OF [facility]  [phone] and follow the prompts.  Office hours are 8:00 a.m. to 4:30 p.m. Monday - Friday. Please note that voicemails left after 4:00 p.m. may not be returned until the following business day.  We are closed weekends and major holidays. You have access to a nurse at all times for urgent questions. Please call the main number to the clinic [phone] and follow the prompts.  For any non-urgent questions, you may also contact your provider using MyChart. We now offer e-Visits for anyone 48 and older to request care online for non-urgent symptoms. For details visit [email or web address].   Also download the MyChart app! Go to the app store, search "MyChart", open the app, select , and log in with your MyChart username and password.

## 2024-05-05 NOTE — Progress Notes (Signed)
 Is here Hematology and Oncology Follow Up Visit  Briana [name] [id number] May 03, 1976 47 y.o. 05/05/2024   Principle Diagnosis:  Stage IIIC (T4N3M0) - TRIPLE NEGATIVE - infiltrating ductal carcinoma of the left breast -metastatic to bone -- HER2 2+ (IHC) HIV (+) Iron  deficiency anemia  Current Therapy:   Neoadjuvant  chemoimmunotherapy --- carboplatin /Taxol /pembrolizumab  ---s/p cycle #3 - start on 02/19/2023 S/p LEFT MRM - 06/08/2023 --  Residual breast cancer - 3/3 nodes (-) XRT -- Start on 07/22/2023 --completed on 09/15/2023 Xeloda  - s/p cycle #3 on 09/28/2023 Zometa  4 mg IV every 3 months-next dose to be given on 06/2024 Enhertu  5.4 mg/m IV every 3 weeks-s/p cycle #5  -- start on 01/13/2024 Radiotherapy to multiple sites-completed on 01/21/2024 IV iron -Venofer  given on 01/13/2024     Interim History:  Briana [name] is back for follow-up.  She is doing okay.  She is still working over at [employer].  It has been quite crazy over there because of the freshman.  She says that they are really different this year.  So far, to have had to be expelled.  She had a PET scan that was done.  The PET scan was done on 04/08/2024.  This showed that there was no obvious metastatic disease.  There was an area in the left seventh rib which could have been from where she had radiation.  I have say that that she has not had a good response and I would think that she would be in a very good partial remission.  Her last CA 27.29 was 15.2.  She still having some pain issues.  What is unusual is that she says when she stands up, she has pain in her hips and upper thighs.  When I examined her, I cannot find any issue with these areas.  I cannot elicit any pain.  There is no swelling.  I know we have checked her for blood clots in the past and this has been negative.    I really think she is going need to be on time-released pain medications.  We will see about put her on OxyContin  at 20 mg p.o. twice  daily.  We have on to change her Percocet to oxycodone  at 10 mg p.o. every 6 hours as needed.  I talked to her about taking the OxyContin  twice a day on schedule.  She is only to take the oxycodone  if needed for any kind of flareup.  Her appetite is okay.  She has had no problems with nausea or vomiting.  She has had no bleeding.  She has had  no diarrhea.  She has had no mouth sores.  Overall, I would say that her performance status is ECOG 1.      Wt Readings from Last 3 Encounters:  05/05/24 143 lb (64.9 kg)  04/06/24 141 lb 1.6 oz (64 kg)  03/16/24 136 lb 6.4 oz (61.9 kg)    Medications:  Current Outpatient Medications:    amLODipine  (NORVASC ) 10 MG tablet, TAKE 1 TABLET(10 MG) BY MOUTH DAILY, Disp: 90 tablet, Rfl: 0   BIKTARVY  50-200-25 MG TABS tablet, TAKE 1 TABLET DAILY, Disp: 30 tablet, Rfl: 4   dexamethasone  (DECADRON ) 4 MG tablet, Take 2 tablets (8 mg) by mouth daily for 3 days starting the day after chemotherapy. Take with food., Disp: 30 tablet, Rfl: 1   hydrOXYzine  (ATARAX ) 25 MG tablet, Take 1 tablet (25 mg total) by mouth every 8 (eight) hours as needed., Disp: 60 tablet,  Rfl: 3   lidocaine -prilocaine  (EMLA ) cream, Apply to affected area once, Disp: 30 g, Rfl: 3   ondansetron  (ZOFRAN ) 8 MG tablet, Take 1 tablet (8 mg total) by mouth every 8 (eight) hours as needed for nausea or vomiting. Start on the third day after chemotherapy., Disp: 30 tablet, Rfl: 1   oxyCODONE -acetaminophen  (PERCOCET/ROXICET) 5-325 MG tablet, Take 1 tablet by mouth every 8 (eight) hours as needed for severe pain (pain score 7-10)., Disp: 90 tablet, Rfl: 0   polyethylene glycol (MIRALAX  / GLYCOLAX ) 17 g packet, Take 17 g by mouth daily as needed for moderate constipation., Disp: 14 each, Rfl: 0   potassium chloride  SA (KLOR-CON  M) 20 MEQ tablet, Take 1 tablet (20 mEq total) by mouth 2 (two) times daily., Disp: , Rfl:    senna-docusate (SENOKOT-S) 8.6-50 MG tablet, Take 1 tablet by mouth 2 (two) times  daily., Disp: 30 tablet, Rfl: 0   triamcinolone  (KENALOG ) 0.025 % ointment, Apply topically 2 (two) times daily as needed., Disp: 454 g, Rfl: 5   dronabinol  (MARINOL ) 10 MG capsule, Dronabinol  10 mg by mouth daily, Disp: 30 capsule, Rfl: 0   megestrol  (MEGACE  ES) 625 MG/5ML suspension, Take 5 mLs (625 mg total) by mouth daily. (Patient not taking: Reported on 05/05/2024), Disp: 150 mL, Rfl: 3   methocarbamol  (ROBAXIN ) 500 MG tablet, Take 1 tablet (500 mg total) by mouth every 8 (eight) hours as needed for muscle spasms. (Patient not taking: Reported on 05/05/2024), Disp: 20 tablet, Rfl: 0   prochlorperazine  (COMPAZINE ) 10 MG tablet, Take 1 tablet (10 mg total) by mouth every 6 (six) hours as needed for nausea or vomiting., Disp: 30 tablet, Rfl: 1 No current facility-administered medications for this visit.  Facility-Administered Medications Ordered in Other Visits:    0.9 %  sodium chloride  infusion, , Intravenous, Continuous, Franchot Lauraine HERO, NP, Stopped at 01/13/24 1502   dextrose  5 % solution, , Intravenous, Continuous, Majestic Brister, Maude SAUNDERS, MD, Last Rate: 400 mL/hr at 01/13/24 1652, Infusion Verify at 01/13/24 1652   sodium chloride  flush (NS) 0.9 % injection 10 mL, 10 mL, Intracatheter, PRN, Marki Frede R, MD, 10 mL at 01/13/24 1648  Allergies:  Allergies  Allergen Reactions   Pumpkin Flavoring Agent (Non-Screening) Swelling    ONLY Lip swelling after eating pumpkin pie    Past Medical History, Surgical history, Social history, and Family History were reviewed and updated.  Review of Systems: Review of Systems  Constitutional: Negative.   HENT:  Negative.    Eyes: Negative.   Respiratory: Negative.    Cardiovascular: Negative.   Gastrointestinal: Negative.   Endocrine: Negative.   Genitourinary: Negative.    Musculoskeletal: Negative.   Skin: Negative.   Neurological: Negative.   Hematological: Negative.   Psychiatric/Behavioral: Negative.      Physical Exam: Vitals:    05/05/24 0936  BP: 111/72  Pulse: 94  Resp: 20  Temp: 98.1 F (36.7 C)  SpO2: 99%     Wt Readings from Last 3 Encounters:  05/05/24 143 lb (64.9 kg)  04/06/24 141 lb 1.6 oz (64 kg)  03/16/24 136 lb 6.4 oz (61.9 kg)    Physical Exam Vitals reviewed.  HENT:     Head: Normocephalic and atraumatic.  Eyes:     Pupils: Pupils are equal, round, and reactive to light.  Cardiovascular:     Rate and Rhythm: Normal rate and regular rhythm.     Heart sounds: Normal heart sounds.  Pulmonary:     Effort: Pulmonary  effort is normal.     Breath sounds: Normal breath sounds.  Abdominal:     General: Bowel sounds are normal.     Palpations: Abdomen is soft.  Musculoskeletal:        General: No tenderness or deformity. Normal range of motion.     Cervical back: Normal range of motion.  Lymphadenopathy:     Cervical: No cervical adenopathy.  Skin:    General: Skin is warm and dry.     Findings: No erythema or rash.  Neurological:     Mental Status: She is alert and oriented to person, place, and time.  Psychiatric:        Behavior: Behavior normal.        Thought Content: Thought content normal.        Judgment: Judgment normal.      Lab Results  Component Value Date   WBC 6.8 05/05/2024   HGB 11.4 (L) 05/05/2024   HCT 34.2 (L) 05/05/2024   MCV 90.5 05/05/2024   PLT 303 05/05/2024     Chemistry      Component Value Date/Time   NA 140 05/05/2024 0911   K 3.5 05/05/2024 0911   CL 109 05/05/2024 0911   CO2 21 (L) 05/05/2024 0911   BUN 9 05/05/2024 0911   CREATININE 0.75 05/05/2024 0911   CREATININE 0.62 10/17/2022 0947      Component Value Date/Time   CALCIUM 9.2 05/05/2024 0911   ALKPHOS 146 (H) 05/05/2024 0911   AST 24 05/05/2024 0911   ALT 9 05/05/2024 0911   BILITOT 0.4 05/05/2024 0911      Impression and Plan: Briana [name] is a very nice 48 year old premenopausal African American female with TRIPLE NEGATIVE breast cancer.  She has aggressive disease in my  opinion.  She has already recurred.  She has recurred in her bones and in her lymph nodes.  We did get the lymph node biopsy.  Surprisingly was CD30 positive.    Again, the PET scan is certainly encouraging.  I am happy about this.  We will continue her on the Enhertu .  I really think this is helping us  out.  She is tolerating it well.  She has a decent quality of life.  I will plan to get her back in 3 more weeks.  Maude JONELLE Crease, MD 9/4/20259:54 AM

## 2024-05-06 LAB — CANCER ANTIGEN 27.29: CA 27.29: 20.7 U/mL (ref 0.0–38.6)

## 2024-05-11 ENCOUNTER — Other Ambulatory Visit: Payer: Self-pay

## 2024-05-20 ENCOUNTER — Other Ambulatory Visit: Payer: Self-pay

## 2024-05-24 ENCOUNTER — Other Ambulatory Visit: Payer: Self-pay

## 2024-05-26 ENCOUNTER — Inpatient Hospital Stay

## 2024-05-26 ENCOUNTER — Inpatient Hospital Stay (HOSPITAL_BASED_OUTPATIENT_CLINIC_OR_DEPARTMENT_OTHER): Admitting: Hematology & Oncology

## 2024-05-26 ENCOUNTER — Encounter: Payer: Self-pay | Admitting: Hematology & Oncology

## 2024-05-26 VITALS — BP 112/84 | HR 72 | Resp 18

## 2024-05-26 VITALS — BP 115/86 | HR 88 | Temp 98.1°F | Resp 20 | Ht 65.0 in | Wt 144.0 lb

## 2024-05-26 DIAGNOSIS — C50912 Malignant neoplasm of unspecified site of left female breast: Secondary | ICD-10-CM | POA: Diagnosis not present

## 2024-05-26 DIAGNOSIS — C773 Secondary and unspecified malignant neoplasm of axilla and upper limb lymph nodes: Secondary | ICD-10-CM | POA: Diagnosis not present

## 2024-05-26 DIAGNOSIS — C50919 Malignant neoplasm of unspecified site of unspecified female breast: Secondary | ICD-10-CM

## 2024-05-26 DIAGNOSIS — Z21 Asymptomatic human immunodeficiency virus [HIV] infection status: Secondary | ICD-10-CM | POA: Diagnosis not present

## 2024-05-26 LAB — CBC WITH DIFFERENTIAL (CANCER CENTER ONLY)
Abs Immature Granulocytes: 0.02 K/uL (ref 0.00–0.07)
Basophils Absolute: 0 K/uL (ref 0.0–0.1)
Basophils Relative: 1 %
Eosinophils Absolute: 0.5 K/uL (ref 0.0–0.5)
Eosinophils Relative: 8 %
HCT: 33.4 % — ABNORMAL LOW (ref 36.0–46.0)
Hemoglobin: 11.2 g/dL — ABNORMAL LOW (ref 12.0–15.0)
Immature Granulocytes: 0 %
Lymphocytes Relative: 16 %
Lymphs Abs: 0.9 K/uL (ref 0.7–4.0)
MCH: 30.1 pg (ref 26.0–34.0)
MCHC: 33.5 g/dL (ref 30.0–36.0)
MCV: 89.8 fL (ref 80.0–100.0)
Monocytes Absolute: 0.6 K/uL (ref 0.1–1.0)
Monocytes Relative: 11 %
Neutro Abs: 3.7 K/uL (ref 1.7–7.7)
Neutrophils Relative %: 64 %
Platelet Count: 235 K/uL (ref 150–400)
RBC: 3.72 MIL/uL — ABNORMAL LOW (ref 3.87–5.11)
RDW: 14.4 % (ref 11.5–15.5)
WBC Count: 5.7 K/uL (ref 4.0–10.5)
nRBC: 0 % (ref 0.0–0.2)

## 2024-05-26 LAB — CMP (CANCER CENTER ONLY)
ALT: 10 U/L (ref 0–44)
AST: 25 U/L (ref 15–41)
Albumin: 3.8 g/dL (ref 3.5–5.0)
Alkaline Phosphatase: 156 U/L — ABNORMAL HIGH (ref 38–126)
Anion gap: 14 (ref 5–15)
BUN: 7 mg/dL (ref 6–20)
CO2: 20 mmol/L — ABNORMAL LOW (ref 22–32)
Calcium: 9 mg/dL (ref 8.9–10.3)
Chloride: 106 mmol/L (ref 98–111)
Creatinine: 0.69 mg/dL (ref 0.44–1.00)
GFR, Estimated: >60 mL/min (ref >60)
Glucose, Bld: 119 mg/dL — ABNORMAL HIGH (ref 70–99)
Potassium: 3.5 mmol/L (ref 3.5–5.1)
Sodium: 139 mmol/L (ref 135–145)
Total Bilirubin: 0.2 mg/dL (ref 0.0–1.2)
Total Protein: 7.3 g/dL (ref 6.5–8.1)

## 2024-05-26 LAB — IRON AND IRON BINDING CAPACITY (CC-WL,HP ONLY)
Iron: 37 ug/dL (ref 28–170)
Saturation Ratios: 14 % (ref 10.4–31.8)
TIBC: 263 ug/dL (ref 250–450)
UIBC: 226 ug/dL

## 2024-05-26 LAB — SAMPLE TO BLOOD BANK

## 2024-05-26 LAB — FERRITIN: Ferritin: 814 ng/mL — ABNORMAL HIGH (ref 11–307)

## 2024-05-26 MED ORDER — FAM-TRASTUZUMAB DERUXTECAN-NXKI CHEMO 100 MG IV SOLR
5.4000 mg/kg | Freq: Once | INTRAVENOUS | Status: AC
  Administered 2024-05-26: 300 mg via INTRAVENOUS
  Filled 2024-05-26: qty 15

## 2024-05-26 MED ORDER — DEXAMETHASONE SODIUM PHOSPHATE 10 MG/ML IJ SOLN
10.0000 mg | Freq: Once | INTRAMUSCULAR | Status: AC
  Administered 2024-05-26: 10 mg via INTRAVENOUS
  Filled 2024-05-26: qty 1

## 2024-05-26 MED ORDER — PALONOSETRON HCL INJECTION 0.25 MG/5ML
0.2500 mg | Freq: Once | INTRAVENOUS | Status: AC
  Administered 2024-05-26: 0.25 mg via INTRAVENOUS
  Filled 2024-05-26: qty 5

## 2024-05-26 MED ORDER — ACETAMINOPHEN 325 MG PO TABS
650.0000 mg | ORAL_TABLET | Freq: Once | ORAL | Status: AC
  Administered 2024-05-26: 650 mg via ORAL
  Filled 2024-05-26: qty 2

## 2024-05-26 MED ORDER — SODIUM CHLORIDE 0.9 % IV SOLN
150.0000 mg | Freq: Once | INTRAVENOUS | Status: AC
  Administered 2024-05-26: 150 mg via INTRAVENOUS
  Filled 2024-05-26: qty 150

## 2024-05-26 MED ORDER — DEXTROSE 5 % IV SOLN: INTRAVENOUS | Status: DC

## 2024-05-26 MED ORDER — DIPHENHYDRAMINE HCL 25 MG PO CAPS
25.0000 mg | ORAL_CAPSULE | Freq: Once | ORAL | Status: AC
  Administered 2024-05-26: 25 mg via ORAL
  Filled 2024-05-26: qty 1

## 2024-05-26 NOTE — Progress Notes (Signed)
 Is here Hematology and Oncology Follow Up Visit  Briana [name] [id number] 1976/06/06 48 y.o. 05/26/2024   Principle Diagnosis:  Stage IIIC (T4N3M0) - TRIPLE NEGATIVE - infiltrating ductal carcinoma of the left breast -metastatic to bone -- HER2 2+ ([facility]) HIV (+) Iron  deficiency anemia  Current Therapy:   Neoadjuvant  chemoimmunotherapy --- carboplatin /Taxol /pembrolizumab  ---s/p cycle #3 - start on 02/19/2023 S/p LEFT MRM - 06/08/2023 --  Residual breast cancer - 3/3 nodes (-) XRT -- Start on 07/22/2023 --completed on 09/15/2023 Xeloda  - s/p cycle #3 on 09/28/2023 Zometa  4 mg IV every 3 months-next dose to be given on 06/2024 Enhertu  5.4 mg/m IV every 3 weeks-s/p cycle #6  -- start on 01/13/2024 Radiotherapy to multiple sites-completed on 01/21/2024 IV iron -Venofer  given on 01/13/2024     Interim History:  [name] is back for follow-up.  She is doing okay.  She is still working over at [employer].  It has been quite crazy over there because of the freshman.  She says that they are really different this year.  It was parents weekend last weekend.  Unfortunately, there is still many parents still at school.  Her last CA 27.29 was 20.7.  Will have to continue to watch this.  She really has had no problems with fatigue or weakness.  She is eating well.  She is having no problems with nausea or vomiting.  She is having no issues with cough.  She has had no change in bowel or bladder habits.  She has had no leg swelling.  She has had no headache.  When we last saw her, her ferritin was 1000 with an iron  saturation of 18%.  Overall, I was that her performance status is probably ECOG 1. .      Wt Readings from Last 3 Encounters:  05/26/24 144 lb (65.3 kg)  05/05/24 143 lb (64.9 kg)  04/06/24 141 lb 1.6 oz (64 kg)    Medications:  Current Outpatient Medications:    amLODipine  (NORVASC ) 10 MG tablet, TAKE 1 TABLET(10 MG) BY MOUTH DAILY, Disp: 90 tablet, Rfl: 0   BIKTARVY   50-200-25 MG TABS tablet, TAKE 1 TABLET DAILY, Disp: 30 tablet, Rfl: 4   dexamethasone  (DECADRON ) 4 MG tablet, Take 2 tablets (8 mg) by mouth daily for 3 days starting the day after chemotherapy. Take with food., Disp: 30 tablet, Rfl: 1   hydrOXYzine  (ATARAX ) 25 MG tablet, Take 1 tablet (25 mg total) by mouth every 8 (eight) hours as needed., Disp: 60 tablet, Rfl: 3   lidocaine -prilocaine  (EMLA ) cream, Apply to affected area once, Disp: 30 g, Rfl: 3   megestrol  (MEGACE  ES) 625 MG/5ML suspension, Take 5 mLs (625 mg total) by mouth daily., Disp: 150 mL, Rfl: 3   methocarbamol  (ROBAXIN ) 500 MG tablet, Take 1 tablet (500 mg total) by mouth every 8 (eight) hours as needed for muscle spasms., Disp: 20 tablet, Rfl: 0   ondansetron  (ZOFRAN ) 8 MG tablet, Take 1 tablet (8 mg total) by mouth every 8 (eight) hours as needed for nausea or vomiting. Start on the third day after chemotherapy., Disp: 30 tablet, Rfl: 1   oxyCODONE  (OXYCONTIN ) 20 mg 12 hr tablet, Take 1 tablet (20 mg total) by mouth every 12 (twelve) hours., Disp: 60 tablet, Rfl: 0   oxyCODONE  10 MG TABS, Take 1 tablet (10 mg total) by mouth every 6 (six) hours as needed for severe pain (pain score 7-10)., Disp: 90 tablet, Rfl: 0   polyethylene glycol (MIRALAX  /  GLYCOLAX ) 17 g packet, Take 17 g by mouth daily as needed for moderate constipation., Disp: 14 each, Rfl: 0   potassium chloride  SA (KLOR-CON  M) 20 MEQ tablet, Take 1 tablet (20 mEq total) by mouth 2 (two) times daily., Disp: , Rfl:    prochlorperazine  (COMPAZINE ) 10 MG tablet, Take 1 tablet (10 mg total) by mouth every 6 (six) hours as needed for nausea or vomiting., Disp: 30 tablet, Rfl: 1   senna-docusate (SENOKOT-S) 8.6-50 MG tablet, Take 1 tablet by mouth 2 (two) times daily., Disp: 30 tablet, Rfl: 0   triamcinolone  (KENALOG ) 0.025 % ointment, Apply topically 2 (two) times daily as needed., Disp: 454 g, Rfl: 5   dronabinol  (MARINOL ) 10 MG capsule, Dronabinol  10 mg by mouth daily (Patient  not taking: Reported on 05/26/2024), Disp: 30 capsule, Rfl: 0 No current facility-administered medications for this visit.  Facility-Administered Medications Ordered in Other Visits:    0.9 %  sodium chloride  infusion, , Intravenous, Continuous, Franchot Lauraine HERO, NP, Stopped at 01/13/24 1502   dextrose  5 % solution, , Intravenous, Continuous, Sherlie Boyum, Maude SAUNDERS, MD, Last Rate: 400 mL/hr at 01/13/24 1652, Infusion Verify at 01/13/24 1652   sodium chloride  flush (NS) 0.9 % injection 10 mL, 10 mL, Intracatheter, PRN, Sulema Braid R, MD, 10 mL at 01/13/24 1648  Allergies:  Allergies  Allergen Reactions   Pumpkin Flavoring Agent (Non-Screening) Swelling    ONLY Lip swelling after eating pumpkin pie    Past Medical History, Surgical history, Social history, and Family History were reviewed and updated.  Review of Systems: Review of Systems  Constitutional: Negative.   HENT:  Negative.    Eyes: Negative.   Respiratory: Negative.    Cardiovascular: Negative.   Gastrointestinal: Negative.   Endocrine: Negative.   Genitourinary: Negative.    Musculoskeletal: Negative.   Skin: Negative.   Neurological: Negative.   Hematological: Negative.   Psychiatric/Behavioral: Negative.      Physical Exam: Vitals:   05/26/24 0854  BP: 115/86  Pulse: 88  Resp: 20  Temp: 98.1 F (36.7 C)  SpO2: 100%     Wt Readings from Last 3 Encounters:  05/26/24 144 lb (65.3 kg)  05/05/24 143 lb (64.9 kg)  04/06/24 141 lb 1.6 oz (64 kg)    Physical Exam Vitals reviewed.  HENT:     Head: Normocephalic and atraumatic.  Eyes:     Pupils: Pupils are equal, round, and reactive to light.  Cardiovascular:     Rate and Rhythm: Normal rate and regular rhythm.     Heart sounds: Normal heart sounds.  Pulmonary:     Effort: Pulmonary effort is normal.     Breath sounds: Normal breath sounds.  Abdominal:     General: Bowel sounds are normal.     Palpations: Abdomen is soft.  Musculoskeletal:         General: No tenderness or deformity. Normal range of motion.     Cervical back: Normal range of motion.  Lymphadenopathy:     Cervical: No cervical adenopathy.  Skin:    General: Skin is warm and dry.     Findings: No erythema or rash.  Neurological:     Mental Status: She is alert and oriented to person, place, and time.  Psychiatric:        Behavior: Behavior normal.        Thought Content: Thought content normal.        Judgment: Judgment normal.      Lab Results  Component Value Date   WBC 5.7 05/26/2024   HGB 11.2 (L) 05/26/2024   HCT 33.4 (L) 05/26/2024   MCV 89.8 05/26/2024   PLT 235 05/26/2024     Chemistry      Component Value Date/Time   NA 140 05/05/2024 0911   K 3.5 05/05/2024 0911   CL 109 05/05/2024 0911   CO2 21 (L) 05/05/2024 0911   BUN 9 05/05/2024 0911   CREATININE 0.75 05/05/2024 0911   CREATININE 0.62 10/17/2022 0947      Component Value Date/Time   CALCIUM 9.2 05/05/2024 0911   ALKPHOS 146 (H) 05/05/2024 0911   AST 24 05/05/2024 0911   ALT 9 05/05/2024 0911   BILITOT 0.4 05/05/2024 0911      Impression and Plan: Ms. Outten is a very nice 48 year old premenopausal African American female with TRIPLE NEGATIVE breast cancer.  She has aggressive disease in my opinion.  She has already recurred.  She has recurred in her bones and in her lymph nodes.  We did get the lymph node biopsy.  Surprisingly, the cancer was CD30 positive.    We will have to see what her tumor marker looks like.  I do not think we have to do another PET scan on her probably until November.  We will see what her iron  studies look like.  I am just happy that her quality life is doing well.  She enjoys working.  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  When she comes back, we will also do Zometa .   Maude JONELLE Crease, MD 9/25/20259:18 AM

## 2024-05-26 NOTE — Patient Instructions (Signed)

## 2024-05-26 NOTE — Progress Notes (Signed)
 Alkaline Phospate noted at 157, discussed  results with Dr. Timmy. Verbal consent given to treat today

## 2024-05-26 NOTE — Patient Instructions (Signed)
 [facility] - A DEPT OF [facility]  Discharge Instructions: Thank you for choosing [facility] to provide your oncology and hematology care.   If you have a lab appointment with the Cancer Center, please go directly to the Cancer Center and check in at the registration area.  Wear comfortable clothing and clothing appropriate for easy access to any Portacath or PICC line.   We strive to give you quality time with your provider. You may need to reschedule your appointment if you arrive late (15 or more minutes).  Arriving late affects you and other patients whose appointments are after yours.  Also, if you miss three or more appointments without notifying the office, you may be dismissed from the clinic at the provider's discretion.      For prescription refill requests, have your pharmacy contact our office and allow 72 hours for refills to be completed.    Today you received the following chemotherapy and/or immunotherapy agents Trastuzumab  Deruxtecan       To help prevent nausea and vomiting after your treatment, we encourage you to take your nausea medication as directed.  BELOW ARE SYMPTOMS THAT SHOULD BE REPORTED IMMEDIATELY: *FEVER GREATER THAN 100.4 F (38 C) OR HIGHER *CHILLS OR SWEATING *NAUSEA AND VOMITING THAT IS NOT CONTROLLED WITH YOUR NAUSEA MEDICATION *UNUSUAL SHORTNESS OF BREATH *UNUSUAL BRUISING OR BLEEDING *URINARY PROBLEMS (pain or burning when urinating, or frequent urination) *BOWEL PROBLEMS (unusual diarrhea, constipation, pain near the anus) TENDERNESS IN MOUTH AND THROAT WITH OR WITHOUT PRESENCE OF ULCERS (sore throat, sores in mouth, or a toothache) UNUSUAL RASH, SWELLING OR PAIN  UNUSUAL VAGINAL DISCHARGE OR ITCHING   Items with * indicate a potential emergency and should be followed up as soon as possible or go to the Emergency Department if any problems should occur.  Please show the CHEMOTHERAPY ALERT CARD or  IMMUNOTHERAPY ALERT CARD at check-in to the Emergency Department and triage nurse. Should you have questions after your visit or need to cancel or reschedule your appointment, please contact [facility] - A DEPT OF [facility]  [phone] and follow the prompts.  Office hours are 8:00 a.m. to 4:30 p.m. Monday - Friday. Please note that voicemails left after 4:00 p.m. may not be returned until the following business day.  We are closed weekends and major holidays. You have access to a nurse at all times for urgent questions. Please call the main number to the clinic [phone] and follow the prompts.  For any non-urgent questions, you may also contact your provider using MyChart. We now offer e-Visits for anyone 13 and older to request care online for non-urgent symptoms. For details visit [email or web address].   Also download the MyChart app! Go to the app store, search MyChart, open the app, select [place], and log in with your MyChart username and password.

## 2024-05-27 ENCOUNTER — Other Ambulatory Visit: Payer: Self-pay

## 2024-05-27 LAB — CANCER ANTIGEN 27.29: CA 27.29: 9.9 U/mL (ref 0.0–38.6)

## 2024-05-30 ENCOUNTER — Ambulatory Visit: Admitting: Infectious Diseases

## 2024-06-07 ENCOUNTER — Telehealth: Payer: Self-pay

## 2024-06-07 ENCOUNTER — Other Ambulatory Visit: Payer: Self-pay

## 2024-06-07 NOTE — Telephone Encounter (Signed)
 Patient called in to report severe bilateral leg pain. Patient reports pain is constant and worsens when sitting for prolonged periods of time. Patient reports needing to take Oxycodone  daily. Patient asking if pain could be related to radiation.

## 2024-06-07 NOTE — Progress Notes (Signed)
 Radiation Oncology         [phone] ________________________________  Name: Briana [name] MRN: [id number]  Date: 06/09/2024  DOB: 07-30-1976  Follow-Up Visit Note  CC: Almarie Waddell NOVAK, NP  Almarie Waddell NOVAK, NP  No diagnosis found.  Diagnosis:The primary encounter diagnosis was Metastasis to bone [facility]). A diagnosis of Stage IV breast cancer in female [facility]) was also pertinent to this visit.   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  -- Invasive ductal carcinoma : ER+ / PR+ / Her2 - / Grade 3.    Bony metastases from stage IV invasive lobular/ductal carcinoma of the left breast; s/p palliative radiation to T10, right ribs, sacrum, and left pelvis completed on 01/21/2024   Interval Since Last Radiation: 4 month and 18 day    Intent: Palliative  Radiation Treatment Dates:  First Treatment Date: 2024-01-07 -- Last Treatment Date: 2024-01-21 Site/Dose/Technique/Mode:  Plan Name: Spine_T10 Site: Thoracic Spine Technique: 3D Mode: Photon Dose Per Fraction: 3 Gy Prescribed Dose (Delivered / Prescribed): 30 Gy / 30 Gy Prescribed Fxs (Delivered / Prescribed): 10 / 10   Plan Name: Chest_R Site: Ribs, Right Technique: 3D Mode: Photon Dose Per Fraction: 3 Gy Prescribed Dose (Delivered / Prescribed): 30 Gy / 30 Gy Prescribed Fxs (Delivered / Prescribed): 10 / 10   Plan Name: Spine_Sacrum Site: Sacrum Technique: Isodose Plan Mode: Photon Dose Per Fraction: 3 Gy Prescribed Dose (Delivered / Prescribed): 30 Gy / 30 Gy Prescribed Fxs (Delivered / Prescribed): 10 / 10   Plan Name: Pelvis_L Site: Hip, Left Technique: Isodose Plan Mode: Photon Dose Per Fraction: 3 Gy Prescribed Dose (Delivered / Prescribed): 30 Gy / 30 Gy Prescribed Fxs  (Delivered / Prescribed): 10 / 10   Narrative:  The patient returns today for routine follow-up. She was last seen in office on 02/24/34 for a follow up visit. Patient continued to follow up with their specialists to manage their chronic conditions.   In the interval since she was last seen, she presented for a follow up visit with Dr. Timmy on 05/05/24 during which OxyContin  at 20 mg p.o. twice daily was prescribed to manage her pains.   She underwent a PET scan on 04/21/24 showing a new hypermetabolic activity associated with the posterior left seventh rib SUV max equal 5.7. this finding, may relate to radiation therapy with pathologic fracture. Solitary skeletal metastasis would be less likely but not entirely excluded. Scan also noted no residual hypermetabolic lymph nodes in the left axilla or distant metastatic adenopathy or visceral metastasis.  She recently messaged us  on 10/7 complaining of worsening severe bilateral leg pain and reports that she often needs to take Oxycodone  daily.    No other significant oncologic interval history since the patient was last seen.    Allergies:  is allergic to pumpkin flavoring agent (non-screening).  Meds: Current Outpatient Medications  Medication Sig Dispense Refill   amLODipine  (NORVASC ) 10 MG tablet TAKE 1 TABLET(10 MG) BY MOUTH DAILY 90 tablet 0   BIKTARVY  50-200-25 MG TABS tablet TAKE 1 TABLET DAILY 30 tablet 4   dexamethasone  (DECADRON ) 4 MG tablet Take 2 tablets (8 mg) by mouth daily for 3 days starting the day after chemotherapy. Take with food. 30 tablet 1   dronabinol  (  MARINOL ) 10 MG capsule Dronabinol  10 mg by mouth daily (Patient not taking: Reported on 05/26/2024) 30 capsule 0   hydrOXYzine  (ATARAX ) 25 MG tablet Take 1 tablet (25 mg total) by mouth every 8 (eight) hours as needed. 60 tablet 3   lidocaine -prilocaine  (EMLA ) cream Apply to affected area once 30 g 3   megestrol  (MEGACE  ES) 625 MG/5ML suspension Take 5 mLs (625 mg total) by  mouth daily. 150 mL 3   methocarbamol  (ROBAXIN ) 500 MG tablet Take 1 tablet (500 mg total) by mouth every 8 (eight) hours as needed for muscle spasms. 20 tablet 0   ondansetron  (ZOFRAN ) 8 MG tablet Take 1 tablet (8 mg total) by mouth every 8 (eight) hours as needed for nausea or vomiting. Start on the third day after chemotherapy. 30 tablet 1   oxyCODONE  (OXYCONTIN ) 20 mg 12 hr tablet Take 1 tablet (20 mg total) by mouth every 12 (twelve) hours. 60 tablet 0   oxyCODONE  10 MG TABS Take 1 tablet (10 mg total) by mouth every 6 (six) hours as needed for severe pain (pain score 7-10). 90 tablet 0   polyethylene glycol (MIRALAX  / GLYCOLAX ) 17 g packet Take 17 g by mouth daily as needed for moderate constipation. 14 each 0   potassium chloride  SA (KLOR-CON  M) 20 MEQ tablet Take 1 tablet (20 mEq total) by mouth 2 (two) times daily.     prochlorperazine  (COMPAZINE ) 10 MG tablet Take 1 tablet (10 mg total) by mouth every 6 (six) hours as needed for nausea or vomiting. 30 tablet 1   senna-docusate (SENOKOT-S) 8.6-50 MG tablet Take 1 tablet by mouth 2 (two) times daily. 30 tablet 0   triamcinolone  (KENALOG ) 0.025 % ointment Apply topically 2 (two) times daily as needed. 454 g 5   No current facility-administered medications for this visit.   Facility-Administered Medications Ordered in Other Visits  Medication Dose Route Frequency Provider Last Rate Last Admin   0.9 %  sodium chloride  infusion   Intravenous Continuous Franchot Lauraine HERO, NP   Stopped at 01/13/24 1502   dextrose  5 % solution   Intravenous Continuous Timmy Maude SAUNDERS, MD 400 mL/hr at 01/13/24 1652 Infusion Verify at 01/13/24 1652   sodium chloride  flush (NS) 0.9 % injection 10 mL  10 mL Intracatheter PRN Timmy Maude SAUNDERS, MD   10 mL at 01/13/24 1648    Physical Findings: The patient is in no acute distress. Patient is alert and oriented.  vitals were not taken for this visit. .  No significant changes. Lungs are clear to auscultation  bilaterally. Heart has regular rate and rhythm. No palpable cervical, supraclavicular, or axillary adenopathy. Abdomen soft, non-tender, normal bowel sounds.   Lab Findings: Lab Results  Component Value Date   WBC 5.7 05/26/2024   HGB 11.2 (L) 05/26/2024   HCT 33.4 (L) 05/26/2024   MCV 89.8 05/26/2024   PLT 235 05/26/2024    Radiographic Findings: No results found.  Impression: Metastasis to bone (HCC). A diagnosis of Stage IV breast cancer in female [facility])  The patient is recovering from the effects of radiation.  ***  Plan:  ***   *** minutes of total time was spent for this patient encounter, including preparation, face-to-face counseling with the patient and coordination of care, physical exam, and documentation of the encounter. ____________________________________  Lynwood CHARM Nasuti, PhD, MD  This document serves as a record of services personally performed by Lynwood Nasuti, MD. It was created on his behalf by Reymundo Cartwright,  a trained medical scribe. The creation of this record is based on the scribe's personal observations and the provider's statements to them. This document has been checked and approved by the attending provider.

## 2024-06-09 ENCOUNTER — Ambulatory Visit
Admission: RE | Admit: 2024-06-09 | Discharge: 2024-06-09 | Disposition: A | Discharge status: Home / Self Care | Source: Ambulatory Visit | Attending: Radiation Oncology | Admitting: Radiation Oncology

## 2024-06-09 ENCOUNTER — Encounter: Payer: Self-pay | Admitting: Radiation Oncology

## 2024-06-09 VITALS — HR 89 | Temp 97.8°F | Resp 18 | Ht 65.0 in | Wt 143.6 lb

## 2024-06-09 DIAGNOSIS — C50419 Malignant neoplasm of upper-outer quadrant of unspecified female breast: Secondary | ICD-10-CM | POA: Insufficient documentation

## 2024-06-09 DIAGNOSIS — M25552 Pain in left hip: Secondary | ICD-10-CM | POA: Diagnosis not present

## 2024-06-09 DIAGNOSIS — C773 Secondary and unspecified malignant neoplasm of axilla and upper limb lymph nodes: Secondary | ICD-10-CM | POA: Diagnosis not present

## 2024-06-09 DIAGNOSIS — Z79899 Other long term (current) drug therapy: Secondary | ICD-10-CM | POA: Insufficient documentation

## 2024-06-09 DIAGNOSIS — Z923 Personal history of irradiation: Secondary | ICD-10-CM | POA: Diagnosis not present

## 2024-06-09 DIAGNOSIS — Z7952 Long term (current) use of systemic steroids: Secondary | ICD-10-CM | POA: Insufficient documentation

## 2024-06-09 DIAGNOSIS — C7951 Secondary malignant neoplasm of bone: Secondary | ICD-10-CM | POA: Diagnosis present

## 2024-06-09 DIAGNOSIS — C50412 Malignant neoplasm of upper-outer quadrant of left female breast: Secondary | ICD-10-CM | POA: Insufficient documentation

## 2024-06-09 DIAGNOSIS — Z171 Estrogen receptor negative status [ER-]: Secondary | ICD-10-CM | POA: Diagnosis not present

## 2024-06-09 DIAGNOSIS — M25551 Pain in right hip: Secondary | ICD-10-CM | POA: Diagnosis not present

## 2024-06-09 DIAGNOSIS — Z1732 Human epidermal growth factor receptor 2 negative status: Secondary | ICD-10-CM | POA: Insufficient documentation

## 2024-06-09 DIAGNOSIS — Z1722 Progesterone receptor negative status: Secondary | ICD-10-CM | POA: Insufficient documentation

## 2024-06-09 DIAGNOSIS — M898X9 Other specified disorders of bone, unspecified site: Secondary | ICD-10-CM

## 2024-06-09 DIAGNOSIS — C50919 Malignant neoplasm of unspecified site of unspecified female breast: Secondary | ICD-10-CM

## 2024-06-09 DIAGNOSIS — C50912 Malignant neoplasm of unspecified site of left female breast: Secondary | ICD-10-CM

## 2024-06-09 NOTE — Progress Notes (Signed)
 Briana [name] is here today for follow up post radiation to the breast.   Breast Side:Left breast, T spine, Left pelvis     Does the patient complain of any of the following: Post radiation skin issues: No Breast Tenderness: No Breast Swelling: No Lymphadema: No Range of Motion limitations: No Fatigue post radiation: No Appetite good/fair/poor: Good Bowels: No Bladder:No Pain: Yes leg pain, Rates pain at 8/10. Patient taking Oxycodone  that is mildly effective.  Reports worsening pain after sitting for prolonged periods of time.   Additional comments if applicable:   Pulse 89   Temp 97.8 F (36.6 C)   Resp 18   Ht 5' 5 (1.651 m)   Wt 143 lb 9.6 oz (65.1 kg)   SpO2 100%   BMI 23.90 kg/m

## 2024-06-10 ENCOUNTER — Ambulatory Visit (HOSPITAL_COMMUNITY): Admission: RE | Admit: 2024-06-10 | Source: Ambulatory Visit

## 2024-06-11 ENCOUNTER — Ambulatory Visit (HOSPITAL_COMMUNITY)
Admission: RE | Admit: 2024-06-11 | Discharge: 2024-06-11 | Disposition: A | Discharge status: Home / Self Care | Source: Ambulatory Visit | Attending: Radiology | Admitting: Radiology

## 2024-06-11 DIAGNOSIS — C50412 Malignant neoplasm of upper-outer quadrant of left female breast: Secondary | ICD-10-CM | POA: Diagnosis present

## 2024-06-11 DIAGNOSIS — Z171 Estrogen receptor negative status [ER-]: Secondary | ICD-10-CM | POA: Diagnosis present

## 2024-06-11 MED ORDER — GADOBUTROL 1 MMOL/ML IV SOLN
6.0000 mL | Freq: Once | INTRAVENOUS | Status: AC | PRN
Start: 2024-06-11 — End: 2024-06-11
  Administered 2024-06-11: 6 mL via INTRAVENOUS

## 2024-06-13 ENCOUNTER — Other Ambulatory Visit: Payer: Self-pay | Admitting: Infectious Diseases

## 2024-06-13 DIAGNOSIS — B20 Human immunodeficiency virus [HIV] disease: Secondary | ICD-10-CM

## 2024-06-16 ENCOUNTER — Inpatient Hospital Stay: Admitting: Hematology & Oncology

## 2024-06-16 ENCOUNTER — Inpatient Hospital Stay

## 2024-06-16 ENCOUNTER — Other Ambulatory Visit: Payer: Self-pay

## 2024-06-23 ENCOUNTER — Ambulatory Visit: Payer: Self-pay | Admitting: Hematology & Oncology

## 2024-06-23 ENCOUNTER — Inpatient Hospital Stay: Attending: Hematology & Oncology

## 2024-06-23 ENCOUNTER — Inpatient Hospital Stay

## 2024-06-23 ENCOUNTER — Encounter: Payer: Self-pay | Admitting: Medical Oncology

## 2024-06-23 ENCOUNTER — Inpatient Hospital Stay (HOSPITAL_BASED_OUTPATIENT_CLINIC_OR_DEPARTMENT_OTHER): Admitting: Medical Oncology

## 2024-06-23 VITALS — BP 126/87 | HR 107 | Temp 98.7°F | Resp 18 | Ht 65.0 in | Wt 140.0 lb

## 2024-06-23 VITALS — BP 121/74 | HR 107 | Temp 98.4°F | Resp 18

## 2024-06-23 DIAGNOSIS — C50919 Malignant neoplasm of unspecified site of unspecified female breast: Secondary | ICD-10-CM

## 2024-06-23 DIAGNOSIS — C773 Secondary and unspecified malignant neoplasm of axilla and upper limb lymph nodes: Secondary | ICD-10-CM

## 2024-06-23 DIAGNOSIS — Z79899 Other long term (current) drug therapy: Secondary | ICD-10-CM | POA: Insufficient documentation

## 2024-06-23 DIAGNOSIS — E876 Hypokalemia: Secondary | ICD-10-CM | POA: Diagnosis not present

## 2024-06-23 DIAGNOSIS — D508 Other iron deficiency anemias: Secondary | ICD-10-CM

## 2024-06-23 DIAGNOSIS — C50912 Malignant neoplasm of unspecified site of left female breast: Secondary | ICD-10-CM | POA: Diagnosis present

## 2024-06-23 DIAGNOSIS — Z95828 Presence of other vascular implants and grafts: Secondary | ICD-10-CM | POA: Diagnosis not present

## 2024-06-23 DIAGNOSIS — Z171 Estrogen receptor negative status [ER-]: Secondary | ICD-10-CM | POA: Insufficient documentation

## 2024-06-23 DIAGNOSIS — Z923 Personal history of irradiation: Secondary | ICD-10-CM | POA: Insufficient documentation

## 2024-06-23 DIAGNOSIS — C7951 Secondary malignant neoplasm of bone: Secondary | ICD-10-CM | POA: Insufficient documentation

## 2024-06-23 DIAGNOSIS — G893 Neoplasm related pain (acute) (chronic): Secondary | ICD-10-CM | POA: Insufficient documentation

## 2024-06-23 DIAGNOSIS — Z21 Asymptomatic human immunodeficiency virus [HIV] infection status: Secondary | ICD-10-CM | POA: Insufficient documentation

## 2024-06-23 LAB — CBC WITH DIFFERENTIAL (CANCER CENTER ONLY)
Abs Immature Granulocytes: 0.08 K/uL — ABNORMAL HIGH (ref 0.00–0.07)
Basophils Absolute: 0 K/uL (ref 0.0–0.1)
Basophils Relative: 0 %
Eosinophils Absolute: 0.3 K/uL (ref 0.0–0.5)
Eosinophils Relative: 4 %
HCT: 32.3 % — ABNORMAL LOW (ref 36.0–46.0)
Hemoglobin: 10.7 g/dL — ABNORMAL LOW (ref 12.0–15.0)
Immature Granulocytes: 1 %
Lymphocytes Relative: 10 %
Lymphs Abs: 0.9 K/uL (ref 0.7–4.0)
MCH: 29.2 pg (ref 26.0–34.0)
MCHC: 33.1 g/dL (ref 30.0–36.0)
MCV: 88 fL (ref 80.0–100.0)
Monocytes Absolute: 1.1 K/uL — ABNORMAL HIGH (ref 0.1–1.0)
Monocytes Relative: 13 %
Neutro Abs: 6.2 K/uL (ref 1.7–7.7)
Neutrophils Relative %: 72 %
Platelet Count: 345 K/uL (ref 150–400)
RBC: 3.67 MIL/uL — ABNORMAL LOW (ref 3.87–5.11)
RDW: 13.7 % (ref 11.5–15.5)
WBC Count: 8.5 K/uL (ref 4.0–10.5)
nRBC: 0 % (ref 0.0–0.2)

## 2024-06-23 LAB — CMP (CANCER CENTER ONLY)
ALT: 9 U/L (ref 0–44)
AST: 22 U/L (ref 15–41)
Albumin: 4 g/dL (ref 3.5–5.0)
Alkaline Phosphatase: 165 U/L — ABNORMAL HIGH (ref 38–126)
Anion gap: 15 (ref 5–15)
BUN: 5 mg/dL — ABNORMAL LOW (ref 6–20)
CO2: 20 mmol/L — ABNORMAL LOW (ref 22–32)
Calcium: 9.3 mg/dL (ref 8.9–10.3)
Chloride: 100 mmol/L (ref 98–111)
Creatinine: 0.82 mg/dL (ref 0.44–1.00)
GFR, Estimated: >60 mL/min (ref >60)
Glucose, Bld: 109 mg/dL — ABNORMAL HIGH (ref 70–99)
Potassium: 3.4 mmol/L — ABNORMAL LOW (ref 3.5–5.1)
Sodium: 136 mmol/L (ref 135–145)
Total Bilirubin: 0.5 mg/dL (ref 0.0–1.2)
Total Protein: 8 g/dL (ref 6.5–8.1)

## 2024-06-23 LAB — IRON AND IRON BINDING CAPACITY (CC-WL,HP ONLY)
Iron: 18 ug/dL — ABNORMAL LOW (ref 28–170)
Saturation Ratios: 8 % — ABNORMAL LOW (ref 10.4–31.8)
TIBC: 231 ug/dL — ABNORMAL LOW (ref 250–450)
UIBC: 213 ug/dL

## 2024-06-23 LAB — FERRITIN: Ferritin: 895 ng/mL — ABNORMAL HIGH (ref 11–307)

## 2024-06-23 MED ORDER — DIPHENHYDRAMINE HCL 25 MG PO CAPS
25.0000 mg | ORAL_CAPSULE | Freq: Once | ORAL | Status: AC
  Administered 2024-06-23: 25 mg via ORAL
  Filled 2024-06-23: qty 1

## 2024-06-23 MED ORDER — ZOLEDRONIC ACID 4 MG/100ML IV SOLN
4.0000 mg | Freq: Once | INTRAVENOUS | Status: AC
  Administered 2024-06-23: 4 mg via INTRAVENOUS
  Filled 2024-06-23: qty 100

## 2024-06-23 MED ORDER — SODIUM CHLORIDE 0.9 % IV SOLN: INTRAVENOUS | Status: DC

## 2024-06-23 MED ORDER — ACETAMINOPHEN 325 MG PO TABS
650.0000 mg | ORAL_TABLET | Freq: Once | ORAL | Status: AC
  Administered 2024-06-23: 650 mg via ORAL
  Filled 2024-06-23: qty 2

## 2024-06-23 MED ORDER — DEXTROSE 5 % IV SOLN: INTRAVENOUS | Status: DC

## 2024-06-23 MED ORDER — DEXAMETHASONE SOD PHOSPHATE PF 10 MG/ML IJ SOLN
10.0000 mg | Freq: Once | INTRAMUSCULAR | Status: AC
  Administered 2024-06-23: 10 mg via INTRAVENOUS

## 2024-06-23 MED ORDER — SODIUM CHLORIDE 0.9 % IV SOLN
150.0000 mg | Freq: Once | INTRAVENOUS | Status: AC
  Administered 2024-06-23: 150 mg via INTRAVENOUS
  Filled 2024-06-23: qty 150

## 2024-06-23 MED ORDER — PALONOSETRON HCL INJECTION 0.25 MG/5ML
0.2500 mg | Freq: Once | INTRAVENOUS | Status: AC
  Administered 2024-06-23: 0.25 mg via INTRAVENOUS
  Filled 2024-06-23: qty 5

## 2024-06-23 MED ORDER — OXYCODONE HCL 5 MG PO TABS
10.0000 mg | ORAL_TABLET | Freq: Once | ORAL | Status: AC
  Administered 2024-06-23: 10 mg via ORAL
  Filled 2024-06-23: qty 2

## 2024-06-23 MED ORDER — OXYCODONE HCL 10 MG PO TABS: 10.0000 mg | ORAL_TABLET | Freq: Four times a day (QID) | ORAL | 0 refills | Status: DC | PRN

## 2024-06-23 MED ORDER — FAM-TRASTUZUMAB DERUXTECAN-NXKI CHEMO 100 MG IV SOLR
5.4000 mg/kg | Freq: Once | INTRAVENOUS | Status: AC
  Administered 2024-06-23: 300 mg via INTRAVENOUS
  Filled 2024-06-23: qty 15

## 2024-06-23 NOTE — Patient Instructions (Signed)

## 2024-06-23 NOTE — Patient Instructions (Signed)
 [facility] - A DEPT OF [facility]  Discharge Instructions: Thank you for choosing [facility] to provide your oncology and hematology care.   If you have a lab appointment with the Cancer Center, please go directly to the Cancer Center and check in at the registration area.  Wear comfortable clothing and clothing appropriate for easy access to any Portacath or PICC line.   We strive to give you quality time with your provider. You may need to reschedule your appointment if you arrive late (15 or more minutes).  Arriving late affects you and other patients whose appointments are after yours.  Also, if you miss three or more appointments without notifying the office, you may be dismissed from the clinic at the provider's discretion.      For prescription refill requests, have your pharmacy contact our office and allow 72 hours for refills to be completed.    Today you received the following chemotherapy and/or immunotherapy agents Trastuzumab  Deruxtecan       To help prevent nausea and vomiting after your treatment, we encourage you to take your nausea medication as directed.  BELOW ARE SYMPTOMS THAT SHOULD BE REPORTED IMMEDIATELY: *FEVER GREATER THAN 100.4 F (38 C) OR HIGHER *CHILLS OR SWEATING *NAUSEA AND VOMITING THAT IS NOT CONTROLLED WITH YOUR NAUSEA MEDICATION *UNUSUAL SHORTNESS OF BREATH *UNUSUAL BRUISING OR BLEEDING *URINARY PROBLEMS (pain or burning when urinating, or frequent urination) *BOWEL PROBLEMS (unusual diarrhea, constipation, pain near the anus) TENDERNESS IN MOUTH AND THROAT WITH OR WITHOUT PRESENCE OF ULCERS (sore throat, sores in mouth, or a toothache) UNUSUAL RASH, SWELLING OR PAIN  UNUSUAL VAGINAL DISCHARGE OR ITCHING   Items with * indicate a potential emergency and should be followed up as soon as possible or go to the Emergency Department if any problems should occur.  Please show the CHEMOTHERAPY ALERT CARD or  IMMUNOTHERAPY ALERT CARD at check-in to the Emergency Department and triage nurse. Should you have questions after your visit or need to cancel or reschedule your appointment, please contact [facility] - A DEPT OF [facility]  [phone] and follow the prompts.  Office hours are 8:00 a.m. to 4:30 p.m. Monday - Friday. Please note that voicemails left after 4:00 p.m. may not be returned until the following business day.  We are closed weekends and major holidays. You have access to a nurse at all times for urgent questions. Please call the main number to the clinic [phone] and follow the prompts.  For any non-urgent questions, you may also contact your provider using MyChart. We now offer e-Visits for anyone 19 and older to request care online for non-urgent symptoms. For details visit [email or web address].   Also download the MyChart app! Go to the app store, search MyChart, open the app, select , and log in with your MyChart username and password.Zoledronic  Acid Injection (Cancer) What is this medication? ZOLEDRONIC  ACID (ZOE le dron ik AS id) treats high calcium levels in the blood caused by cancer. It may also be used with chemotherapy to treat weakened bones caused by cancer. It works by slowing down the release of calcium from bones. This lowers calcium levels in your blood. It also makes your bones stronger and less likely to break (fracture). It belongs to a group of medications called bisphosphonates. This medicine may be used for other purposes; ask your health care provider or pharmacist if you have questions. COMMON BRAND  NAME(S): Zometa , Zometa  Powder What should I tell my care team before I take this medication? They need to know if you have any of these conditions: Dehydration Dental disease Kidney disease Liver disease Low levels of calcium in the blood Lung or breathing disease, such as asthma Receiving steroids, such as  dexamethasone  or prednisone An unusual or allergic reaction to zoledronic  acid, other medications, foods, dyes, or preservatives Pregnant or trying to get pregnant Breast-feeding How should I use this medication? This medication is injected into a vein. It is given by your care team in a hospital or clinic setting. Talk to your care team about the use of this medication in children. Special care may be needed. Overdosage: If you think you have taken too much of this medicine contact a poison control center or emergency room at once. NOTE: This medicine is only for you. Do not share this medicine with others. What if I miss a dose? Keep appointments for follow-up doses. It is important not to miss your dose. Call your care team if you are unable to keep an appointment. What may interact with this medication? Certain antibiotics given by injection Diuretics, such as bumetanide, furosemide NSAIDs, medications for pain and inflammation, such as ibuprofen  or naproxen  Teriparatide Thalidomide This list may not describe all possible interactions. Give your health care provider a list of all the medicines, herbs, non-prescription drugs, or dietary supplements you use. Also tell them if you smoke, drink alcohol, or use illegal drugs. Some items may interact with your medicine. What should I watch for while using this medication? Visit your care team for regular checks on your progress. It may be some time before you see the benefit from this medication. Some people who take this medication have severe bone, joint, or muscle pain. This medication may also increase your risk for jaw problems or a broken thigh bone. Tell your care team right away if you have severe pain in your jaw, bones, joints, or muscles. Tell you care team if you have any pain that does not go away or that gets worse. Tell your dentist and dental surgeon that you are taking this medication. You should not have major dental surgery  while on this medication. See your dentist to have a dental exam and fix any dental problems before starting this medication. Take good care of your teeth while on this medication. Make sure you see your dentist for regular follow-up appointments. You should make sure you get enough calcium and vitamin D while you are taking this medication. Discuss the foods you eat and the vitamins you take with your care team. Check with your care team if you have severe diarrhea, nausea, and vomiting, or if you sweat a lot. The loss of too much body fluid may make it dangerous for you to take this medication. You may need bloodwork while taking this medication. Talk to your care team if you wish to become pregnant or think you might be pregnant. This medication can cause serious birth defects. What side effects may I notice from receiving this medication? Side effects that you should report to your care team as soon as possible: Allergic reactions--skin rash, itching, hives, swelling of the face, lips, tongue, or throat Kidney injury--decrease in the amount of urine, swelling of the ankles, hands, or feet Low calcium level--muscle pain or cramps, confusion, tingling, or numbness in the hands or feet Osteonecrosis of the jaw--pain, swelling, or redness in the mouth, numbness of the jaw, poor  healing after dental work, unusual discharge from the mouth, visible bones in the mouth Severe bone, joint, or muscle pain Side effects that usually do not require medical attention (report to your care team if they continue or are bothersome): Constipation Fatigue Fever Loss of appetite Nausea Stomach pain This list may not describe all possible side effects. Call your doctor for medical advice about side effects. You may report side effects to FDA at 1-800-FDA-1088. Where should I keep my medication? This medication is given in a hospital or clinic. It will not be stored at home. NOTE: This sheet is a summary. It may  not cover all possible information. If you have questions about this medicine, talk to your doctor, pharmacist, or health care provider.  2024 Elsevier/Gold Standard (2021-10-11 00:00:00)

## 2024-06-23 NOTE — Progress Notes (Signed)
 Is here Hematology and Oncology Follow Up Visit  Briana [name] [id number] May 10, 1976 48 y.o. 06/23/2024   Principle Diagnosis:  Stage IIIC (T4N3M0) - TRIPLE NEGATIVE - infiltrating ductal carcinoma of the left breast -metastatic to bone -- HER2 2+ (I[facility]) HIV (+) Iron  deficiency anemia  Current Therapy:   Neoadjuvant  chemoimmunotherapy --- carboplatin /Taxol /pembrolizumab  ---s/p cycle #3 - start on 02/19/2023 S/p LEFT MRM - 06/08/2023 --  Residual breast cancer - 3/3 nodes (-) XRT -- Start on 07/22/2023 --completed on 09/15/2023 Xeloda  - s/p cycle #3 on 09/28/2023 Zometa  4 mg IV every 3 months-next dose to be given on 06/2024 Enhertu  5.4 mg/m IV every 3 weeks-s/p cycle #6  -- start on 01/13/2024 Radiotherapy to multiple sites-completed on 01/21/2024 IV iron -Venofer  given on 01/13/2024     Interim History:  Briana [name] is back for follow-up  She states that she has been well.   She has been taking her oxycodone  for her cancer related pain. She last took her Oxycontin  at 4am. She did not take her break through dose of Oxycodone  10 mg this morning as she wanted to see if she could go without it. Unfortunately her bone pain is significant her in pelvis. XRT is aware and they have already completed an MR of this area- results pending. She asks if she could have a refill of this medication.   Her last CA 27.29 was 9.9 which is down from 20.7  She really has had no problems with fatigue or weakness.  She is eating well.  She is having no problems with nausea or vomiting.  She is having no issues with cough.  She has had no change in bowel or bladder habits.  She has had no leg swelling.  She has had no headache.  When we last saw her, her ferritin was 1000 with an iron  saturation of 18%.  Overall, I was that her performance status is probably ECOG 1.  Wt Readings from Last 3 Encounters:  06/23/24 140 lb (63.5 kg)  06/09/24 143 lb 9.6 oz (65.1 kg)  05/26/24 144 lb (65.3 kg)     Medications:  Current Outpatient Medications:    amLODipine  (NORVASC ) 10 MG tablet, TAKE 1 TABLET(10 MG) BY MOUTH DAILY, Disp: 90 tablet, Rfl: 0   BIKTARVY  50-200-25 MG TABS tablet, TAKE 1 TABLET DAILY, Disp: 30 tablet, Rfl: 4   dexamethasone  (DECADRON ) 4 MG tablet, Take 2 tablets (8 mg) by mouth daily for 3 days starting the day after chemotherapy. Take with food., Disp: 30 tablet, Rfl: 1   hydrOXYzine  (ATARAX ) 25 MG tablet, Take 1 tablet (25 mg total) by mouth every 8 (eight) hours as needed., Disp: 60 tablet, Rfl: 3   lidocaine -prilocaine  (EMLA ) cream, Apply to affected area once, Disp: 30 g, Rfl: 3   methocarbamol  (ROBAXIN ) 500 MG tablet, Take 1 tablet (500 mg total) by mouth every 8 (eight) hours as needed for muscle spasms., Disp: 20 tablet, Rfl: 0   ondansetron  (ZOFRAN ) 8 MG tablet, Take 1 tablet (8 mg total) by mouth every 8 (eight) hours as needed for nausea or vomiting. Start on the third day after chemotherapy., Disp: 30 tablet, Rfl: 1   oxyCODONE  (OXYCONTIN ) 20 mg 12 hr tablet, Take 1 tablet (20 mg total) by mouth every 12 (twelve) hours., Disp: 60 tablet, Rfl: 0   polyethylene glycol (MIRALAX  / GLYCOLAX ) 17 g packet, Take 17 g by mouth daily as needed for moderate constipation., Disp: 14 each, Rfl: 0   potassium chloride  SA (KLOR-CON   M) 20 MEQ tablet, Take 1 tablet (20 mEq total) by mouth 2 (two) times daily., Disp: , Rfl:    prochlorperazine  (COMPAZINE ) 10 MG tablet, Take 1 tablet (10 mg total) by mouth every 6 (six) hours as needed for nausea or vomiting., Disp: 30 tablet, Rfl: 1   senna-docusate (SENOKOT-S) 8.6-50 MG tablet, Take 1 tablet by mouth 2 (two) times daily., Disp: 30 tablet, Rfl: 0   triamcinolone  (KENALOG ) 0.025 % ointment, Apply topically 2 (two) times daily as needed., Disp: 454 g, Rfl: 5   megestrol  (MEGACE  ES) 625 MG/5ML suspension, Take 5 mLs (625 mg total) by mouth daily. (Patient not taking: Reported on 06/23/2024), Disp: 150 mL, Rfl: 3   Oxycodone  HCl 10 MG  TABS, Take 1 tablet (10 mg total) by mouth every 6 (six) hours as needed., Disp: 60 tablet, Rfl: 0 No current facility-administered medications for this visit.  Facility-Administered Medications Ordered in Other Visits:    0.9 %  sodium chloride  infusion, , Intravenous, Continuous, Franchot Lauraine HERO, NP, Stopped at 01/13/24 1502   0.9 %  sodium chloride  infusion, , Intravenous, Continuous, Franchot Lauraine HERO, NP   dextrose  5 % solution, , Intravenous, Continuous, Ennever, Maude SAUNDERS, MD, Last Rate: 400 mL/hr at 01/13/24 1652, Infusion Verify at 01/13/24 1652   oxyCODONE  (Oxy IR/ROXICODONE ) immediate release tablet 10 mg, 10 mg, Oral, Once, Chalon Zobrist M, PA-C   sodium chloride  flush (NS) 0.9 % injection 10 mL, 10 mL, Intracatheter, PRN, Ennever, Peter R, MD, 10 mL at 01/13/24 1648   Zoledronic  Acid (ZOMETA ) IVPB 4 mg, 4 mg, Intravenous, Once, Ennever, Maude SAUNDERS, MD  Allergies:  Allergies  Allergen Reactions   Pumpkin Flavoring Agent (Non-Screening) Swelling    ONLY Lip swelling after eating pumpkin pie    Past Medical History, Surgical history, Social history, and Family History were reviewed and updated.  Review of Systems: Review of Systems  Constitutional: Negative.   HENT:  Negative.    Eyes: Negative.   Respiratory: Negative.    Cardiovascular: Negative.   Gastrointestinal: Negative.   Endocrine: Negative.   Genitourinary: Negative.    Musculoskeletal: Negative.   Skin: Negative.   Neurological: Negative.   Hematological: Negative.   Psychiatric/Behavioral: Negative.      Physical Exam: Vitals:   06/23/24 0936  BP: 126/87  Pulse: (!) 107  Resp: 18  Temp: 98.7 F (37.1 C)  SpO2: 100%      Wt Readings from Last 3 Encounters:  06/23/24 140 lb (63.5 kg)  06/09/24 143 lb 9.6 oz (65.1 kg)  05/26/24 144 lb (65.3 kg)    Physical Exam Vitals reviewed.  HENT:     Head: Normocephalic and atraumatic.  Eyes:     Pupils: Pupils are equal, round, and reactive to light.   Cardiovascular:     Rate and Rhythm: Normal rate and regular rhythm.     Heart sounds: Normal heart sounds.  Pulmonary:     Effort: Pulmonary effort is normal.     Breath sounds: Normal breath sounds.  Abdominal:     General: Bowel sounds are normal.     Palpations: Abdomen is soft.  Musculoskeletal:        General: No tenderness or deformity. Normal range of motion.     Cervical back: Normal range of motion.  Lymphadenopathy:     Cervical: No cervical adenopathy.  Skin:    General: Skin is warm and dry.     Findings: No erythema or rash.  Neurological:  Mental Status: She is alert and oriented to person, place, and time.  Psychiatric:        Behavior: Behavior normal.        Thought Content: Thought content normal.        Judgment: Judgment normal.      Lab Results  Component Value Date   WBC 8.5 06/23/2024   HGB 10.7 (L) 06/23/2024   HCT 32.3 (L) 06/23/2024   MCV 88.0 06/23/2024   PLT 345 06/23/2024     Chemistry      Component Value Date/Time   NA 136 06/23/2024 0852   K 3.4 (L) 06/23/2024 0852   CL 100 06/23/2024 0852   CO2 20 (L) 06/23/2024 0852   BUN 5 (L) 06/23/2024 0852   CREATININE 0.82 06/23/2024 0852   CREATININE 0.62 10/17/2022 0947      Component Value Date/Time   CALCIUM 9.3 06/23/2024 0852   ALKPHOS 165 (H) 06/23/2024 0852   AST 22 06/23/2024 0852   ALT 9 06/23/2024 0852   BILITOT 0.5 06/23/2024 9147     Encounter Diagnoses  Name Primary?   Breast cancer metastasized to axillary lymph node, left (HCC) Yes   Stage IV breast cancer in female [facility])    Port-A-Cath in place    Other iron  deficiency anemia    Cancer related pain     Impression and Plan: Briana [name] is a very nice 48 year old premenopausal African American female with TRIPLE NEGATIVE breast cancer.  She has aggressive disease in my opinion.  She has already recurred. She has recurred in her bones and in her lymph nodes.  We did get the lymph node biopsy.  Surprisingly, the  cancer was CD30 positive.    MR pelvis pending from 06/11/2024 PET planned for 07/11/2024  CBC essentially stable and acceptable for treatment today CMP shows mild hypokalemia with a value of 3.4. All other abnormalities stable. Calcium is within treatment parameters for her Zometa . No dental/jaw pain or upcoming planned dental work  Iron  studies pending CA 27.29 pending PMPD reviewed- She has both short and long acting oxycodone  prescribed by Dr. Timmy for her cancer related pain. Last refill was 05/05/2024 and 05/09/2024 respectively. I will refill her oxycodone  and authorize 10 mg oxycodone  today while she is getting treatment. She feels that her tachycardia is due to pain from walking in to the office this morning.   RTC 1 month MD, port labs, [place]   Lauraine HERO [place], [place] 10/23/202510:07 AM

## 2024-06-24 ENCOUNTER — Telehealth: Payer: Self-pay | Admitting: Radiology

## 2024-06-24 ENCOUNTER — Telehealth: Payer: Self-pay | Admitting: Hematology & Oncology

## 2024-06-24 DIAGNOSIS — C7951 Secondary malignant neoplasm of bone: Secondary | ICD-10-CM

## 2024-06-24 LAB — CANCER ANTIGEN 27.29: CA 27.29: 14.6 U/mL (ref 0.0–38.6)

## 2024-06-24 NOTE — Telephone Encounter (Signed)
 Called to schedule infusion per inbasket. LVM to return call for scheduling.

## 2024-06-24 NOTE — Telephone Encounter (Signed)
 I called the patient to review the result of her MRI of the pelvis. Imaging demonstrates no obvious oncologic etiology of her pain. Per Dr. Shannon, there is no clear role for radiation at this time. Patient is interested in consideration of PT to help her pain. We will defer to medical oncology for medical management of pain. She is scheduled for a restaging PET scan on 07/11/2024. If imaging reveals disease progression in the pelvis we would be happy to reassess this patient.   Referral to PT placed today. Radiation follow-up PRN.  All questions were answered.     Leeroy Due, PA-C

## 2024-06-28 ENCOUNTER — Encounter: Payer: Self-pay | Admitting: Family

## 2024-06-28 ENCOUNTER — Encounter: Payer: Self-pay | Admitting: Hematology & Oncology

## 2024-06-29 NOTE — Therapy (Incomplete)
 OUTPATIENT PHYSICAL THERAPY ONCOLOGY EVALUATION  Patient Name: Briana [name] MRN: [id number] DOB:[birth year], 48 y.o., female Today's Date: 06/29/2024  END OF SESSION:   Past Medical History:  Diagnosis Date   Abnormal Pap smear    s/p colposcopy    Anemia    Breast cancer (HCC) 12/2022   Breast cancer metastasized to axillary lymph node, left (HCC) 02/12/2023   Chronic kidney disease 03/01/2013   kidney infection   History of radiation therapy    left chest wall-07/23/23-09/15/23- Dr. Lynwood Nasuti   History of radiation therapy    T-spine,Right chest,sacrum, left hip- 01/07/24-01/21/24- Dr. Lynwood Nasuti   HIV infection [facility])    Hypertension    Screening for malignant neoplasm of the cervix    Past Surgical History:  Procedure Laterality Date   BREAST BIOPSY Left 01/20/2023   US  LT BREAST BX W LOC DEV 1ST LESION IMG BX SPEC US  GUIDE 01/20/2023 [facility]   BREAST BIOPSY Left 06/04/2023   US  LT RADIOACTIVE SEED LOC 06/04/2023 [facility]   BREAST RECONSTRUCTION WITH PLACEMENT OF TISSUE EXPANDER AND ALLODERM Left 06/08/2023   Procedure: LEFT BREAST RECONSTRUCTION WITH PLACEMENT OF TISSUE EXPANDER AND ALLODERM;  Surgeon: Arelia Filippo, MD;  Location: [facility];  Service: Plastics;  Laterality: Left;   CAPSULECTOMY Left 12/04/2023   Procedure: COMPLETE BREAST CAPSULECTOMY;  Surgeon: Arelia Filippo, MD;  Location: [facility];  Service: Plastics;  Laterality: Left;   CESAREAN SECTION     2000/2008   IR IMAGING GUIDED PORT INSERTION  02/06/2023   MASTECTOMY WITH AXILLARY LYMPH NODE DISSECTION Left 06/08/2023   Procedure: LEFT MASTECTOMY WITH TARGETED LYMPH NODE DISSECTION;  Surgeon: Vernetta Berg, MD;  Location: [facility];  Service: General;  Laterality: Left;  LMA PEC BLOCK   REMOVAL OF TISSUE EXPANDER Left 12/04/2023   Procedure: REMOVAL, TISSUE EXPANDER;  Surgeon: Arelia Filippo, MD;  Location: [facility];  Service: Plastics;  Laterality: Left;   TUBAL LIGATION     Patient Active Problem List   Diagnosis Date Noted   Malignant neoplasm of upper outer quadrant of female breast (HCC) 06/09/2024   Need for meningitis vaccination 01/05/2024   Encounter to discuss test results 01/05/2024   HIV disease (HCC) 01/04/2024   Medication monitoring encounter 01/04/2024   Health care maintenance 01/04/2024   Lytic bone lesions on xray 12/18/2023   Moderate protein malnutrition 12/18/2023   Hyponatremia 12/18/2023   Need for prophylactic vaccination and inoculation against influenza 07/07/2023   Stage IV breast cancer in female [facility]) 06/08/2023   Breast cancer metastasized to axillary lymph node, left (HCC) 02/12/2023   Breast lump in female 01/08/2023   HTN (hypertension) 04/29/2022   Routine screening for STI (sexually transmitted infection) 10/03/2020   History of long-term treatment with high-risk medication 10/03/2020   Impetigo 10/03/2020   Screening for cervical cancer 05/23/2019   Heavy menstrual bleeding 05/23/2019   Thrombocytosis 11/26/2016   Eczema 09/11/2011   History of abnormal cervical Pap smear 01/24/2011   Iron  deficiency anemia 01/23/2011   Human immunodeficiency virus (HIV) disease (HCC) 10/19/2006    PCP:    REFERRING PROVIDER: Dr. Nasuti  REFERRING DIAG: Stage IV Metastatic Breast Cancer to Bone  THERAPY DIAG:  No diagnosis found.  ONSET DATE: ***  Rationale for Evaluation and Treatment: Rehabilitation  SUBJECTIVE:  SUBJECTIVE STATEMENT:  PERTINENT HISTORY:  Pt had neoadjuvant chemotherapy for her triple negative breast cancer, followed by left Mastectomy with immediate reconstruction with TE on 06/08/2023, and radiation which ended in January 2025. She developed  swelling in the left breast and was placed on Bactim on 11/12/2023 by Rad/Onc. Saw Dr. Arelia on 11/19/23 who saw no signs of infection, but recommended compression bra for swelling. She has a tissue expander in place.  12/04/2023 Removal of left chest tissue expander due to pain, seroma, mechanical complication of expander. Pt is now Stage IV Metastatic Breast Cancer now s/p palliative radiation to the Lumbar spine and Sacrum. Current Therapy:        Neoadjuvant  chemoimmunotherapy --- carboplatin /Taxol /pembrolizumab  ---s/p cycle #3 - start on 02/19/2023 S/p LEFT MRM - 06/08/2023 --  Residual breast cancer - 3/3 nodes (-) XRT -- Start on 07/22/2023 --completed on 09/15/2023 Xeloda  - s/p cycle #3 on 09/28/2023 Zometa  4 mg IV every 3 months-next dose to be given on 06/2024 Enhertu  5.4 mg/m IV every 3 weeks-s/p cycle #6  -- start on 01/13/2024 Radiotherapy to multiple sites-completed on 01/21/2024 IV iron -Venofer  given on 01/13/2024 PAIN:  Are you having pain? {yes/no:20286} NPRS scale: ***/10 Pain location: *** Pain orientation: {Pain Orientation:25161}  PAIN TYPE: {type:313116} Pain description: {PAIN DESCRIPTION:21022940}  Aggravating factors: *** Relieving factors: ***  PRECAUTIONS: Stage IV Metastatic left Breast Cancer, HIV+  WEIGHT BEARING RESTRICTIONS: No  FALLS:  Has patient fallen in last 6 months? {fallsyesno:27318}  LIVING ENVIRONMENT: Lives with: {[facility] lives with:25569::lives with their family} Lives in: {Lives in:25570} Stairs: {yes/no:20286}; {Stairs:24000} Has following equipment at home: {Assistive devices:23999}  OCCUPATION: [employer];  LEISURE: ***  HAND DOMINANCE: right   PRIOR LEVEL OF FUNCTION: {PLOF:24004}  PATIENT GOALS: ***   OBJECTIVE: Note: Objective measures were completed at Evaluation unless otherwise noted.  COGNITION: Overall cognitive status: {cognition:24006}   PALPATION: ***  OBSERVATIONS / OTHER ASSESSMENTS:  ***  SENSATION: Light touch: {intact/deficits:24005} Stereognosis: {intact/deficits:24005} Hot/Cold: {intact/deficits:24005} Proprioception: {intact/deficits:24005}  POSTURE: ***  UPPER EXTREMITY AROM/PROM:  A/PROM RIGHT   eval   Shoulder extension   Shoulder flexion   Shoulder abduction   Shoulder internal rotation   Shoulder external rotation     (Blank rows = not tested)  A/PROM LEFT   eval  Shoulder extension   Shoulder flexion   Shoulder abduction   Shoulder internal rotation   Shoulder external rotation     (Blank rows = not tested)  CERVICAL AROM: All within normal limits:    Percent limited  Flexion   Extension   Right lateral flexion   Left lateral flexion   Right rotation   Left rotation     UPPER EXTREMITY STRENGTH: ***   LOWER EXTREMITY AROM/PROM:  A/PROM Right eval  Hip flexion   Hip extension   Hip abduction   Hip adduction   Hip internal rotation   Hip external rotation   Knee flexion   Knee extension   Ankle dorsiflexion   Ankle plantarflexion   Ankle inversion   Ankle eversion    (Blank rows = not tested)  A/PROM LEFT eval  Hip flexion   Hip extension   Hip abduction   Hip adduction   Hip internal rotation   Hip external rotation   Knee flexion   Knee extension   Ankle dorsiflexion   Ankle plantarflexion   Ankle inversion   Ankle eversion    (Blank rows = not tested)  LOWER EXTREMITY MMT: ***  LYMPHEDEMA ASSESSMENTS:  SURGERY TYPE/DATE: left Mastectomy with immediate reconstruction with TE on 06/08/2023,for Triple neg  12/04/2023 Tissue expander removed due to pain, swelling  NUMBER OF LYMPH NODES REMOVED: 1+/3  CHEMOTHERAPY: Yes, Neoadjuvant  RADIATION:YES  HORMONE TREATMENT: NO  INFECTIONS: YES  LYMPHEDEMA ASSESSMENTS:   LANDMARK RIGHT  eval  10 cm proximal to olecranon process   Olecranon process   10 cm proximal to ulnar styloid process   Just proximal to ulnar styloid process   Across hand at  thumb web space   At base of 2nd digit   (Blank rows = not tested)  LANDMARK LEFT  eval  10 cm proximal to olecranon process   Olecranon process   10 cm proximal to ulnar styloid process   Just proximal to ulnar styloid process   Across hand at thumb web space   At base of 2nd digit   (Blank rows = not tested)   LOWER EXTREMITY LANDMARK RIGHT eval  At groin   30 cm proximal to suprapatella   20 cm proximal to suprapatella   10 cm proximal to suprapatella   At midpatella / popliteal crease   30 cm proximal to floor at lateral plantar foot   20 cm proximal to floor at lateral plantar foot   10 cm proximal to floor at lateral plantar foot   Circumference of ankle/heel   5 cm proximal to 1st MTP joint   Across MTP joint   Around proximal great toe   (Blank rows = not tested)  LOWER EXTREMITY LANDMARK LEFT eval  At groin   30 cm proximal to suprapatella   20 cm proximal to suprapatella   10 cm proximal to suprapatella   At midpatella / popliteal crease   30 cm proximal to floor at lateral plantar foot   20 cm proximal to floor at lateral plantar foot   10 cm proximal to floor at lateral plantar foot   Circumference of ankle/heel   5 cm proximal to 1st MTP joint   Across MTP joint   Around proximal great toe   (Blank rows = not tested)  FUNCTIONAL TESTS:  {Functional tests:24029}  GAIT: Distance walked: *** Assistive device utilized: {Assistive devices:23999} Level of assistance: {Levels of assistance:24026} Comments: ***  L-DEX LYMPHEDEMA SCREENING:  The patient was assessed using the L-Dex machine today to produce a lymphedema index baseline score. The patient will be reassessed on a regular basis (typically every 3 months) to obtain new L-Dex scores. If the score is > 6.5 points away from his/her baseline score indicating onset of subclinical lymphedema, it will be recommended to wear a compression garment for 4 weeks, 12 hours per day and then be reassessed. If  the score continues to be > 6.5 points from baseline at reassessment, we will initiate lymphedema treatment. Assessing in this manner has a 95% rate of preventing clinically significant lymphedema.    QUICK DASH SURVEY: ***  TREATMENT DATE: ***   PATIENT EDUCATION:  Education details: *** Person educated: {Person educated:25204} Education method: {Education Method:25205} Education comprehension: {Education Comprehension:25206}  HOME EXERCISE PROGRAM: ***  ASSESSMENT:  CLINICAL IMPRESSION: Patient is a *** y.o. *** who was seen today for physical therapy evaluation and treatment for ***.   OBJECTIVE IMPAIRMENTS: {opptimpairments:25111}.   ACTIVITY LIMITATIONS: {activitylimitations:27494}  PARTICIPATION LIMITATIONS: {participationrestrictions:25113}  PERSONAL FACTORS: {Personal factors:25162} are also affecting patient's functional outcome.   REHAB POTENTIAL: {rehabpotential:25112}  CLINICAL DECISION MAKING: {clinical decision making:25114}  EVALUATION COMPLEXITY: {Evaluation complexity:25115}  GOALS: Goals reviewed with patient? {yes/no:20286}  SHORT TERM GOALS: Target date: ***  *** Baseline: Goal status: {GOALSTATUS:25110}  2.  *** Baseline:  Goal status: {GOALSTATUS:25110}  3.  *** Baseline:  Goal status: {GOALSTATUS:25110}  4.  *** Baseline:  Goal status: {GOALSTATUS:25110}  5.  *** Baseline:  Goal status: {GOALSTATUS:25110}  6.  *** Baseline:  Goal status: {GOALSTATUS:25110}  LONG TERM GOALS: Target date: ***  *** Baseline:  Goal status: {GOALSTATUS:25110}  2.  *** Baseline:  Goal status: {GOALSTATUS:25110}  3.  *** Baseline:  Goal status: {GOALSTATUS:25110}  4.  *** Baseline:  Goal status: {GOALSTATUS:25110}  5.  *** Baseline:  Goal status: {GOALSTATUS:25110}  6.  *** Baseline:  Goal status:  {GOALSTATUS:25110}  PLAN:  PT FREQUENCY: {rehab frequency:25116}  PT DURATION: {rehab duration:25117}  PLANNED INTERVENTIONS: {rehab planned interventions:25118::97110-Therapeutic exercises,97530- Therapeutic [phone]- Neuromuscular re-education,97535- Self Rjmz,02859- Manual therapy,Patient/Family education}  PLAN FOR NEXT SESSION: ***   Grayce JINNY Sheldon, PT 06/29/2024, 2:20 PM

## 2024-06-30 ENCOUNTER — Ambulatory Visit: Attending: Radiation Oncology

## 2024-07-01 ENCOUNTER — Encounter: Payer: Self-pay | Admitting: Hematology & Oncology

## 2024-07-01 ENCOUNTER — Inpatient Hospital Stay

## 2024-07-06 ENCOUNTER — Inpatient Hospital Stay: Attending: Hematology & Oncology

## 2024-07-06 VITALS — BP 109/74 | HR 93 | Temp 97.9°F | Resp 18

## 2024-07-06 DIAGNOSIS — C50912 Malignant neoplasm of unspecified site of left female breast: Secondary | ICD-10-CM | POA: Diagnosis present

## 2024-07-06 DIAGNOSIS — Z171 Estrogen receptor negative status [ER-]: Secondary | ICD-10-CM | POA: Insufficient documentation

## 2024-07-06 DIAGNOSIS — D509 Iron deficiency anemia, unspecified: Secondary | ICD-10-CM | POA: Insufficient documentation

## 2024-07-06 DIAGNOSIS — Z21 Asymptomatic human immunodeficiency virus [HIV] infection status: Secondary | ICD-10-CM | POA: Diagnosis present

## 2024-07-06 DIAGNOSIS — Z79899 Other long term (current) drug therapy: Secondary | ICD-10-CM | POA: Insufficient documentation

## 2024-07-06 DIAGNOSIS — C7951 Secondary malignant neoplasm of bone: Secondary | ICD-10-CM | POA: Diagnosis not present

## 2024-07-06 DIAGNOSIS — D508 Other iron deficiency anemias: Secondary | ICD-10-CM

## 2024-07-06 MED ORDER — IRON SUCROSE 300 MG IVPB - SIMPLE MED
300.0000 mg | Freq: Once | Status: AC
  Administered 2024-07-06: 300 mg via INTRAVENOUS
  Filled 2024-07-06: qty 300

## 2024-07-06 MED ORDER — SODIUM CHLORIDE 0.9 % IV SOLN: INTRAVENOUS | Status: DC

## 2024-07-06 NOTE — Patient Instructions (Signed)

## 2024-07-07 ENCOUNTER — Inpatient Hospital Stay

## 2024-07-07 ENCOUNTER — Inpatient Hospital Stay: Admitting: Hematology & Oncology

## 2024-07-11 ENCOUNTER — Encounter (HOSPITAL_COMMUNITY): Admission: RE | Admit: 2024-07-11 | Source: Ambulatory Visit

## 2024-07-11 ENCOUNTER — Other Ambulatory Visit: Payer: Self-pay | Admitting: *Deleted

## 2024-07-11 MED ORDER — OXYCODONE HCL ER 20 MG PO T12A: 20.0000 mg | EXTENDED_RELEASE_TABLET | Freq: Two times a day (BID) | ORAL | 0 refills | Status: AC

## 2024-07-12 ENCOUNTER — Ambulatory Visit: Admitting: Infectious Diseases

## 2024-07-13 ENCOUNTER — Other Ambulatory Visit: Payer: Self-pay

## 2024-07-13 NOTE — Therapy (Signed)
 OUTPATIENT PHYSICAL THERAPY ONCOLOGY EVALUATION  Patient Name: Briana [name] MRN: [id number] DOB:November 13, 1975, 48 y.o., female Today's Date: 07/14/2024  END OF SESSION:  PT End of Session - 07/14/24 0921     Visit Number 1    Number of Visits 17    Date for Recertification  09/08/24    Authorization Type none    PT Start Time 0802    PT Stop Time 0858    PT Time Calculation (min) 56 min    Activity Tolerance Patient tolerated treatment well    Behavior During Therapy [facility] for tasks assessed/performed          Past Medical History:  Diagnosis Date   Abnormal Pap smear    s/p colposcopy    Anemia    Breast cancer (HCC) 12/2022   Breast cancer metastasized to axillary lymph node, left (HCC) 02/12/2023   Chronic kidney disease 03/01/2013   kidney infection   History of radiation therapy    left chest wall-07/23/23-09/15/23- Dr. Lynwood Nasuti   History of radiation therapy    T-spine,Right chest,sacrum, left hip- 01/07/24-01/21/24- Dr. Lynwood Nasuti   HIV infection [facility])    Hypertension    Screening for malignant neoplasm of the cervix    Past Surgical History:  Procedure Laterality Date   BREAST BIOPSY Left 01/20/2023   US  LT BREAST BX W LOC DEV 1ST LESION IMG BX SPEC US  GUIDE 01/20/2023 [facility]   BREAST BIOPSY Left 06/04/2023   US  LT RADIOACTIVE SEED LOC 06/04/2023 [facility]   BREAST RECONSTRUCTION WITH PLACEMENT OF TISSUE EXPANDER AND ALLODERM Left 06/08/2023   Procedure: LEFT BREAST RECONSTRUCTION WITH PLACEMENT OF TISSUE EXPANDER AND ALLODERM;  Surgeon: Arelia Filippo, MD;  Location: [facility];  Service: Plastics;  Laterality: Left;   CAPSULECTOMY Left 12/04/2023   Procedure: COMPLETE BREAST CAPSULECTOMY;  Surgeon: Arelia Filippo, MD;  Location: [facility];  Service: Plastics;  Laterality: Left;   CESAREAN SECTION     2000/2008   IR IMAGING GUIDED PORT INSERTION  02/06/2023   MASTECTOMY WITH AXILLARY LYMPH NODE  DISSECTION Left 06/08/2023   Procedure: LEFT MASTECTOMY WITH TARGETED LYMPH NODE DISSECTION;  Surgeon: Vernetta Berg, MD;  Location: [facility];  Service: General;  Laterality: Left;  LMA PEC BLOCK   REMOVAL OF TISSUE EXPANDER Left 12/04/2023   Procedure: REMOVAL, TISSUE EXPANDER;  Surgeon: Arelia Filippo, MD;  Location: [facility];  Service: Plastics;  Laterality: Left;   TUBAL LIGATION     Patient Active Problem List   Diagnosis Date Noted   Malignant neoplasm of upper outer quadrant of female breast (HCC) 06/09/2024   Need for meningitis vaccination 01/05/2024   Encounter to discuss test results 01/05/2024   HIV disease (HCC) 01/04/2024   Medication monitoring encounter 01/04/2024   Health care maintenance 01/04/2024   Lytic bone lesions on xray 12/18/2023   Moderate protein malnutrition 12/18/2023   Hyponatremia 12/18/2023   Need for prophylactic vaccination and inoculation against influenza 07/07/2023   Stage IV breast cancer in female [facility]) 06/08/2023   Breast cancer metastasized to axillary lymph node, left (HCC) 02/12/2023   Breast lump in female 01/08/2023   HTN (hypertension) 04/29/2022   Routine screening for STI (sexually transmitted infection) 10/03/2020   History of long-term treatment with high-risk medication 10/03/2020   Impetigo 10/03/2020   Screening for cervical cancer 05/23/2019   Heavy menstrual bleeding 05/23/2019   Thrombocytosis 11/26/2016   Eczema 09/11/2011   History of abnormal  cervical Pap smear 01/24/2011   Iron  deficiency anemia 01/23/2011   Human immunodeficiency virus (HIV) disease (HCC) 10/19/2006    REFERRING PROVIDER: Dr. Shannon  REFERRING DIAG: Stage IV Metastatic Breast Cancer to Bone  THERAPY DIAG:  Malignant neoplasm of left breast in female, estrogen receptor negative, unspecified site of breast [facility])  Aftercare following surgery for neoplasm  Pain of both hip joints  ONSET DATE:  06/2023  Rationale for Evaluation and Treatment: Rehabilitation  SUBJECTIVE:                                                                                                                                                                                           SUBJECTIVE STATEMENT: Hip and leg pain to the knees.  That started maybe in August . Before radiation it was a random pain and now it just seems like it hurts all the time and is stiff.    PERTINENT HISTORY:  Pt had neoadjuvant chemotherapy for her triple negative breast cancer, followed by left Mastectomy with immediate reconstruction with TE on 06/08/2023, and radiation which ended in January 2025. She developed swelling in the left breast and was placed on Bactim on 11/12/2023 by Rad/Onc. Saw Dr. Arelia on 11/19/23 who saw no signs of infection, but recommended compression bra for swelling. She has a tissue expander in place.  12/04/2023 Removal of left chest tissue expander due to pain, seroma, mechanical complication of expander. Pt is now Stage IV Metastatic Breast Cancer now s/p palliative radiation to the Lumbar spine and Sacrum. Current Therapy:        Neoadjuvant  chemoimmunotherapy --- carboplatin /Taxol /pembrolizumab  ---s/p cycle #3 - start on 02/19/2023 S/p LEFT MRM - 06/08/2023 --  Residual breast cancer - 3/3 nodes (-) XRT -- Start on 07/22/2023 --completed on 09/15/2023 Xeloda  - s/p cycle #3 on 09/28/2023 Zometa  4 mg IV every 3 months-next dose to be given on 06/2024 Enhertu  5.4 mg/m IV every 3 weeks-s/p cycle #6  -- start on 01/13/2024 Radiotherapy to multiple sites-completed on 01/21/2024 IV iron -Venofer  given on 01/13/2024 Recent imaging showing bil mild to moderate hip joint effusions with indications of possible osteitis or osteonecrosis  PAIN:  Are you having pain? Yes NPRS scale: 7/10 - deep in the hips  Pain location: bil hips  Pain orientation: Bilateral  PAIN TYPE: aching, dull, and tight Pain description:  constant  Aggravating factors: I get tight when I sit too much and more pain when I walk around too much.   Relieving factors: I don't really take the pain medication because it doesn't help much.    PRECAUTIONS: Stage IV Metastatic left Breast Cancer, Hx  of bone mets in T10, right ribs, sacrum, and left pelvis, HIV+  WEIGHT BEARING RESTRICTIONS: No  FALLS:  Has patient fallen in last 6 months? No  LIVING ENVIRONMENT: Lives with: lives with their family and lives with their daughter Has following equipment at home: None  OCCUPATION: [employer]  LEISURE: not now    HAND DOMINANCE: right   PRIOR LEVEL OF FUNCTION: Independent with basic ADLs  PATIENT GOALS: decrease the pain.     OBJECTIVE: Note: Objective measures were completed at Evaluation unless otherwise noted.  COGNITION: Overall cognitive status: Within functional limits for tasks assessed   PALPATION: No ttp  OBSERVATIONS / OTHER ASSESSMENTS: walks in with antalgic gait with almost a trendelenberg pattern for the left hip  SENSATION: no CIPN or neuropathy    POSTURE: WNL in standing  Increased pain with SL stance on the left not on the Rt Stiffness into a squat at around 50% motion available  Heel and toe raise WNL  UPPER EXTREMITY AROM/PROM: WNL  CERVICAL AROM: All within normal limits:   UPPER EXTREMITY STRENGTH: WNL per pt  LOWER EXTREMITY AROM/PROM:  A/PROM Right eval  Hip flexion Passive to 95 without pain change  Hip extension   Hip abduction   Hip adduction   Hip internal rotation 5deg in supine hooklying  Hip external rotation 35deg in supine hooklying  Knee flexion WNL in prone - no quad tightness  Knee extension full  Ankle dorsiflexion   Ankle plantarflexion   Ankle inversion   Ankle eversion    (Blank rows = not tested)  A/PROM LEFT eval  Hip flexion Passive to 110 with pain  Hip extension   Hip abduction   Hip adduction   Hip internal rotation 12deg in supine hooklying   Hip external rotation 35deg in supine hooklying  Knee flexion WNL in prone   Knee extension full  Ankle dorsiflexion   Ankle plantarflexion   Ankle inversion   Ankle eversion    (Blank rows = not tested)  LOWER EXTREMITY MMT: not performed due to METs  LYMPHEDEMA ASSESSMENTS:   SURGERY TYPE/DATE: left Mastectomy with immediate reconstruction with TE on 06/08/2023,for Triple neg  12/04/2023 Tissue expander removed due to pain, swelling  NUMBER OF LYMPH NODES REMOVED: 1+/3  CHEMOTHERAPY: Yes, Neoadjuvant  RADIATION:YES  HORMONE TREATMENT: NO  INFECTIONS: YES   FUNCTIONAL TESTS:  Reports balance is normal  See above for other tests  GAIT: Distance walked: in clinic Assistive device utilized: Single point cane and Walker - 2 wheeled Comments: did not find much change in pain with AD added but did have less of a trendelenburg gait  LEFS: PATIENT SURVEYS: LEFS  Extreme difficulty/unable (0), Quite a bit of difficulty (1), Moderate difficulty (2), Little difficulty (3), No difficulty (4) Survey date:    Any of your usual work, housework or school activities 2  2. Usual hobbies, recreational or sporting activities 2  3. Getting into/out of the bath 2  4. Walking between rooms 2  5. Putting on socks/shoes 2  6. Squatting  1  7. Lifting an object, like a bag of groceries from the floor 3  8. Performing light activities around your home 3  9. Performing heavy activities around your home 1  10. Getting into/out of a car 2  11. Walking 2 blocks 1  12. Walking 1 mile 1  13. Going up/down 10 stairs (1 flight) 2  14. Standing for 1 hour 2  15.  sitting for 1  hour 2  16. Running on even ground 0  17. Running on uneven ground 0  18. Making sharp turns while running fast 1  19. Hopping  0  20. Rolling over in bed 2  Score total:  31                                                                                                                                 TREATMENT  DATE:  07/14/24 Eval performed Instructed pt in initial HEP per below - no pain with any of the exercises Edu on pool and POC as well as AD PT sent a note to [place] regarding possible osteo- findings from the MRI.    PATIENT EDUCATION:  Education details: per today's note Person educated: Patient and Parent Education method: Explanation, Demonstration, Actor cues, Verbal cues, and Handouts Education comprehension: verbalized understanding and returned demonstration  HOME EXERCISE PROGRAM: Access Code: URL: [email or web address] Date: 07/14/2024 Prepared by: Saddie Raw  Exercises - Long Sitting Quad Set  - 1 x daily - 7 x weekly - 1-3 sets - 10 reps - 5-6sec hold - Supine Bridge  - 1 x daily - 7 x weekly - 1-3 sets - 10 reps - 2-3 seconds hold - Clamshell at Wall  - 1 x daily - 7 x weekly - 1-3 sets - 10 reps - 20-30 seconds hold - Sidelying Reverse Clamshell  - 1 x daily - 7 x weekly - 1-3 sets - 10 reps - 20-30 seconds hold  ASSESSMENT:  CLINICAL IMPRESSION: Patient is a 48 y.o. female who was seen today for physical therapy evaluation and treatment for her bilateral hip pain.  She noticed that the hips were starting to hurt even before palliative radiation to the Lt iliac crest and sacrum but now it feels like everything is painful and tight all of the time.   Her recent MRI showed no oncologic reason for the continued pain with good response by her bone mets to radiation but that she does have bil joint effusion with possible osteitis or osteonecrosis starting.  A message was sent to Leeroy Due, PA-C in radiation to see if this needs to be investigated further.  She also has a PET scan today which should give more information.  She has pain with weightbearing on the left leg, stiffness of the Rt hip during PROM and more pain in the left hip with PROM vs sttiffness.  She did not notice any improvements in pain with a SPC or RW but did have improved form.  Pt  does not feel ready to use of these at this point but was educated about this.  No MMT was done due to hx of METS and possible ON.  Will start with painfree strengthening of the hips and core and gait training and progress hopefully into the pool.    OBJECTIVE IMPAIRMENTS: Abnormal gait, decreased activity tolerance, decreased knowledge of condition, decreased knowledge  of use of DME, decreased mobility, decreased ROM, and pain.   ACTIVITY LIMITATIONS: sitting, standing, squatting, sleeping, and transfers  PARTICIPATION LIMITATIONS: meal prep, cleaning, and community activity  PERSONAL FACTORS: 1-2 comorbidities: disease status, radiation hx are also affecting patient's functional outcome.   REHAB POTENTIAL: Good  CLINICAL DECISION MAKING: Evolving/moderate complexity  EVALUATION COMPLEXITY: Moderate  GOALS: Goals reviewed with patient? Yes  SHORT TERM GOALS: Target date: 08/08/24  Pt will be educated on the use of AD for consideration  Baseline: Goal status: MET   LONG TERM GOALS: Target date: 09/08/24  Pt will decrease resting hip stiffness and pain by at least 50% Baseline: Pain of 7-8/10,  Goal status: INITIAL  2.  Pt will be able to dress without limitations including socks and shoes Baseline: Reports stiffness can limit her dressing of socks and shoes Goal status: INITIAL  3.  Pt will ambulate with only minimal trendelenburg pattern Baseline:  Goal status: INITIAL  4.  Pt will be ind with self care including HEP Baseline:  Goal status: INITIAL   PLAN:  PT FREQUENCY: 2x/week  PT DURATION: 8 weeks  PLANNED INTERVENTIONS: 97110-Therapeutic exercises, 97530- Therapeutic activity, [id number]- Neuromuscular re-education, 97535- Self Care, [id number]- Manual therapy, [phone]- Gait training, [id number]- Aquatic Therapy, Patient/Family education, Stair training, and DME instructions  PLAN FOR NEXT SESSION: review PET, bil hip PROM, hip stretching bil - try SKTC, hamstring, piriformis,  windsheild wipers.  Parallel bars and gentle strengthening   Larue Saddie SAUNDERS, PT 07/14/2024, 9:22 AM

## 2024-07-14 ENCOUNTER — Ambulatory Visit: Attending: Radiation Oncology | Admitting: Rehabilitation

## 2024-07-14 ENCOUNTER — Other Ambulatory Visit: Payer: Self-pay

## 2024-07-14 ENCOUNTER — Encounter (HOSPITAL_COMMUNITY)
Admission: RE | Admit: 2024-07-14 | Discharge: 2024-07-14 | Disposition: A | Discharge status: Home / Self Care | Source: Ambulatory Visit | Attending: Hematology & Oncology | Admitting: Hematology & Oncology

## 2024-07-14 ENCOUNTER — Encounter: Payer: Self-pay | Admitting: Rehabilitation

## 2024-07-14 DIAGNOSIS — M25551 Pain in right hip: Secondary | ICD-10-CM | POA: Diagnosis present

## 2024-07-14 DIAGNOSIS — M25552 Pain in left hip: Secondary | ICD-10-CM | POA: Diagnosis present

## 2024-07-14 DIAGNOSIS — C7951 Secondary malignant neoplasm of bone: Secondary | ICD-10-CM | POA: Insufficient documentation

## 2024-07-14 DIAGNOSIS — Z483 Aftercare following surgery for neoplasm: Secondary | ICD-10-CM | POA: Diagnosis present

## 2024-07-14 DIAGNOSIS — C773 Secondary and unspecified malignant neoplasm of axilla and upper limb lymph nodes: Secondary | ICD-10-CM | POA: Insufficient documentation

## 2024-07-14 DIAGNOSIS — C50912 Malignant neoplasm of unspecified site of left female breast: Secondary | ICD-10-CM | POA: Insufficient documentation

## 2024-07-14 DIAGNOSIS — Z171 Estrogen receptor negative status [ER-]: Secondary | ICD-10-CM | POA: Insufficient documentation

## 2024-07-14 MED ORDER — FLUDEOXYGLUCOSE F - 18 (FDG) INJECTION
7.3100 | Freq: Once | INTRAVENOUS | Status: AC | PRN
  Administered 2024-07-14: 7.31 via INTRAVENOUS

## 2024-07-15 ENCOUNTER — Other Ambulatory Visit: Payer: Self-pay

## 2024-07-18 ENCOUNTER — Ambulatory Visit: Admitting: Rehabilitation

## 2024-07-20 ENCOUNTER — Ambulatory Visit

## 2024-07-20 DIAGNOSIS — Z483 Aftercare following surgery for neoplasm: Secondary | ICD-10-CM

## 2024-07-20 DIAGNOSIS — M25551 Pain in right hip: Secondary | ICD-10-CM

## 2024-07-20 DIAGNOSIS — C50912 Malignant neoplasm of unspecified site of left female breast: Secondary | ICD-10-CM | POA: Diagnosis not present

## 2024-07-20 NOTE — Therapy (Signed)
 OUTPATIENT PHYSICAL THERAPY ONCOLOGY EVALUATION  Patient Name: Briana [name] MRN: [id number] DOB:10/17/1975, 48 y.o., female Today's Date: 07/20/2024  END OF SESSION:  PT End of Session - 07/20/24 0951     Visit Number 2    Number of Visits 17    Date for Recertification  09/08/24    Authorization Type none    PT Start Time 0902    PT Stop Time 1001    PT Time Calculation (min) 59 min    Activity Tolerance Patient tolerated treatment well    Behavior During Therapy [facility] for tasks assessed/performed           Past Medical History:  Diagnosis Date   Abnormal Pap smear    s/p colposcopy    Anemia    Breast cancer (HCC) 12/2022   Breast cancer metastasized to axillary lymph node, left (HCC) 02/12/2023   Chronic kidney disease 03/01/2013   kidney infection   History of radiation therapy    left chest wall-07/23/23-09/15/23- Dr. Lynwood Nasuti   History of radiation therapy    T-spine,Right chest,sacrum, left hip- 01/07/24-01/21/24- Dr. Lynwood Nasuti   HIV infection [facility])    Hypertension    Screening for malignant neoplasm of the cervix    Past Surgical History:  Procedure Laterality Date   BREAST BIOPSY Left 01/20/2023   US  LT BREAST BX W LOC DEV 1ST LESION IMG BX SPEC US  GUIDE 01/20/2023 [facility]   BREAST BIOPSY Left 06/04/2023   US  LT RADIOACTIVE SEED LOC 06/04/2023 [facility]   BREAST RECONSTRUCTION WITH PLACEMENT OF TISSUE EXPANDER AND ALLODERM Left 06/08/2023   Procedure: LEFT BREAST RECONSTRUCTION WITH PLACEMENT OF TISSUE EXPANDER AND ALLODERM;  Surgeon: Arelia Filippo, MD;  Location: [facility];  Service: Plastics;  Laterality: Left;   CAPSULECTOMY Left 12/04/2023   Procedure: COMPLETE BREAST CAPSULECTOMY;  Surgeon: Arelia Filippo, MD;  Location: [facility];  Service: Plastics;  Laterality: Left;   CESAREAN SECTION     2000/2008   IR IMAGING GUIDED PORT INSERTION  02/06/2023   MASTECTOMY WITH AXILLARY LYMPH NODE  DISSECTION Left 06/08/2023   Procedure: LEFT MASTECTOMY WITH TARGETED LYMPH NODE DISSECTION;  Surgeon: Vernetta Berg, MD;  Location: [facility];  Service: General;  Laterality: Left;  LMA PEC BLOCK   REMOVAL OF TISSUE EXPANDER Left 12/04/2023   Procedure: REMOVAL, TISSUE EXPANDER;  Surgeon: Arelia Filippo, MD;  Location: [facility];  Service: Plastics;  Laterality: Left;   TUBAL LIGATION     Patient Active Problem List   Diagnosis Date Noted   Malignant neoplasm of upper outer quadrant of female breast (HCC) 06/09/2024   Need for meningitis vaccination 01/05/2024   Encounter to discuss test results 01/05/2024   HIV disease (HCC) 01/04/2024   Medication monitoring encounter 01/04/2024   Health care maintenance 01/04/2024   Lytic bone lesions on xray 12/18/2023   Moderate protein malnutrition 12/18/2023   Hyponatremia 12/18/2023   Need for prophylactic vaccination and inoculation against influenza 07/07/2023   Stage IV breast cancer in female [facility]) 06/08/2023   Breast cancer metastasized to axillary lymph node, left (HCC) 02/12/2023   Breast lump in female 01/08/2023   HTN (hypertension) 04/29/2022   Routine screening for STI (sexually transmitted infection) 10/03/2020   History of long-term treatment with high-risk medication 10/03/2020   Impetigo 10/03/2020   Screening for cervical cancer 05/23/2019   Heavy menstrual bleeding 05/23/2019   Thrombocytosis 11/26/2016   Eczema 09/11/2011   History of  abnormal cervical Pap smear 01/24/2011   Iron  deficiency anemia 01/23/2011   Human immunodeficiency virus (HIV) disease (HCC) 10/19/2006    REFERRING PROVIDER: Dr. Shannon  REFERRING DIAG: Stage IV Metastatic Breast Cancer to Bone  THERAPY DIAG:  Malignant neoplasm of left breast in female, estrogen receptor negative, unspecified site of breast [facility])  Aftercare following surgery for neoplasm  Pain of both hip joints  ONSET DATE:  06/2023  Rationale for Evaluation and Treatment: Rehabilitation  SUBJECTIVE:                                                                                                                                                                                           SUBECTIVE STATEMENT: There were a couple of days that my right leg was hurting and I couldn't do the LAQ. The other ones were not too bad except the reverse clam. I don't think I can use the cane at work because I have to sit, walk, drive, but they are working but mostly they have me at a post so I don't have to walk as much. Pain changes from one side to the other at different times. It never goes below the knee.  PERTINENT HISTORY:  Pt had neoadjuvant chemotherapy for her triple negative breast cancer, followed by left Mastectomy with immediate reconstruction with TE on 06/08/2023, and radiation which ended in January 2025. She developed swelling in the left breast and was placed on Bactim on 11/12/2023 by Rad/Onc. Saw Dr. Arelia on 11/19/23 who saw no signs of infection, but recommended compression bra for swelling. She has a tissue expander in place.  12/04/2023 Removal of left chest tissue expander due to pain, seroma, mechanical complication of expander. Pt is now Stage IV Metastatic Breast Cancer now s/p palliative radiation to the Lumbar spine and Sacrum. Current Therapy:        Neoadjuvant  chemoimmunotherapy --- carboplatin /Taxol /pembrolizumab  ---s/p cycle #3 - start on 02/19/2023 S/p LEFT MRM - 06/08/2023 --  Residual breast cancer - 3/3 nodes (-) XRT -- Start on 07/22/2023 --completed on 09/15/2023 Xeloda  - s/p cycle #3 on 09/28/2023 Zometa  4 mg IV every 3 months-next dose to be given on 06/2024 Enhertu  5.4 mg/m IV every 3 weeks-s/p cycle #6  -- start on 01/13/2024 Radiotherapy to multiple sites-completed on 01/21/2024 IV iron -Venofer  given on 01/13/2024 Recent imaging showing bil mild to moderate hip joint effusions with  indications of possible osteitis or osteonecrosis  PAIN:  Are you having pain? Yes NPRS scale: 7/10 - left anterior thigh Pain location: bil hips  Pain orientation: Bilateral  PAIN TYPE: aching, dull, and tight Pain description: constant  Aggravating  factors: I get tight when I sit too much and more pain when I walk around too much.   Relieving factors: I don't really take the pain medication because it doesn't help much.    PRECAUTIONS: Stage IV Metastatic left Breast Cancer, Hx of bone mets in T10, right ribs, sacrum, and left pelvis, HIV+  WEIGHT BEARING RESTRICTIONS: No  FALLS:  Has patient fallen in last 6 months? No  LIVING ENVIRONMENT: Lives with: lives with their family and lives with their daughter Has following equipment at home: None  OCCUPATION: [employer]  LEISURE: not now    HAND DOMINANCE: right   PRIOR LEVEL OF FUNCTION: Independent with basic ADLs  PATIENT GOALS: decrease the pain.     OBJECTIVE: Note: Objective measures were completed at Evaluation unless otherwise noted.  COGNITION: Overall cognitive status: Within functional limits for tasks assessed   PALPATION: No ttp  OBSERVATIONS / OTHER ASSESSMENTS: walks in with antalgic gait with almost a trendelenberg pattern for the left hip  SENSATION: no CIPN or neuropathy    POSTURE: WNL in standing  Increased pain with SL stance on the left not on the Rt Stiffness into a squat at around 50% motion available  Heel and toe raise WNL  UPPER EXTREMITY AROM/PROM: WNL  CERVICAL AROM: All within normal limits:   UPPER EXTREMITY STRENGTH: WNL per pt  LOWER EXTREMITY AROM/PROM:  A/PROM Right eval  Hip flexion Passive to 95 without pain change  Hip extension   Hip abduction   Hip adduction   Hip internal rotation 5deg in supine hooklying  Hip external rotation 35deg in supine hooklying  Knee flexion WNL in prone - no quad tightness  Knee extension full  Ankle dorsiflexion   Ankle  plantarflexion   Ankle inversion   Ankle eversion    (Blank rows = not tested)  A/PROM LEFT eval  Hip flexion Passive to 110 with pain  Hip extension   Hip abduction   Hip adduction   Hip internal rotation 12deg in supine hooklying  Hip external rotation 35deg in supine hooklying  Knee flexion WNL in prone   Knee extension full  Ankle dorsiflexion   Ankle plantarflexion   Ankle inversion   Ankle eversion    (Blank rows = not tested)  LOWER EXTREMITY MMT: not performed due to METs  LYMPHEDEMA ASSESSMENTS:   SURGERY TYPE/DATE: left Mastectomy with immediate reconstruction with TE on 06/08/2023,for Triple neg  12/04/2023 Tissue expander removed due to pain, swelling  NUMBER OF LYMPH NODES REMOVED: 1+/3  CHEMOTHERAPY: Yes, Neoadjuvant  RADIATION:YES  HORMONE TREATMENT: NO  INFECTIONS: YES   FUNCTIONAL TESTS:  Reports balance is normal  See above for other tests  GAIT: Distance walked: in clinic Assistive device utilized: Single point cane and Walker - 2 wheeled Comments: did not find much change in pain with AD added but did have less of a trendelenburg gait  LEFS: PATIENT SURVEYS: LEFS  Extreme difficulty/unable (0), Quite a bit of difficulty (1), Moderate difficulty (2), Little difficulty (3), No difficulty (4) Survey date:    Any of your usual work, housework or school activities 2  2. Usual hobbies, recreational or sporting activities 2  3. Getting into/out of the bath 2  4. Walking between rooms 2  5. Putting on socks/shoes 2  6. Squatting  1  7. Lifting an object, like a bag of groceries from the floor 3  8. Performing light activities around your home 3  9. Performing heavy activities around  your home 1  10. Getting into/out of a car 2  11. Walking 2 blocks 1  12. Walking 1 mile 1  13. Going up/down 10 stairs (1 flight) 2  14. Standing for 1 hour 2  15.  sitting for 1 hour 2  16. Running on even ground 0  17. Running on uneven ground 0  18. Making  sharp turns while running fast 1  19. Hopping  0  20. Rolling over in bed 2  Score total:  31                                                                                                                                 TREATMENT DATE:   07/20/2024 Discussed using cane to normalize gait even if it doesn't help her pain. It prevents compensation, and aggravating other areas of body Discussed doing body wt exercises only and not forcing through pain, no resistance. Gentle stretch not pain SKTC x 5 alternating Supine figure 4 stretch x 3 20 sec ea, more limited on left but improved bilaterally with each stretch. Bridge x 10, 3 sec hold Supine bent knee fall out alternating x 5 ea PROM bilateral Hamstrings 3 x 30 in supine with opp knee bent. Standing in // bars with HH, heel raises 2 x 10 Marching x 10 alternating with HH Standing 2 D hip  x 10 with B. HH, flex and abd Incline stretch x 3 30 sec Updated HEP in [employer] 07/14/24 Eval performed Instructed pt in initial HEP per below - no pain with any of the exercises Edu on pool and POC as well as AD PT sent a note to [place] regarding possible osteo- findings from the MRI.    PATIENT EDUCATION:  Access Code: [email or web address] URL: [email or web address] Date: 07/20/2024 Prepared by: Grayce Sheldon  Exercises - Supine Single Knee to Chest Stretch  - 1 x daily - 7 x weekly - 1 sets - 3 reps - 15-20 hold - Supine Hip External Rotation Stretch  - 1 x daily - 7 x weekly - 1 sets - 3 reps - 15-20 hold - Bent Knee Fallouts  - 1 x daily - 7 x weekly - 1 sets - 5 reps - 5 hold - Standing Hip Abduction with Counter Support  - 1 x daily - 7 x weekly - 1 sets - 10 reps - Standing March with Counter Support  - 1 x daily - 7 x weekly - 1 sets - 10 reps - Standing Heel Raise with Support  - 1 x daily - 7 x weekly - 2 sets - 10 reps Education details: per today's note Person educated: Patient and Parent Education method: Explanation,  Demonstration, Tactile cues, Verbal cues, and Handouts Education comprehension: verbalized understanding and returned demonstration  HOME EXERCISE PROGRAM: Access Code: URL: [email or web address] Date: 07/14/2024 Prepared by: Saddie Raw  Exercises - Long Sitting Quad Set  - 1 x daily -  7 x weekly - 1-3 sets - 10 reps - 5-6sec hold - Supine Bridge  - 1 x daily - 7 x weekly - 1-3 sets - 10 reps - 2-3 seconds hold - Clamshell at Wall  - 1 x daily - 7 x weekly - 1-3 sets - 10 reps - 20-30 seconds hold - Sidelying Reverse Clamshell  - 1 x daily - 7 x weekly - 1-3 sets - 10 reps - 20-30 seconds hold  ASSESSMENT:  CLINICAL IMPRESSION: 07/20/2024 Pt was slower moving at first with stiffness and discomfort but improved with gentle ROM and standing exs and was able to move better. Less pain reported in left leg after therapy. Pts. HEP was updated and pt was advised to keep all exs in comfortable ROM.  EVAL Patient is a 48 y.o. female who was seen today for physical therapy evaluation and treatment for her bilateral hip pain.  She noticed that the hips were starting to hurt even before palliative radiation to the Lt iliac crest and sacrum but now it feels like everything is painful and tight all of the time.   Her recent MRI showed no oncologic reason for the continued pain with good response by her bone mets to radiation but that she does have bil joint effusion with possible osteitis or osteonecrosis starting.  A message was sent to Leeroy Due, PA-C in radiation to see if this needs to be investigated further.  She also has a PET scan today which should give more information.  She has pain with weightbearing on the left leg, stiffness of the Rt hip during PROM and more pain in the left hip with PROM vs sttiffness.  She did not notice any improvements in pain with a SPC or RW but did have improved form.  Pt does not feel ready to use of these at this point but was educated about  this.  No MMT was done due to hx of METS and possible ON.  Will start with painfree strengthening of the hips and core and gait training and progress hopefully into the pool.    OBJECTIVE IMPAIRMENTS: Abnormal gait, decreased activity tolerance, decreased knowledge of condition, decreased knowledge of use of DME, decreased mobility, decreased ROM, and pain.   ACTIVITY LIMITATIONS: sitting, standing, squatting, sleeping, and transfers  PARTICIPATION LIMITATIONS: meal prep, cleaning, and community activity  PERSONAL FACTORS: 1-2 comorbidities: disease status, radiation hx are also affecting patient's functional outcome.   REHAB POTENTIAL: Good  CLINICAL DECISION MAKING: Evolving/moderate complexity  EVALUATION COMPLEXITY: Moderate  GOALS: Goals reviewed with patient? Yes  SHORT TERM GOALS: Target date: 08/08/24  Pt will be educated on the use of AD for consideration  Baseline: Goal status: MET   LONG TERM GOALS: Target date: 09/08/24  Pt will decrease resting hip stiffness and pain by at least 50% Baseline: Pain of 7-8/10,  Goal status: INITIAL  2.  Pt will be able to dress without limitations including socks and shoes Baseline: Reports stiffness can limit her dressing of socks and shoes Goal status: INITIAL  3.  Pt will ambulate with only minimal trendelenburg pattern Baseline:  Goal status: INITIAL  4.  Pt will be ind with self care including HEP Baseline:  Goal status: INITIAL   PLAN:  PT FREQUENCY: 2x/week  PT DURATION: 8 weeks  PLANNED INTERVENTIONS: 97110-Therapeutic exercises, 97530- Therapeutic activity, [id number]- Neuromuscular re-education, 97535- Self Care, [id number]- Manual therapy, [phone]- Gait training, [phone]- Aquatic Therapy, Patient/Family education, Stair training, and DME instructions  PLAN  FOR NEXT SESSION: review PET, bil hip PROM, hip stretching bil - try SKTC, hamstring, piriformis, windsheild wipers.  Parallel bars and gentle strengthening   Grayce JINNY Sheldon, PT 07/20/2024, 10:03 AM

## 2024-07-21 ENCOUNTER — Encounter: Payer: Self-pay | Admitting: Hematology & Oncology

## 2024-07-21 ENCOUNTER — Inpatient Hospital Stay

## 2024-07-21 ENCOUNTER — Telehealth: Payer: Self-pay | Admitting: Radiation Oncology

## 2024-07-21 ENCOUNTER — Other Ambulatory Visit: Payer: Self-pay

## 2024-07-21 ENCOUNTER — Inpatient Hospital Stay: Admitting: Hematology & Oncology

## 2024-07-21 VITALS — BP 104/71 | HR 99 | Resp 20

## 2024-07-21 VITALS — Ht 65.0 in | Wt 135.0 lb

## 2024-07-21 DIAGNOSIS — C773 Secondary and unspecified malignant neoplasm of axilla and upper limb lymph nodes: Secondary | ICD-10-CM | POA: Diagnosis not present

## 2024-07-21 DIAGNOSIS — C50912 Malignant neoplasm of unspecified site of left female breast: Secondary | ICD-10-CM | POA: Diagnosis not present

## 2024-07-21 DIAGNOSIS — C50919 Malignant neoplasm of unspecified site of unspecified female breast: Secondary | ICD-10-CM

## 2024-07-21 DIAGNOSIS — E876 Hypokalemia: Secondary | ICD-10-CM

## 2024-07-21 DIAGNOSIS — Z21 Asymptomatic human immunodeficiency virus [HIV] infection status: Secondary | ICD-10-CM | POA: Diagnosis not present

## 2024-07-21 LAB — CBC WITH DIFFERENTIAL (CANCER CENTER ONLY)
Abs Immature Granulocytes: 0.06 K/uL (ref 0.00–0.07)
Basophils Absolute: 0 K/uL (ref 0.0–0.1)
Basophils Relative: 0 %
Eosinophils Absolute: 0.3 K/uL (ref 0.0–0.5)
Eosinophils Relative: 4 %
HCT: 31.5 % — ABNORMAL LOW (ref 36.0–46.0)
Hemoglobin: 10.4 g/dL — ABNORMAL LOW (ref 12.0–15.0)
Immature Granulocytes: 1 %
Lymphocytes Relative: 9 %
Lymphs Abs: 0.7 K/uL (ref 0.7–4.0)
MCH: 28.7 pg (ref 26.0–34.0)
MCHC: 33 g/dL (ref 30.0–36.0)
MCV: 86.8 fL (ref 80.0–100.0)
Monocytes Absolute: 1 K/uL (ref 0.1–1.0)
Monocytes Relative: 13 %
Neutro Abs: 5.8 K/uL (ref 1.7–7.7)
Neutrophils Relative %: 73 %
Platelet Count: 341 K/uL (ref 150–400)
RBC: 3.63 MIL/uL — ABNORMAL LOW (ref 3.87–5.11)
RDW: 14.8 % (ref 11.5–15.5)
WBC Count: 7.9 K/uL (ref 4.0–10.5)
nRBC: 0 % (ref 0.0–0.2)

## 2024-07-21 LAB — CMP (CANCER CENTER ONLY)
ALT: <5 U/L (ref 0–44)
AST: 26 U/L (ref 15–41)
Albumin: 3.6 g/dL (ref 3.5–5.0)
Alkaline Phosphatase: 201 U/L — ABNORMAL HIGH (ref 38–126)
Anion gap: 14 (ref 5–15)
BUN: 6 mg/dL (ref 6–20)
CO2: 20 mmol/L — ABNORMAL LOW (ref 22–32)
Calcium: 8.7 mg/dL — ABNORMAL LOW (ref 8.9–10.3)
Chloride: 102 mmol/L (ref 98–111)
Creatinine: 0.82 mg/dL (ref 0.44–1.00)
GFR, Estimated: >60 mL/min (ref >60)
Glucose, Bld: 114 mg/dL — ABNORMAL HIGH (ref 70–99)
Potassium: 3.2 mmol/L — ABNORMAL LOW (ref 3.5–5.1)
Sodium: 136 mmol/L (ref 135–145)
Total Bilirubin: 0.5 mg/dL (ref 0.0–1.2)
Total Protein: 7.4 g/dL (ref 6.5–8.1)

## 2024-07-21 LAB — CEA (ACCESS): CEA (CHCC): 293.62 ng/mL — ABNORMAL HIGH (ref 0.00–5.00)

## 2024-07-21 LAB — IRON AND IRON BINDING CAPACITY (CC-WL,HP ONLY)
Iron: 20 ug/dL — ABNORMAL LOW (ref 28–170)
Saturation Ratios: 10 % — ABNORMAL LOW (ref 10.4–31.8)
TIBC: 200 ug/dL — ABNORMAL LOW (ref 250–450)
UIBC: 180 ug/dL

## 2024-07-21 LAB — FERRITIN: Ferritin: 1274 ng/mL — ABNORMAL HIGH (ref 11–307)

## 2024-07-21 MED ORDER — PALONOSETRON HCL INJECTION 0.25 MG/5ML
0.2500 mg | Freq: Once | INTRAVENOUS | Status: AC
  Administered 2024-07-21: 0.25 mg via INTRAVENOUS
  Filled 2024-07-21: qty 5

## 2024-07-21 MED ORDER — DEXTROSE 5 % IV SOLN: INTRAVENOUS | Status: DC

## 2024-07-21 MED ORDER — FAM-TRASTUZUMAB DERUXTECAN-NXKI CHEMO 100 MG IV SOLR
5.4000 mg/kg | Freq: Once | INTRAVENOUS | Status: AC
  Administered 2024-07-21: 300 mg via INTRAVENOUS
  Filled 2024-07-21: qty 15

## 2024-07-21 MED ORDER — POTASSIUM CHLORIDE CRYS ER 20 MEQ PO TBCR
40.0000 meq | EXTENDED_RELEASE_TABLET | Freq: Two times a day (BID) | ORAL | Status: AC
  Administered 2024-07-21 (×2): 40 meq via ORAL
  Filled 2024-07-21 (×2): qty 2

## 2024-07-21 MED ORDER — DIPHENHYDRAMINE HCL 25 MG PO CAPS
25.0000 mg | ORAL_CAPSULE | Freq: Once | ORAL | Status: AC
  Administered 2024-07-21: 25 mg via ORAL
  Filled 2024-07-21: qty 1

## 2024-07-21 MED ORDER — DEXAMETHASONE SOD PHOSPHATE PF 10 MG/ML IJ SOLN
10.0000 mg | Freq: Once | INTRAMUSCULAR | Status: AC
  Administered 2024-07-21: 10 mg via INTRAVENOUS

## 2024-07-21 MED ORDER — SODIUM CHLORIDE 0.9 % IV SOLN
150.0000 mg | Freq: Once | INTRAVENOUS | Status: AC
  Administered 2024-07-21: 150 mg via INTRAVENOUS
  Filled 2024-07-21: qty 150

## 2024-07-21 MED ORDER — ACETAMINOPHEN 325 MG PO TABS
650.0000 mg | ORAL_TABLET | Freq: Once | ORAL | Status: AC
  Administered 2024-07-21: 650 mg via ORAL
  Filled 2024-07-21: qty 2

## 2024-07-21 NOTE — Telephone Encounter (Signed)
 LVM to schedule RECON and SIM with Dr. Shannon ASAP.

## 2024-07-21 NOTE — Patient Instructions (Signed)
 [facility] - A DEPT OF [facility]  Discharge Instructions: Thank you for choosing [facility] to provide your oncology and hematology care.   If you have a lab appointment with the Cancer Center, please go directly to the Cancer Center and check in at the registration area.  Wear comfortable clothing and clothing appropriate for easy access to any Portacath or PICC line.   We strive to give you quality time with your provider. You may need to reschedule your appointment if you arrive late (15 or more minutes).  Arriving late affects you and other patients whose appointments are after yours.  Also, if you miss three or more appointments without notifying the office, you may be dismissed from the clinic at the provider's discretion.      For prescription refill requests, have your pharmacy contact our office and allow 72 hours for refills to be completed.    Today you received the following chemotherapy and/or immunotherapy agents Trastuzumab  Deruxtecan       To help prevent nausea and vomiting after your treatment, we encourage you to take your nausea medication as directed.  BELOW ARE SYMPTOMS THAT SHOULD BE REPORTED IMMEDIATELY: *FEVER GREATER THAN 100.4 F (38 C) OR HIGHER *CHILLS OR SWEATING *NAUSEA AND VOMITING THAT IS NOT CONTROLLED WITH YOUR NAUSEA MEDICATION *UNUSUAL SHORTNESS OF BREATH *UNUSUAL BRUISING OR BLEEDING *URINARY PROBLEMS (pain or burning when urinating, or frequent urination) *BOWEL PROBLEMS (unusual diarrhea, constipation, pain near the anus) TENDERNESS IN MOUTH AND THROAT WITH OR WITHOUT PRESENCE OF ULCERS (sore throat, sores in mouth, or a toothache) UNUSUAL RASH, SWELLING OR PAIN  UNUSUAL VAGINAL DISCHARGE OR ITCHING   Items with * indicate a potential emergency and should be followed up as soon as possible or go to the Emergency Department if any problems should occur.  Please show the CHEMOTHERAPY ALERT CARD or  IMMUNOTHERAPY ALERT CARD at check-in to the Emergency Department and triage nurse. Should you have questions after your visit or need to cancel or reschedule your appointment, please contact [facility] - A DEPT OF [facility]  [phone] and follow the prompts.  Office hours are 8:00 a.m. to 4:30 p.m. Monday - Friday. Please note that voicemails left after 4:00 p.m. may not be returned until the following business day.  We are closed weekends and major holidays. You have access to a nurse at all times for urgent questions. Please call the main number to the clinic [phone] and follow the prompts.  For any non-urgent questions, you may also contact your provider using MyChart. We now offer e-Visits for anyone 13 and older to request care online for non-urgent symptoms. For details visit [email or web address].   Also download the MyChart app! Go to the app store, search MyChart, open the app, select [place], and log in with your MyChart username and password.

## 2024-07-21 NOTE — Progress Notes (Signed)
 Is here Hematology and Oncology Follow Up Visit  Briana [name] [id number] 1976-03-21 48 y.o. 07/21/2024   Principle Diagnosis:  Stage IIIC (T4N3M0) - TRIPLE NEGATIVE - infiltrating ductal carcinoma of the left breast -metastatic to bone -- HER2 2+ ([facility]) HIV (+) Iron  deficiency anemia  Current Therapy:   Neoadjuvant  chemoimmunotherapy --- carboplatin /Taxol /pembrolizumab  ---s/p cycle #3 - start on 02/19/2023 S/p LEFT MRM - 06/08/2023 --  Residual breast cancer - 3/3 nodes (-) XRT -- Start on 07/22/2023 --completed on 09/15/2023 Xeloda  - s/p cycle #3 on 09/28/2023 Zometa  4 mg IV every 3 months-next dose to be given on 09/2024 Enhertu  5.4 mg/m IV every 3 weeks-s/p cycle #6  -- start on 01/13/2024 Radiotherapy to multiple sites-completed on 01/21/2024 IV iron -Venofer  given on 01/13/2024     Interim History:  Briana [name] is back for follow-up.  Briana [name] has Briana [name] looks fairly good.  However, my concern is that Briana [name] seems to have lost weight.  I think this is somewhat troublesome.  Briana [name] weight today is 135 pounds.  We did do a PET scan on Briana [name].  This was done on 07/14/2024.  The PET scan did show new metabolic activity in the right aspect of the clivus and in the left femur.  Briana [name] had persistent activity in the left seventh rib.  Briana [name] had new focal activity left axilla with some associated skin thickening.  I spoke to Dr. Shannon of Radiation Oncology.  He will get Briana [name] in and see about radiotherapy.  I think we are going to have to see about changing Briana [name] treatment at this point.  Briana [name] has been on Enhertu .  I think Briana [name] has done fairly well with this.  However, I think this can definitely is suggestive of progressive disease.  What is also unusual is that Briana [name] tumor marker has improved quite nicely.  The CA 27.29 is less than 9.  However, Briana [name] has a markedly elevated CEA of 293.  Again I think that the weight loss is somewhat troublesome.  I think Briana [name] is doing fairly well with pain..  Briana [name] is going to the  bathroom okay.  Briana [name] does not have any problems with constipation or diarrhea.  Concern however is that Briana [name]  Briana [name] states that Briana [name] has been well.   Briana [name] has been taking Briana [name] oxycodone  for Briana [name] cancer related pain. Briana [name] last took Briana [name] Oxycontin  at 4am. Briana [name] did not take Briana [name] break through dose of Oxycodone  10 mg this morning as Briana [name] wanted to see if Briana [name] could go without it. Unfortunately Briana [name] bone pain is significant Briana [name] in pelvis. XRT is aware and they have already completed an MR of this area- results pending. Briana [name] asks if Briana [name] could have a refill of this medication.   Briana [name] last CA 27.29 was 9.9 which is down from 20.7  Briana [name] really has had no problems with fatigue or weakness.  Briana [name] is eating well.  Briana [name] is having no problems with nausea or vomiting.  Briana [name] is having no issues with cough.  Briana [name] has had no change in bowel or bladder habits.  Briana [name] has had no leg swelling.  No fever.  Briana [name] has had no bleeding.  Has been no leg swelling.  Overall, I will say that Briana [name] performance status is probably ECOG 1.     Wt Readings from Last 3 Encounters:  06/23/24 140 lb (63.5 kg)  06/09/24 143 lb 9.6 oz (65.1 kg)  05/26/24 144 lb (65.3 kg)    Medications:  Current Outpatient Medications:  amLODipine  (NORVASC ) 10 MG tablet, TAKE 1 TABLET(10 MG) BY MOUTH DAILY, Disp: 90 tablet, Rfl: 0   BIKTARVY  50-200-25 MG TABS tablet, TAKE 1 TABLET DAILY, Disp: 30 tablet, Rfl: 4   dexamethasone  (DECADRON ) 4 MG tablet, Take 2 tablets (8 mg) by mouth daily for 3 days starting the day after chemotherapy. Take with food., Disp: 30 tablet, Rfl: 1   hydrOXYzine  (ATARAX ) 25 MG tablet, Take 1 tablet (25 mg total) by mouth every 8 (eight) hours as needed., Disp: 60 tablet, Rfl: 3   lidocaine -prilocaine  (EMLA ) cream, Apply to affected area once, Disp: 30 g, Rfl: 3   megestrol  (MEGACE  ES) 625 MG/5ML suspension, Take 5 mLs (625 mg total) by mouth daily., Disp: 150 mL, Rfl: 3   methocarbamol  (ROBAXIN ) 500 MG tablet, Take 1 tablet (500 mg total) by  mouth every 8 (eight) hours as needed for muscle spasms., Disp: 20 tablet, Rfl: 0   ondansetron  (ZOFRAN ) 8 MG tablet, Take 1 tablet (8 mg total) by mouth every 8 (eight) hours as needed for nausea or vomiting. Start on the third day after chemotherapy., Disp: 30 tablet, Rfl: 1   oxyCODONE  (OXYCONTIN ) 20 mg 12 hr tablet, Take 1 tablet (20 mg total) by mouth every 12 (twelve) hours., Disp: 60 tablet, Rfl: 0   Oxycodone  HCl 10 MG TABS, Take 1 tablet (10 mg total) by mouth every 6 (six) hours as needed., Disp: 60 tablet, Rfl: 0   polyethylene glycol (MIRALAX  / GLYCOLAX ) 17 g packet, Take 17 g by mouth daily as needed for moderate constipation., Disp: 14 each, Rfl: 0   potassium chloride  SA (KLOR-CON  M) 20 MEQ tablet, Take 1 tablet (20 mEq total) by mouth 2 (two) times daily., Disp: , Rfl:    prochlorperazine  (COMPAZINE ) 10 MG tablet, Take 1 tablet (10 mg total) by mouth every 6 (six) hours as needed for nausea or vomiting., Disp: 30 tablet, Rfl: 1   senna-docusate (SENOKOT-S) 8.6-50 MG tablet, Take 1 tablet by mouth 2 (two) times daily., Disp: 30 tablet, Rfl: 0   triamcinolone  (KENALOG ) 0.025 % ointment, Apply topically 2 (two) times daily as needed., Disp: 454 g, Rfl: 5 No current facility-administered medications for this visit.  Facility-Administered Medications Ordered in Other Visits:    0.9 %  sodium chloride  infusion, , Intravenous, Continuous, Franchot Lauraine HERO, NP, Stopped at 01/13/24 1502   dextrose  5 % solution, , Intravenous, Continuous, Declyn Offield, Maude SAUNDERS, MD, Last Rate: 400 mL/hr at 01/13/24 1652, Infusion Verify at 01/13/24 1652   sodium chloride  flush (NS) 0.9 % injection 10 mL, 10 mL, Intracatheter, PRN, Jameia Makris R, MD, 10 mL at 01/13/24 1648  Allergies:  Allergies  Allergen Reactions   Pumpkin Flavoring Agent (Non-Screening) Swelling    ONLY Lip swelling after eating pumpkin pie    Past Medical History, Surgical history, Social history, and Family History were reviewed and  updated.  Review of Systems: Review of Systems  Constitutional: Negative.   HENT:  Negative.    Eyes: Negative.   Respiratory: Negative.    Cardiovascular: Negative.   Gastrointestinal: Negative.   Endocrine: Negative.   Genitourinary: Negative.    Musculoskeletal: Negative.   Skin: Negative.   Neurological: Negative.   Hematological: Negative.   Psychiatric/Behavioral: Negative.      Physical Exam:   Vital signs show temperature of 98 1.  Pulse 120.  Blood pressure 124/86.  Weight is 135 pounds.   Physical Exam Vitals reviewed.  HENT:     Head: Normocephalic and atraumatic.  Eyes:     Pupils: Pupils are equal, round, and reactive to light.  Cardiovascular:     Rate and Rhythm: Normal rate and regular rhythm.     Heart sounds: Normal heart sounds.  Pulmonary:     Effort: Pulmonary effort is normal.     Breath sounds: Normal breath sounds.  Abdominal:     General: Bowel sounds are normal.     Palpations: Abdomen is soft.  Musculoskeletal:        General: No tenderness or deformity. Normal range of motion.     Cervical back: Normal range of motion.  Lymphadenopathy:     Cervical: No cervical adenopathy.  Skin:    General: Skin is warm and dry.     Findings: No erythema or rash.  Neurological:     Mental Status: Briana [name] is alert and oriented to person, place, and time.  Psychiatric:        Behavior: Behavior normal.        Thought Content: Thought content normal.        Judgment: Judgment normal.      Lab Results  Component Value Date   WBC 8.5 06/23/2024   HGB 10.7 (L) 06/23/2024   HCT 32.3 (L) 06/23/2024   MCV 88.0 06/23/2024   PLT 345 06/23/2024     Chemistry      Component Value Date/Time   NA 136 06/23/2024 0852   K 3.4 (L) 06/23/2024 0852   CL 100 06/23/2024 0852   CO2 20 (L) 06/23/2024 0852   BUN 5 (L) 06/23/2024 0852   CREATININE 0.82 06/23/2024 0852   CREATININE 0.62 10/17/2022 0947      Component Value Date/Time   CALCIUM  9.3 06/23/2024  0852   ALKPHOS 165 (H) 06/23/2024 0852   AST 22 06/23/2024 0852   ALT 9 06/23/2024 0852   BILITOT 0.5 06/23/2024 0852       Impression and Plan: Briana [name] is a very nice 48 year old premenopausal African American female with TRIPLE NEGATIVE breast cancer.  Briana [name] has aggressive disease in my opinion.  Briana [name] has already recurred. Briana [name] has recurred in Briana [name] bones and in Briana [name] lymph nodes.  We did get the lymph node biopsy.  Surprisingly, the cancer was CD30 positive.    At this point, I will have to make a change in treatment.  Of note, Briana [name] tumor is CD30 positive.  I will have to see if we might be able to utilize brentuximab.  I realize this is for lymphoma.  However, I would think that in my also be effective in Briana [name] situation.  I will have to look up some information about this.  If I do not think I can use an anti-CD30 approach, then I will have to see about utilizing chemotherapy along with anti-HER2 therapy.  Again, this is all about quality of life..  Briana [name] is going to have radiation therapy first.  I think this will be helpful.  Will have to follow Briana [name] up.  I probably will have to get Briana [name] back to see us  in about a month.  At that point, we will see about making an adjustment with Briana [name] protocol.   Maude JONELLE Crease, MD 11/20/20258:56 AM

## 2024-07-21 NOTE — Patient Instructions (Signed)
 Fam-Trastuzumab  Deruxtecan Injection What is this medication? FAM-TRASTUZUMAB  DERUXTECAN (fam-tras TOOZ eu mab DER ux TEE kan) treats some types of cancer. It works by blocking a protein that causes cancer cells to grow and multiply. This helps to slow or stop the spread of cancer cells. This medicine may be used for other purposes; ask your health care provider or pharmacist if you have questions. COMMON BRAND NAME(S): ENHERTU  What should I tell my care team before I take this medication? They need to know if you have any of these conditions: Heart disease Heart failure Infection, especially a viral infection, such as chickenpox, cold sores, or herpes Liver disease Lung or breathing disease, such as asthma or COPD An unusual or allergic reaction to fam-trastuzumab  deruxtecan, other medications, foods, dyes, or preservatives Pregnant or trying to get pregnant Breast-feeding How should I use this medication? This medication is injected into a vein. It is given by your care team in a hospital or clinic setting. A special MedGuide will be given to you before each treatment. Be sure to read this information carefully each time. Talk to your care team about the use of this medication in children. Special care may be needed. Overdosage: If you think you have taken too much of this medicine contact a poison control center or emergency room at once. NOTE: This medicine is only for you. Do not share this medicine with others. What if I miss a dose? It is important not to miss your dose. Call your care team if you are unable to keep an appointment. What may interact with this medication? Interactions are not expected. This list may not describe all possible interactions. Give your health care provider a list of all the medicines, herbs, non-prescription drugs, or dietary supplements you use. Also tell them if you smoke, drink alcohol , or use illegal drugs. Some items may interact with your  medicine. What should I watch for while using this medication? Visit your care team for regular checks on your progress. Tell your care team if your symptoms do not start to get better or if they get worse. This medication may increase your risk of getting an infection. Call your care team for advice if you get a fever, chills, sore throat, or other symptoms of a cold or flu. Do not treat yourself. Try to avoid being around people who are sick. Avoid taking medications that contain aspirin, acetaminophen , ibuprofen, naproxen, or ketoprofen unless instructed by your care team. These medications may hide a fever. Be careful brushing or flossing your teeth or using a toothpick because you may get an infection or bleed more easily. If you have any dental work done, tell your dentist you are receiving this medication. This medication may cause dry eyes and blurred vision. If you wear contact lenses, you may feel some discomfort. Lubricating eye drops may help. See your care team if the problem does not go away or is severe. Talk to your care team if you may be pregnant. Serious birth defects can occur if you take this medication during pregnancy and for 7 months after the last dose. If your partner can get pregnant, use a condom during sex while taking this medication and for 4 months after the last dose. Do not breastfeed while taking this medication and for 7 months after the last dose. This medication may cause infertility. Talk to your care team if you are concerned about your fertility. What side effects may I notice from receiving this medication? Side effects  that you should report to your care team as soon as possible: Allergic reactions--skin rash, itching, hives, swelling of the face, lips, tongue, or throat Dry cough, shortness of breath or trouble breathing Infection--fever, chills, cough, sore throat, wounds that don't heal, pain or trouble when passing urine, general feeling of discomfort or  being unwell Heart failure--shortness of breath, swelling of the ankles, feet, or hands, sudden weight gain, unusual weakness or fatigue Unusual bruising or bleeding Side effects that usually do not require medical attention (report these to your care team if they continue or are bothersome): Constipation Diarrhea Hair loss Muscle pain Nausea Vomiting This list may not describe all possible side effects. Call your doctor for medical advice about side effects. You may report side effects to FDA at 1-800-FDA-1088. Where should I keep my medication? This medication is given in a hospital or clinic. It will not be stored at home. NOTE: This sheet is a summary. It may not cover all possible information. If you have questions about this medicine, talk to your doctor, pharmacist, or health care provider.  2024 Elsevier/Gold Standard (2023-04-17 00:00:00)

## 2024-07-21 NOTE — Progress Notes (Signed)
 OK to treat with HR-122 per order of Dr. Timmy.  Order also received from Dr. Timmy to give pt 40 meq of PO Potassium now and 40 meq PO prior to discharge today.

## 2024-07-22 LAB — CANCER ANTIGEN 27.29: CA 27.29: <9 U/mL (ref 0.0–38.6)

## 2024-07-24 ENCOUNTER — Encounter: Payer: Self-pay | Admitting: Hematology & Oncology

## 2024-07-24 ENCOUNTER — Encounter: Payer: Self-pay | Admitting: Family

## 2024-07-25 NOTE — Progress Notes (Signed)
 Histology and Location of Primary Cancer: Stage IIIC (T4N3M0) - TRIPLE NEGATIVE - infiltrating ductal carcinoma of the left breast   Sites of Visceral and Bony Metastatic Disease:   clivus and in the left femur. She had persistent activity in the left seventh rib. She had new focal activity left axilla with some associated skin thickening.  Location(s) of Symptomatic Metastases:   Past/Anticipated chemotherapy by medical oncology, if any:    Pain on a scale of 0-10 is: {Number; 1-10  not applicable:20727}    If Spine Met(s), symptoms, if any, include: Bowel/Bladder retention or incontinence (please describe): *** Numbness or weakness in extremities (please describe): *** Current Decadron  regimen, if applicable: ***  Ambulatory status? Walker? Wheelchair?: {VQI Ambulatory Status:20974}  SAFETY ISSUES: Prior radiation? {:18581} Pacemaker/ICD? {:18581} Possible current pregnancy? {:18581} Is the patient on methotrexate? {:18581}  Current Complaints / other details:  ***

## 2024-07-26 ENCOUNTER — Ambulatory Visit: Admitting: Rehabilitation

## 2024-07-26 ENCOUNTER — Telehealth: Payer: Self-pay | Admitting: Rehabilitation

## 2024-07-26 NOTE — Progress Notes (Signed)
 Radiation Oncology         [phone] ________________________________  Initial Outpatient Re-Consultation  Name: Briana [name] MRN: [id number]  Date: 07/27/2024  DOB: 04-01-1976  RR:Azrx, Waddell NOVAK, NP  Timmy Maude JONELLE, MD   REFERRING PHYSICIAN: Timmy Maude JONELLE, MD  DIAGNOSIS: There were no encounter diagnoses. ***  Stage IIIC (T4N3M0) - TRIPLE NEGATIVE - invasive ductal carcinoma of the left breast S/p neoadjuvant chemoimmunotherapy and left breast mastectomy with left axillary TAD and adjuvant radiation : Mixed invasive ductal and invasive lobular carcinoma of the left breast, along with DCIS and LCIS - clear margins and negative nodes -- Invasive lobular carcinoma : ER +/ PR+ /  Her2- / Grade 2.  -- Invasive ductal carcinoma : ER+ / PR+ / Her2 - / Grade 3.    s/p palliative radiation to T10, right ribs, sacrum, and left pelvis completed on 01/21/2024  Now presenting with metastasis to the clivus, left femur, left axilla, and left seventh rib   HISTORY OF PRESENT ILLNESS::Briana [name] is a 48 y.o. female who is accompanied by ***. she is seen as a courtesy of Dr. Timmy for an opinion concerning radiation therapy as part of management for her metastatic carcinoma. The patient is known to me for her history of radiation for bone metastasis in several locations and was last seen in office on 06/09/24 for a follow up visit. She is currently on Enhert and is s/p 6 cycles with treatment initiated on 01/13/24 under the care of Dr. Timmy.   She underwent a Pelvis MRI on 06/11/24 showing an indeterminate small enhancing osseous foci measuring up to 9 x 7 mm in the superior posterior left iliac bone, possibly representing treated or early metastatic foci versus less likely post-radiation heterogeneity and an enlarged left inguinal lymph node measuring up to 12 mm in short axis could be reactive, however, metastatic involvement cannot be excluded. Scan also noted confluent appearing T1  hypo-isointense and T2 hyperintense marrow signal abnormality involving the left supraacetabular iliac bone as well as the left posterior and anterior acetabulum without discrete focal marrow-replacing lesion identified; these findings might reflect post-radiation marrow changes, radiation osteitis, or less likely insufficiency injury. Given that there was not role for radiation from these findings, patient was referred to PT for help with management of this pain.   Restaging PET scan performed on 07/14/24 showed new hypermetabolic activity in the right aspect of the clivus and anteriorly in the intertrochanteric region of the left femur, suspicious for metastatic disease; new focal cutaneous hypermetabolic activity in the left axilla with associated skin thickening, nonspecific; persistent hypermetabolic activity in the left 7th rib posterolaterally, consistent with metastatic disease; No evidence of local recurrence elsewhere in the left chest wall or metastatic disease in the neck, abdomen or pelvis was indicated on scan.   Patient met with Dr. Timmy on 07/21/24 to review the results of her PET scan. Given the findings detailed above, he suggested radiotherapy to the new identified lesions followed by a switch to brentuximab as her tumor is CD30 positive. If this approach is not possible, he will then utilize chemotherapy with anti-HER2 therapy.      Today, ***  PREVIOUS RADIATION THERAPY: Yes  Intent: Palliative  Radiation Treatment Dates:  First Treatment Date: 2024-01-07 -- Last Treatment Date: 2024-01-21 Site/Dose/Technique/Mode:  Plan Name: Spine_T10 Site: Thoracic Spine Technique: 3D Mode: Photon Dose Per Fraction: 3 Gy Prescribed Dose (Delivered / Prescribed): 30 Gy / 30 Gy Prescribed Fxs (Delivered /  Prescribed): 10 / 10   Plan Name: Chest_R Site: Ribs, Right Technique: 3D Mode: Photon Dose Per Fraction: 3 Gy Prescribed Dose (Delivered / Prescribed): 30 Gy / 30 Gy Prescribed  Fxs (Delivered / Prescribed): 10 / 10   Plan Name: Spine_Sacrum Site: Sacrum Technique: Isodose Plan Mode: Photon Dose Per Fraction: 3 Gy Prescribed Dose (Delivered / Prescribed): 30 Gy / 30 Gy Prescribed Fxs (Delivered / Prescribed): 10 / 10   Plan Name: Pelvis_L Site: Hip, Left Technique: Isodose Plan Mode: Photon Dose Per Fraction: 3 Gy Prescribed Dose (Delivered / Prescribed): 30 Gy / 30 Gy Prescribed Fxs (Delivered / Prescribed): 10 / 10 PAST MEDICAL HISTORY:  Past Medical History:  Diagnosis Date   Abnormal Pap smear    s/p colposcopy    Anemia    Breast cancer (HCC) 12/2022   Breast cancer metastasized to axillary lymph node, left (HCC) 02/12/2023   Chronic kidney disease 03/01/2013   kidney infection   History of radiation therapy    left chest wall-07/23/23-09/15/23- Dr. Lynwood Nasuti   History of radiation therapy    T-spine,Right chest,sacrum, left hip- 01/07/24-01/21/24- Dr. Lynwood Nasuti   HIV infection [facility])    Hypertension    Screening for malignant neoplasm of the cervix     PAST SURGICAL HISTORY: Past Surgical History:  Procedure Laterality Date   BREAST BIOPSY Left 01/20/2023   US  LT BREAST BX W LOC DEV 1ST LESION IMG BX SPEC US  GUIDE 01/20/2023 [facility]   BREAST BIOPSY Left 06/04/2023   US  LT RADIOACTIVE SEED LOC 06/04/2023 [facility]   BREAST RECONSTRUCTION WITH PLACEMENT OF TISSUE EXPANDER AND ALLODERM Left 06/08/2023   Procedure: LEFT BREAST RECONSTRUCTION WITH PLACEMENT OF TISSUE EXPANDER AND ALLODERM;  Surgeon: Arelia Filippo, MD;  Location: [facility];  Service: Plastics;  Laterality: Left;   CAPSULECTOMY Left 12/04/2023   Procedure: COMPLETE BREAST CAPSULECTOMY;  Surgeon: Arelia Filippo, MD;  Location: [facility];  Service: Plastics;  Laterality: Left;   CESAREAN SECTION     2000/2008   IR IMAGING GUIDED PORT INSERTION  02/06/2023   MASTECTOMY WITH AXILLARY LYMPH NODE DISSECTION Left 06/08/2023    Procedure: LEFT MASTECTOMY WITH TARGETED LYMPH NODE DISSECTION;  Surgeon: Vernetta Berg, MD;  Location: [facility];  Service: General;  Laterality: Left;  LMA PEC BLOCK   REMOVAL OF TISSUE EXPANDER Left 12/04/2023   Procedure: REMOVAL, TISSUE EXPANDER;  Surgeon: Arelia Filippo, MD;  Location: [facility];  Service: Plastics;  Laterality: Left;   TUBAL LIGATION      FAMILY HISTORY:  Family History  Problem Relation Age of Onset   Eczema Mother    Diabetes Father    Hypertension Father    Cancer Maternal Aunt    Diabetes Maternal Grandmother    Hypertension Maternal Grandmother    Diabetes Maternal Grandfather    Hypertension Maternal Grandfather    Diabetes Paternal Grandmother    Hypertension Paternal Grandmother    Diabetes Paternal Grandfather    Hypertension Paternal Grandfather     SOCIAL HISTORY:  Social History   Tobacco Use   Smoking status: Never   Smokeless tobacco: Never  Vaping Use   Vaping status: Never Used  Substance Use Topics   Alcohol use: No   Drug use: No    ALLERGIES:  Allergies  Allergen Reactions   Pumpkin Flavoring Agent (Non-Screening) Swelling    ONLY Lip swelling after eating pumpkin pie    MEDICATIONS:  Current Outpatient Medications  Medication Sig Dispense Refill   amLODipine  (NORVASC ) 10 MG tablet TAKE 1 TABLET(10 MG) BY MOUTH DAILY 90 tablet 0   BIKTARVY  50-200-25 MG TABS tablet TAKE 1 TABLET DAILY 30 tablet 4   dexamethasone  (DECADRON ) 4 MG tablet Take 2 tablets (8 mg) by mouth daily for 3 days starting the day after chemotherapy. Take with food. 30 tablet 1   hydrOXYzine  (ATARAX ) 25 MG tablet Take 1 tablet (25 mg total) by mouth every 8 (eight) hours as needed. 60 tablet 3   lidocaine -prilocaine  (EMLA ) cream Apply to affected area once 30 g 3   megestrol  (MEGACE  ES) 625 MG/5ML suspension Take 5 mLs (625 mg total) by mouth daily. 150 mL 3   methocarbamol  (ROBAXIN ) 500 MG tablet Take 1 tablet (500 mg  total) by mouth every 8 (eight) hours as needed for muscle spasms. 20 tablet 0   ondansetron  (ZOFRAN ) 8 MG tablet Take 1 tablet (8 mg total) by mouth every 8 (eight) hours as needed for nausea or vomiting. Start on the third day after chemotherapy. 30 tablet 1   oxyCODONE  (OXYCONTIN ) 20 mg 12 hr tablet Take 1 tablet (20 mg total) by mouth every 12 (twelve) hours. 60 tablet 0   Oxycodone  HCl 10 MG TABS Take 1 tablet (10 mg total) by mouth every 6 (six) hours as needed. 60 tablet 0   polyethylene glycol (MIRALAX  / GLYCOLAX ) 17 g packet Take 17 g by mouth daily as needed for moderate constipation. 14 each 0   potassium chloride  SA (KLOR-CON  M) 20 MEQ tablet Take 1 tablet (20 mEq total) by mouth 2 (two) times daily.     prochlorperazine  (COMPAZINE ) 10 MG tablet Take 1 tablet (10 mg total) by mouth every 6 (six) hours as needed for nausea or vomiting. 30 tablet 1   senna-docusate (SENOKOT-S) 8.6-50 MG tablet Take 1 tablet by mouth 2 (two) times daily. 30 tablet 0   triamcinolone  (KENALOG ) 0.025 % ointment Apply topically 2 (two) times daily as needed. 454 g 5   No current facility-administered medications for this visit.   Facility-Administered Medications Ordered in Other Visits  Medication Dose Route Frequency Provider Last Rate Last Admin   0.9 %  sodium chloride  infusion   Intravenous Continuous Franchot Lauraine HERO, NP   Stopped at 01/13/24 1502   dextrose  5 % solution   Intravenous Continuous Timmy Maude SAUNDERS, MD 400 mL/hr at 01/13/24 1652 Infusion Verify at 01/13/24 1652   sodium chloride  flush (NS) 0.9 % injection 10 mL  10 mL Intracatheter PRN Timmy Maude SAUNDERS, MD   10 mL at 01/13/24 1648    REVIEW OF SYSTEMS:  A 10+ POINT REVIEW OF SYSTEMS WAS OBTAINED including neurology, dermatology, psychiatry, cardiac, respiratory, lymph, extremities, GI, GU, musculoskeletal, constitutional, reproductive, HEENT. ***   PHYSICAL EXAM:  vitals were not taken for this visit.   General: Alert and oriented, in no  acute distress HEENT: Head is normocephalic. Extraocular movements are intact. Oropharynx is clear. Neck: Neck is supple, no palpable cervical or supraclavicular lymphadenopathy. Heart: Regular in rate and rhythm with no murmurs, rubs, or gallops. Chest: Clear to auscultation bilaterally, with no rhonchi, wheezes, or rales. Abdomen: Soft, nontender, nondistended, with no rigidity or guarding. Extremities: No cyanosis or edema. Lymphatics: see Neck Exam Skin: No concerning lesions. Musculoskeletal: symmetric strength and muscle tone throughout. Neurologic: Cranial nerves II through XII are grossly intact. No obvious focalities. Speech is fluent. Coordination is intact. Psychiatric: Judgment and insight are intact. Affect is appropriate. ***  ECOG = ***  0 - Asymptomatic (Fully active, able to carry on all predisease activities without restriction)  1 - Symptomatic but completely ambulatory (Restricted in physically strenuous activity but ambulatory and able to carry out work of a light or sedentary nature. For example, light housework, office work)  2 - Symptomatic, <50% in bed during the day (Ambulatory and capable of all self care but unable to carry out any work activities. Up and about more than 50% of waking hours)  3 - Symptomatic, >50% in bed, but not bedbound (Capable of only limited self-care, confined to bed or chair 50% or more of waking hours)  4 - Bedbound (Completely disabled. Cannot carry on any self-care. Totally confined to bed or chair)  5 - Death   Raylene MM, Creech RH, Tormey DC, et al. [phone]). Toxicity and response criteria of the [facility]. Am. DOROTHA Bridges. Oncol. 5 (6): 649-55  LABORATORY DATA:  Lab Results  Component Value Date   WBC 7.9 07/21/2024   HGB 10.4 (L) 07/21/2024   HCT 31.5 (L) 07/21/2024   MCV 86.8 07/21/2024   PLT 341 07/21/2024   NEUTROABS 5.8 07/21/2024   Lab Results  Component Value Date   NA 136 07/21/2024   K 3.2  (L) 07/21/2024   CL 102 07/21/2024   CO2 20 (L) 07/21/2024   GLUCOSE 114 (H) 07/21/2024   BUN 6 07/21/2024   CREATININE 0.82 07/21/2024   CALCIUM  8.7 (L) 07/21/2024      RADIOGRAPHY: NM PET Image Restag (PS) Skull Base To Thigh Result Date: 07/16/2024 CLINICAL DATA:  Subsequent treatment strategy for breast cancer. On immunotherapy. EXAM: NUCLEAR MEDICINE PET SKULL BASE TO THIGH TECHNIQUE: 7.31 mCi F-18 FDG was injected intravenously. Full-ring PET imaging was performed from the skull base to thigh after the radiotracer. CT data was obtained and used for attenuation correction and anatomic localization. Fasting blood glucose: 91 mg/dl COMPARISON:  PET-CT 91/78/7974. MR pelvis 06/11/2024 and chest CT 12/30/2023. FINDINGS: Mediastinal blood pool activity: SUV max 2.3 NECK: No hypermetabolic cervical lymph nodes are identified. No suspicious activity identified within the pharyngeal mucosal space. Incidental CT findings: none CHEST: There are no hypermetabolic mediastinal, hilar or axillary lymph nodes. No hypermetabolic pulmonary activity or suspicious nodularity. Incidental CT findings: Right IJ Port-A-Cath extends to the mid right atrium. New patchy airspace disease medially in the right lower lobe without associated hypermetabolic activity. No confluent airspace opacity or suspicious nodularity. ABDOMEN/PELVIS: There is no hypermetabolic activity within the liver, adrenal glands, spleen or pancreas. There is no hypermetabolic nodal activity in the abdomen or pelvis. Incidental CT findings: Stable apparent surgical clips lateral to the liver and within the left anterior abdomen. SKELETON: Small hypermetabolic lesion in the right aspect of the clivus (SUV max 6.7), suspicious for metastatic disease. Persistent band like hypermetabolic activity posterolaterally in the left 7th rib (SUV max 6.1) with associated sclerosis, consistent with metastatic disease. New hypermetabolic activity anteriorly in the  intertrochanteric region of the left femur, suspicious for metastatic disease. Status post left mastectomy. There is no chest wall hypermetabolic activity, although there is focal cutaneous hypermetabolic activity in the left axilla (SUV max 6.3). Apparent skin thickening in this area without well-defined soft tissue mass on the CT images. Incidental CT findings: Progressive loss of height at the T10 compression fracture, without associated hypermetabolic activity. IMPRESSION: 1. New hypermetabolic activity in the right aspect of the clivus and anteriorly in the intertrochanteric region of the left femur, suspicious for metastatic  disease. 2. Persistent hypermetabolic activity in the left 7th rib posterolaterally, consistent with metastatic disease. 3. New focal cutaneous hypermetabolic activity in the left axilla with associated skin thickening, nonspecific. Correlate with physical examination. 4. No evidence of local recurrence elsewhere in the left chest wall or metastatic disease in the neck, abdomen or pelvis. 5. Progressive loss of height at the T10 compression fracture, without associated hypermetabolic activity. Electronically Signed   By: Elsie Perone M.D.   On: 07/16/2024 19:24      IMPRESSION: Stage IIIC (T4N3M0) - TRIPLE NEGATIVE - invasive ductal carcinoma of the left breast, now presenting with metastasis to the clivus, left femur, left axilla, and left seventh rib  ***  Today, I talked to the patient and family about the findings and work-up thus far.  We discussed the natural history of *** and general treatment, highlighting the role of radiotherapy in the management.  We discussed the available radiation techniques, and focused on the details of logistics and delivery.  We reviewed the anticipated acute and late sequelae associated with radiation in this setting.  The patient was encouraged to ask questions that I answered to the best of my ability. *** A patient consent form was  discussed and signed.  We retained a copy for our records.  The patient would like to proceed with radiation and will be scheduled for CT simulation.  PLAN: ***    *** minutes of total time was spent for this patient encounter, including preparation, face-to-face counseling with the patient and coordination of care, physical exam, and documentation of the encounter.   ------------------------------------------------  Lynwood CHARM Nasuti, PhD, MD  This document serves as a record of services personally performed by Lynwood Nasuti, MD. It was created on his behalf by Reymundo Cartwright, a trained medical scribe. The creation of this record is based on the scribe's personal observations and the provider's statements to them. This document has been checked and approved by the attending provider.

## 2024-07-26 NOTE — Telephone Encounter (Signed)
 Called pt regarding missed PT appointment - left voice mail to discuss future appointments.

## 2024-07-27 ENCOUNTER — Ambulatory Visit
Admission: RE | Admit: 2024-07-27 | Discharge: 2024-07-27 | Disposition: A | Discharge status: Home / Self Care | Source: Ambulatory Visit | Attending: Radiation Oncology | Admitting: Radiation Oncology

## 2024-07-27 ENCOUNTER — Encounter: Payer: Self-pay | Admitting: Infectious Diseases

## 2024-07-27 ENCOUNTER — Encounter: Payer: Self-pay | Admitting: Radiation Oncology

## 2024-07-27 ENCOUNTER — Other Ambulatory Visit: Payer: Self-pay

## 2024-07-27 ENCOUNTER — Ambulatory Visit: Admitting: Infectious Diseases

## 2024-07-27 ENCOUNTER — Telehealth: Payer: Self-pay | Admitting: Hematology & Oncology

## 2024-07-27 ENCOUNTER — Telehealth: Payer: Self-pay | Admitting: *Deleted

## 2024-07-27 VITALS — BP 149/71 | HR 71 | Temp 96.9°F | Resp 18 | Ht 65.0 in | Wt 131.5 lb

## 2024-07-27 VITALS — BP 159/100 | HR 74 | Temp 97.6°F | Ht 65.0 in | Wt 134.0 lb

## 2024-07-27 DIAGNOSIS — Z1721 Progesterone receptor positive status: Secondary | ICD-10-CM | POA: Insufficient documentation

## 2024-07-27 DIAGNOSIS — Z1732 Human epidermal growth factor receptor 2 negative status: Secondary | ICD-10-CM | POA: Diagnosis not present

## 2024-07-27 DIAGNOSIS — C50912 Malignant neoplasm of unspecified site of left female breast: Secondary | ICD-10-CM | POA: Diagnosis not present

## 2024-07-27 DIAGNOSIS — Z923 Personal history of irradiation: Secondary | ICD-10-CM | POA: Insufficient documentation

## 2024-07-27 DIAGNOSIS — Z79624 Long term (current) use of inhibitors of nucleotide synthesis: Secondary | ICD-10-CM | POA: Insufficient documentation

## 2024-07-27 DIAGNOSIS — Z79899 Other long term (current) drug therapy: Secondary | ICD-10-CM | POA: Insufficient documentation

## 2024-07-27 DIAGNOSIS — B20 Human immunodeficiency virus [HIV] disease: Secondary | ICD-10-CM | POA: Insufficient documentation

## 2024-07-27 DIAGNOSIS — Z5181 Encounter for therapeutic drug level monitoring: Secondary | ICD-10-CM

## 2024-07-27 DIAGNOSIS — Z23 Encounter for immunization: Secondary | ICD-10-CM

## 2024-07-27 DIAGNOSIS — Z170 Estrogen receptor positive status [ER+]: Secondary | ICD-10-CM | POA: Diagnosis not present

## 2024-07-27 DIAGNOSIS — C773 Secondary and unspecified malignant neoplasm of axilla and upper limb lymph nodes: Secondary | ICD-10-CM | POA: Insufficient documentation

## 2024-07-27 DIAGNOSIS — C7951 Secondary malignant neoplasm of bone: Secondary | ICD-10-CM

## 2024-07-27 DIAGNOSIS — C50919 Malignant neoplasm of unspecified site of unspecified female breast: Secondary | ICD-10-CM

## 2024-07-27 DIAGNOSIS — Z9012 Acquired absence of left breast and nipple: Secondary | ICD-10-CM | POA: Insufficient documentation

## 2024-07-27 DIAGNOSIS — Z7952 Long term (current) use of systemic steroids: Secondary | ICD-10-CM | POA: Insufficient documentation

## 2024-07-27 DIAGNOSIS — C50412 Malignant neoplasm of upper-outer quadrant of left female breast: Secondary | ICD-10-CM | POA: Insufficient documentation

## 2024-07-27 DIAGNOSIS — N189 Chronic kidney disease, unspecified: Secondary | ICD-10-CM | POA: Insufficient documentation

## 2024-07-27 DIAGNOSIS — I129 Hypertensive chronic kidney disease with stage 1 through stage 4 chronic kidney disease, or unspecified chronic kidney disease: Secondary | ICD-10-CM | POA: Diagnosis not present

## 2024-07-27 DIAGNOSIS — Z113 Encounter for screening for infections with a predominantly sexual mode of transmission: Secondary | ICD-10-CM

## 2024-07-27 DIAGNOSIS — Z7185 Encounter for immunization safety counseling: Secondary | ICD-10-CM

## 2024-07-27 MED ORDER — BIKTARVY 50-200-25 MG PO TABS: 1.0000 | ORAL_TABLET | Freq: Every day | ORAL | 5 refills | Status: DC

## 2024-07-27 MED ORDER — ATORVASTATIN CALCIUM 10 MG PO TABS: 10.0000 mg | ORAL_TABLET | Freq: Every day | ORAL | 5 refills | Status: AC

## 2024-07-27 MED ORDER — BIKTARVY 50-200-25 MG PO TABS: 1.0000 | ORAL_TABLET | Freq: Every day | ORAL | 5 refills | Status: AC

## 2024-07-27 NOTE — Telephone Encounter (Signed)
 Called patient to inform of MRI for 07-29-24- arrival time- 10:45 am @ [facility], no restrictions to scan, spoke with patient and she is aware of this scan and the instructions

## 2024-07-27 NOTE — Progress Notes (Unsigned)
 [address], [place], [place], [address]                                                                  Phn. [phone]; Fax: [phone]                                                                             Date: 07/27/24  Reason for Visit: Routine HIV care.  HPI: Briana [name] is a 48 y.o.old female with a history of recurrent metastatic breast ca, follows Dr Timmy, HIV, CKD, HTN who is here for HIV fu.   11/26 Reports compliance with Biktarvy  with no missed doses or concerns.  She reports she worked over the night and was taking her hypertension medicine so her BP is elevated.  She has been following with oncology and getting chemotherapy once a month through her port. No plans for radiation.  Reports some thigh pain which improved with PT. Willing to do flu and menveo #2. Willing to start atorvastatin  10mg  po daily. Declined STD screening.  No other concerns.   ROS: As stated in above HPI; all other systems were reviewed and are otherwise negative unless noted below  No reported fever / chills, night sweats, acute visual change, odynophagia, chest pain/pressure, new or worsened SOB or WOB, nausea, vomiting, diarrhea, dysuria, GU discharge, syncope, seizures, red/hot swollen joints, hallucinations / delusions, rashes, new allergies, unusual / excessive bleeding, swollen lymph nodes  PMH/ PSH/ FamHx / Social Hx , medications and allergies reviewed and updated as appropriate; please see corresponding tab in EHR / prior notes                                        Current Outpatient Medications on File Prior to Visit  Medication Sig Dispense Refill   amLODipine  (NORVASC ) 10 MG tablet TAKE 1 TABLET(10 MG) BY MOUTH DAILY 90 tablet 0   BIKTARVY  50-200-25 MG TABS tablet TAKE 1 TABLET  DAILY 30 tablet 4   dexamethasone  (DECADRON ) 4 MG tablet Take 2 tablets (8 mg) by mouth daily for 3 days starting the day after chemotherapy. Take with food. 30 tablet 1   hydrOXYzine  (ATARAX ) 25 MG tablet Take 1 tablet (25 mg total) by mouth every 8 (eight) hours as needed. 60 tablet 3   lidocaine -prilocaine  (EMLA ) cream Apply to affected area once 30 g 3   megestrol  (MEGACE  ES) 625 MG/5ML suspension Take 5 mLs (625 mg total) by mouth daily. 150 mL 3   methocarbamol  (ROBAXIN ) 500 MG tablet Take  1 tablet (500 mg total) by mouth every 8 (eight) hours as needed for muscle spasms. 20 tablet 0   ondansetron  (ZOFRAN ) 8 MG tablet Take 1 tablet (8 mg total) by mouth every 8 (eight) hours as needed for nausea or vomiting. Start on the third day after chemotherapy. 30 tablet 1   oxyCODONE  (OXYCONTIN ) 20 mg 12 hr tablet Take 1 tablet (20 mg total) by mouth every 12 (twelve) hours. 60 tablet 0   Oxycodone  HCl 10 MG TABS Take 1 tablet (10 mg total) by mouth every 6 (six) hours as needed. 60 tablet 0   polyethylene glycol (MIRALAX  / GLYCOLAX ) 17 g packet Take 17 g by mouth daily as needed for moderate constipation. 14 each 0   potassium chloride  SA (KLOR-CON  M) 20 MEQ tablet Take 1 tablet (20 mEq total) by mouth 2 (two) times daily.     prochlorperazine  (COMPAZINE ) 10 MG tablet Take 1 tablet (10 mg total) by mouth every 6 (six) hours as needed for nausea or vomiting. 30 tablet 1   senna-docusate (SENOKOT-S) 8.6-50 MG tablet Take 1 tablet by mouth 2 (two) times daily. 30 tablet 0   triamcinolone  (KENALOG ) 0.025 % ointment Apply topically 2 (two) times daily as needed. 454 g 5   Current Facility-Administered Medications on File Prior to Visit  Medication Dose Route Frequency Provider Last Rate Last Admin   0.9 %  sodium chloride  infusion   Intravenous Continuous Franchot Lauraine HERO, NP   Stopped at 01/13/24 1502   dextrose  5 % solution   Intravenous Continuous Timmy Maude SAUNDERS, MD 400 mL/hr at 01/13/24 1652 Infusion  Verify at 01/13/24 1652   sodium chloride  flush (NS) 0.9 % injection 10 mL  10 mL Intracatheter PRN Timmy Maude SAUNDERS, MD   10 mL at 01/13/24 1648    Allergies  Allergen Reactions   Pumpkin Flavoring Agent (Non-Screening) Swelling    ONLY Lip swelling after eating pumpkin pie   Past Medical History:  Diagnosis Date   Abnormal Pap smear    s/p colposcopy    Anemia    Breast cancer (HCC) 12/2022   Breast cancer metastasized to axillary lymph node, left (HCC) 02/12/2023   Chronic kidney disease 03/01/2013   kidney infection   History of radiation therapy    left chest wall-07/23/23-09/15/23- Dr. Lynwood Nasuti   History of radiation therapy    T-spine,Right chest,sacrum, left hip- 01/07/24-01/21/24- Dr. Lynwood Nasuti   HIV infection [facility])    Hypertension    Screening for malignant neoplasm of the cervix     Past Surgical History:  Procedure Laterality Date   BREAST BIOPSY Left 01/20/2023   US  LT BREAST BX W LOC DEV 1ST LESION IMG BX SPEC US  GUIDE 01/20/2023 [facility]   BREAST BIOPSY Left 06/04/2023   US  LT RADIOACTIVE SEED LOC 06/04/2023 [facility]   BREAST RECONSTRUCTION WITH PLACEMENT OF TISSUE EXPANDER AND ALLODERM Left 06/08/2023   Procedure: LEFT BREAST RECONSTRUCTION WITH PLACEMENT OF TISSUE EXPANDER AND ALLODERM;  Surgeon: Arelia Filippo, MD;  Location: [facility];  Service: Plastics;  Laterality: Left;   CAPSULECTOMY Left 12/04/2023   Procedure: COMPLETE BREAST CAPSULECTOMY;  Surgeon: Arelia Filippo, MD;  Location: [facility];  Service: Plastics;  Laterality: Left;   CESAREAN SECTION     2000/2008   IR IMAGING GUIDED PORT INSERTION  02/06/2023   MASTECTOMY WITH AXILLARY LYMPH NODE DISSECTION Left 06/08/2023   Procedure: LEFT MASTECTOMY WITH TARGETED LYMPH NODE DISSECTION;  Surgeon: Vernetta Berg,  MD;  Location: [facility];  Service: General;  Laterality: Left;  LMA PEC BLOCK   REMOVAL OF TISSUE EXPANDER Left  12/04/2023   Procedure: REMOVAL, TISSUE EXPANDER;  Surgeon: Arelia Filippo, MD;  Location: [facility];  Service: Plastics;  Laterality: Left;   TUBAL LIGATION     Social History   Socioeconomic History   Marital status: Single    Spouse name: Not on file   Number of children: Not on file   Years of education: Not on file   Highest education level: Not on file  Occupational History   Not on file  Tobacco Use   Smoking status: Never   Smokeless tobacco: Never  Vaping Use   Vaping status: Never Used  Substance and Sexual Activity   Alcohol use: No   Drug use: No   Sexual activity: Not Currently    Partners: Male    Comment: DECLINED CONDOMS  Other Topics Concern   Not on file  Social History Narrative   Not on file   Social Drivers of Health   Financial Resource Strain: Not on file  Food Insecurity: No Food Insecurity (07/27/2024)   Hunger Vital Sign    Worried About Running Out of Food in the Last Year: Never true    Ran Out of Food in the Last Year: Never true  Transportation Needs: No Transportation Needs (07/27/2024)   PRAPARE - Administrator, Civil Service (Medical): No    Lack of Transportation (Non-Medical): No  Physical Activity: Not on file  Stress: Not on file  Social Connections: Not on file  Intimate Partner Violence: Not At Risk (07/27/2024)   Humiliation, Afraid, Rape, and Kick questionnaire    Fear of Current or Ex-Partner: No    Emotionally Abused: No    Physically Abused: No    Sexually Abused: No   Family History  Problem Relation Age of Onset   Eczema Mother    Diabetes Father    Hypertension Father    Cancer Maternal Aunt    Diabetes Maternal Grandmother    Hypertension Maternal Grandmother    Diabetes Maternal Grandfather    Hypertension Maternal Grandfather    Diabetes Paternal Grandmother    Hypertension Paternal Grandmother    Diabetes Paternal Grandfather    Hypertension Paternal Grandfather      Vitals There were no vitals taken for this visit.   Examination  Gen: no acute distress HEENT: [place]/AT, no scleral icterus, no pale conjunctivae, hearing normal, oral mucosa moist Neck: Supple Cardio: Regular rate and rhythm, S1S2 Resp: Pulmonary effort normal in room air, Normal breath sounds  GI: nondistended, soft and non tender  GU: Musc: Extremities: No pedal edema Skin: No rashes Neuro: grossly non focal , awake, alert and oriented * 3  Psych: Calm, cooperative  Lab Results HIV 1 RNA Quant  Date Value  12/28/2023 CANCELED  12/28/2023 27 copies/mL (H)  06/23/2023 <20 Copies/mL (H)   CD4 T Cell Abs (/uL)  Date Value  06/23/2023 512  04/29/2022 649  04/30/2021 423   Lab Results  Component Value Date   HIV1GENOSEQ REPORT 11/25/2016   Lab Results  Component Value Date   WBC 7.9 07/21/2024   HGB 10.4 (L) 07/21/2024   HCT 31.5 (L) 07/21/2024   MCV 86.8 07/21/2024   PLT 341 07/21/2024    Lab Results  Component Value Date   CREATININE 0.82 07/21/2024   BUN 6 07/21/2024   NA 136 07/21/2024  K 3.2 (L) 07/21/2024   CL 102 07/21/2024   CO2 20 (L) 07/21/2024   Lab Results  Component Value Date   ALT <5 07/21/2024   AST 26 07/21/2024   ALKPHOS 201 (H) 07/21/2024   BILITOT 0.5 07/21/2024    Lab Results  Component Value Date   CHOL 175 12/28/2023   TRIG 95 12/28/2023   HDL 40 (L) 12/28/2023   LDLCALC 115 (H) 12/28/2023   No results found for: HAV Lab Results  Component Value Date   HEPBSAG NON REACTIVE 08/24/2021   Lab Results  Component Value Date   HCVAB NON REACTIVE 08/24/2021   Lab Results  Component Value Date   CHLAMYDIAWP Negative 10/17/2022   N Positive (A) 10/17/2022   Lab Results  Component Value Date   GCPROBEAPT NEGATIVE 03/09/2013   No results found for: QUANTGOLD   Health Maintenance: Immunization History  Administered Date(s) Administered   Hepatitis A, Adult 10/08/2021   Hepatitis B 01/23/2011, 03/06/2011,  12/15/2011   Hepb-cpg 10/08/2021   Influenza Whole 06/01/2006, 04/17/2011   Influenza, Seasonal, Injecte, Preservative Fre 07/07/2023   Influenza,inj,Quad PF,6+ Mos 08/18/2013, 05/23/2014, 05/23/2019, 10/03/2020   Meningococcal Mcv4o 01/04/2024   PNEUMOCOCCAL CONJUGATE-20 10/08/2021   PPD Test 07/18/2014   Pfizer Covid-19 Vaccine Bivalent Booster 53yrs & up 10/08/2021   Pneumococcal Conjugate-13 06/23/2019   Pneumococcal Polysaccharide-23 01/23/2011, 09/13/2019   Tdap 10/12/2017    Assessment/Plan: # HIV - Adherence assessed, side effects reviewed/discussed and DDIs reviewed  - Continue Biktarvy , need to space out with Mvs> Meds refilled  - discussed labs  - Fu in 5-6 months    # Left breast ca with mets to the bone - s/p neoadjuvant chemoimmunotherapy, s/p Left MRM with residual cancer followed by XRT, Xeloda   - On Zometa  4 mg every 3 months, next dose 03/2024 - Follows Radiation Oncology  # Immunization  - Menveo # 1 today  #Health maintenance - dental care discussed - Lipid panel reviewed. ASCVD risk score per 2013 AHA/ACC risk calculator <5% (3.7%). Will discuss with patient for starting statin. - discussed to fu with PCP for cervical ca and colon ca screening. Last pap smear 02/05/23 [facility]. Last seen 12/28/23  Patient's labs were reviewed as well as his previous records. Patients questions were addressed and answered. Safe sex counseling done.   I have personally spent 41 minutes involved in face-to-face and non-face-to-face activities for this patient on the day of the visit. Professional time spent includes the following activities: Preparing to see the patient (review of tests), Obtaining and/or reviewing separately obtained history (admission/discharge record), Performing a medically appropriate examination and/or evaluation , Ordering medications/tests/procedures, referring and communicating with other health care professionals, Documenting clinical information in the EMR,  Independently interpreting results (not separately reported), Communicating results to the patient/family/caregiver, Counseling and educating the patient/family/caregiver and Care coordination (not separately reported).   Of note, portions of this note may have been created with voice recognition software. While this note has been edited for accuracy, occasional wrong-word or 'sound-a-like' substitutions may have occurred due to the inherent limitations of voice recognition software.   Electronically signed by:  Annalee Orem, MD Infectious Disease Physician [facility] [address] [place], [place] [address] Phone: [phone]  Fax: [phone]

## 2024-07-27 NOTE — Telephone Encounter (Signed)
 Called to schedule infusion per inbasket. LVM to return call for scheduling.

## 2024-07-28 ENCOUNTER — Other Ambulatory Visit: Payer: Self-pay

## 2024-07-28 DIAGNOSIS — Z23 Encounter for immunization: Secondary | ICD-10-CM | POA: Insufficient documentation

## 2024-07-29 ENCOUNTER — Ambulatory Visit (HOSPITAL_COMMUNITY)
Admission: RE | Admit: 2024-07-29 | Discharge: 2024-07-29 | Disposition: A | Discharge status: Home / Self Care | Source: Ambulatory Visit | Attending: Radiology | Admitting: Radiology

## 2024-07-29 DIAGNOSIS — R9089 Other abnormal findings on diagnostic imaging of central nervous system: Secondary | ICD-10-CM | POA: Diagnosis not present

## 2024-07-29 DIAGNOSIS — C50919 Malignant neoplasm of unspecified site of unspecified female breast: Secondary | ICD-10-CM | POA: Diagnosis present

## 2024-07-29 DIAGNOSIS — C7951 Secondary malignant neoplasm of bone: Secondary | ICD-10-CM | POA: Diagnosis not present

## 2024-07-29 MED ORDER — GADOBUTROL 1 MMOL/ML IV SOLN
6.5000 mL | Freq: Once | INTRAVENOUS | Status: AC | PRN
  Administered 2024-07-29: 6.5 mL via INTRAVENOUS

## 2024-07-29 MED ORDER — HEPARIN SOD (PORK) LOCK FLUSH 100 UNIT/ML IV SOLN
500.0000 [IU] | INTRAVENOUS | Status: AC | PRN
  Administered 2024-07-29: 500 [IU]
  Filled 2024-07-29: qty 5

## 2024-07-31 LAB — HIV RNA, RTPCR W/R GT (RTI, PI,INT)
HIV 1 RNA Quant: NOT DETECTED {copies}/mL
HIV-1 RNA Quant, Log: NOT DETECTED {Log_copies}/mL

## 2024-08-02 ENCOUNTER — Encounter: Payer: Self-pay | Admitting: Rehabilitation

## 2024-08-02 ENCOUNTER — Ambulatory Visit: Attending: Radiation Oncology | Admitting: Rehabilitation

## 2024-08-02 ENCOUNTER — Ambulatory Visit: Admitting: Radiation Oncology

## 2024-08-02 DIAGNOSIS — M25551 Pain in right hip: Secondary | ICD-10-CM | POA: Diagnosis present

## 2024-08-02 DIAGNOSIS — M25552 Pain in left hip: Secondary | ICD-10-CM | POA: Insufficient documentation

## 2024-08-02 DIAGNOSIS — C50912 Malignant neoplasm of unspecified site of left female breast: Secondary | ICD-10-CM | POA: Diagnosis present

## 2024-08-02 DIAGNOSIS — Z171 Estrogen receptor negative status [ER-]: Secondary | ICD-10-CM | POA: Diagnosis present

## 2024-08-02 DIAGNOSIS — Z483 Aftercare following surgery for neoplasm: Secondary | ICD-10-CM | POA: Diagnosis present

## 2024-08-02 NOTE — Therapy (Signed)
 OUTPATIENT PHYSICAL THERAPY ONCOLOGY TREATMENT  Patient Name: Briana [name] MRN: [id number] DOB:[birth year], 48 y.o., female Today's Date: 08/02/2024  END OF SESSION:  PT End of Session - 08/02/24 0900     Visit Number 3    Number of Visits 17    Date for Recertification  09/08/24    Authorization Type none    PT Start Time 0900    PT Stop Time 0955    PT Time Calculation (min) 55 min    Activity Tolerance Patient tolerated treatment well    Behavior During Therapy [facility] for tasks assessed/performed           Past Medical History:  Diagnosis Date   Abnormal Pap smear    s/p colposcopy    Anemia    Breast cancer (HCC) 12/2022   Breast cancer metastasized to axillary lymph node, left (HCC) 02/12/2023   Chronic kidney disease 03/01/2013   kidney infection   History of radiation therapy    left chest wall-07/23/23-09/15/23- Dr. Lynwood Nasuti   History of radiation therapy    T-spine,Right chest,sacrum, left hip- 01/07/24-01/21/24- Dr. Lynwood Nasuti   HIV infection [facility])    Hypertension    Screening for malignant neoplasm of the cervix    Past Surgical History:  Procedure Laterality Date   BREAST BIOPSY Left 01/20/2023   US  LT BREAST BX W LOC DEV 1ST LESION IMG BX SPEC US  GUIDE 01/20/2023 [facility]   BREAST BIOPSY Left 06/04/2023   US  LT RADIOACTIVE SEED LOC 06/04/2023 [facility]   BREAST RECONSTRUCTION WITH PLACEMENT OF TISSUE EXPANDER AND ALLODERM Left 06/08/2023   Procedure: LEFT BREAST RECONSTRUCTION WITH PLACEMENT OF TISSUE EXPANDER AND ALLODERM;  Surgeon: Arelia Filippo, MD;  Location: [facility];  Service: Plastics;  Laterality: Left;   CAPSULECTOMY Left 12/04/2023   Procedure: COMPLETE BREAST CAPSULECTOMY;  Surgeon: Arelia Filippo, MD;  Location: [facility];  Service: Plastics;  Laterality: Left;   CESAREAN SECTION     2000/2008   IR IMAGING GUIDED PORT INSERTION  02/06/2023   MASTECTOMY WITH AXILLARY LYMPH NODE  DISSECTION Left 06/08/2023   Procedure: LEFT MASTECTOMY WITH TARGETED LYMPH NODE DISSECTION;  Surgeon: Vernetta Berg, MD;  Location: [facility];  Service: General;  Laterality: Left;  LMA PEC BLOCK   REMOVAL OF TISSUE EXPANDER Left 12/04/2023   Procedure: REMOVAL, TISSUE EXPANDER;  Surgeon: Arelia Filippo, MD;  Location: [facility];  Service: Plastics;  Laterality: Left;   TUBAL LIGATION     Patient Active Problem List   Diagnosis Date Noted   Need for influenza vaccination 07/28/2024   Immunization counseling 07/27/2024   Malignant neoplasm of upper outer quadrant of female breast (HCC) 06/09/2024   Need for meningitis vaccination 01/05/2024   Encounter to discuss test results 01/05/2024   HIV disease (HCC) 01/04/2024   Medication monitoring encounter 01/04/2024   Health care maintenance 01/04/2024   Lytic bone lesions on xray 12/18/2023   Moderate protein malnutrition 12/18/2023   Hyponatremia 12/18/2023   Need for prophylactic vaccination and inoculation against influenza 07/07/2023   Stage IV breast cancer in female [facility]) 06/08/2023   Breast cancer metastasized to axillary lymph node, left (HCC) 02/12/2023   Breast lump in female 01/08/2023   HTN (hypertension) 04/29/2022   Routine screening for STI (sexually transmitted infection) 10/03/2020   History of long-term treatment with high-risk medication 10/03/2020   Impetigo 10/03/2020   Screening for cervical cancer 05/23/2019   Heavy menstrual bleeding 05/23/2019  Thrombocytosis 11/26/2016   Eczema 09/11/2011   History of abnormal cervical Pap smear 01/24/2011   Iron  deficiency anemia 01/23/2011   Human immunodeficiency virus (HIV) disease (HCC) 10/19/2006    REFERRING PROVIDER: Dr. Shannon  REFERRING DIAG: Stage IV Metastatic Breast Cancer to Bone  THERAPY DIAG:  Malignant neoplasm of left breast in female, estrogen receptor negative, unspecified site of breast [facility])  Aftercare  following surgery for neoplasm  Pain of both hip joints  ONSET DATE: 06/2023  Rationale for Evaluation and Treatment: Rehabilitation  SUBJECTIVE:                                                                                                                                                                                           SUBECTIVE STATEMENT: Patient reports she is no longer having pain in her hip but has stiffness after standing up. She does not need to undergo radiation due to no longer having pain. Her hip has been popping but typically feels good afterwards. Patient has been compliant with HEP. She is now able to sleep on her sides at night.   PERTINENT HISTORY:  Pt had neoadjuvant chemotherapy for her triple negative breast cancer, followed by left Mastectomy with immediate reconstruction with TE on 06/08/2023, and radiation which ended in January 2025. She developed swelling in the left breast and was placed on Bactim on 11/12/2023 by Rad/Onc. Saw Dr. Arelia on 11/19/23 who saw no signs of infection, but recommended compression bra for swelling. She has a tissue expander in place.  12/04/2023 Removal of left chest tissue expander due to pain, seroma, mechanical complication of expander. Pt is now Stage IV Metastatic Breast Cancer now s/p palliative radiation to the Lumbar spine and Sacrum. Current Therapy:        Neoadjuvant  chemoimmunotherapy --- carboplatin /Taxol /pembrolizumab  ---s/p cycle #3 - start on 02/19/2023 S/p LEFT MRM - 06/08/2023 --  Residual breast cancer - 3/3 nodes (-) XRT -- Start on 07/22/2023 --completed on 09/15/2023 Xeloda  - s/p cycle #3 on 09/28/2023 Zometa  4 mg IV every 3 months-next dose to be given on 06/2024 Enhertu  5.4 mg/m IV every 3 weeks-s/p cycle #6  -- start on 01/13/2024 Radiotherapy to multiple sites-completed on 01/21/2024 IV iron -Venofer  given on 01/13/2024 Recent imaging showing bil mild to moderate hip joint effusions with indications of  possible osteitis or osteonecrosis  PAIN:  Are you having pain? No  PRECAUTIONS: Stage IV Metastatic left Breast Cancer, Hx of bone mets in T10, right ribs, sacrum, and left pelvis, HIV+  WEIGHT BEARING RESTRICTIONS: No  FALLS:  Has patient fallen in last 6 months? No  LIVING ENVIRONMENT: Lives with:  lives with their family and lives with their daughter Has following equipment at home: None  OCCUPATION: [employer]  LEISURE: not now    HAND DOMINANCE: right   PRIOR LEVEL OF FUNCTION: Independent with basic ADLs  PATIENT GOALS: decrease the pain.     OBJECTIVE: Note: Objective measures were completed at Evaluation unless otherwise noted.  COGNITION: Overall cognitive status: Within functional limits for tasks assessed   PALPATION: No ttp  OBSERVATIONS / OTHER ASSESSMENTS: walks in with antalgic gait with almost a trendelenberg pattern for the left hip  SENSATION: no CIPN or neuropathy    POSTURE: WNL in standing  Increased pain with SL stance on the left not on the Rt Stiffness into a squat at around 50% motion available  Heel and toe raise WNL  UPPER EXTREMITY AROM/PROM: WNL  CERVICAL AROM: All within normal limits:   UPPER EXTREMITY STRENGTH: WNL per pt  LOWER EXTREMITY AROM/PROM:  A/PROM Right eval  Hip flexion Passive to 95 without pain change  Hip extension   Hip abduction   Hip adduction   Hip internal rotation 5deg in supine hooklying  Hip external rotation 35deg in supine hooklying  Knee flexion WNL in prone - no quad tightness  Knee extension full  Ankle dorsiflexion   Ankle plantarflexion   Ankle inversion   Ankle eversion    (Blank rows = not tested)  A/PROM LEFT eval  Hip flexion Passive to 110 with pain  Hip extension   Hip abduction   Hip adduction   Hip internal rotation 12deg in supine hooklying  Hip external rotation 35deg in supine hooklying  Knee flexion WNL in prone   Knee extension full  Ankle dorsiflexion   Ankle  plantarflexion   Ankle inversion   Ankle eversion    (Blank rows = not tested)  LOWER EXTREMITY MMT: not performed due to METs  LYMPHEDEMA ASSESSMENTS:   SURGERY TYPE/DATE: left Mastectomy with immediate reconstruction with TE on 06/08/2023,for Triple neg  12/04/2023 Tissue expander removed due to pain, swelling  NUMBER OF LYMPH NODES REMOVED: 1+/3  CHEMOTHERAPY: Yes, Neoadjuvant  RADIATION:YES  HORMONE TREATMENT: NO  INFECTIONS: YES   FUNCTIONAL TESTS:  Reports balance is normal  See above for other tests  GAIT: Distance walked: in clinic Assistive device utilized: Single point cane and Walker - 2 wheeled Comments: did not find much change in pain with AD added but did have less of a trendelenburg gait  LEFS: PATIENT SURVEYS: LEFS  Extreme difficulty/unable (0), Quite a bit of difficulty (1), Moderate difficulty (2), Little difficulty (3), No difficulty (4) Survey date:  Eval 08/02/24  Any of your usual work, housework or school activities 2 4  2. Usual hobbies, recreational or sporting activities 2 3  3. Getting into/out of the bath 2 4  4. Walking between rooms 2 4  5. Putting on socks/shoes 2 4  6. Squatting  1 3  7. Lifting an object, like a bag of groceries from the floor 3 4  8. Performing light activities around your home 3 4  9. Performing heavy activities around your home 1 4  10. Getting into/out of a car 2 4  11. Walking 2 blocks 1 3  12. Walking 1 mile 1 3  13. Going up/down 10 stairs (1 flight) 2 3  14. Standing for 1 hour 2 4  15.  sitting for 1 hour 2 3  16. Running on even ground 0 3  17. Running on uneven ground 0  3  18. Making sharp turns while running fast 1 3  19. Hopping  0 4  20. Rolling over in bed 2 4  Score total:  31                                                                                                                    TREATMENT DATE:  08/02/2024 SKTC 5 x 10 sec hold  Supine figure 4 stretch 3 x 20 sec ea, more limited on  left but improved bilaterally with each stretch Bridge x 10, 3 sec hold Supine bent knee fall out alternating 2 x 5 ea PROM bilateral Hamstrings 3 x 30 in supine with opp knee bent Heel raises standing in // bars with UE support 2 x 10 - not painful but pt reports exercise feeling weird on L hip Marching x 10 alternating with UE support Standing 3 D hip (flex, abd, ext) x 10 bil Incline stretch x 3 30 sec Updated HEP to include seated hamstring stretch & figure 4 stretch so she can perform these at work Discussed progress & attending aquatic only therapy    07/20/2024 Discussed using cane to normalize gait even if it doesn't help her pain. It prevents compensation, and aggravating other areas of body Discussed doing body wt exercises only and not forcing through pain, no resistance. Gentle stretch not pain SKTC x 5 alternating Supine figure 4 stretch x 3 20 sec ea, more limited on left but improved bilaterally with each stretch. Bridge x 10, 3 sec hold Supine bent knee fall out alternating x 5 ea PROM bilateral Hamstrings 3 x 30 in supine with opp knee bent. Standing in // bars with HH, heel raises 2 x 10 Marching x 10 alternating with HH Standing 2 D hip  x 10 with B. HH, flex and abd Incline stretch x 3 30 sec Updated HEP in [facility]  07/14/24 Eval performed Instructed pt in initial HEP per below - no pain with any of the exercises Edu on pool and POC as well as AD PT sent a note to [facility] regarding possible osteo- findings from the MRI.   PATIENT EDUCATION:  Access Code: [email or web address] URL: [email or web address] Date: 07/20/2024 Prepared by: Grayce Sheldon  Exercises - Supine Single Knee to Chest Stretch  - 1 x daily - 7 x weekly - 1 sets - 3 reps - 15-20 hold - Supine Hip External Rotation Stretch  - 1 x daily - 7 x weekly - 1 sets - 3 reps - 15-20 hold - Bent Knee Fallouts  - 1 x daily - 7 x weekly - 1 sets - 5 reps - 5 hold - Standing Hip Abduction with Counter  Support  - 1 x daily - 7 x weekly - 1 sets - 10 reps - Standing March with Counter Support  - 1 x daily - 7 x weekly - 1 sets - 10 reps - Standing Heel Raise with Support  - 1 x daily - 7 x weekly - 2  sets - 10 reps  Exercises - Long Sitting Quad Set  - 1 x daily - 7 x weekly - 1-3 sets - 10 reps - 5-6sec hold - Supine Bridge  - 1 x daily - 7 x weekly - 1-3 sets - 10 reps - 2-3 seconds hold - Clamshell at Wall  - 1 x daily - 7 x weekly - 1-3 sets - 10 reps - 20-30 seconds hold - Sidelying Reverse Clamshell  - 1 x daily - 7 x weekly - 1-3 sets - 10 reps - 20-30 seconds hold  Exercises - Seated Active Figure 4 Hip Flexion and External Rotation  - 1 x daily - 7 x weekly - 1 sets - 3 reps - 20-30 hold - Seated Hamstring Stretch  - 1 x daily - 7 x weekly - 1 sets - 3 reps - 20-30 hold  ASSESSMENT:  CLINICAL IMPRESSION: Patient tolerates the progression of sets with supine BKFO well with no increases in pain or discomfort. Seated figure 4 and hamstring are added to her HEP so the patient can perform these stretches at work. Patient reports an improvement in pain but still has stiffness, especially after sitting for longer periods of time. Patient has an improved LEFS score however, still has mild difficulty with most tasks listed. Patient would benefit from aquatic therapy for LE strengthening, using the water for increased resistance without the use of weights.   OBJECTIVE IMPAIRMENTS: Abnormal gait, decreased activity tolerance, decreased knowledge of condition, decreased knowledge of use of DME, decreased mobility, decreased ROM, and pain.   ACTIVITY LIMITATIONS: sitting, standing, squatting, sleeping, and transfers  PARTICIPATION LIMITATIONS: meal prep, cleaning, and community activity  PERSONAL FACTORS: 1-2 comorbidities: disease status, radiation hx are also affecting patient's functional outcome.   REHAB POTENTIAL: Good  CLINICAL DECISION MAKING: Evolving/moderate  complexity  EVALUATION COMPLEXITY: Moderate  GOALS: Goals reviewed with patient? Yes  SHORT TERM GOALS: Target date: 08/08/24  Pt will be educated on the use of AD for consideration  Baseline: Goal status: MET   LONG TERM GOALS: Target date: 09/08/24  Pt will decrease resting hip stiffness and pain by at least 50% Baseline: Pain of 7-8/10,  Goal status: INITIAL  2.  Pt will be able to dress without limitations including socks and shoes Baseline: Reports stiffness can limit her dressing of socks and shoes Goal status: INITIAL  3.  Pt will ambulate with only minimal trendelenburg pattern Baseline:  Goal status: INITIAL  4.  Pt will be ind with self care including HEP Baseline:  Goal status: INITIAL   PLAN:  PT FREQUENCY: 2x/week  PT DURATION: 8 weeks  PLANNED INTERVENTIONS: 97110-Therapeutic exercises, 97530- Therapeutic activity, 97112- Neuromuscular re-education, 97535- Self Care, 02859- Manual therapy, [id number]- Gait training, [id number]- Aquatic Therapy, Patient/Family education, Stair training, and DME instructions  PLAN FOR NEXT SESSION: Aquatic: general TE - precautions: avoid much joint or bone pain due to active bone mets in left hip, ribs, thoracic spine   Randall Pack, SPT  08/02/2024, 11:28 AM  I agree with the following treatment note after reviewing documentation. This session was performed under the supervision of a licensed clinician.  Saddie Raw, PT 08/02/24, 11:31 AM

## 2024-08-03 ENCOUNTER — Other Ambulatory Visit: Payer: Self-pay | Admitting: Radiation Therapy

## 2024-08-04 ENCOUNTER — Ambulatory Visit (HOSPITAL_BASED_OUTPATIENT_CLINIC_OR_DEPARTMENT_OTHER): Payer: Self-pay | Attending: Radiation Oncology | Admitting: Physical Therapy

## 2024-08-04 ENCOUNTER — Ambulatory Visit: Admitting: Rehabilitation

## 2024-08-04 ENCOUNTER — Telehealth (HOSPITAL_BASED_OUTPATIENT_CLINIC_OR_DEPARTMENT_OTHER): Payer: Self-pay | Admitting: Physical Therapy

## 2024-08-04 NOTE — Telephone Encounter (Signed)
 Patient did not show for aquatic PT appointment.  Called and left voice mail regarding missed appointment and requested patient return phone call to confirm next upcoming appointment.  [phone].  Per attendance policy: If patient has 2 occasions of late cancellation (less than 24 hr notice) or no-show, patient will be allowed to schedule one appointment at a time.  After the 3rd incident of late cancellation or no-show, patient will be discharged and require a new referral from physician to resume treatment.   Delon Aquas, PTA 08/04/24 9:27 AM [facility]

## 2024-08-05 ENCOUNTER — Telehealth: Payer: Self-pay | Admitting: Radiology

## 2024-08-05 ENCOUNTER — Inpatient Hospital Stay: Attending: Hematology & Oncology

## 2024-08-05 ENCOUNTER — Encounter: Payer: Self-pay | Admitting: Hematology & Oncology

## 2024-08-05 VITALS — BP 122/74 | HR 100 | Temp 97.7°F | Resp 19

## 2024-08-05 DIAGNOSIS — C50912 Malignant neoplasm of unspecified site of left female breast: Secondary | ICD-10-CM | POA: Diagnosis present

## 2024-08-05 DIAGNOSIS — Z21 Asymptomatic human immunodeficiency virus [HIV] infection status: Secondary | ICD-10-CM | POA: Diagnosis present

## 2024-08-05 DIAGNOSIS — D508 Other iron deficiency anemias: Secondary | ICD-10-CM

## 2024-08-05 DIAGNOSIS — Z79899 Other long term (current) drug therapy: Secondary | ICD-10-CM | POA: Insufficient documentation

## 2024-08-05 DIAGNOSIS — C7951 Secondary malignant neoplasm of bone: Secondary | ICD-10-CM | POA: Insufficient documentation

## 2024-08-05 DIAGNOSIS — D509 Iron deficiency anemia, unspecified: Secondary | ICD-10-CM | POA: Insufficient documentation

## 2024-08-05 MED ORDER — SODIUM CHLORIDE 0.9 % IV SOLN: INTRAVENOUS | Status: DC

## 2024-08-05 MED ORDER — DIPHENHYDRAMINE HCL 50 MG/ML IJ SOLN: 50.0000 mg | Freq: Once | INTRAMUSCULAR | Status: DC | PRN

## 2024-08-05 MED ORDER — ALBUTEROL SULFATE HFA 108 (90 BASE) MCG/ACT IN AERS: 2.0000 | INHALATION_SPRAY | Freq: Once | RESPIRATORY_TRACT | Status: DC | PRN

## 2024-08-05 MED ORDER — IRON SUCROSE 300 MG IVPB - SIMPLE MED
300.0000 mg | Freq: Once | Status: AC
  Administered 2024-08-05: 300 mg via INTRAVENOUS
  Filled 2024-08-05: qty 300

## 2024-08-05 MED ORDER — METHYLPREDNISOLONE SODIUM SUCC 125 MG IJ SOLR: 125.0000 mg | Freq: Once | INTRAMUSCULAR | Status: DC | PRN

## 2024-08-05 MED ORDER — SODIUM CHLORIDE 0.9 % IV SOLN: Freq: Once | INTRAVENOUS | Status: DC | PRN

## 2024-08-05 MED ORDER — EPINEPHRINE 0.3 MG/0.3ML IJ SOAJ: 0.3000 mg | Freq: Once | INTRAMUSCULAR | Status: DC | PRN

## 2024-08-05 MED ORDER — FAMOTIDINE IN NACL 20-0.9 MG/50ML-% IV SOLN: 20.0000 mg | Freq: Once | INTRAVENOUS | Status: DC | PRN

## 2024-08-05 NOTE — Patient Instructions (Signed)

## 2024-08-05 NOTE — Telephone Encounter (Signed)
 I called Briana [name] to review the results of her most recent MRI scan. 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 She remains asymptomatic from these lesions however she has noticed an enlarging bump on the left side of her head, at her hairline. Dr. Shannon has personally reviewed these images and recommends presenting her case at our brain and spine tumor board to discuss follow-up plans. I will call her with the plan moving forward after tumor board on 08/08/2024. Patient expressed understanding and is in agreement with this plan.   Results of MRI have been shared with Dr. Timmy.     Leeroy Due, PA-C

## 2024-08-08 ENCOUNTER — Inpatient Hospital Stay

## 2024-08-08 ENCOUNTER — Telehealth: Payer: Self-pay | Admitting: Genetic Counselor

## 2024-08-08 ENCOUNTER — Ambulatory Visit: Admitting: Radiation Oncology

## 2024-08-08 ENCOUNTER — Ambulatory Visit: Admitting: Rehabilitation

## 2024-08-08 NOTE — Telephone Encounter (Signed)
 This patient is on the brain tumor board.  I messaged Dr. Timmy and Warren Sora letting them know that she qualifies for genetic testing due to her young age and metastatic status.  She had been given a flyer in July about genetic testing.    Darice Monte, MS, CGC  Licensed, Patent Attorney [email or web address] phone: [phone]

## 2024-08-09 ENCOUNTER — Ambulatory Visit

## 2024-08-09 ENCOUNTER — Ambulatory Visit (HOSPITAL_BASED_OUTPATIENT_CLINIC_OR_DEPARTMENT_OTHER): Payer: Self-pay | Admitting: Physical Therapy

## 2024-08-09 ENCOUNTER — Telehealth (HOSPITAL_BASED_OUTPATIENT_CLINIC_OR_DEPARTMENT_OTHER): Payer: Self-pay | Admitting: Physical Therapy

## 2024-08-09 ENCOUNTER — Telehealth: Payer: Self-pay | Admitting: Radiology

## 2024-08-09 ENCOUNTER — Ambulatory Visit: Admitting: Radiation Oncology

## 2024-08-09 NOTE — Telephone Encounter (Signed)
 Patient did not show for aquatic therapy appointment today.  Called and left message regarding missed appointment.  Requested patient return phone call to [facility] at [phone], option 1, regarding missed appointment and to confirm next upcoming appointment.    Patient has No Showed to 2 AQUATIC THERAPY appointments and per the attendance policy, she will only be allowed to schedule 1 aquatic visit at a time. The next aquatic appointment will be left on the schedule and any remaining appointments will be cancelled by [facility] staff.   Information was also sent to evaluating PT and site supervisor via Personnel Officer.   Delon Aquas, PTA 08/09/24 9:42 AM [facility] [address] [place], [place], [address] Phone: [phone]   Fax:  [phone]

## 2024-08-09 NOTE — Telephone Encounter (Signed)
 Called patient 3x between today and yesterday. LVM for patient to return call to review discussion from tumor board and Dr. Donley recommendations.     Leeroy Due, PA-C

## 2024-08-10 ENCOUNTER — Ambulatory Visit: Admitting: Rehabilitation

## 2024-08-10 ENCOUNTER — Ambulatory Visit

## 2024-08-11 ENCOUNTER — Ambulatory Visit

## 2024-08-12 ENCOUNTER — Ambulatory Visit

## 2024-08-15 ENCOUNTER — Telehealth: Payer: Self-pay | Admitting: Radiology

## 2024-08-15 ENCOUNTER — Telehealth: Payer: Self-pay

## 2024-08-15 ENCOUNTER — Encounter (HOSPITAL_BASED_OUTPATIENT_CLINIC_OR_DEPARTMENT_OTHER): Attending: Radiation Oncology | Admitting: Physical Therapy

## 2024-08-15 ENCOUNTER — Ambulatory Visit

## 2024-08-15 MED ORDER — OXYCODONE HCL 10 MG PO TABS: 10.0000 mg | ORAL_TABLET | Freq: Four times a day (QID) | ORAL | 0 refills | Status: DC | PRN

## 2024-08-15 NOTE — Telephone Encounter (Signed)
 I called patient to review results of tumor board.  Consensus was to proceed with whole brain radiation (10 fractions) and spinal imaging if patient was symptomatic.  She noted a 1 day history of 10/10 left leg pain.  Patient experiences pain throughout the entire leg that radiates to her buttock and back.  She describes the pain as a deep ache and sharp in the back.  Pain is constant.  She does not have any pain medications to help with this.  She has noted a new knot on her left forehead, but denies any pain.  She denies any headaches, other back pain, numbness or tingling in the extremities, double vision, or changes in memory or mood.  She denies any left leg swelling, erythema, or warmth.  She denies any shortness of breath or chest pain.  She is scheduled for CT simulation tomorrow, 08/16/2024. We reviewed the anticipated acute and late sequelae associated with radiation in this setting.  The patient was encouraged to ask questions that I answered to the best of my ability.   Dr. Shannon will see the patient to review consent and determine need for further imaging.  Oxycodone  refilled today and sent to her preferred pharmacy to help with pain management in the interim.  We reviewed signs/symptoms of a DVT and she understands to go to the ED immediately if she develops any of these.    Leeroy Due, PA-C

## 2024-08-15 NOTE — Telephone Encounter (Signed)
 Patient called in to report severe pain to left leg. Patient rates pain 10/10. Patient states pain is shooting up the leg which is also causing some back pain. Denies any swelling, falls or injury to leg. Requesting something for pain be called in to [facility] on [address].

## 2024-08-16 ENCOUNTER — Ambulatory Visit

## 2024-08-16 ENCOUNTER — Ambulatory Visit
Admission: RE | Admit: 2024-08-16 | Discharge: 2024-08-16 | Attending: Radiation Oncology | Admitting: Radiation Oncology

## 2024-08-16 DIAGNOSIS — C50912 Malignant neoplasm of unspecified site of left female breast: Secondary | ICD-10-CM | POA: Insufficient documentation

## 2024-08-16 DIAGNOSIS — Z510 Encounter for antineoplastic radiation therapy: Secondary | ICD-10-CM | POA: Insufficient documentation

## 2024-08-16 DIAGNOSIS — Z171 Estrogen receptor negative status [ER-]: Secondary | ICD-10-CM | POA: Insufficient documentation

## 2024-08-16 DIAGNOSIS — C7951 Secondary malignant neoplasm of bone: Secondary | ICD-10-CM | POA: Insufficient documentation

## 2024-08-16 DIAGNOSIS — Z21 Asymptomatic human immunodeficiency virus [HIV] infection status: Secondary | ICD-10-CM | POA: Diagnosis not present

## 2024-08-17 ENCOUNTER — Ambulatory Visit

## 2024-08-17 ENCOUNTER — Ambulatory Visit: Admitting: Physical Therapy

## 2024-08-18 ENCOUNTER — Inpatient Hospital Stay: Admitting: Hematology & Oncology

## 2024-08-18 ENCOUNTER — Inpatient Hospital Stay

## 2024-08-18 ENCOUNTER — Ambulatory Visit

## 2024-08-18 ENCOUNTER — Ambulatory Visit: Payer: Self-pay | Admitting: Hematology & Oncology

## 2024-08-18 ENCOUNTER — Other Ambulatory Visit: Payer: Self-pay

## 2024-08-18 VITALS — Ht 65.0 in | Wt 127.0 lb

## 2024-08-18 DIAGNOSIS — Z21 Asymptomatic human immunodeficiency virus [HIV] infection status: Secondary | ICD-10-CM | POA: Diagnosis not present

## 2024-08-18 DIAGNOSIS — C773 Secondary and unspecified malignant neoplasm of axilla and upper limb lymph nodes: Secondary | ICD-10-CM

## 2024-08-18 DIAGNOSIS — C50912 Malignant neoplasm of unspecified site of left female breast: Secondary | ICD-10-CM

## 2024-08-18 DIAGNOSIS — C50919 Malignant neoplasm of unspecified site of unspecified female breast: Secondary | ICD-10-CM

## 2024-08-18 LAB — CBC WITH DIFFERENTIAL (CANCER CENTER ONLY)
Abs Immature Granulocytes: 0.11 K/uL — ABNORMAL HIGH (ref 0.00–0.07)
Basophils Absolute: 0 K/uL (ref 0.0–0.1)
Basophils Relative: 0 %
Eosinophils Absolute: 0.1 K/uL (ref 0.0–0.5)
Eosinophils Relative: 2 %
HCT: 30.3 % — ABNORMAL LOW (ref 36.0–46.0)
Hemoglobin: 9.9 g/dL — ABNORMAL LOW (ref 12.0–15.0)
Immature Granulocytes: 1 %
Lymphocytes Relative: 18 %
Lymphs Abs: 1.6 K/uL (ref 0.7–4.0)
MCH: 28.2 pg (ref 26.0–34.0)
MCHC: 32.7 g/dL (ref 30.0–36.0)
MCV: 86.3 fL (ref 80.0–100.0)
Monocytes Absolute: 1.1 K/uL — ABNORMAL HIGH (ref 0.1–1.0)
Monocytes Relative: 13 %
Neutro Abs: 5.6 K/uL (ref 1.7–7.7)
Neutrophils Relative %: 66 %
Platelet Count: 381 K/uL (ref 150–400)
RBC: 3.51 MIL/uL — ABNORMAL LOW (ref 3.87–5.11)
RDW: 15.8 % — ABNORMAL HIGH (ref 11.5–15.5)
WBC Count: 8.6 K/uL (ref 4.0–10.5)
nRBC: 0 % (ref 0.0–0.2)

## 2024-08-18 LAB — IRON AND IRON BINDING CAPACITY (CC-WL,HP ONLY)
Iron: 18 ug/dL — ABNORMAL LOW (ref 28–170)
Saturation Ratios: 10 % — ABNORMAL LOW (ref 10.4–31.8)
TIBC: 178 ug/dL — ABNORMAL LOW (ref 250–450)
UIBC: 160 ug/dL

## 2024-08-18 LAB — CMP (CANCER CENTER ONLY)
ALT: 6 U/L (ref 0–44)
AST: 26 U/L (ref 15–41)
Albumin: 3.6 g/dL (ref 3.5–5.0)
Alkaline Phosphatase: 161 U/L — ABNORMAL HIGH (ref 38–126)
Anion gap: 16 — ABNORMAL HIGH (ref 5–15)
BUN: 6 mg/dL (ref 6–20)
CO2: 18 mmol/L — ABNORMAL LOW (ref 22–32)
Calcium: 8.4 mg/dL — ABNORMAL LOW (ref 8.9–10.3)
Chloride: 99 mmol/L (ref 98–111)
Creatinine: 0.77 mg/dL (ref 0.44–1.00)
GFR, Estimated: >60 mL/min (ref >60)
Glucose, Bld: 124 mg/dL — ABNORMAL HIGH (ref 70–99)
Potassium: 3.1 mmol/L — ABNORMAL LOW (ref 3.5–5.1)
Sodium: 133 mmol/L — ABNORMAL LOW (ref 135–145)
Total Bilirubin: 0.6 mg/dL (ref 0.0–1.2)
Total Protein: 7.6 g/dL (ref 6.5–8.1)

## 2024-08-18 LAB — LACTATE DEHYDROGENASE: LDH: 469 U/L — ABNORMAL HIGH (ref 105–235)

## 2024-08-18 LAB — RETICULOCYTES
Immature Retic Fract: 18.7 % — ABNORMAL HIGH (ref 2.3–15.9)
RBC.: 3.51 MIL/uL — ABNORMAL LOW (ref 3.87–5.11)
Retic Count, Absolute: 76.2 K/uL (ref 19.0–186.0)
Retic Ct Pct: 2.2 % (ref 0.4–3.1)

## 2024-08-18 LAB — FERRITIN: Ferritin: 3467 ng/mL — ABNORMAL HIGH (ref 11–307)

## 2024-08-18 NOTE — Patient Instructions (Signed)
 [facility] - A DEPT OF [facility]  Discharge Instructions: Thank you for choosing [facility] to provide your oncology and hematology care.   If you have a lab appointment with the Cancer Center, please go directly to the Cancer Center and check in at the registration area.  Wear comfortable clothing and clothing appropriate for easy access to any Portacath or PICC line.   We strive to give you quality time with your provider. You may need to reschedule your appointment if you arrive late (15 or more minutes).  Arriving late affects you and other patients whose appointments are after yours.  Also, if you miss three or more appointments without notifying the office, you may be dismissed from the clinic at the provider's discretion.      For prescription refill requests, have your pharmacy contact our office and allow 72 hours for refills to be completed.    Today you received the following chemotherapy and/or immunotherapy agents Trastuzumab  Deruxtecan       To help prevent nausea and vomiting after your treatment, we encourage you to take your nausea medication as directed.  BELOW ARE SYMPTOMS THAT SHOULD BE REPORTED IMMEDIATELY: *FEVER GREATER THAN 100.4 F (38 C) OR HIGHER *CHILLS OR SWEATING *NAUSEA AND VOMITING THAT IS NOT CONTROLLED WITH YOUR NAUSEA MEDICATION *UNUSUAL SHORTNESS OF BREATH *UNUSUAL BRUISING OR BLEEDING *URINARY PROBLEMS (pain or burning when urinating, or frequent urination) *BOWEL PROBLEMS (unusual diarrhea, constipation, pain near the anus) TENDERNESS IN MOUTH AND THROAT WITH OR WITHOUT PRESENCE OF ULCERS (sore throat, sores in mouth, or a toothache) UNUSUAL RASH, SWELLING OR PAIN  UNUSUAL VAGINAL DISCHARGE OR ITCHING   Items with * indicate a potential emergency and should be followed up as soon as possible or go to the Emergency Department if any problems should occur.  Please show the CHEMOTHERAPY ALERT CARD or  IMMUNOTHERAPY ALERT CARD at check-in to the Emergency Department and triage nurse. Should you have questions after your visit or need to cancel or reschedule your appointment, please contact [facility] - A DEPT OF [facility]  [phone] and follow the prompts.  Office hours are 8:00 a.m. to 4:30 p.m. Monday - Friday. Please note that voicemails left after 4:00 p.m. may not be returned until the following business day.  We are closed weekends and major holidays. You have access to a nurse at all times for urgent questions. Please call the main number to the clinic [phone] and follow the prompts.  For any non-urgent questions, you may also contact your provider using MyChart. We now offer e-Visits for anyone 13 and older to request care online for non-urgent symptoms. For details visit [email or web address].   Also download the MyChart app! Go to the app store, search MyChart, open the app, select [place], and log in with your MyChart username and password.

## 2024-08-18 NOTE — Progress Notes (Unsigned)
 Is here Hematology and Oncology Follow Up Visit  Briana [name] [id number] 06-06-1976 48 y.o. 08/18/2024   Principle Diagnosis:  Stage IIIC (T4N3M0) - TRIPLE NEGATIVE - infiltrating ductal carcinoma of the left breast -metastatic to bone -- HER2 2+ (IHC) HIV (+) Iron  deficiency anemia  Current Therapy:   Neoadjuvant  chemoimmunotherapy --- carboplatin /Taxol /pembrolizumab  ---s/p cycle #3 - start on 02/19/2023 S/p LEFT MRM - 06/08/2023 --  Residual breast cancer - 3/3 nodes (-) XRT -- Start on 07/22/2023 --completed on 09/15/2023 Xeloda  - s/p cycle #3 on 09/28/2023 Zometa  4 mg IV every 3 months-next dose to be given on 09/2024 Enhertu  5.4 mg/m IV every 3 weeks-s/p cycle #6  -- start on 01/13/2024 Radiotherapy to multiple sites-completed on 01/21/2024 IV iron -Venofer  given on 01/13/2024     Interim History:  Briana [name] is back for follow-up.  She is still working at [employer].  I think she worked last night.  I think she may have just started radiotherapy for the clivus.  I also think she may be getting some radiation to the left femur.  She has lost some more weight  .  This really bothers me.  I am unsure if she is taking the Megace .  I will call this in for her to make sure that she does take it.  Her pain seems to be under decent control.  She has had no cough.  She has had no change in bowel or bladder habits.  She has had no issues with respect to the Biktarvy .  Thankfully, the HIV really has never been a problem for us .  We have given her IV iron .  Her iron  is still on the low side.  Her iron  saturation is only 10%.  We need to give her some more IV iron .  Clearly, we are to have to get her on some kind of systemic therapy for this breast cancer.  I think that would be reasonable would be Doxil.  I think this would have a good chance of helping.  I think she could tolerate this.  She had a echocardiogram that was done back in May.  She had a good ejection fraction of  55-60%.  Again I talked her about the Doxil.  We probably need to start this after she has her radiotherapy.  I think she will finish the radiotherapy sometime in early or mid January.  Currently, I would have to say that her performance status is probably ECOG 2.     Wt Readings from Last 3 Encounters:  08/18/24 127 lb (57.6 kg)  07/27/24 134 lb (60.8 kg)  07/27/24 131 lb 8 oz (59.6 kg)    Medications:  Current Outpatient Medications:    amLODipine  (NORVASC ) 10 MG tablet, TAKE 1 TABLET(10 MG) BY MOUTH DAILY, Disp: 90 tablet, Rfl: 0   atorvastatin  (LIPITOR) 10 MG tablet, Take 1 tablet (10 mg total) by mouth daily., Disp: 30 tablet, Rfl: 5   [START ON 09/13/2024] bictegravir-emtricitabine -tenofovir  AF (BIKTARVY ) 50-200-25 MG TABS tablet, Take 1 tablet by mouth daily., Disp: 30 tablet, Rfl: 5   dexamethasone  (DECADRON ) 4 MG tablet, Take 2 tablets (8 mg) by mouth daily for 3 days starting the day after chemotherapy. Take with food., Disp: 30 tablet, Rfl: 1   hydrOXYzine  (ATARAX ) 25 MG tablet, Take 1 tablet (25 mg total) by mouth every 8 (eight) hours as needed., Disp: 60 tablet, Rfl: 3   lidocaine -prilocaine  (EMLA ) cream, Apply to affected area once, Disp: 30 g, Rfl: 3  megestrol  (MEGACE  ES) 625 MG/5ML suspension, Take 5 mLs (625 mg total) by mouth daily., Disp: 150 mL, Rfl: 3   methocarbamol  (ROBAXIN ) 500 MG tablet, Take 1 tablet (500 mg total) by mouth every 8 (eight) hours as needed for muscle spasms., Disp: 20 tablet, Rfl: 0   ondansetron  (ZOFRAN ) 8 MG tablet, Take 1 tablet (8 mg total) by mouth every 8 (eight) hours as needed for nausea or vomiting. Start on the third day after chemotherapy., Disp: 30 tablet, Rfl: 1   oxyCODONE  (OXYCONTIN ) 20 mg 12 hr tablet, Take 1 tablet (20 mg total) by mouth every 12 (twelve) hours., Disp: 60 tablet, Rfl: 0   Oxycodone  HCl 10 MG TABS, Take 1 tablet (10 mg total) by mouth every 6 (six) hours as needed., Disp: 60 tablet, Rfl: 0   polyethylene glycol  (MIRALAX  / GLYCOLAX ) 17 g packet, Take 17 g by mouth daily as needed for moderate constipation., Disp: 14 each, Rfl: 0   potassium chloride  SA (KLOR-CON  M) 20 MEQ tablet, Take 1 tablet (20 mEq total) by mouth 2 (two) times daily., Disp: , Rfl:    prochlorperazine  (COMPAZINE ) 10 MG tablet, Take 1 tablet (10 mg total) by mouth every 6 (six) hours as needed for nausea or vomiting., Disp: 30 tablet, Rfl: 1   senna-docusate (SENOKOT-S) 8.6-50 MG tablet, Take 1 tablet by mouth 2 (two) times daily., Disp: 30 tablet, Rfl: 0   triamcinolone  (KENALOG ) 0.025 % ointment, Apply topically 2 (two) times daily as needed., Disp: 454 g, Rfl: 5 No current facility-administered medications for this visit.  Facility-Administered Medications Ordered in Other Visits:    0.9 %  sodium chloride  infusion, , Intravenous, Continuous, Franchot Lauraine HERO, NP, Stopped at 01/13/24 1502   dextrose  5 % solution, , Intravenous, Continuous, Arin Peral, Maude SAUNDERS, MD, Last Rate: 400 mL/hr at 01/13/24 1652, Infusion Verify at 01/13/24 1652   sodium chloride  flush (NS) 0.9 % injection 10 mL, 10 mL, Intracatheter, PRN, Abena Erdman R, MD, 10 mL at 01/13/24 1648  Allergies:  Allergies  Allergen Reactions   Pumpkin Flavoring Agent (Non-Screening) Swelling    ONLY Lip swelling after eating pumpkin pie    Past Medical History, Surgical history, Social history, and Family History were reviewed and updated.  Review of Systems: Review of Systems  Constitutional: Negative.   HENT:  Negative.    Eyes: Negative.   Respiratory: Negative.    Cardiovascular: Negative.   Gastrointestinal: Negative.   Endocrine: Negative.   Genitourinary: Negative.    Musculoskeletal: Negative.   Skin: Negative.   Neurological: Negative.   Hematological: Negative.   Psychiatric/Behavioral: Negative.      Physical Exam:   Vital signs show temperature of 99.5.  Pulse 119.  Blood pressure 104/76.  Weight is 127 pounds.    Physical Exam Vitals reviewed.   HENT:     Head: Normocephalic and atraumatic.  Eyes:     Pupils: Pupils are equal, round, and reactive to light.  Cardiovascular:     Rate and Rhythm: Normal rate and regular rhythm.     Heart sounds: Normal heart sounds.  Pulmonary:     Effort: Pulmonary effort is normal.     Breath sounds: Normal breath sounds.  Abdominal:     General: Bowel sounds are normal.     Palpations: Abdomen is soft.  Musculoskeletal:        General: No tenderness or deformity. Normal range of motion.     Cervical back: Normal range of motion.  Lymphadenopathy:  Cervical: No cervical adenopathy.  Skin:    General: Skin is warm and dry.     Findings: No erythema or rash.  Neurological:     Mental Status: She is alert and oriented to person, place, and time.  Psychiatric:        Behavior: Behavior normal.        Thought Content: Thought content normal.        Judgment: Judgment normal.     Lab Results  Component Value Date   WBC 8.6 08/18/2024   HGB 9.9 (L) 08/18/2024   HCT 30.3 (L) 08/18/2024   MCV 86.3 08/18/2024   PLT 381 08/18/2024     Chemistry      Component Value Date/Time   NA 133 (L) 08/18/2024 1031   K 3.1 (L) 08/18/2024 1031   CL 99 08/18/2024 1031   CO2 18 (L) 08/18/2024 1031   BUN 6 08/18/2024 1031   CREATININE 0.77 08/18/2024 1031   CREATININE 0.62 10/17/2022 0947      Component Value Date/Time   CALCIUM  8.4 (L) 08/18/2024 1031   ALKPHOS 161 (H) 08/18/2024 1031   AST 26 08/18/2024 1031   ALT 6 08/18/2024 1031   BILITOT 0.6 08/18/2024 1031       Impression and Plan: Ms. Abbruzzese is a very nice 48 year old premenopausal African American female with TRIPLE NEGATIVE breast cancer.  She has aggressive disease in my opinion.  She has already recurred. She has recurred in her bones and in her lymph nodes.  We did get the lymph node biopsy.  Surprisingly, the cancer was CD30 positive.    I will try her on Doxil.  Let us  see how Doxil might help.  We certainly can use  her tumor markers as a response marker.  Again, this is all about quality of life.  Hopefully, she will have a good response with radiotherapy.  Again, we will start her treatment after she finishes her radiotherapy.   Maude JONELLE Crease, MD 12/18/202512:11 PM

## 2024-08-19 ENCOUNTER — Encounter: Payer: Self-pay | Admitting: Family

## 2024-08-19 ENCOUNTER — Encounter: Payer: Self-pay | Admitting: Hematology & Oncology

## 2024-08-19 ENCOUNTER — Ambulatory Visit

## 2024-08-19 ENCOUNTER — Telehealth: Payer: Self-pay | Admitting: Hematology & Oncology

## 2024-08-19 LAB — CANCER ANTIGEN 27.29: CA 27.29: 15.6 U/mL (ref 0.0–38.6)

## 2024-08-19 MED ORDER — MEGESTROL ACETATE 625 MG/5ML PO SUSP: 625.0000 mg | Freq: Every day | ORAL | 3 refills | Status: AC

## 2024-08-19 NOTE — Telephone Encounter (Signed)
 Called to schedule infusion per inbasket. LVM to return call for scheduling.

## 2024-08-22 ENCOUNTER — Ambulatory Visit: Admitting: Radiation Oncology

## 2024-08-22 ENCOUNTER — Ambulatory Visit

## 2024-08-22 ENCOUNTER — Encounter: Payer: Self-pay | Admitting: Hematology & Oncology

## 2024-08-22 ENCOUNTER — Other Ambulatory Visit: Payer: Self-pay

## 2024-08-22 DIAGNOSIS — Z21 Asymptomatic human immunodeficiency virus [HIV] infection status: Secondary | ICD-10-CM | POA: Diagnosis not present

## 2024-08-22 DIAGNOSIS — C50919 Malignant neoplasm of unspecified site of unspecified female breast: Secondary | ICD-10-CM

## 2024-08-22 LAB — RAD ONC ARIA SESSION SUMMARY
Course Elapsed Days: 0
Plan Fractions Treated to Date: 1
Plan Prescribed Dose Per Fraction: 2.5 Gy
Plan Total Fractions Prescribed: 14
Plan Total Prescribed Dose: 35 Gy
Reference Point Dosage Given to Date: 2.5 Gy
Reference Point Session Dosage Given: 2.5 Gy
Session Number: 1

## 2024-08-23 ENCOUNTER — Ambulatory Visit (HOSPITAL_BASED_OUTPATIENT_CLINIC_OR_DEPARTMENT_OTHER): Admitting: Physical Therapy

## 2024-08-23 ENCOUNTER — Ambulatory Visit
Admission: RE | Admit: 2024-08-23 | Discharge: 2024-08-23 | Disposition: A | Discharge status: Home / Self Care | Source: Ambulatory Visit | Attending: Radiation Oncology | Admitting: Radiation Oncology

## 2024-08-23 ENCOUNTER — Other Ambulatory Visit: Payer: Self-pay

## 2024-08-23 DIAGNOSIS — Z21 Asymptomatic human immunodeficiency virus [HIV] infection status: Secondary | ICD-10-CM | POA: Diagnosis not present

## 2024-08-23 DIAGNOSIS — C7931 Secondary malignant neoplasm of brain: Secondary | ICD-10-CM

## 2024-08-23 LAB — RAD ONC ARIA SESSION SUMMARY
Course Elapsed Days: 1
Plan Fractions Treated to Date: 2
Plan Prescribed Dose Per Fraction: 2.5 Gy
Plan Total Fractions Prescribed: 14
Plan Total Prescribed Dose: 35 Gy
Reference Point Dosage Given to Date: 5 Gy
Reference Point Session Dosage Given: 2.5 Gy
Session Number: 2

## 2024-08-23 MED ORDER — SONAFINE EX EMUL
1.0000 | Freq: Once | CUTANEOUS | Status: AC
  Administered 2024-08-23: 1 via TOPICAL

## 2024-08-24 ENCOUNTER — Other Ambulatory Visit: Payer: Self-pay

## 2024-08-24 ENCOUNTER — Ambulatory Visit
Admission: RE | Admit: 2024-08-24 | Discharge: 2024-08-24 | Disposition: A | Source: Ambulatory Visit | Attending: Radiation Oncology | Admitting: Radiation Oncology

## 2024-08-24 DIAGNOSIS — Z21 Asymptomatic human immunodeficiency virus [HIV] infection status: Secondary | ICD-10-CM | POA: Diagnosis not present

## 2024-08-24 LAB — RAD ONC ARIA SESSION SUMMARY
Course Elapsed Days: 2
Plan Fractions Treated to Date: 3
Plan Prescribed Dose Per Fraction: 2.5 Gy
Plan Total Fractions Prescribed: 14
Plan Total Prescribed Dose: 35 Gy
Reference Point Dosage Given to Date: 7.5 Gy
Reference Point Session Dosage Given: 2.5 Gy
Session Number: 3

## 2024-08-29 ENCOUNTER — Ambulatory Visit
Admission: RE | Admit: 2024-08-29 | Discharge: 2024-08-29 | Disposition: A | Discharge status: Home / Self Care | Source: Ambulatory Visit | Attending: Radiation Oncology | Admitting: Radiation Oncology

## 2024-08-29 ENCOUNTER — Other Ambulatory Visit: Payer: Self-pay

## 2024-08-29 ENCOUNTER — Ambulatory Visit (HOSPITAL_BASED_OUTPATIENT_CLINIC_OR_DEPARTMENT_OTHER): Admitting: Physical Therapy

## 2024-08-29 DIAGNOSIS — Z21 Asymptomatic human immunodeficiency virus [HIV] infection status: Secondary | ICD-10-CM | POA: Diagnosis not present

## 2024-08-29 LAB — RAD ONC ARIA SESSION SUMMARY
Course Elapsed Days: 7
Plan Fractions Treated to Date: 4
Plan Prescribed Dose Per Fraction: 2.5 Gy
Plan Total Fractions Prescribed: 14
Plan Total Prescribed Dose: 35 Gy
Reference Point Dosage Given to Date: 10 Gy
Reference Point Session Dosage Given: 2.5 Gy
Session Number: 4

## 2024-08-30 ENCOUNTER — Inpatient Hospital Stay

## 2024-08-30 ENCOUNTER — Other Ambulatory Visit: Payer: Self-pay

## 2024-08-30 ENCOUNTER — Ambulatory Visit
Admission: RE | Admit: 2024-08-30 | Discharge: 2024-08-30 | Disposition: A | Discharge status: Home / Self Care | Source: Ambulatory Visit | Attending: Radiation Oncology | Admitting: Radiation Oncology

## 2024-08-30 VITALS — BP 132/74 | HR 92 | Temp 98.4°F

## 2024-08-30 DIAGNOSIS — Z21 Asymptomatic human immunodeficiency virus [HIV] infection status: Secondary | ICD-10-CM | POA: Diagnosis not present

## 2024-08-30 DIAGNOSIS — D508 Other iron deficiency anemias: Secondary | ICD-10-CM

## 2024-08-30 LAB — RAD ONC ARIA SESSION SUMMARY
Course Elapsed Days: 8
Plan Fractions Treated to Date: 5
Plan Prescribed Dose Per Fraction: 2.5 Gy
Plan Total Fractions Prescribed: 14
Plan Total Prescribed Dose: 35 Gy
Reference Point Dosage Given to Date: 12.5 Gy
Reference Point Session Dosage Given: 2.5 Gy
Session Number: 5

## 2024-08-30 MED ORDER — SODIUM CHLORIDE 0.9 % IV SOLN: INTRAVENOUS | Status: DC

## 2024-08-30 MED ORDER — IRON SUCROSE 300 MG IVPB - SIMPLE MED
300.0000 mg | Freq: Once | Status: AC
  Administered 2024-08-30: 300 mg via INTRAVENOUS
  Filled 2024-08-30: qty 300

## 2024-08-30 NOTE — Patient Instructions (Signed)

## 2024-08-31 ENCOUNTER — Ambulatory Visit (HOSPITAL_BASED_OUTPATIENT_CLINIC_OR_DEPARTMENT_OTHER): Admitting: Physical Therapy

## 2024-08-31 ENCOUNTER — Ambulatory Visit

## 2024-08-31 ENCOUNTER — Other Ambulatory Visit: Payer: Self-pay

## 2024-08-31 ENCOUNTER — Ambulatory Visit
Admission: RE | Admit: 2024-08-31 | Discharge: 2024-08-31 | Disposition: A | Discharge status: Home / Self Care | Source: Ambulatory Visit | Attending: Radiation Oncology | Admitting: Radiation Oncology

## 2024-08-31 DIAGNOSIS — Z21 Asymptomatic human immunodeficiency virus [HIV] infection status: Secondary | ICD-10-CM | POA: Diagnosis not present

## 2024-08-31 LAB — RAD ONC ARIA SESSION SUMMARY
Course Elapsed Days: 9
Plan Fractions Treated to Date: 6
Plan Prescribed Dose Per Fraction: 2.5 Gy
Plan Total Fractions Prescribed: 14
Plan Total Prescribed Dose: 35 Gy
Reference Point Dosage Given to Date: 15 Gy
Reference Point Session Dosage Given: 2.5 Gy
Session Number: 6

## 2024-09-02 ENCOUNTER — Ambulatory Visit
Admission: RE | Admit: 2024-09-02 | Discharge: 2024-09-02 | Disposition: A | Discharge status: Home / Self Care | Source: Ambulatory Visit | Attending: Radiation Oncology | Admitting: Radiation Oncology

## 2024-09-02 ENCOUNTER — Other Ambulatory Visit: Payer: Self-pay

## 2024-09-02 DIAGNOSIS — C7931 Secondary malignant neoplasm of brain: Secondary | ICD-10-CM | POA: Insufficient documentation

## 2024-09-02 DIAGNOSIS — Z510 Encounter for antineoplastic radiation therapy: Secondary | ICD-10-CM | POA: Insufficient documentation

## 2024-09-02 DIAGNOSIS — Z170 Estrogen receptor positive status [ER+]: Secondary | ICD-10-CM | POA: Insufficient documentation

## 2024-09-02 DIAGNOSIS — C50912 Malignant neoplasm of unspecified site of left female breast: Secondary | ICD-10-CM | POA: Insufficient documentation

## 2024-09-02 LAB — RAD ONC ARIA SESSION SUMMARY
Course Elapsed Days: 11
Plan Fractions Treated to Date: 7
Plan Prescribed Dose Per Fraction: 2.5 Gy
Plan Total Fractions Prescribed: 14
Plan Total Prescribed Dose: 35 Gy
Reference Point Dosage Given to Date: 17.5 Gy
Reference Point Session Dosage Given: 2.5 Gy
Session Number: 7

## 2024-09-05 ENCOUNTER — Other Ambulatory Visit: Payer: Self-pay

## 2024-09-05 ENCOUNTER — Ambulatory Visit
Admission: RE | Admit: 2024-09-05 | Discharge: 2024-09-05 | Disposition: A | Discharge status: Home / Self Care | Source: Ambulatory Visit | Attending: Radiation Oncology | Admitting: Radiation Oncology

## 2024-09-05 LAB — RAD ONC ARIA SESSION SUMMARY
Course Elapsed Days: 14
Plan Fractions Treated to Date: 8
Plan Prescribed Dose Per Fraction: 2.5 Gy
Plan Total Fractions Prescribed: 14
Plan Total Prescribed Dose: 35 Gy
Reference Point Dosage Given to Date: 20 Gy
Reference Point Session Dosage Given: 2.5 Gy
Session Number: 8

## 2024-09-06 ENCOUNTER — Other Ambulatory Visit: Payer: Self-pay

## 2024-09-06 ENCOUNTER — Other Ambulatory Visit: Payer: Self-pay | Admitting: *Deleted

## 2024-09-06 ENCOUNTER — Ambulatory Visit
Admission: RE | Admit: 2024-09-06 | Discharge: 2024-09-06 | Disposition: A | Source: Ambulatory Visit | Attending: Radiation Oncology | Admitting: Radiation Oncology

## 2024-09-06 ENCOUNTER — Ambulatory Visit

## 2024-09-06 ENCOUNTER — Inpatient Hospital Stay: Attending: Hematology & Oncology

## 2024-09-06 VITALS — BP 117/83 | HR 113 | Temp 98.8°F | Resp 19

## 2024-09-06 DIAGNOSIS — Z5111 Encounter for antineoplastic chemotherapy: Secondary | ICD-10-CM | POA: Insufficient documentation

## 2024-09-06 DIAGNOSIS — Z21 Asymptomatic human immunodeficiency virus [HIV] infection status: Secondary | ICD-10-CM | POA: Insufficient documentation

## 2024-09-06 DIAGNOSIS — D508 Other iron deficiency anemias: Secondary | ICD-10-CM

## 2024-09-06 DIAGNOSIS — C7951 Secondary malignant neoplasm of bone: Secondary | ICD-10-CM | POA: Insufficient documentation

## 2024-09-06 DIAGNOSIS — C50912 Malignant neoplasm of unspecified site of left female breast: Secondary | ICD-10-CM | POA: Insufficient documentation

## 2024-09-06 DIAGNOSIS — Z510 Encounter for antineoplastic radiation therapy: Secondary | ICD-10-CM | POA: Insufficient documentation

## 2024-09-06 DIAGNOSIS — K219 Gastro-esophageal reflux disease without esophagitis: Secondary | ICD-10-CM | POA: Insufficient documentation

## 2024-09-06 DIAGNOSIS — D509 Iron deficiency anemia, unspecified: Secondary | ICD-10-CM | POA: Insufficient documentation

## 2024-09-06 DIAGNOSIS — Z79899 Other long term (current) drug therapy: Secondary | ICD-10-CM | POA: Insufficient documentation

## 2024-09-06 LAB — RAD ONC ARIA SESSION SUMMARY
Course Elapsed Days: 15
Plan Fractions Treated to Date: 9
Plan Prescribed Dose Per Fraction: 2.5 Gy
Plan Total Fractions Prescribed: 14
Plan Total Prescribed Dose: 35 Gy
Reference Point Dosage Given to Date: 22.5 Gy
Reference Point Session Dosage Given: 2.5 Gy
Session Number: 9

## 2024-09-06 MED ORDER — FAMOTIDINE 40 MG PO TABS: 40.0000 mg | ORAL_TABLET | Freq: Two times a day (BID) | ORAL | 1 refills | Status: AC

## 2024-09-06 MED ORDER — IRON SUCROSE 300 MG IVPB - SIMPLE MED
300.0000 mg | Freq: Once | Status: AC
  Administered 2024-09-06: 300 mg via INTRAVENOUS
  Filled 2024-09-06: qty 300

## 2024-09-06 MED ORDER — SODIUM CHLORIDE 0.9 % IV SOLN: INTRAVENOUS | Status: DC

## 2024-09-06 NOTE — Patient Instructions (Signed)

## 2024-09-06 NOTE — Progress Notes (Signed)
 Pt complaining of indigestion and states that she feels like burping all the time and would like to know if there is a medication she can take other than Tums, which is not working.  Dr. Timmy notified. Order received for pt to take Pepcid  40 mg PO BID.  Prescription sent to the [facility] on [address] per pt.'s request.

## 2024-09-07 ENCOUNTER — Other Ambulatory Visit: Payer: Self-pay | Admitting: Radiation Oncology

## 2024-09-07 ENCOUNTER — Ambulatory Visit
Admission: RE | Admit: 2024-09-07 | Discharge: 2024-09-07 | Disposition: A | Discharge status: Home / Self Care | Source: Ambulatory Visit | Attending: Radiation Oncology | Admitting: Radiation Oncology

## 2024-09-07 ENCOUNTER — Ambulatory Visit

## 2024-09-07 ENCOUNTER — Other Ambulatory Visit: Payer: Self-pay

## 2024-09-07 ENCOUNTER — Ambulatory Visit: Admitting: Physical Therapy

## 2024-09-07 LAB — RAD ONC ARIA SESSION SUMMARY
Course Elapsed Days: 16
Plan Fractions Treated to Date: 10
Plan Prescribed Dose Per Fraction: 2.5 Gy
Plan Total Fractions Prescribed: 14
Plan Total Prescribed Dose: 35 Gy
Reference Point Dosage Given to Date: 25 Gy
Reference Point Session Dosage Given: 2.5 Gy
Session Number: 10

## 2024-09-07 MED ORDER — FLUCONAZOLE 100 MG PO TABS: 100.0000 mg | ORAL_TABLET | Freq: Every day | ORAL | 0 refills | Status: AC

## 2024-09-08 ENCOUNTER — Ambulatory Visit
Admission: RE | Admit: 2024-09-08 | Discharge: 2024-09-08 | Disposition: A | Discharge status: Home / Self Care | Source: Ambulatory Visit | Attending: Radiation Oncology | Admitting: Radiation Oncology

## 2024-09-08 ENCOUNTER — Other Ambulatory Visit: Payer: Self-pay

## 2024-09-08 LAB — RAD ONC ARIA SESSION SUMMARY
Course Elapsed Days: 17
Plan Fractions Treated to Date: 11
Plan Prescribed Dose Per Fraction: 2.5 Gy
Plan Total Fractions Prescribed: 14
Plan Total Prescribed Dose: 35 Gy
Reference Point Dosage Given to Date: 27.5 Gy
Reference Point Session Dosage Given: 2.5 Gy
Session Number: 11

## 2024-09-09 ENCOUNTER — Other Ambulatory Visit: Payer: Self-pay

## 2024-09-09 ENCOUNTER — Telehealth: Payer: Self-pay

## 2024-09-09 ENCOUNTER — Ambulatory Visit
Admission: RE | Admit: 2024-09-09 | Discharge: 2024-09-09 | Disposition: A | Source: Ambulatory Visit | Attending: Radiation Oncology | Admitting: Radiation Oncology

## 2024-09-09 ENCOUNTER — Ambulatory Visit: Attending: Radiation Oncology

## 2024-09-09 DIAGNOSIS — M25551 Pain in right hip: Secondary | ICD-10-CM | POA: Insufficient documentation

## 2024-09-09 DIAGNOSIS — M25552 Pain in left hip: Secondary | ICD-10-CM | POA: Insufficient documentation

## 2024-09-09 DIAGNOSIS — Z171 Estrogen receptor negative status [ER-]: Secondary | ICD-10-CM | POA: Insufficient documentation

## 2024-09-09 DIAGNOSIS — C50912 Malignant neoplasm of unspecified site of left female breast: Secondary | ICD-10-CM | POA: Insufficient documentation

## 2024-09-09 DIAGNOSIS — Z483 Aftercare following surgery for neoplasm: Secondary | ICD-10-CM | POA: Insufficient documentation

## 2024-09-09 LAB — RAD ONC ARIA SESSION SUMMARY
Course Elapsed Days: 18
Plan Fractions Treated to Date: 12
Plan Prescribed Dose Per Fraction: 2.5 Gy
Plan Total Fractions Prescribed: 14
Plan Total Prescribed Dose: 35 Gy
Reference Point Dosage Given to Date: 30 Gy
Reference Point Session Dosage Given: 2.5 Gy
Session Number: 12

## 2024-09-09 NOTE — Telephone Encounter (Signed)
 Called and LVM for pt to call us  back if she would like to R/S her last physical therapy appt. Todays visit overlapped with radiation so she was unable to come to her last appt today.

## 2024-09-12 ENCOUNTER — Ambulatory Visit
Admission: RE | Admit: 2024-09-12 | Discharge: 2024-09-12 | Disposition: A | Source: Ambulatory Visit | Attending: Radiation Oncology | Admitting: Radiation Oncology

## 2024-09-12 ENCOUNTER — Other Ambulatory Visit: Payer: Self-pay

## 2024-09-12 LAB — RAD ONC ARIA SESSION SUMMARY
Course Elapsed Days: 21
Plan Fractions Treated to Date: 13
Plan Prescribed Dose Per Fraction: 2.5 Gy
Plan Total Fractions Prescribed: 14
Plan Total Prescribed Dose: 35 Gy
Reference Point Dosage Given to Date: 32.5 Gy
Reference Point Session Dosage Given: 2.5 Gy
Session Number: 13

## 2024-09-13 ENCOUNTER — Ambulatory Visit
Admission: RE | Admit: 2024-09-13 | Discharge: 2024-09-13 | Disposition: A | Source: Ambulatory Visit | Attending: Radiation Oncology | Admitting: Radiation Oncology

## 2024-09-13 ENCOUNTER — Other Ambulatory Visit: Payer: Self-pay

## 2024-09-13 LAB — RAD ONC ARIA SESSION SUMMARY
Course Elapsed Days: 22
Plan Fractions Treated to Date: 14
Plan Prescribed Dose Per Fraction: 2.5 Gy
Plan Total Fractions Prescribed: 14
Plan Total Prescribed Dose: 35 Gy
Reference Point Dosage Given to Date: 35 Gy
Reference Point Session Dosage Given: 2.5 Gy
Session Number: 14

## 2024-09-14 NOTE — Radiation Completion Notes (Addendum)
" °  Radiation Oncology         [phone] ________________________________  Name: Briana [name] MRN: [id number]  Date of Service: 09/13/2024  DOB: [birth year]  End of Treatment Note  Diagnosis: Brain metastases from Stage IIIC (cT4, N3, M0) Invasive Ductal Carcinoma of the left breast, TRIPLE NEGATIVE Intent: Palliative     ==========DELIVERED PLANS==========  First Treatment Date: 2024-08-22 Last Treatment Date: 2024-09-13   Plan Name: Brain_Whole Site: Brain Technique: 3D Mode: Photon Dose Per Fraction: 2.5 Gy Prescribed Dose (Delivered / Prescribed): 35 Gy / 35 Gy Prescribed Fxs (Delivered / Prescribed): 14 / 14    ====================================   The patient tolerated radiation. She developed some hyperpigmentation throughout the scalp.   The patient will return in one month and will continue follow up with Dr. Timmy as well.      Ronita Due, PA-C "

## 2024-09-16 ENCOUNTER — Other Ambulatory Visit: Payer: Self-pay | Admitting: Radiation Oncology

## 2024-09-16 MED ORDER — OXYCODONE HCL 10 MG PO TABS: 10.0000 mg | ORAL_TABLET | Freq: Four times a day (QID) | ORAL | 0 refills | Status: AC | PRN

## 2024-09-19 ENCOUNTER — Inpatient Hospital Stay

## 2024-09-19 ENCOUNTER — Inpatient Hospital Stay: Admitting: Family

## 2024-09-19 ENCOUNTER — Other Ambulatory Visit: Payer: Self-pay | Admitting: Hematology & Oncology

## 2024-09-19 VITALS — BP 124/84 | HR 113 | Temp 97.7°F | Resp 20 | Ht 65.0 in | Wt 126.0 lb

## 2024-09-19 DIAGNOSIS — C50919 Malignant neoplasm of unspecified site of unspecified female breast: Secondary | ICD-10-CM

## 2024-09-19 DIAGNOSIS — D508 Other iron deficiency anemias: Secondary | ICD-10-CM

## 2024-09-19 DIAGNOSIS — C50912 Malignant neoplasm of unspecified site of left female breast: Secondary | ICD-10-CM

## 2024-09-19 LAB — CBC WITH DIFFERENTIAL (CANCER CENTER ONLY)
Abs Immature Granulocytes: 0.21 K/uL — ABNORMAL HIGH (ref 0.00–0.07)
Basophils Absolute: 0 K/uL (ref 0.0–0.1)
Basophils Relative: 0 %
Eosinophils Absolute: 0.1 K/uL (ref 0.0–0.5)
Eosinophils Relative: 1 %
HCT: 28.5 % — ABNORMAL LOW (ref 36.0–46.0)
Hemoglobin: 9.2 g/dL — ABNORMAL LOW (ref 12.0–15.0)
Immature Granulocytes: 2 %
Lymphocytes Relative: 7 %
Lymphs Abs: 0.7 K/uL (ref 0.7–4.0)
MCH: 28.5 pg (ref 26.0–34.0)
MCHC: 32.3 g/dL (ref 30.0–36.0)
MCV: 88.2 fL (ref 80.0–100.0)
Monocytes Absolute: 0.6 K/uL (ref 0.1–1.0)
Monocytes Relative: 7 %
Neutro Abs: 7.4 K/uL (ref 1.7–7.7)
Neutrophils Relative %: 83 %
Platelet Count: 282 K/uL (ref 150–400)
RBC: 3.23 MIL/uL — ABNORMAL LOW (ref 3.87–5.11)
RDW: 17.9 % — ABNORMAL HIGH (ref 11.5–15.5)
WBC Count: 9 K/uL (ref 4.0–10.5)
nRBC: 0 % (ref 0.0–0.2)

## 2024-09-19 LAB — IRON AND IRON BINDING CAPACITY (CC-WL,HP ONLY)
Iron: 33 ug/dL (ref 28–170)
Saturation Ratios: 17 % (ref 10.4–31.8)
TIBC: 190 ug/dL — ABNORMAL LOW (ref 250–450)
UIBC: 158 ug/dL

## 2024-09-19 LAB — SAMPLE TO BLOOD BANK

## 2024-09-19 LAB — CMP (CANCER CENTER ONLY)
ALT: 7 U/L (ref 0–44)
AST: 21 U/L (ref 15–41)
Albumin: 3.4 g/dL — ABNORMAL LOW (ref 3.5–5.0)
Alkaline Phosphatase: 163 U/L — ABNORMAL HIGH (ref 38–126)
Anion gap: 12 (ref 5–15)
BUN: 12 mg/dL (ref 6–20)
CO2: 18 mmol/L — ABNORMAL LOW (ref 22–32)
Calcium: 9.3 mg/dL (ref 8.9–10.3)
Chloride: 102 mmol/L (ref 98–111)
Creatinine: 0.69 mg/dL (ref 0.44–1.00)
GFR, Estimated: >60 mL/min
Glucose, Bld: 89 mg/dL (ref 70–99)
Potassium: 4.3 mmol/L (ref 3.5–5.1)
Sodium: 132 mmol/L — ABNORMAL LOW (ref 135–145)
Total Bilirubin: 0.4 mg/dL (ref 0.0–1.2)
Total Protein: 7.7 g/dL (ref 6.5–8.1)

## 2024-09-19 LAB — PREALBUMIN: Prealbumin: 10 mg/dL — ABNORMAL LOW (ref 18–38)

## 2024-09-19 MED ORDER — DEXTROSE 5 % IV SOLN: INTRAVENOUS | Status: DC

## 2024-09-19 MED ORDER — DOXORUBICIN HCL LIPOSOMAL CHEMO INJECTION 2 MG/ML
45.0000 mg/m2 | Freq: Once | INTRAVENOUS | Status: AC
  Administered 2024-09-19: 80 mg via INTRAVENOUS
  Filled 2024-09-19: qty 30

## 2024-09-19 MED ORDER — SULFAMETHOXAZOLE-TRIMETHOPRIM 800-160 MG PO TABS: 1.0000 | ORAL_TABLET | Freq: Every day | ORAL | 2 refills | Status: AC

## 2024-09-19 MED ORDER — DEXAMETHASONE 4 MG PO TABS: 8.0000 mg | ORAL_TABLET | Freq: Every day | ORAL | 4 refills | Status: AC

## 2024-09-19 MED ORDER — ZOLEDRONIC ACID 4 MG/100ML IV SOLN
4.0000 mg | Freq: Once | INTRAVENOUS | Status: AC
  Administered 2024-09-19: 4 mg via INTRAVENOUS
  Filled 2024-09-19: qty 100

## 2024-09-19 MED ORDER — DEXAMETHASONE SOD PHOSPHATE PF 10 MG/ML IJ SOLN
10.0000 mg | Freq: Once | INTRAMUSCULAR | Status: AC
  Administered 2024-09-19: 10 mg via INTRAVENOUS
  Filled 2024-09-19: qty 1

## 2024-09-19 NOTE — Progress Notes (Signed)
 Ok to treat with HR of 113 per Lauraine Pepper. Norleen JAYSON Sou, [facility] 09/19/24 11:46 AM

## 2024-09-19 NOTE — Patient Instructions (Signed)
 [facility] - A DEPT OF [facility]  Discharge Instructions: Thank you for choosing [facility] to provide your oncology and hematology care.   If you have a lab appointment with the Cancer Center, please go directly to the Cancer Center and check in at the registration area.  Wear comfortable clothing and clothing appropriate for easy access to any Portacath or PICC line.   We strive to give you quality time with your provider. You may need to reschedule your appointment if you arrive late (15 or more minutes).  Arriving late affects you and other patients whose appointments are after yours.  Also, if you miss three or more appointments without notifying the office, you may be dismissed from the clinic at the provider's discretion.      For prescription refill requests, have your pharmacy contact our office and allow 72 hours for refills to be completed.    Today you received the following chemotherapy and/or immunotherapy agents Trastuzumab  Deruxtecan       To help prevent nausea and vomiting after your treatment, we encourage you to take your nausea medication as directed.  BELOW ARE SYMPTOMS THAT SHOULD BE REPORTED IMMEDIATELY: *FEVER GREATER THAN 100.4 F (38 C) OR HIGHER *CHILLS OR SWEATING *NAUSEA AND VOMITING THAT IS NOT CONTROLLED WITH YOUR NAUSEA MEDICATION *UNUSUAL SHORTNESS OF BREATH *UNUSUAL BRUISING OR BLEEDING *URINARY PROBLEMS (pain or burning when urinating, or frequent urination) *BOWEL PROBLEMS (unusual diarrhea, constipation, pain near the anus) TENDERNESS IN MOUTH AND THROAT WITH OR WITHOUT PRESENCE OF ULCERS (sore throat, sores in mouth, or a toothache) UNUSUAL RASH, SWELLING OR PAIN  UNUSUAL VAGINAL DISCHARGE OR ITCHING   Items with * indicate a potential emergency and should be followed up as soon as possible or go to the Emergency Department if any problems should occur.  Please show the CHEMOTHERAPY ALERT CARD or  IMMUNOTHERAPY ALERT CARD at check-in to the Emergency Department and triage nurse. Should you have questions after your visit or need to cancel or reschedule your appointment, please contact [facility] - A DEPT OF [facility]  [phone] and follow the prompts.  Office hours are 8:00 a.m. to 4:30 p.m. Monday - Friday. Please note that voicemails left after 4:00 p.m. may not be returned until the following business day.  We are closed weekends and major holidays. You have access to a nurse at all times for urgent questions. Please call the main number to the clinic [phone] and follow the prompts.  For any non-urgent questions, you may also contact your provider using MyChart. We now offer e-Visits for anyone 13 and older to request care online for non-urgent symptoms. For details visit [email or web address].   Also download the MyChart app! Go to the app store, search MyChart, open the app, select [place], and log in with your MyChart username and password.

## 2024-09-19 NOTE — Progress Notes (Signed)
 " Hematology and Oncology Follow Up Visit  Briana [name] [id number] 08-21-1976 48 y.o. 09/19/2024   Principle Diagnosis:  Stage IIIC (T4N3M0) - TRIPLE NEGATIVE - infiltrating ductal carcinoma of the left breast -metastatic to bone -- HER2 2+ (IHC) HIV (+) Iron  deficiency anemia  Past Therapy: Neoadjuvant  chemoimmunotherapy --- carboplatin /Taxol /pembrolizumab  ---s/p cycle #3 - start on 02/19/2023 S/p LEFT MRM - 06/08/2023 --  Residual breast cancer - 3/3 nodes (-) XRT -- Start on 07/22/2023 --completed on 09/15/2023 Xeloda  - s/p cycle #3 on 09/28/2023 Enhertu  5.4 mg/m IV every 3 weeks-s/p cycle #6  -- start on 01/13/2024 Radiotherapy to multiple sites-completed on 01/21/2024   Current Therapy:        Doxil  - started 09/19/2024 Zometa  4 mg IV every 3 months-next dose to be given on 11/2024 IV iron  as indicated   Interim History:  [name] is here today with her mother for follow-up and start new treatment with Doxil .  She notes that her GERD has imrpoved on Pepcid  BID.  No fever, chills, n/v, cough, rash, dizziness, SOB, chest pain, palpitations, abdominal pan or changes in bowel or bladder habits.  She has not noted any blood loss.  No swelling, tenderness, numbness or tingling in her extremities at this time.  No falls or syncope reported  Appetite and hydration are improved on Megace . Weight is stable at 126 lbs (previously 127 lbs).   ECOG Performance Status: 1 - Symptomatic but completely ambulatory  Medications:  Allergies as of 09/19/2024       Reactions   Pumpkin Flavoring Agent (non-screening) Swelling   ONLY Lip swelling after eating pumpkin pie        Medication List        Accurate as of September 19, 2024 10:56 AM. If you have any questions, ask your nurse or doctor.          amLODipine  10 MG tablet Commonly known as: NORVASC  TAKE 1 TABLET(10 MG) BY MOUTH DAILY   atorvastatin  10 MG tablet Commonly known as: Lipitor Take 1 tablet (10 mg total) by  mouth daily.   Biktarvy  50-200-25 MG Tabs tablet Generic drug: bictegravir-emtricitabine -tenofovir  AF Take 1 tablet by mouth daily.   famotidine  40 MG tablet Commonly known as: PEPCID  Take 1 tablet (40 mg total) by mouth 2 (two) times daily.   fluconazole  100 MG tablet Commonly known as: DIFLUCAN  Take 1 tablet (100 mg total) by mouth daily.   hydrOXYzine  25 MG tablet Commonly known as: ATARAX  Take 1 tablet (25 mg total) by mouth every 8 (eight) hours as needed.   megestrol  625 MG/5ML suspension Commonly known as: MEGACE  ES Take 5 mLs (625 mg total) by mouth daily.   methocarbamol  500 MG tablet Commonly known as: ROBAXIN  Take 1 tablet (500 mg total) by mouth every 8 (eight) hours as needed for muscle spasms.   oxyCODONE  20 mg 12 hr tablet Commonly known as: OXYCONTIN  Take 1 tablet (20 mg total) by mouth every 12 (twelve) hours.   Oxycodone  HCl 10 MG Tabs Take 1 tablet (10 mg total) by mouth every 6 (six) hours as needed.   polyethylene glycol 17 g packet Commonly known as: MIRALAX  / GLYCOLAX  Take 17 g by mouth daily as needed for moderate constipation.   potassium chloride  SA 20 MEQ tablet Commonly known as: KLOR-CON  M Take 1 tablet (20 mEq total) by mouth 2 (two) times daily.   senna-docusate 8.6-50 MG tablet Commonly known as: Senokot-S Take 1 tablet by mouth 2 (two) times  daily.   triamcinolone  0.025 % ointment Commonly known as: KENALOG  Apply topically 2 (two) times daily as needed.        Allergies: Allergies[1]  Past Medical History, Surgical history, Social history, and Family History were reviewed and updated.  Review of Systems: All other 10 point review of systems is negative.   Physical Exam:  oral temperature is 97.7 F (36.5 C). Her blood pressure is 124/84 and her pulse is 113 (abnormal). Her respiration is 20 and oxygen saturation is 100%.   Wt Readings from Last 3 Encounters:  08/18/24 127 lb (57.6 kg)  07/27/24 134 lb (60.8 kg)   07/27/24 131 lb 8 oz (59.6 kg)    Ocular: Sclerae unicteric, pupils equal, round and reactive to light Ear-nose-throat: Oropharynx clear, dentition fair Lymphatic: No cervical or supraclavicular adenopathy Lungs no rales or rhonchi, good excursion bilaterally Heart regular rate and rhythm, no murmur appreciated Abd soft, nontender, positive bowel sounds MSK no focal spinal tenderness, no joint edema Neuro: non-focal, well-oriented, appropriate affect Breasts: Same as above.   Lab Results  Component Value Date   WBC 9.0 09/19/2024   HGB 9.2 (L) 09/19/2024   HCT 28.5 (L) 09/19/2024   MCV 88.2 09/19/2024   PLT 282 09/19/2024   Lab Results  Component Value Date   FERRITIN 3,467 (H) 08/18/2024   IRON  18 (L) 08/18/2024   TIBC 178 (L) 08/18/2024   UIBC 160 08/18/2024   IRONPCTSAT 10 (L) 08/18/2024   Lab Results  Component Value Date   RETICCTPCT 2.2 08/18/2024   RBC 3.23 (L) 09/19/2024   No results found for: KPAFRELGTCHN, LAMBDASER, KAPLAMBRATIO No results found for: IGGSERUM, IGA, IGMSERUM No results found for: TOTALPROTELP, ALBUMINELP, A1GS, MARKEL EARLA BABCOCK, GAMS, MSPIKE, SPEI   Chemistry      Component Value Date/Time   NA 133 (L) 08/18/2024 1031   K 3.1 (L) 08/18/2024 1031   CL 99 08/18/2024 1031   CO2 18 (L) 08/18/2024 1031   BUN 6 08/18/2024 1031   CREATININE 0.77 08/18/2024 1031   CREATININE 0.62 10/17/2022 0947      Component Value Date/Time   CALCIUM  8.4 (L) 08/18/2024 1031   ALKPHOS 161 (H) 08/18/2024 1031   AST 26 08/18/2024 1031   ALT 6 08/18/2024 1031   BILITOT 0.6 08/18/2024 1031       Impression and Plan: [name] is a very nice 49 year old premenopausal African American female with TRIPLE NEGATIVE breast cancer.  She has aggressive disease and has already recurred. She has recurred in her bones and in her lymph nodes. We did get the lymph node biopsy and her cancer was CD30 positive.   Serum tumor markers are  pending.  We will now proceed with her first cycle of Doxil  as planned. Her last ECHO was in May 2025. OK to proceed per MD today and repeat ECHO next week.  We will have her start Decadron  8 mg PO daily x 3 days starting day after chemo as well as daily Bactrim  DS as infection prophylaxis.  We will plan to repeat scans after 3 cycles of treatment.  Follow-up in 4 weeks.   Lauraine Pepper, NP 1/19/202610:56 AM     [1]  Allergies Allergen Reactions   Pumpkin Flavoring Agent (Non-Screening) Swelling    ONLY Lip swelling after eating pumpkin pie   "

## 2024-09-19 NOTE — Progress Notes (Signed)
 Pharmacist Chemotherapy Monitoring - Initial Assessment    Anticipated start date: 09/19/2024   The following has been reviewed per standard work regarding the patient's treatment regimen: The patient's diagnosis, treatment plan and drug doses, and organ/hematologic function Lab orders and baseline tests specific to treatment regimen  The treatment plan start date, drug sequencing, and pre-medications Prior authorization status  Patient's documented medication list, including drug-drug interaction screen and prescriptions for anti-emetics and supportive care specific to the treatment regimen The drug concentrations, fluid compatibility, administration routes, and timing of the medications to be used The patient's access for treatment and lifetime cumulative dose history, if applicable  The patient's medication allergies and previous infusion related reactions, if applicable   Changes made to treatment plan:  N/A  Follow up needed:  N/A   Briana [name], RPH, 09/19/2024  11:45 AM

## 2024-09-19 NOTE — Patient Instructions (Signed)
 [facility] - A DEPT OF [facility]  Discharge Instructions: Thank you for choosing  Cancer Center to provide your oncology and hematology care.   If you have a lab appointment with the Cancer Center, please go directly to the Cancer Center and check in at the registration area.  Wear comfortable clothing and clothing appropriate for easy access to any Portacath or PICC line.   We strive to give you quality time with your provider. You may need to reschedule your appointment if you arrive late (15 or more minutes).  Arriving late affects you and other patients whose appointments are after yours.  Also, if you miss three or more appointments without notifying the office, you may be dismissed from the clinic at the providers discretion.      For prescription refill requests, have your pharmacy contact our office and allow 72 hours for refills to be completed.    Today you received the following chemotherapy and/or immunotherapy agents Doxil , Zometa       To help prevent nausea and vomiting after your treatment, we encourage you to take your nausea medication as directed.  BELOW ARE SYMPTOMS THAT SHOULD BE REPORTED IMMEDIATELY: *FEVER GREATER THAN 100.4 F (38 C) OR HIGHER *CHILLS OR SWEATING *NAUSEA AND VOMITING THAT IS NOT CONTROLLED WITH YOUR NAUSEA MEDICATION *UNUSUAL SHORTNESS OF BREATH *UNUSUAL BRUISING OR BLEEDING *URINARY PROBLEMS (pain or burning when urinating, or frequent urination) *BOWEL PROBLEMS (unusual diarrhea, constipation, pain near the anus) TENDERNESS IN MOUTH AND THROAT WITH OR WITHOUT PRESENCE OF ULCERS (sore throat, sores in mouth, or a toothache) UNUSUAL RASH, SWELLING OR PAIN  UNUSUAL VAGINAL DISCHARGE OR ITCHING   Items with * indicate a potential emergency and should be followed up as soon as possible or go to the Emergency Department if any problems should occur.  Please show the CHEMOTHERAPY ALERT CARD or  IMMUNOTHERAPY ALERT CARD at check-in to the Emergency Department and triage nurse. Should you have questions after your visit or need to cancel or reschedule your appointment, please contact [facility] - A DEPT OF [facility]  [phone] and follow the prompts.  Office hours are 8:00 a.m. to 4:30 p.m. Monday - Friday. Please note that voicemails left after 4:00 p.m. may not be returned until the following business day.  We are closed weekends and major holidays. You have access to a nurse at all times for urgent questions. Please call the main number to the clinic [phone] and follow the prompts.  For any non-urgent questions, you may also contact your provider using MyChart. We now offer e-Visits for anyone 3 and older to request care online for non-urgent symptoms. For details visit [email or web address].   Also download the MyChart app! Go to the app store, search MyChart, open the app, select , and log in with your MyChart username and password.

## 2024-09-20 ENCOUNTER — Telehealth: Payer: Self-pay | Admitting: Surgery

## 2024-09-20 LAB — FERRITIN: Ferritin: 4942 ng/mL — ABNORMAL HIGH (ref 11–307)

## 2024-09-20 LAB — CANCER ANTIGEN 27.29: CA 27.29: 23 U/mL (ref 0.0–38.6)

## 2024-09-20 NOTE — Telephone Encounter (Signed)
 Call attempt for chemo f/u. LVM to call back with any questions or concerns.

## 2024-09-20 NOTE — Telephone Encounter (Signed)
 Called patient for chemo follow up. Patient states she is doing well. Denies CINV, diarrhea or constipation. Denies appetite issues but states she is still taking Megace  daily. Reinforced education on medications to take post chemo (Decadron  and Bactrim ). Patient had no other concerns at this time.

## 2024-10-13 ENCOUNTER — Ambulatory Visit (HOSPITAL_COMMUNITY)

## 2024-10-17 ENCOUNTER — Inpatient Hospital Stay: Admitting: Hematology & Oncology

## 2024-10-17 ENCOUNTER — Inpatient Hospital Stay: Attending: Hematology & Oncology

## 2024-10-17 ENCOUNTER — Inpatient Hospital Stay

## 2024-11-14 ENCOUNTER — Inpatient Hospital Stay

## 2024-11-14 ENCOUNTER — Inpatient Hospital Stay: Admitting: Hematology & Oncology

## 2024-11-14 ENCOUNTER — Inpatient Hospital Stay: Attending: Hematology & Oncology

## 2024-12-12 ENCOUNTER — Inpatient Hospital Stay

## 2024-12-12 ENCOUNTER — Inpatient Hospital Stay: Admitting: Hematology & Oncology

## 2024-12-21 ENCOUNTER — Ambulatory Visit: Admitting: Infectious Diseases
